# Patient Record
Sex: Male | Born: 1950 | Race: White | Hispanic: No | Marital: Married | Smoking: Former smoker
Health system: Southern US, Community
[De-identification: ages and names within clinical notes are randomized; demographics above are authoritative.]

## PROBLEM LIST (undated history)

## (undated) DIAGNOSIS — E785 Hyperlipidemia, unspecified: Secondary | ICD-10-CM

## (undated) DIAGNOSIS — E039 Hypothyroidism, unspecified: Secondary | ICD-10-CM

## (undated) DIAGNOSIS — Z9289 Personal history of other medical treatment: Secondary | ICD-10-CM

## (undated) DIAGNOSIS — I739 Peripheral vascular disease, unspecified: Secondary | ICD-10-CM

## (undated) DIAGNOSIS — G250 Essential tremor: Secondary | ICD-10-CM

## (undated) DIAGNOSIS — R51 Headache: Secondary | ICD-10-CM

## (undated) DIAGNOSIS — Z87891 Personal history of nicotine dependence: Secondary | ICD-10-CM

## (undated) DIAGNOSIS — Z973 Presence of spectacles and contact lenses: Secondary | ICD-10-CM

## (undated) DIAGNOSIS — R112 Nausea with vomiting, unspecified: Secondary | ICD-10-CM

## (undated) DIAGNOSIS — R519 Headache, unspecified: Secondary | ICD-10-CM

## (undated) DIAGNOSIS — I25810 Atherosclerosis of coronary artery bypass graft(s) without angina pectoris: Secondary | ICD-10-CM

## (undated) DIAGNOSIS — I219 Acute myocardial infarction, unspecified: Secondary | ICD-10-CM

## (undated) DIAGNOSIS — T7840XA Allergy, unspecified, initial encounter: Secondary | ICD-10-CM

## (undated) DIAGNOSIS — K635 Polyp of colon: Secondary | ICD-10-CM

## (undated) DIAGNOSIS — Z9889 Other specified postprocedural states: Secondary | ICD-10-CM

## (undated) DIAGNOSIS — R233 Spontaneous ecchymoses: Secondary | ICD-10-CM

## (undated) DIAGNOSIS — F32A Depression, unspecified: Secondary | ICD-10-CM

## (undated) DIAGNOSIS — K219 Gastro-esophageal reflux disease without esophagitis: Secondary | ICD-10-CM

## (undated) DIAGNOSIS — F419 Anxiety disorder, unspecified: Secondary | ICD-10-CM

## (undated) DIAGNOSIS — J449 Chronic obstructive pulmonary disease, unspecified: Secondary | ICD-10-CM

## (undated) DIAGNOSIS — J439 Emphysema, unspecified: Secondary | ICD-10-CM

## (undated) DIAGNOSIS — M1990 Unspecified osteoarthritis, unspecified site: Secondary | ICD-10-CM

## (undated) DIAGNOSIS — D472 Monoclonal gammopathy: Secondary | ICD-10-CM

## (undated) DIAGNOSIS — N189 Chronic kidney disease, unspecified: Secondary | ICD-10-CM

## (undated) DIAGNOSIS — R238 Other skin changes: Secondary | ICD-10-CM

## (undated) DIAGNOSIS — M519 Unspecified thoracic, thoracolumbar and lumbosacral intervertebral disc disorder: Secondary | ICD-10-CM

## (undated) DIAGNOSIS — N390 Urinary tract infection, site not specified: Secondary | ICD-10-CM

## (undated) DIAGNOSIS — Z8619 Personal history of other infectious and parasitic diseases: Secondary | ICD-10-CM

## (undated) DIAGNOSIS — I10 Essential (primary) hypertension: Secondary | ICD-10-CM

## (undated) HISTORY — DX: Emphysema, unspecified: J43.9

## (undated) HISTORY — DX: Acute myocardial infarction, unspecified: I21.9

## (undated) HISTORY — PX: OTHER SURGICAL HISTORY: SHX169

## (undated) HISTORY — DX: Headache, unspecified: R51.9

## (undated) HISTORY — PX: TONSILLECTOMY: SUR1361

## (undated) HISTORY — DX: Chronic obstructive pulmonary disease, unspecified: J44.9

## (undated) HISTORY — DX: Unspecified thoracic, thoracolumbar and lumbosacral intervertebral disc disorder: M51.9

## (undated) HISTORY — PX: KNEE RECONSTRUCTION: SHX5883

## (undated) HISTORY — DX: Essential (primary) hypertension: I10

## (undated) HISTORY — PX: LUMBAR DISC SURGERY: SHX700

## (undated) HISTORY — PX: ESOPHAGOGASTRODUODENOSCOPY: SHX1529

## (undated) HISTORY — DX: Gastro-esophageal reflux disease without esophagitis: K21.9

## (undated) HISTORY — DX: Allergy, unspecified, initial encounter: T78.40XA

## (undated) HISTORY — PX: PATELLA FRACTURE SURGERY: SHX735

## (undated) HISTORY — DX: Urinary tract infection, site not specified: N39.0

## (undated) HISTORY — DX: Polyp of colon: K63.5

## (undated) HISTORY — DX: Personal history of nicotine dependence: Z87.891

## (undated) HISTORY — DX: Atherosclerosis of coronary artery bypass graft(s) without angina pectoris: I25.810

## (undated) HISTORY — PX: BACK SURGERY: SHX140

## (undated) HISTORY — PX: LAPAROSCOPIC ABLATION RENAL MASS: SUR751

## (undated) HISTORY — DX: Monoclonal gammopathy: D47.2

## (undated) HISTORY — DX: Hyperlipidemia, unspecified: E78.5

## (undated) HISTORY — DX: Essential tremor: G25.0

## (undated) HISTORY — DX: Depression, unspecified: F32.A

## (undated) HISTORY — DX: Headache: R51

---
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
## 1997-08-22 DIAGNOSIS — Z9289 Personal history of other medical treatment: Secondary | ICD-10-CM

## 1997-08-22 DIAGNOSIS — I219 Acute myocardial infarction, unspecified: Secondary | ICD-10-CM

## 1997-08-22 HISTORY — PX: CARDIAC CATHETERIZATION: SHX172

## 1997-08-22 HISTORY — DX: Personal history of other medical treatment: Z92.89

## 1997-08-22 HISTORY — DX: Acute myocardial infarction, unspecified: I21.9

## 1997-09-12 HISTORY — PX: CORONARY ARTERY BYPASS GRAFT: SHX141

## 1999-02-14 ENCOUNTER — Other Ambulatory Visit: Payer: Self-pay

## 2012-11-22 HISTORY — PX: COLONOSCOPY: SHX174

## 2016-06-11 ENCOUNTER — Ambulatory Visit (INDEPENDENT_AMBULATORY_CARE_PROVIDER_SITE_OTHER): Payer: Medicare Other | Admitting: Neurology

## 2016-06-11 ENCOUNTER — Encounter: Payer: Self-pay | Admitting: Neurology

## 2016-06-11 VITALS — BP 120/75 | HR 60 | Ht 69.0 in | Wt 145.0 lb

## 2016-06-11 DIAGNOSIS — G44209 Tension-type headache, unspecified, not intractable: Secondary | ICD-10-CM

## 2016-06-11 DIAGNOSIS — G250 Essential tremor: Secondary | ICD-10-CM

## 2016-06-11 HISTORY — DX: Essential tremor: G25.0

## 2016-06-11 MED ORDER — TOPIRAMATE 50 MG PO TABS: 50.0000 mg | ORAL_TABLET | Freq: Two times a day (BID) | ORAL | Status: DC

## 2016-06-11 NOTE — Progress Notes (Signed)
[facility] [address] [place]. [place] [address] [phone]       OFFICE CONSULT NOTE  Brian. Brian Graham Date of Birth:  [birth year] Medical Record Number:  [id number]   Referring MD:  Hilton Cork Reason for Referral:  Tremor and headaches  HPI: Brian Graham is a 67 year occasion male who is had essential tremor diagnosed 2025 years ago. He describes this as involving mainly his hands but to a lesser degree his head and neck as well. This has been quite mild over the years and hence he was never on specific medications. However he feels the last 2-3 years ago, seemed to have increased. Tremors are intermittent absent at rest. They're more noticeable with activities involving his hands like brushing his teeth, writing or cutting food. These have become severe and now interferes with his routine. He has not noticed any beneficial effect of alcohol on the tremor. Patient has seen a neurologist years ago but not recently. His never been tried on medications like Mysoline, Topamax her Inderal. Last year he has been started on atenolol for cardiac reasons but that has not helped his tremor. He denies any drooling of saliva, bradykinesia, difficulty getting out of the chair. He denies any trouble with balance or walking. Patient also complains of headaches last couple of months. The headaches are mainly towards the back of the head and spread to the side. The dull aching nature intermittent lasting for hours. There is no accompanying nausea, vomiting or light sensitivity. Tylenol does help his headaches but it returns later on. He has been taking 4-6 tablets of Tylenol every day for the last 1 month. He has not had any recent brain imaging study done on scene and neurologist.  ROS:   14 system review of systems is positive for  weight loss, fatigue, cough, easy bruising and bleeding, aching muscles, allergies, headache, dizziness, tremor, decreased energy, change in appetite,  disinterest in activities and all other systems negative PMH:  Past Medical History  Diagnosis Date  . Headache   . Myocardial infarction ([place])   . Lumbar disc disease   . Rhinitis   . COPD (chronic obstructive pulmonary disease) ([place])   . GERD (gastroesophageal reflux disease)   . Abnormal ECG   . Coronary artery disease   . Weight loss   . Colon polyp   . Hyperlipemia     Social History:  Social History   Social History  . Marital Status: Married    Spouse Name: N/A  . Number of Children: N/A  . Years of Education: N/A   Occupational History  . Not on file.   Social History Main Topics  . Smoking status: Current Every Day Smoker -- 0.25 packs/day  . Smokeless tobacco: Not on file  . Alcohol Use: 0.6 oz/week    1 Cans of beer per week     Comment: socially  . Drug Use: Not on file  . Sexual Activity: Not on file   Other Topics Concern  . Not on file   Social History Narrative  . No narrative on file    Medications:   No current outpatient prescriptions on file prior to visit.   No current facility-administered medications on file prior to visit.    Allergies:   Allergies  Allergen Reactions  . Omeprazole Diarrhea    Physical Exam General: well developed, well nourished middle aged caucasian male, seated, in no evident distress Head: head normocephalic and atraumatic.   Neck: supple  with no carotid or supraclavicular bruits Cardiovascular: regular rate and rhythm, no murmurs Musculoskeletal: no deformity Skin:  no rash/petichiae Vascular:  Normal pulses all extremities  Neurologic Exam Mental Status: Awake and fully alert. Oriented to place and time. Recent and remote memory intact. Attention span, concentration and fund of knowledge appropriate. Mood and affect appropriate.  Cranial Nerves: Fundoscopic exam reveals sharp disc margins. Pupils equal, briskly reactive to light. Extraocular movements full without nystagmus. Visual fields full to  confrontation. Hearing intact. Facial sensation intact. Face, tongue, palate moves normally and symmetrically.  Motor: Normal bulk and tone. Normal strength in all tested extremity muscles. Sensory.: intact to touch , pinprick , position and vibratory sensation.  Coordination: Rapid alternating movements normal in all extremities. Finger-to-nose and heel-to-shin performed accurately bilaterally. Gait and Station: Arises from chair without difficulty. Stance is normal. Gait demonstrates normal stride length and balance . Able to heel, toe and tandem walk without difficulty.  Reflexes: 1+ and symmetric. Toes downgoing.       ASSESSMENT: 65 year old Caucasian male with long-standing history of benign essential tremor with recent worsening as well as new onset mild chronic daily headaches likely muscle tension headaches with analgesic rebound    PLAN: I had a long discussion with the patient and his wife regarding his essential head and hand tremor which seems to have worsened recently and is functionally disabling. I recommend trial of Topamax start 50 mg with food at night for 1 week and then increase to twice daily if tolerated. I have discussed possible side effects and asked him to call me. I also encouraged him to discuss with pulmonologist whether he can reduce or stop some of his inhalers which may also be contributing to his tremors. I have advised him to discontinue daily tylenol which he  is taking daily for his tension headaches to diminish analgesic rebound component. Topamax  will hopefully help his headache as well. I encouraged him to do regular neck stretching exercises. He we will check TSH, 123456,: ,metabolic panel labs and check MRI scan of the brain with and without contrast. He was advised to return for follow-up with me in 2 months or call earlier if necessary. Antony Contras, MD  [facility] [address] [place], [place] [address]  Phone  [phone] Fax [phone]  Note: This document was prepared with digital dictation and possible smart phrase technology. Any transcriptional errors that result from this process are unintentional.

## 2016-06-11 NOTE — Patient Instructions (Addendum)
I had a long discussion with the patient and his wife regarding his essential head and hand tremor which seems to have worsened recently and is functionally disabling. I recommend trial of Topamax start 50 mg with food at night for 1 week and then increase to twice daily if tolerated. I have discussed possible side effects and asked him to call me. I also encouraged him to discuss with pulmonologist whether he can reduce or stop some of his inhalers which may also be contributing to his tremors. I have advised him to discontinue daily tylenol which he  is taking daily for his tension headaches to diminish analgesic rebound component. Topamax  will hopefully help his headache as well. I encouraged him to do regular neck stretching exercises. He we will check TSH, 123456,: ,metabolic panel labs and check MRI scan of the brain with and without contrast. He was advised to return for follow-up with me in 2 months or call earlier if necessary.  Essential Tremor A tremor is trembling or shaking that you cannot control. Most tremors affect the hands or arms. Tremors can also affect the head, vocal cords, face, and other parts of the body.  Essential tremor is a tremor without a known cause.  CAUSES Essential tremor has no known cause.  RISK FACTORS You may be at greater risk of essential tremor if:   You have a family member with essential tremor.   You are age 66 or older.   You take certain medicines. SIGNS AND SYMPTOMS The main sign of a tremor is uncontrolled and unintentional rhythmic shaking of a body part.  You may have difficulty eating with a spoon or fork.   You may have difficulty writing.   You may nod your head up and down or side to side.   You may have a quivering voice.  Your tremors:  May get worse over time.   May come and go.   May be more noticeable on one side of your body.   May get worse due to stress, fatigue, caffeine, and extreme heat or cold.   DIAGNOSIS Your health care provider can diagnose essential tremor based on your symptoms, medical history, and a physical examination. There is no single test to diagnose an essential tremor. However, your health care provider may perform a variety of tests to rule out other conditions. Tests may include:   Blood and urine tests.   Imaging studies of your brain, such as:   CT scan.   MRI.   A test that measures involuntary muscle movement (electromyogram). TREATMENT Your tremors may go away without treatment. Mild tremors may not need treatment if they do not affect your day-to-day life. Severe tremors may need to be treated using one or a combination of the following options:   Medicines. This may include medicine that is injected.  Lifestyle changes.   Physical therapy.  HOME CARE INSTRUCTIONS  Take medicines only as directed by your health care provider.   Limit alcohol intake to no more than 1 drink per day for nonpregnant women and 2 drinks per day for men. One drink equals 12 oz of beer, 5 oz of wine, or 1 oz of hard liquor.  Do not use any tobacco products, including cigarettes, chewing tobacco, or electronic cigarettes. If you need help quitting, ask your health care provider.  Take medicines only as directed by your health care provider.   Avoid extreme heat or cold.   Limit the amount of caffeine you  consumeas directed by your health care provider.   Try to get eight hours of sleep each night.  Find ways to manage your stress, such as meditation or yoga.  Keep all follow-up visits as directed by your health care provider. This is important. This includes any physical therapy visits. SEEK MEDICAL CARE IF:  You experience any changes in the location or intensity of your tremors.   You start having a tremor after starting a new medicine.   You have tremor with other symptoms such as:   Numbness.   Tingling.   Pain.   Weakness.    Your tremor gets worse.   Your tremor interferes with your daily life.    This information is not intended to replace advice given to you by your health care provider. Make sure you discuss any questions you have with your health care provider.   Document Released: 11/29/2014 Document Reviewed: 11/29/2014 Elsevier Interactive Patient Education [employer].

## 2016-06-12 LAB — COMPREHENSIVE METABOLIC PANEL
ALBUMIN: 4.4 g/dL (ref 3.6–4.8)
ALT: 18 IU/L (ref 0–44)
AST: 17 IU/L (ref 0–40)
Albumin/Globulin Ratio: 1.8 (ref 1.2–2.2)
Alkaline Phosphatase: 71 IU/L (ref 39–117)
BILIRUBIN TOTAL: 0.9 mg/dL (ref 0.0–1.2)
BUN / CREAT RATIO: 15 (ref 10–24)
BUN: 11 mg/dL (ref 8–27)
CO2: 26 mmol/L (ref 18–29)
CREATININE: 0.73 mg/dL — AB (ref 0.76–1.27)
Calcium: 9.4 mg/dL (ref 8.6–10.2)
Chloride: 97 mmol/L (ref 96–106)
GFR calc Af Amer: 113 mL/min/{1.73_m2} (ref >59)
GFR, EST NON AFRICAN AMERICAN: 97 mL/min/{1.73_m2} (ref >59)
GLUCOSE: 91 mg/dL (ref 65–99)
Globulin, Total: 2.5 g/dL (ref 1.5–4.5)
Potassium: 4.9 mmol/L (ref 3.5–5.2)
SODIUM: 140 mmol/L (ref 134–144)
Total Protein: 6.9 g/dL (ref 6.0–8.5)

## 2016-06-12 LAB — VITAMIN B12: Vitamin B-12: 234 pg/mL (ref 211–946)

## 2016-06-12 LAB — TSH: TSH: 1.71 u[IU]/mL (ref 0.450–4.500)

## 2016-06-17 ENCOUNTER — Telehealth: Payer: Self-pay | Admitting: Neurology

## 2016-06-17 NOTE — Telephone Encounter (Signed)
RN call patient back about the topamax requiring PA. Rn stated usually it does not require a PA because its a first line drug. PT verbalized understanding. Rn stated it takes 3 to 5 business days for a PA to get approve or denied. Pt verbalized understanding.

## 2016-06-17 NOTE — Telephone Encounter (Signed)
PA done on cover my meds for topamax. Key is [id number]. The response time for approval or denial is 3 to 5 business days. PA is pending.

## 2016-06-17 NOTE — Telephone Encounter (Signed)
Patient called, states [facility] has advised Prior Brian Graham is required for TOPAMAX or Dr. Leonie Man will need to prescribe something cheaper that is equivalent. Patient also inquired about open MRI appointment, skyped Danielle who advised she will send this to [facility] for scheduling, advised patient of this and there is a 7-10 day turn around.

## 2016-06-18 NOTE — Telephone Encounter (Signed)
Rn call patient to let him know that the topamax was approve. Pt inquired about open mri. Rn stated a message will be sent to Danielle to get in contact with him.

## 2016-06-18 NOTE — Telephone Encounter (Signed)
I called [facility] PA dept. [phone] spoke to [facility].  PA # [id number] Good from 05-19-16 thru 06-18-2019.

## 2016-06-23 NOTE — Telephone Encounter (Signed)
Patient has been scheduled at GI.

## 2016-06-30 ENCOUNTER — Ambulatory Visit
Admission: RE | Admit: 2016-06-30 | Discharge: 2016-06-30 | Disposition: A | Discharge status: Home / Self Care | Payer: Medicare Other | Source: Ambulatory Visit | Attending: Neurology | Admitting: Neurology

## 2016-06-30 DIAGNOSIS — G250 Essential tremor: Secondary | ICD-10-CM

## 2016-06-30 DIAGNOSIS — G44209 Tension-type headache, unspecified, not intractable: Secondary | ICD-10-CM

## 2016-06-30 MED ORDER — GADOBENATE DIMEGLUMINE 529 MG/ML IV SOLN
13.0000 mL | Freq: Once | INTRAVENOUS | Status: AC | PRN
  Administered 2016-06-30: 13 mL via INTRAVENOUS

## 2016-07-05 ENCOUNTER — Other Ambulatory Visit: Payer: Self-pay | Admitting: Neurology

## 2016-07-05 ENCOUNTER — Telehealth: Payer: Self-pay | Admitting: Neurology

## 2016-07-05 DIAGNOSIS — G250 Essential tremor: Secondary | ICD-10-CM

## 2016-07-05 MED ORDER — PRIMIDONE 250 MG PO TABS: 125.0000 mg | ORAL_TABLET | Freq: Two times a day (BID) | ORAL | 3 refills | Status: DC

## 2016-07-05 NOTE — Telephone Encounter (Signed)
Spoke with Dr.Sethi about pt having side effects of topamax. Pt has been on meds for less than a month. Dr.Sethi reviewed the chart and stated pt needs to stop taking medication for a week and call back in a week. Pt needs to call back in a week to see if the topamax is causing the sympts.This is per Dr.Sethi advice.

## 2016-07-05 NOTE — Telephone Encounter (Signed)
Rn call patient back about the topamax side effects. Pt stated he took for 5 days. While taking the meds he had blurred vision and dizzy spells. Pt is still having the tremors in both his hands. Pt stop taking the medication  3 days ago. Pt stated the blurred vision with the dizzy spells have resolve. Rn stated Dr. Leonie Man will be notified if he wants to prescribed another medication. Pt verbalized understanding.

## 2016-07-05 NOTE — Telephone Encounter (Signed)
Patient called to advise, he's having difficulty with medication topiramate (TOPAMAX) 50 MG tablet, severe dizziness, vision problems. Please call [phone].

## 2016-07-05 NOTE — Telephone Encounter (Signed)
Rn call patient back about Dr.Sethi prescribing Mysoline. Rn stated the medication was sent [facility]. Pt stated it was okay to be [facility]. Rn stated the directions are to take 1/2 for two weeks bid.and after two weeks take 1 tablet twice a day. Pt verbalized understanding.

## 2016-07-05 NOTE — Telephone Encounter (Signed)
Okay to discontinue Topamax since is having side effects. I will prescribe Mysoline instead

## 2016-07-12 ENCOUNTER — Telehealth: Payer: Self-pay

## 2016-07-12 NOTE — Telephone Encounter (Signed)
Garvin Fila, MD  Marval Regal, RN        MRI scan of brain shows only mild nonspecific changes of age related hardening of arteries. No new or worrisome findings

## 2016-07-14 NOTE — Telephone Encounter (Signed)
Rn call patient to let him know that the MRI brain showed mild non specific changes of age related hardening of the arteries. No new or worrisome findings. Pt verbalized understanding.

## 2016-08-05 ENCOUNTER — Encounter (HOSPITAL_COMMUNITY): Payer: Self-pay

## 2016-08-05 ENCOUNTER — Emergency Department (HOSPITAL_COMMUNITY): Payer: Medicare Other

## 2016-08-05 ENCOUNTER — Observation Stay (HOSPITAL_COMMUNITY)
Admission: EM | Admit: 2016-08-05 | Discharge: 2016-08-06 | Disposition: A | Discharge status: Home / Self Care | Payer: Medicare Other | Attending: Student in an Organized Health Care Education/Training Program | Admitting: Student in an Organized Health Care Education/Training Program

## 2016-08-05 DIAGNOSIS — F17200 Nicotine dependence, unspecified, uncomplicated: Secondary | ICD-10-CM | POA: Diagnosis not present

## 2016-08-05 DIAGNOSIS — J441 Chronic obstructive pulmonary disease with (acute) exacerbation: Principal | ICD-10-CM | POA: Diagnosis present

## 2016-08-05 DIAGNOSIS — Z7982 Long term (current) use of aspirin: Secondary | ICD-10-CM | POA: Diagnosis not present

## 2016-08-05 DIAGNOSIS — I10 Essential (primary) hypertension: Secondary | ICD-10-CM

## 2016-08-05 DIAGNOSIS — Z79899 Other long term (current) drug therapy: Secondary | ICD-10-CM | POA: Insufficient documentation

## 2016-08-05 DIAGNOSIS — E039 Hypothyroidism, unspecified: Secondary | ICD-10-CM | POA: Diagnosis not present

## 2016-08-05 DIAGNOSIS — R002 Palpitations: Secondary | ICD-10-CM

## 2016-08-05 DIAGNOSIS — K219 Gastro-esophageal reflux disease without esophagitis: Secondary | ICD-10-CM | POA: Insufficient documentation

## 2016-08-05 DIAGNOSIS — I25810 Atherosclerosis of coronary artery bypass graft(s) without angina pectoris: Secondary | ICD-10-CM | POA: Diagnosis present

## 2016-08-05 DIAGNOSIS — J069 Acute upper respiratory infection, unspecified: Secondary | ICD-10-CM | POA: Diagnosis not present

## 2016-08-05 DIAGNOSIS — R0789 Other chest pain: Secondary | ICD-10-CM | POA: Diagnosis not present

## 2016-08-05 DIAGNOSIS — Z7951 Long term (current) use of inhaled steroids: Secondary | ICD-10-CM | POA: Diagnosis not present

## 2016-08-05 DIAGNOSIS — I252 Old myocardial infarction: Secondary | ICD-10-CM | POA: Diagnosis not present

## 2016-08-05 DIAGNOSIS — I499 Cardiac arrhythmia, unspecified: Secondary | ICD-10-CM | POA: Diagnosis not present

## 2016-08-05 DIAGNOSIS — R000 Tachycardia, unspecified: Secondary | ICD-10-CM

## 2016-08-05 DIAGNOSIS — Z955 Presence of coronary angioplasty implant and graft: Secondary | ICD-10-CM | POA: Diagnosis not present

## 2016-08-05 DIAGNOSIS — Z951 Presence of aortocoronary bypass graft: Secondary | ICD-10-CM

## 2016-08-05 DIAGNOSIS — M1990 Unspecified osteoarthritis, unspecified site: Secondary | ICD-10-CM | POA: Diagnosis not present

## 2016-08-05 DIAGNOSIS — E785 Hyperlipidemia, unspecified: Secondary | ICD-10-CM | POA: Insufficient documentation

## 2016-08-05 DIAGNOSIS — G250 Essential tremor: Secondary | ICD-10-CM | POA: Diagnosis present

## 2016-08-05 HISTORY — DX: Atherosclerosis of coronary artery bypass graft(s) without angina pectoris: I25.810

## 2016-08-05 HISTORY — DX: Essential (primary) hypertension: I10

## 2016-08-05 HISTORY — DX: Unspecified osteoarthritis, unspecified site: M19.90

## 2016-08-05 HISTORY — DX: Personal history of other infectious and parasitic diseases: Z86.19

## 2016-08-05 HISTORY — DX: Personal history of other medical treatment: Z92.89

## 2016-08-05 HISTORY — DX: Hypothyroidism, unspecified: E03.9

## 2016-08-05 LAB — BRAIN NATRIURETIC PEPTIDE: B Natriuretic Peptide: 48.3 pg/mL (ref 0.0–100.0)

## 2016-08-05 LAB — CBC WITH DIFFERENTIAL/PLATELET
Basophils Absolute: 0 10*3/uL (ref 0.0–0.1)
Basophils Relative: 0 %
EOS ABS: 0.1 10*3/uL (ref 0.0–0.7)
Eosinophils Relative: 0 %
HCT: 45.3 % (ref 39.0–52.0)
HEMOGLOBIN: 15 g/dL (ref 13.0–17.0)
LYMPHS ABS: 0.9 10*3/uL (ref 0.7–4.0)
Lymphocytes Relative: 8 %
MCH: 32.1 pg (ref 26.0–34.0)
MCHC: 33.1 g/dL (ref 30.0–36.0)
MCV: 96.8 fL (ref 78.0–100.0)
MONOS PCT: 5 %
Monocytes Absolute: 0.5 10*3/uL (ref 0.1–1.0)
NEUTROS PCT: 87 %
Neutro Abs: 9.6 10*3/uL — ABNORMAL HIGH (ref 1.7–7.7)
Platelets: 232 10*3/uL (ref 150–400)
RBC: 4.68 MIL/uL (ref 4.22–5.81)
RDW: 14.7 % (ref 11.5–15.5)
WBC: 11.1 10*3/uL — ABNORMAL HIGH (ref 4.0–10.5)

## 2016-08-05 LAB — BASIC METABOLIC PANEL
Anion gap: 10 (ref 5–15)
BUN: 9 mg/dL (ref 6–20)
CHLORIDE: 103 mmol/L (ref 101–111)
CO2: 25 mmol/L (ref 22–32)
CREATININE: 0.94 mg/dL (ref 0.61–1.24)
Calcium: 9 mg/dL (ref 8.9–10.3)
GFR calc Af Amer: >60 mL/min (ref >60)
GFR calc non Af Amer: >60 mL/min (ref >60)
GLUCOSE: 130 mg/dL — AB (ref 65–99)
Potassium: 4 mmol/L (ref 3.5–5.1)
SODIUM: 138 mmol/L (ref 135–145)

## 2016-08-05 LAB — GLUCOSE, CAPILLARY: Glucose-Capillary: 240 mg/dL — ABNORMAL HIGH (ref 65–99)

## 2016-08-05 LAB — I-STAT TROPONIN, ED: TROPONIN I, POC: 0 ng/mL (ref 0.00–0.08)

## 2016-08-05 LAB — TROPONIN I: Troponin I: <0.03 ng/mL (ref <0.03)

## 2016-08-05 MED ORDER — BENAZEPRIL HCL 20 MG PO TABS
20.0000 mg | ORAL_TABLET | Freq: Every day | ORAL | Status: DC
  Administered 2016-08-06: 20 mg via ORAL
  Filled 2016-08-05: qty 1

## 2016-08-05 MED ORDER — ALBUTEROL SULFATE (2.5 MG/3ML) 0.083% IN NEBU: 3.0000 mL | INHALATION_SOLUTION | RESPIRATORY_TRACT | Status: DC | PRN

## 2016-08-05 MED ORDER — ASPIRIN EC 81 MG PO TBEC
81.0000 mg | DELAYED_RELEASE_TABLET | Freq: Every day | ORAL | Status: DC
  Administered 2016-08-05 – 2016-08-06 (×2): 81 mg via ORAL
  Filled 2016-08-05 (×2): qty 1

## 2016-08-05 MED ORDER — ENSURE ENLIVE PO LIQD
237.0000 mL | Freq: Two times a day (BID) | ORAL | Status: DC
  Administered 2016-08-06: 237 mL via ORAL

## 2016-08-05 MED ORDER — ATORVASTATIN CALCIUM 80 MG PO TABS
80.0000 mg | ORAL_TABLET | Freq: Every day | ORAL | Status: DC
  Administered 2016-08-05: 80 mg via ORAL
  Filled 2016-08-05: qty 1

## 2016-08-05 MED ORDER — ACETAMINOPHEN 650 MG RE SUPP: 650.0000 mg | Freq: Four times a day (QID) | RECTAL | Status: DC | PRN

## 2016-08-05 MED ORDER — MOMETASONE FURO-FORMOTEROL FUM 200-5 MCG/ACT IN AERO
2.0000 | INHALATION_SPRAY | Freq: Two times a day (BID) | RESPIRATORY_TRACT | Status: DC
  Administered 2016-08-05 – 2016-08-06 (×2): 2 via RESPIRATORY_TRACT
  Filled 2016-08-05 (×2): qty 8.8

## 2016-08-05 MED ORDER — ENOXAPARIN SODIUM 40 MG/0.4ML ~~LOC~~ SOLN: 40.0000 mg | SUBCUTANEOUS | Status: DC

## 2016-08-05 MED ORDER — ALBUTEROL SULFATE (2.5 MG/3ML) 0.083% IN NEBU
5.0000 mg | INHALATION_SOLUTION | Freq: Once | RESPIRATORY_TRACT | Status: AC
  Administered 2016-08-05: 2.5 mg via RESPIRATORY_TRACT
  Filled 2016-08-05: qty 6

## 2016-08-05 MED ORDER — IPRATROPIUM-ALBUTEROL 0.5-2.5 (3) MG/3ML IN SOLN
3.0000 mL | Freq: Three times a day (TID) | RESPIRATORY_TRACT | Status: DC
  Administered 2016-08-06: 3 mL via RESPIRATORY_TRACT
  Filled 2016-08-05: qty 3

## 2016-08-05 MED ORDER — ACETAMINOPHEN 325 MG PO TABS
650.0000 mg | ORAL_TABLET | Freq: Four times a day (QID) | ORAL | Status: DC | PRN
Start: 2016-08-05 — End: 2016-08-06

## 2016-08-05 MED ORDER — DOXYCYCLINE HYCLATE 100 MG PO TABS
100.0000 mg | ORAL_TABLET | Freq: Once | ORAL | Status: AC
  Administered 2016-08-05: 100 mg via ORAL
  Filled 2016-08-05: qty 1

## 2016-08-05 MED ORDER — FLUTICASONE PROPIONATE 50 MCG/ACT NA SUSP
1.0000 | Freq: Every day | NASAL | Status: DC
  Filled 2016-08-05: qty 16

## 2016-08-05 MED ORDER — GUAIFENESIN-DM 100-10 MG/5ML PO SYRP: 5.0000 mL | ORAL_SOLUTION | ORAL | Status: DC | PRN

## 2016-08-05 MED ORDER — IPRATROPIUM-ALBUTEROL 0.5-2.5 (3) MG/3ML IN SOLN
3.0000 mL | Freq: Once | RESPIRATORY_TRACT | Status: DC
  Filled 2016-08-05: qty 3

## 2016-08-05 MED ORDER — PREDNISONE 20 MG PO TABS
60.0000 mg | ORAL_TABLET | Freq: Once | ORAL | Status: AC
  Administered 2016-08-05: 60 mg via ORAL
  Filled 2016-08-05: qty 3

## 2016-08-05 MED ORDER — AZITHROMYCIN 500 MG PO TABS
500.0000 mg | ORAL_TABLET | Freq: Every day | ORAL | Status: AC
  Administered 2016-08-06: 500 mg via ORAL
  Filled 2016-08-05: qty 1

## 2016-08-05 MED ORDER — LEVOTHYROXINE SODIUM 25 MCG PO TABS
125.0000 ug | ORAL_TABLET | Freq: Every day | ORAL | Status: DC
  Administered 2016-08-06: 125 ug via ORAL
  Filled 2016-08-05: qty 1

## 2016-08-05 MED ORDER — PREDNISONE 20 MG PO TABS
40.0000 mg | ORAL_TABLET | Freq: Every day | ORAL | Status: DC
  Administered 2016-08-06: 40 mg via ORAL
  Filled 2016-08-05: qty 2

## 2016-08-05 MED ORDER — AZITHROMYCIN 500 MG PO TABS: 250.0000 mg | ORAL_TABLET | Freq: Every day | ORAL | Status: DC

## 2016-08-05 MED ORDER — IPRATROPIUM-ALBUTEROL 0.5-2.5 (3) MG/3ML IN SOLN
3.0000 mL | Freq: Four times a day (QID) | RESPIRATORY_TRACT | Status: DC
  Administered 2016-08-05: 3 mL via RESPIRATORY_TRACT
  Filled 2016-08-05: qty 3

## 2016-08-05 MED ORDER — SODIUM CHLORIDE 0.9 % IV BOLUS (SEPSIS)
1000.0000 mL | Freq: Once | INTRAVENOUS | Status: AC
  Administered 2016-08-05: 1000 mL via INTRAVENOUS

## 2016-08-05 MED ORDER — AMLODIPINE BESYLATE 5 MG PO TABS
5.0000 mg | ORAL_TABLET | Freq: Every day | ORAL | Status: DC
  Administered 2016-08-05 – 2016-08-06 (×2): 5 mg via ORAL
  Filled 2016-08-05 (×2): qty 1

## 2016-08-05 MED ORDER — ATENOLOL 50 MG PO TABS
50.0000 mg | ORAL_TABLET | Freq: Every day | ORAL | Status: DC
  Administered 2016-08-06: 50 mg via ORAL
  Filled 2016-08-05: qty 1

## 2016-08-05 MED ORDER — TIOTROPIUM BROMIDE MONOHYDRATE 18 MCG IN CAPS: 18.0000 ug | ORAL_CAPSULE | Freq: Every day | RESPIRATORY_TRACT | Status: DC

## 2016-08-05 MED ORDER — HYDROCODONE-ACETAMINOPHEN 5-325 MG PO TABS
1.0000 | ORAL_TABLET | Freq: Once | ORAL | Status: AC
  Administered 2016-08-05: 1 via ORAL
  Filled 2016-08-05: qty 1

## 2016-08-05 MED ORDER — ACETAMINOPHEN 500 MG PO TABS: 1000.0000 mg | ORAL_TABLET | Freq: Four times a day (QID) | ORAL | Status: DC | PRN

## 2016-08-05 MED ORDER — GUAIFENESIN ER 600 MG PO TB12
600.0000 mg | ORAL_TABLET | Freq: Two times a day (BID) | ORAL | Status: DC
  Filled 2016-08-05 (×2): qty 1

## 2016-08-05 NOTE — ED Provider Notes (Signed)
[facility] DEPT Provider Note   CSN: [id number] Arrival date & time: 08/05/16  0950     History   Chief Complaint Chief Complaint  Patient presents with  . Shortness of Breath    HPI Brian Graham is a 65 y.o. male.  The history is provided by the patient.  Shortness of Breath  This is a new problem. The problem occurs continuously.The current episode started 12 to 24 hours ago. Associated symptoms include a fever (subjective) and cough. Pertinent negatives include no chest pain, no vomiting, no abdominal pain and no leg swelling.    65 year old male who presents with cough and shortness of breath. He has history of COPD, hypertension, hyperlipidemia, CAD status post CABG. Gradual onset of cold like symptoms 3 days ago, including stuffy head, dry throat, and congestion. Increased sinus drainage, cough and shortness of breath yesterday. Last night coughing up thick mucous, and with subjective fever, chills and night sweats. Overnight with increased dyspnea, and used albuterol nebulizer, with temporary and transient improvement in shortness of breath. q2h breathing treatments last night with brief improvement. No chest pain, but soreness whenever he cough. No home oxygen.  Past Medical History:  Diagnosis Date  . Abnormal ECG   . Colon polyp   . COPD (chronic obstructive pulmonary disease) ([place])   . Coronary artery disease   . GERD (gastroesophageal reflux disease)   . Headache   . Hyperlipemia   . Lumbar disc disease   . Myocardial infarction ([place])   . Rhinitis   . Weight loss     Patient Active Problem List   Diagnosis Date Noted  . Acute exacerbation of chronic obstructive pulmonary disease (COPD) ([place]) 08/05/2016  . Essential tremor 06/11/2016  . Tension headache 06/11/2016    Past Surgical History:  Procedure Laterality Date  . BACK SURGERY    . heart surgry    . left knee surgrry         Home Medications    Prior to Admission medications     Medication Sig Start Date End Date Taking? Authorizing Provider  acetaminophen (TYLENOL) 500 MG tablet Take 1,000 mg by mouth every 6 (six) hours as needed for mild pain.   Yes Historical Provider, MD  amLODipine (NORVASC) 5 MG tablet  11/20/15  Yes Historical Provider, MD  aspirin 325 MG tablet    Yes Historical Provider, MD  atenolol (TENORMIN) 50 MG tablet  05/10/16  Yes Historical Provider, MD  atorvastatin (LIPITOR) 80 MG tablet  05/10/16  Yes Historical Provider, MD  benazepril (LOTENSIN) 20 MG tablet  05/26/16  Yes Historical Provider, MD  fluticasone Asencion Islam) 50 MCG/ACT nasal spray  03/09/16  Yes Historical Provider, MD  PROAIR HFA [phone] Base) MCG/ACT inhaler  05/28/16  Yes Historical Provider, MD  [facility] 160-4.5 MCG/ACT inhaler  05/19/16  Yes Historical Provider, MD  SYNTHROID 125 MCG tablet  05/10/16  Yes Historical Provider, MD  tiotropium (SPIRIVA HANDIHALER) 18 MCG inhalation capsule  08/06/14  Yes Historical Provider, MD  primidone (MYSOLINE) 250 MG tablet Take 0.5 tablets (125 mg total) by mouth 2 (two) times daily. Patient not taking: Reported on 08/05/2016 07/05/16   Garvin Fila, MD    Family History No family history on file.  Social History Social History  Substance Use Topics  . Smoking status: Current Every Day Smoker    Packs/day: 0.25  . Smokeless tobacco: Never Used  . Alcohol use 0.6 oz/week    1 Cans of beer per week  Comment: socially     Allergies   Omeprazole   Review of Systems Review of Systems  Constitutional: Positive for chills and fever (subjective).  HENT: Positive for congestion.   Respiratory: Positive for cough and shortness of breath.   Cardiovascular: Negative for chest pain and leg swelling.  Gastrointestinal: Negative for abdominal pain, diarrhea, nausea and vomiting.  Genitourinary: Negative for difficulty urinating.  Psychiatric/Behavioral: Negative for confusion.  All other systems reviewed and are negative.    Physical  Exam Updated Vital Signs BP 111/57   Pulse 96   Temp 99.5 F (37.5 C) (Oral)   Resp (!) 27   Ht 5\' 9"  (1.753 m)   Wt 145 lb (65.8 kg)   SpO2 93%   BMI 21.41 kg/m   Physical Exam Physical Exam  Nursing note and vitals reviewed. Constitutional: Well developed, well nourished, non-toxic, and in no acute distress Head: Normocephalic and atraumatic.  Mouth/Throat: Oropharynx is clear and moist.  Neck: Normal range of motion. Neck supple.  Eyes: normal conjunctiva Cardiovascular: Tachycardic rate and regular rhythm.   Pulmonary/Chest: Effort normal and breath sounds with scattered wheezing. no LE edema Abdominal: Soft. There is no tenderness. There is no rebound and no guarding.  Musculoskeletal: No calf tenderness Neurological: Alert, no facial droop, fluent speech, moves all extremities symmetrically Skin: Skin is warm and dry.  Psychiatric: Cooperative   ED Treatments / Results  Labs (all labs ordered are listed, but only abnormal results are displayed) Labs Reviewed  CBC WITH DIFFERENTIAL/PLATELET - Abnormal; Notable for the following:       Result Value   WBC 11.1 (*)    Neutro Abs 9.6 (*)    All other components within normal limits  BASIC METABOLIC PANEL - Abnormal; Notable for the following:    Glucose, Bld 130 (*)    All other components within normal limits  BRAIN NATRIURETIC PEPTIDE  I-STAT TROPOININ, ED    EKG  EKG Interpretation  Date/Time:  Thursday August 05 2016 09:57:39 EDT Ventricular Rate:  117 PR Interval:    QRS Duration: 95 QT Interval:  318 QTC Calculation: 444 R Axis:   77 Text Interpretation:  Sinus tachycardia RAE, consider biatrial enlargement Anteroseptal infarct, age indeterminate No prior EKG for comparison  Confirmed by Bekim Werntz MD, Lorelie Biermann [phone]) on 08/05/2016 10:05:54 AM       Radiology Dg Chest 2 View  Result Date: 08/05/2016 CLINICAL DATA:  Shortness of breath since last night. EXAM: CHEST  2 VIEW COMPARISON:  None. FINDINGS:  Previous median sternotomy. Normal heart size and mediastinal contours. Hyperinflation and emphysematous changes. There is no edema, consolidation, effusion, or pneumothorax. No acute osseous finding. IMPRESSION: 1. No evidence of acute disease. 2. COPD. Electronically Signed   By: Monte Fantasia M.D.   On: 08/05/2016 10:45    Procedures Procedures (including critical care time)  Medications Ordered in ED Medications  albuterol (PROVENTIL) (2.5 MG/3ML) 0.083% nebulizer solution 5 mg (2.5 mg Nebulization Given 08/05/16 1115)  HYDROcodone-acetaminophen (NORCO/VICODIN) 5-325 MG per tablet 1 tablet (1 tablet Oral Given 08/05/16 1103)  predniSONE (DELTASONE) tablet 60 mg (60 mg Oral Given 08/05/16 1104)  sodium chloride 0.9 % bolus 1,000 mL (1,000 mLs Intravenous New Bag/Given 08/05/16 1103)  doxycycline (VIBRA-TABS) tablet 100 mg (100 mg Oral Given 08/05/16 1253)     Initial Impression / Assessment and Plan / ED Course  I have reviewed the triage vital signs and the nursing notes.  Pertinent labs & imaging results that were available during  my care of the patient were reviewed by me and considered in my medical decision making (see chart for details).  Clinical Course   Patient received 2 albuterol treatments prior to my evaluation and he appears comfortable, speaking in full sentences, with scattered wheeze. Received Atrovent, 60 mg prednisone, 100 mg doxycycline. Chest x-ray without infiltrate. Initially tachycardic, but I suspect that this is from every 2 hours albuterol treatments at home, improving with IV fluids. Is afebrile, hemodynamically stable. Does have a pulse ox of 88-90 while at rest occasionally and has a 2 L oxygen requirement even after breathing treatments. EKG is nonischemic and troponin is negative. Does not appear fluid overloaded. Given his oxygen requirement will admit for observation. Admitted to family medicine teaching service.  Final Clinical Impressions(s) / ED Diagnoses    Final diagnoses:  Acute exacerbation of chronic obstructive pulmonary disease (COPD) ([place])    New Prescriptions New Prescriptions   No medications on file     Forde Dandy, MD 08/05/16 1416

## 2016-08-05 NOTE — ED Notes (Signed)
Pt. Given 2L o2 for sats in the low 90's

## 2016-08-05 NOTE — ED Triage Notes (Signed)
Pt. C/O SOB that started last night. Pt. sts that he felt febrile last night with some SOB that has been getting worse since he noticed he had a cold. Tylenol last night for fever, home nebulizer this AM when EMS arrived. 125 solumedrol, and 2 nebs in route. Bilateral rhonchi and ex. Wheezing with a productive cough

## 2016-08-05 NOTE — ED Notes (Signed)
Report attempted to [facility]

## 2016-08-05 NOTE — H&P (Signed)
Date: 08/05/2016               Patient Name:  Brian Graham MRN: [id number]  DOB: [birth year] Age / Sex: 65 y.o., male   PCP: Angelica Chessman, MD         Medical Service: Internal Medicine Teaching Service         Attending Physician: Dr. Axel Filler, MD    First Contact: Cecil Cranker, MS3  Pager: [phone]  Second Contact: Dr. Inda Castle Pager: [phone]  Third Contact  Dr. Quay Burow Pager: [phone]  After Hours (After 5p/  First Contact Pager: [phone]  weekends / holidays): Second Contact Pager: [phone]        Chief Complaint: shortness of breath  History of Present Illness: Brian Graham is a 65 year old man with COPD, CAD (s/p CABG 1998), and ongoing tobacco abuse who presents with acute dyspnea following upper respiratory symptoms.  For the past 5 days, he has had rhinorrhea, nasal and chest congestion, and cough productive of greenish sputum.  He has a baseline non-productive cough.  Last night, he began to feel short of breath, with associated chills and diaphoresis.  He used albuterol nebulizers at home 3 times overnight, which provided temporary relief of his shortness of breath, but his symptoms recurred and worsened.  This morning, his wife said he was struggling to breathe, wheezing loudly, and looking a pale ashen color, prompting her to call EMS.  Since reaching the ED and receiving breathing treatments, he feels much improved.  No chest pain, dyspnea prior to last night, orthopnea, PND, or edema.  Has been using his Symbicort and Spiriva as prescribed.  No history of childhood asthma.  He was hospitalized once with a COPD exacerbation 2 years ago when he was initially diagnosed.  Said he did have outpatient PFTs afterwards.  Meds:  Current Meds  Medication Sig  . acetaminophen (TYLENOL) 500 MG tablet Take 1,000 mg by mouth every 6 (six) hours as needed for mild pain.  Marland Kitchen amLODipine (NORVASC) 5 MG tablet   . aspirin 325 MG tablet   . atenolol (TENORMIN) 50 MG  tablet   . atorvastatin (LIPITOR) 80 MG tablet   . benazepril (LOTENSIN) 20 MG tablet   . fluticasone (FLONASE) 50 MCG/ACT nasal spray   . PROAIR HFA 108 (90 Base) MCG/ACT inhaler   . SYMBICORT 160-4.5 MCG/ACT inhaler   . SYNTHROID 125 MCG tablet   . tiotropium (SPIRIVA HANDIHALER) 18 MCG inhalation capsule      Allergies: Allergies as of 08/05/2016 - Review Complete 08/05/2016  Allergen Reaction Noted  . Omeprazole Diarrhea 06/11/2016   Past Medical History:  Diagnosis Date  . Abnormal ECG   . Arthritis    "left knee" (08/05/2016)  . Benign essential tremor    "all my life" (08/05/2016)  . Colon polyp   . COPD (chronic obstructive pulmonary disease) ([place])   . Coronary artery disease   . GERD (gastroesophageal reflux disease)   . Headache   . History of blood transfusion 08/1997   "when he had his heart surgery"  . History of shingles 1970-2013 X 3  . Hyperlipemia   . Hypothyroidism   . Lumbar disc disease   . Myocardial infarction ([place]) 08/1997  . Rhinitis   . Weight loss     Family History: Mother and Father: Alcoholism  Social History:  Tobacco Use: 1/2 pack per day for 50 years Alcohol Use: 2 alcoholic beverages per night Illicit Drug Use:  Denies  Review of Systems: A complete ROS was negative except as per HPI.  Physical Exam: Blood pressure 121/81, pulse 87, temperature 98.1 F (36.7 C), temperature source Oral, resp. rate 18, height 5\' 9"  (1.753 m), weight 145 lb (65.8 kg), SpO2 97 %.  Physical Exam  Constitutional: He is oriented to person, place, and time. He appears well-developed and well-nourished. No distress.  Eyes: Conjunctivae are normal. No scleral icterus.  Neck: Normal range of motion. Neck supple.  Cardiovascular:  Irregular rhythm, normal S1 and S2  Pulmonary/Chest:  No respiratory distress, tachypnea, or accessory muscle usages Rare wheezes  Abdominal: Soft. He exhibits no distension. There is no tenderness.  Musculoskeletal: He  exhibits no edema or tenderness.  Neurological: He is alert and oriented to person, place, and time.  Skin: Skin is warm and dry.  Psychiatric: He has a normal mood and affect. His behavior is normal.   CBC Latest Ref Rng & Units 08/05/2016  WBC 4.0 - 10.5 K/uL 11.1(H)  Hemoglobin 13.0 - 17.0 g/dL 15.0  Hematocrit 39.0 - 52.0 % 45.3  Platelets 150 - 400 K/uL 232   BMP Latest Ref Rng & Units 08/05/2016 06/11/2016  Glucose 65 - 99 mg/dL 130(H) 91  BUN 6 - 20 mg/dL 9 11  Creatinine 0.61 - 1.24 mg/dL 0.94 0.73(L)  BUN/Creat Ratio 10 - 24 - 15  Sodium 135 - 145 mmol/L 138 140  Potassium 3.5 - 5.1 mmol/L 4.0 4.9  Chloride 101 - 111 mmol/L 103 97  CO2 22 - 32 mmol/L 25 26  Calcium 8.9 - 10.3 mg/dL 9.0 9.4   BNP (last 3 results)  Recent Labs  08/05/16 1024  BNP 48.3   Troponin (Point of Care Test)  Recent Labs  08/05/16 1122  TROPIPOC 0.00    EKG: sinus tach, Q waves in aVL, V1, V2, TWI in aVL, no ST changes.  No priors.  CXR: hyperinflation, s/p median sternotomy,  no acute disease  Assessment & Plan by Problem: Principal Problem:   Acute exacerbation of chronic obstructive pulmonary disease (COPD) (HCC) Active Problems:   Essential tremor   Hx of CABG   CAD (coronary artery disease) of artery bypass graft   HTN (hypertension)  #Dyspnea #COPD Exacerbation His dyspnea, worse cough, and increased sputum production following upper respiratory symptoms likely represent a COPD exacerbation in this current smoker.  No fevers or consolidation to suggest pneumonia.  He does have a history of CAD, increasing concern for cardiac etiologies.  Low concern for PE with gradual onset and URI symptoms.  Received prednisone 60 mg and doxycycline in the ED and has responded well so far. -Trend troponins -Duonebs q6h -Prednisone 40 daily to complete 5 days of steroids -Azithromycin for 5 days -Flonase and guafenisin -Wean O2 -Ambulatory O2 sats tomorrow  #HTN -Continue home  amlodipine, benazepril, and atenolol.  #Irregular Heart Rhythm EKG in ED showed sinus tach.  No history of AF per patient.  Occasionally has palpitations -Telemetry  #Hypothyroidism TSH normal less than 1 month ago. -Continue home synthroid   Dispo: Admit patient to Observation with expected length of stay less than 2 midnights.  Signed: Minus Liberty, MD 08/05/2016, 5:37 PM  Pager: [phone]

## 2016-08-05 NOTE — ED Notes (Signed)
Report called to Jac Canavan RN

## 2016-08-05 NOTE — ED Notes (Signed)
Pt. CO feeling hot, he feels warm, temp is 98.7  We ambulated in the hallway without O2, sats dropped to 91% Pt. sts he feels fine, just a little tired

## 2016-08-05 NOTE — H&P (Signed)
Date: 08/05/2016               Patient Name:  Brian Graham MRN: [id number]  DOB: [birth year] Age / Sex: 65 y.o., male   PCP: Angelica Chessman, MD              Medical Service: Internal Medicine Teaching Service              Attending Physician: Dr. Axel Filler, MD     Chief Complaint: shortness of breath  History of Present Illness:  Mr. Jens Siems, a 65 year old with a history of COPD and CAD, presents with increased shortness of breath. Patient reports that both he and his wife have had a cold-like illness for the past few days, which increased his baseline level of post-nasal drip, along with "itchy throat, itchy ears," and headache. This triggered coughing productive of green mucus. He reports a sensation of chest tightness preceding each cough, but states that this is relieved with mucus expectoration. He denies chest pain. Last night, he felt as though he could not catch his breath, so he tried his home breathing treatments. This provided roughly 3 hours of relief, but the shortness of breath repeated 2 more times during the night. He denies this ever occurring before. When it occurred again in the morning, he decided to come to the hospital.  Patient reports that he is functionally non-limited at baseline, able to climb flights of stairs with no problem and he is not on oxygen at home. He reports a dry cough that occurs infrequently at baseline.  Meds: COPD: Symbicort bid, spiriva daily, Proair as needed CAD: Aspirin, Atenolol, Benazepril, atorvastatin Hypothyroid: Synthroid   Allergies: Omeprazole: diarrhea  Past Medical History: COPD: diagnosed 2 years ago, non-oxygen dependent, no prior exacerbations CAD: triple bypass 18 years ago following MI, no recurrence Hypothyroidism  Past Surgical History: Triple bypass 1999 Kidney surgery for abscess 2015 6 knee surgeries during childhood (sports injuries)  Family History: Father died of cancer (unknown);  father and mother both alcoholics. No hypertension, diabetes, heart problems reported.  Social History: Patient recently moved to [place] from [place] (but originally from [place]) with his wife to be closer to his daughter. He is able to carry out all of his activities of daily living. 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   He reports a long smoking history, 50+ years, during which for many years he smoked 2 packs per day. He now smokes 1/2 pack per day. He drinks infrequently and denies any drug use.   Review of Systems: Positive for dyspnea, chest tightness, productive cough, congestion, sweating Negative for nausea, vomiting, diarrhea, chest pain  Physical Exam: Blood pressure 98/69, pulse 93, temperature 99.5 F (37.5 C), temperature source Oral, resp. rate 26, height 5\' 9"  (1.753 m), weight 145 lb (65.8 kg), SpO2 97 %.  General: pleasant man sitting up in bed, in no acute distress, conversant Psych: alert and oriented to person, place, time; speech fluent, logical, and directed HEENT:  light yellow scrape-able plaques on tongue and palate Neuro: PERRL, EOM grossly intact, 5/5 strength throughout extremities, bilateral coarse hand tremor with activity Neck: supple; no thyromegaly, masses or lymphadenopathy Resp: coarse breath sounds bilaterally, diminished air movement, no wheezing, no crackles CV: tachycardic, no MRG Abdomen: soft, nontender, bowel sounds normoactive Extremities: no edema, good pulses Skin: warm and dry  Lab results: BMP: Na 138, K 4.0, Cl 103, CO2 25, BUN 9, Cr. Marland Kitchen  94, Ca 9.0, Glucose 130, Anion Gap 10 CBC: WBC 11.1, Hgb 15, Hct 45.3, Plts 232 BNP: 48.3 Troponin: 0.00  Imaging results:  Chest Xray (2 views): Evidence of hyperinflation; no focal consolidations or effusions noted; wires visible from sternotomy from triple bypass.  Other results: EKG: sinus tachycardia, Q waves from V4-6  Assessment & Plan by  Problem: Principal Problem:   Acute exacerbation of chronic obstructive pulmonary disease (COPD) ([place])  Mr. Tokarz appears to be having a severe COPD exacerbation given his presentation with increased sputum volume and purulence, and increased dyspnea. His presenting respiratory rate of 26 and tachycardia on EKG of 117bm are further evidence of the severity of his exacerbation; he therefore warrants hospitalization for treatment initiation, inclusive of steroids and antibiotics. His recent upper respiratory infection provides a likely trigger for the current episode.   His mention of chest tightness along with his distant history of MI raises the possibility of an acute coronary syndrome, but this is less likely given his normal troponin levels and EKG finding no acute ischemic changes. We will, however, follow two more troponin levels to ensure that this is an isolated COPD exacerbation.  Treatment: --Prednisone 40mg  --Duonebs q6 hrs --Oxygen at 2L/min to maintain sats >90%; goal to wean off oxygen tomorrow (ambulate and reassess) --Azithromycin 500mg  -Repeat troponin q8hrs twice  This is a Careers information officer Note.  The care of the patient was discussed with Dr. Quay Burow and the assessment and plan was formulated with their assistance.  Please see their note for official documentation of the patient encounter.   Signed: Glendale Chard, Medical Student 08/05/2016, 3:24 PM

## 2016-08-05 NOTE — Discharge Summary (Signed)
Name: Brian Graham MRN: [id number] DOB: [birth year] 65 y.o. PCP: Angelica Chessman, MD  Date of Admission: 08/05/2016  9:50 AM Date of Discharge: 08/06/2016 Attending Physician: Axel Filler, MD  Discharge Diagnosis: 1. COPD Exacerbation  Principal Problem:   Acute exacerbation of chronic obstructive pulmonary disease (COPD) ([place]) Active Problems:   Essential tremor   Hx of CABG   CAD (coronary artery disease) of artery bypass graft   HTN (hypertension)   Discharge Medications:   Medication List    STOP taking these medications   aspirin 325 MG tablet Replaced by:  aspirin 81 MG EC tablet     TAKE these medications   acetaminophen 500 MG tablet Commonly known as:  TYLENOL Take 1,000 mg by mouth every 6 (six) hours as needed for mild pain.   amLODipine 5 MG tablet Commonly known as:  NORVASC   aspirin 81 MG EC tablet Take 1 tablet (81 mg total) by mouth daily. Start taking on:  08/07/2016 Replaces:  aspirin 325 MG tablet   atenolol 50 MG tablet Commonly known as:  TENORMIN   atorvastatin 80 MG tablet Commonly known as:  LIPITOR   azithromycin 250 MG tablet Commonly known as:  ZITHROMAX Take 1 tablet (250 mg total) by mouth daily. Start taking on:  08/07/2016   benazepril 20 MG tablet Commonly known as:  LOTENSIN   fluticasone 50 MCG/ACT nasal spray Commonly known as:  FLONASE   predniSONE 20 MG tablet Commonly known as:  DELTASONE Take 2 tablets (40 mg total) by mouth daily with breakfast. Start taking on:  08/07/2016   primidone 250 MG tablet Commonly known as:  MYSOLINE Take 0.5 tablets (125 mg total) by mouth 2 (two) times daily.   PROAIR HFA 108 (90 Base) MCG/ACT inhaler Generic drug:  albuterol   SPIRIVA HANDIHALER 18 MCG inhalation capsule Generic drug:  tiotropium   SYMBICORT 160-4.5 MCG/ACT inhaler Generic drug:  budesonide-formoterol   SYNTHROID 125 MCG tablet Generic drug:  levothyroxine       Disposition and  follow-up:   Brian Graham was discharged from [facility] in Good condition.  At the hospital follow up visit please address:  1.  COPD.  Breathing returned to baseline?  On appropriate controllers?  O2 sats with ambulation.  2.  Labs / imaging needed at time of follow-up: none  3.  Pending labs/ test needing follow-up: none  Follow-up Appointments: Follow-up Information    Brian Graham. Schedule an appointment as soon as possible for a visit in 1 week(s).   Why:  The clinic will call you to schedule an appointment in 7-10 days. Contact information: [address] [place] [facility] Course by problem list: Principal Problem:   Acute exacerbation of chronic obstructive pulmonary disease (COPD) ([place]) Active Problems:   Essential tremor   Hx of CABG   CAD (coronary artery disease) of artery bypass graft   HTN (hypertension)   1. COPD Exacerbation Brian Graham presented with subacute symptoms of rhinorrhea, productive cough, and chest congestion.  On the day prior to admission, several days after the onset of URI symptoms, he noticed some shortness of breath and chest tightness.  Took his home albuterol with transient relief of symptoms, but by the morning of admission he was struggling to breathe and wheezing loudly.  In the ED, he received prednisone 60 mg, doxycycline, and breathing treatments which improved  his symptoms significantly, but he was admitted because he was requiring supplemental oxygen.  Overnight he was given further breathing treatments with further improvement of his breathing.  He did not desaturate below 88% on room air while walking, and felt that his breathing was back near his baseline.  He was discharged with 40 mg prednisone daily and azithromycin to complete 5 days of treatment with steroid and antibiotics, and continued on his home inhalers.  He will follow up in the [facility] in  1 week.   2. Chest Pain While his chest tightness was likely attributable to his COPD exacerbation, ACS was ruled out with EKG without findings of acute ischemia and undetectable troponins.  He was kept on telemetry overnight for an irregular rhythm, which showed sinus rhythm with numerous PACs.  2. HTN Normotensive, continued on home meds.    Discharge Vitals:   BP 124/69 (BP Location: Right Arm)   Pulse 78   Temp 98.7 F (37.1 C) (Oral)   Resp 18   Ht 5\' 9"  (1.753 m)   Wt 145 lb (65.8 kg)   SpO2 93%   BMI 21.41 kg/m   O2 sats 92% at rest, 88% with ambulation on room air  Pertinent Labs, Studies, and Procedures:   Chest Radiographs (08/05/16): "1. No evidence of acute disease.  2. COPD."  EKG (08/05/2016): Sinus tachycardia, anteroseptal TWI, no ST changes  Telemetry: sinus rhythm with PACs  Discharge Instructions: Discharge Instructions    Diet - low sodium heart healthy    Complete by:  As directed    Increase activity slowly    Complete by:  As directed      You were admitted to the hospital for difficulty breathing from a COPD exacerbation.  We gave you breathing treatments, steroids, and antibiotics and made sure your oxygen levels did not fall too far when walking.  It is important that you quit smoking.  Smoking cigarettes will continue to damage your lungs and make your COPD worse.  The [facility] at [facility] will call you to schedule a follow-up appointment in 1-2 weeks.  If you have difficulty breathing that does not get better with your albuterol inhaler, please come back to the ED.   Signed: Minus Liberty, MD 08/06/2016, 1:15 PM   Pager: [phone]

## 2016-08-06 DIAGNOSIS — F17200 Nicotine dependence, unspecified, uncomplicated: Secondary | ICD-10-CM | POA: Diagnosis not present

## 2016-08-06 DIAGNOSIS — J441 Chronic obstructive pulmonary disease with (acute) exacerbation: Secondary | ICD-10-CM | POA: Diagnosis not present

## 2016-08-06 DIAGNOSIS — J069 Acute upper respiratory infection, unspecified: Secondary | ICD-10-CM | POA: Diagnosis not present

## 2016-08-06 LAB — BASIC METABOLIC PANEL
Anion gap: 10 (ref 5–15)
BUN: 11 mg/dL (ref 6–20)
CHLORIDE: 102 mmol/L (ref 101–111)
CO2: 27 mmol/L (ref 22–32)
CREATININE: 0.72 mg/dL (ref 0.61–1.24)
Calcium: 9.1 mg/dL (ref 8.9–10.3)
GFR calc non Af Amer: >60 mL/min (ref >60)
Glucose, Bld: 132 mg/dL — ABNORMAL HIGH (ref 65–99)
Potassium: 4.1 mmol/L (ref 3.5–5.1)
Sodium: 139 mmol/L (ref 135–145)

## 2016-08-06 LAB — CBC
HEMATOCRIT: 43.9 % (ref 39.0–52.0)
HEMOGLOBIN: 14.2 g/dL (ref 13.0–17.0)
MCH: 31.3 pg (ref 26.0–34.0)
MCHC: 32.3 g/dL (ref 30.0–36.0)
MCV: 96.7 fL (ref 78.0–100.0)
PLATELETS: 251 10*3/uL (ref 150–400)
RBC: 4.54 MIL/uL (ref 4.22–5.81)
RDW: 14.7 % (ref 11.5–15.5)
WBC: 12.8 10*3/uL — ABNORMAL HIGH (ref 4.0–10.5)

## 2016-08-06 MED ORDER — AZITHROMYCIN 250 MG PO TABS: 250.0000 mg | ORAL_TABLET | Freq: Every day | ORAL | 0 refills | Status: DC

## 2016-08-06 MED ORDER — PREDNISONE 20 MG PO TABS: 40.0000 mg | ORAL_TABLET | Freq: Every day | ORAL | 0 refills | Status: AC

## 2016-08-06 MED ORDER — ASPIRIN 81 MG PO TBEC: 81.0000 mg | DELAYED_RELEASE_TABLET | Freq: Every day | ORAL | 3 refills | Status: DC

## 2016-08-06 NOTE — Progress Notes (Signed)
Nsg Discharge Note  Admit Date:  08/05/2016 Discharge date: 08/06/2016   Brian Graham to be D/C'd Home per MD order.  AVS completed.  Copy for chart, and copy for patient signed, and dated. Patient/caregiver able to verbalize understanding.  Discharge Medication:   Medication List    STOP taking these medications   aspirin 325 MG tablet Replaced by:  aspirin 81 MG EC tablet     TAKE these medications   acetaminophen 500 MG tablet Commonly known as:  TYLENOL Take 1,000 mg by mouth every 6 (six) hours as needed for mild pain.   amLODipine 5 MG tablet Commonly known as:  NORVASC   aspirin 81 MG EC tablet Take 1 tablet (81 mg total) by mouth daily. Start taking on:  08/07/2016 Replaces:  aspirin 325 MG tablet   atenolol 50 MG tablet Commonly known as:  TENORMIN   atorvastatin 80 MG tablet Commonly known as:  LIPITOR   azithromycin 250 MG tablet Commonly known as:  ZITHROMAX Take 1 tablet (250 mg total) by mouth daily. Start taking on:  08/07/2016   benazepril 20 MG tablet Commonly known as:  LOTENSIN   fluticasone 50 MCG/ACT nasal spray Commonly known as:  FLONASE   predniSONE 20 MG tablet Commonly known as:  DELTASONE Take 2 tablets (40 mg total) by mouth daily with breakfast. Start taking on:  08/07/2016   primidone 250 MG tablet Commonly known as:  MYSOLINE Take 0.5 tablets (125 mg total) by mouth 2 (two) times daily.   PROAIR HFA 108 (90 Base) MCG/ACT inhaler Generic drug:  albuterol   SPIRIVA HANDIHALER 18 MCG inhalation capsule Generic drug:  tiotropium   SYMBICORT 160-4.5 MCG/ACT inhaler Generic drug:  budesonide-formoterol   SYNTHROID 125 MCG tablet Generic drug:  levothyroxine       Discharge Assessment: Vitals:   08/06/16 0830 08/06/16 0919  BP: 124/69   Pulse: 75 78  Resp: 18   Temp: 98.7 F (37.1 C)    Skin clean, dry and intact without evidence of skin break down, no evidence of skin tears noted. IV catheter discontinued intact.  Site without signs and symptoms of complications - no redness or edema noted at insertion site, patient denies c/o pain - only slight tenderness at site.  Dressing with slight pressure applied.  D/c Instructions-Education: Discharge instructions given to patient/family with verbalized understanding. D/c education completed with patient/family including follow up instructions, medication list, d/c activities limitations if indicated, with other d/c instructions as indicated by MD - patient able to verbalize understanding, all questions fully answered. Patient instructed to return to ED, call 911, or call MD for any changes in condition.  Patient escorted via [place], and D/C home via private auto.  Yuri Flener Margaretha Sheffield, RN 08/06/2016 1:38 PM

## 2016-08-06 NOTE — Progress Notes (Signed)
   Subjective: No acute events overnight.  Breathing much improved overnight, feels almost at baseline.  Objective:  Vital signs in last 24 hours: Vitals:   08/05/16 2222 08/06/16 0606 08/06/16 0830 08/06/16 0919  BP: (!) 144/95 (!) 122/50 124/69   Pulse: 94 74 75 78  Resp: 18 17 18    Temp: 98.6 F (37 C) 98.2 F (36.8 C) 98.7 F (37.1 C)   TempSrc: Oral Oral Oral   SpO2: 97% 98% 98% 93%  Weight:      Height:       Physical Exam  Constitutional: He is oriented to person, place, and time. He appears well-developed and well-nourished.  Cardiovascular: Normal rate and regular rhythm.   Pulmonary/Chest:  No increased work of breathing Moderate diffuse wheezes  Neurological: He is alert and oriented to person, place, and time.  Skin: Skin is warm and dry.  Psychiatric: He has a normal mood and affect. His behavior is normal.   Telemetry: sinus rhythm with pACs  Cardiac Panel (last 3 results)  Recent Labs  08/05/16 1851  TROPONINI <0.03   CBC Latest Ref Rng & Units 08/06/2016 08/05/2016  WBC 4.0 - 10.5 K/uL 12.8(H) 11.1(H)  Hemoglobin 13.0 - 17.0 g/dL 14.2 15.0  Hematocrit 39.0 - 52.0 % 43.9 45.3  Platelets 150 - 400 K/uL 251 232   BMP Latest Ref Rng & Units 08/05/2016 06/11/2016  Glucose 65 - 99 mg/dL 130(H) 91  BUN 6 - 20 mg/dL 9 11  Creatinine 0.61 - 1.24 mg/dL 0.94 0.73(L)  BUN/Creat Ratio 10 - 24 - 15  Sodium 135 - 145 mmol/L 138 140  Potassium 3.5 - 5.1 mmol/L 4.0 4.9  Chloride 101 - 111 mmol/L 103 97  CO2 22 - 32 mmol/L 25 26  Calcium 8.9 - 10.3 mg/dL 9.0 9.4     Assessment/Plan:  Patient Active Problem List   Diagnosis Date Noted  . Acute exacerbation of chronic obstructive pulmonary disease (COPD) ([place]) 08/05/2016  . Hx of CABG 08/05/2016  . CAD (coronary artery disease) of artery bypass graft 08/05/2016  . HTN (hypertension) 08/05/2016  . Essential tremor 06/11/2016  . Tension headache 06/11/2016   #COPD Exacerbation Symptoms improving, but still  wheezing. -Ambulatory O2 sats -Wean o2 -Duonebs q6h -Pred 40 daily for 5 days (last 9/18) -Azithro for 5 days   #Chest Tightness Likely due to his COPD, symptoms now much improved.  Negative troponins, EKG without ischemic changes.  No ACS.  #Irregular Heart Rhythm Telemetry with pACs.  #HTN Normotensive -Continue home meds  Dispo: Anticipated discharge in approximately 0-1 day(s).   Minus Liberty, MD 08/06/2016, 10:11 AM Pager: [phone]

## 2016-08-06 NOTE — Progress Notes (Signed)
Nutrition Brief Note  Patient identified on the Malnutrition Screening Tool (MST) Report  Wt Readings from Last 15 Encounters:  08/05/16 145 lb (65.8 kg)  06/11/16 145 lb (65.8 kg)   [name], a 65 year old with a history of COPD and CAD, presents with increased shortness of breath. Patient reports that both he and his wife have had a cold-like illness for the past few days, which increased his baseline level of post-nasal drip, along with "itchy throat, itchy ears," and headache.  Pt admitted with acute COPD exacerbation.   Pt walking halls with RN at time of visit. Chart reviewed; noted records from Care Everywhere, which indicate pt usually ranges between 145-147# within the past 2 years.   Per MD notes, potential for discharge later today.   Body mass index is 21.41 kg/m. Patient meets criteria for normal weight range based on current BMI.   Current diet order is Heart Healthy, patient is consuming approximately 100% of meals at this time. Labs and medications reviewed.   No nutrition interventions warranted at this time. If nutrition issues arise, please consult RD.   Angelique Chevalier A. Jimmye Norman, RD, LDN, CDE Pager: [phone] After hours Pager: [phone]

## 2016-08-06 NOTE — Progress Notes (Signed)
  Subjective: Doing well overnight without any concerns; feels that his breathing and cough are much resolved and back to their normal level.   Objective: Vital signs in last 24 hours: Vitals:   08/05/16 2222 08/06/16 0606 08/06/16 0830 08/06/16 0919  BP: (!) 144/95 (!) 122/50 124/69   Pulse: 94 74 75 78  Resp: 18 17 18    Temp: 98.6 F (37 C) 98.2 F (36.8 C) 98.7 F (37.1 C)   TempSrc: Oral Oral Oral   SpO2: 97% 98% 98% 93%  Weight:      Height:       Weight change:   Intake/Output Summary (Last 24 hours) at 08/06/16 8338 Last data filed at 08/05/16 1511  Gross per 24 hour  Intake             1000 ml  Output                0 ml  Net             1000 ml   Physical Exam: General: well-appearing, no acute distress, [place] in neck Resp: slightly improved air movement, expiratory wheezing on right CV: RRR Abdomen: soft, nontender  Lab Results: Repeat troponins <.03  Medications: I have reviewed the patient's current medications. Scheduled Meds: . amLODipine  5 mg Oral Daily  . aspirin EC  81 mg Oral Daily  . atenolol  50 mg Oral Daily  . atorvastatin  80 mg Oral q1800  . [START ON 08/07/2016] azithromycin  250 mg Oral Daily  . benazepril  20 mg Oral Daily  . enoxaparin (LOVENOX) injection  40 mg Subcutaneous Q24H  . feeding supplement (ENSURE ENLIVE)  237 mL Oral BID BM  . fluticasone  1 spray Each Nare Daily  . guaiFENesin  600 mg Oral BID  . ipratropium-albuterol  3 mL Nebulization TID  . levothyroxine  125 mcg Oral QAC breakfast  . mometasone-formoterol  2 puff Inhalation BID  . predniSONE  40 mg Oral Q breakfast   Continuous Infusions:  PRN Meds:.acetaminophen **OR** acetaminophen, albuterol, guaiFENesin-dextromethorphan Assessment/Plan: Principal Problem:   Acute exacerbation of chronic obstructive pulmonary disease (COPD) (HCC) Active Problems:   Essential tremor   Hx of CABG   CAD (coronary artery disease) of artery bypass graft   HTN  (hypertension)  COPD Exacerbation: Mr. Curley appears to be improving clinically after the first day of treatment. Without a [place] he maintained saturations in the mid-to-high 90s. He will likely be able to discharge this afternoon. --Ambulate with oxygen saturation check, if >90%, he will be ready for d/c --Continue prednisone 40 (4 more days), duonebs q6 and azithromycin  This is a Careers information officer Note.  The care of the patient was discussed with Dr. Inda Castle and the assessment and plan formulated with their assistance.  Please see their attached note for official documentation of the daily encounter.   LOS: 0 days   Glendale Chard, Medical Student 08/06/2016, 9:26 AM

## 2016-08-06 NOTE — Progress Notes (Signed)
Note says Troponin would be trended. MD on night call was called to verify the information. MD says they would talk with the day team to verify if the troponin is still being trended or stopped since the result on file was normal. Will continue to monitor.

## 2016-08-06 NOTE — Care Management Note (Signed)
Case Management Note  Patient Details  Name: Brian Graham MRN: [id number] Date of Birth: 20-Apr-1951  Subjective/Objective:                 Patient in obs for COPD flare. Verified with Dr Edwena Blow that patient would not require O2 at DC. No other CM needs identified.    Action/Plan:  Independent patient from home with wife, will DC to home today.   Expected Discharge Date:                  Expected Discharge Plan:  Home/Self Care  In-House Referral:  NA  Discharge planning Services  CM Consult  Post Acute Care Choice:  NA Choice offered to:  NA  DME Arranged:  N/A DME Agency:  NA  HH Arranged:  NA HH Agency:  NA  Status of Service:  Completed, signed off  If discussed at [place] of Stay Meetings, dates discussed:    Additional Comments:  Carles Collet, RN 08/06/2016, 12:40 PM

## 2016-08-06 NOTE — Progress Notes (Signed)
Pharmacy student rounding with IMTS/B1/[place] service. Counseled patient on two new medications for COPD exacerbation, prednisone and azithromycin. Discussed indications and side effects, including GI upset for both, and insomnia with prednisone. Counseled on importance of finishing course and taking with food.  Annabelle Harman, Student Pharmacist, [place]

## 2016-08-06 NOTE — Progress Notes (Addendum)
SATURATION QUALIFICATIONS: (This note is used to comply with regulatory documentation for home oxygen)  Patient Saturations on Room Air at Rest = 92%  Patient Saturations on Room Air while Ambulating = 88%  Patient ambulated with 2L O2 ranged from 91-94%  Please briefly explain why patient needs home oxygen:

## 2016-08-06 NOTE — Discharge Instructions (Signed)
You were admitted to the hospital for difficulty breathing from a COPD exacerbation.  We gave you breathing treatments, steroids, and antibiotics and made sure your oxygen levels did not fall too far when walking.  It is important that you quit smoking.  Smoking cigarettes will continue to damage your lungs and make your COPD worse.  The [facility] at [facility] will call you to schedule a follow-up appointment in 1-2 weeks.  If you have difficulty breathing that does not get better with your albuterol inhaler, please come back to the ED.  Chronic Obstructive Pulmonary Disease Exacerbation Chronic obstructive pulmonary disease (COPD) is a common lung condition in which airflow from the lungs is limited. COPD is a general term that can be used to describe many different lung problems that limit airflow, including chronic bronchitis and emphysema. COPD exacerbations are episodes when breathing symptoms become much worse and require extra treatment. Without treatment, COPD exacerbations can be life threatening, and frequent COPD exacerbations can cause further damage to your lungs. CAUSES  Respiratory infections.  Exposure to smoke.  Exposure to air pollution, chemical fumes, or dust. Sometimes there is no apparent cause or trigger. RISK FACTORS  Smoking cigarettes.  Older age.  Frequent prior COPD exacerbations. SIGNS AND SYMPTOMS  Increased coughing.  Increased thick spit (sputum) production.  Increased wheezing.  Increased shortness of breath.  Rapid breathing.  Chest tightness. DIAGNOSIS Your medical history, a physical exam, and tests will help your health care provider make a diagnosis. Tests may include:  A chest X-ray.  Basic lab tests.  Sputum testing.  An arterial blood gas test. TREATMENT Depending on the severity of your COPD exacerbation, you may need to be admitted to a hospital for treatment. Some of the treatments commonly used to treat COPD  exacerbations are:   Antibiotic medicines.  Bronchodilators. These are drugs that expand the air passages. They may be given with an inhaler or nebulizer. Spacer devices may be needed to help improve drug delivery.  Corticosteroid medicines.  Supplemental oxygen therapy.  Airway clearing techniques, such as noninvasive ventilation (NIV) and positive expiratory pressure (PEP). These provide respiratory support through a mask or other noninvasive device. HOME CARE INSTRUCTIONS  Do not smoke. Quitting smoking is very important to prevent COPD from getting worse and exacerbations from happening as often.  Avoid exposure to all substances that irritate the airway, especially to tobacco smoke.  If you were prescribed an antibiotic medicine, finish it all even if you start to feel better.  Take all medicines as directed by your health care provider.It is important to use correct technique with inhaled medicines.  Drink enough fluids to keep your urine clear or pale yellow (unless you have a medical condition that requires fluid restriction).  Use a cool mist vaporizer. This makes it easier to clear your chest when you cough.  If you have a home nebulizer and oxygen, continue to use them as directed.  Maintain all necessary vaccinations to prevent infections.  Exercise regularly.  Eat a healthy diet.  Keep all follow-up appointments as directed by your health care provider. SEEK IMMEDIATE MEDICAL CARE IF:  You have worsening shortness of breath.  You have trouble talking.  You have severe chest pain.  You have blood in your sputum.  You have a fever.  You have weakness, vomit repeatedly, or faint.  You feel confused.  You continue to get worse. MAKE SURE YOU:  Understand these instructions.  Will watch your condition.  Will get help right away if you are not doing well or get worse.   This information is not intended to replace advice given to you by your health  care provider. Make sure you discuss any questions you have with your health care provider.   Document Released: 09/05/2007 Document Revised: 11/29/2014 Document Reviewed: 07/13/2013 Elsevier Interactive Patient Education [employer].

## 2016-08-06 NOTE — Care Management Obs Status (Signed)
[facility] NOTIFICATION   Patient Details  Name: Brian Graham MRN: [id number] Date of Birth: 06-Jan-1951   Medicare Observation Status Notification Given:  Yes    Carles Collet, RN 08/06/2016, 11:45 AM

## 2016-08-20 ENCOUNTER — Telehealth: Payer: Self-pay | Admitting: Internal Medicine

## 2016-08-20 NOTE — Telephone Encounter (Signed)
APT. REMINDER CALL, LMTCB °

## 2016-08-23 ENCOUNTER — Ambulatory Visit (INDEPENDENT_AMBULATORY_CARE_PROVIDER_SITE_OTHER): Payer: Medicare Other | Admitting: Internal Medicine

## 2016-08-23 VITALS — BP 152/70 | HR 65 | Temp 97.9°F | Ht 69.0 in | Wt 145.3 lb

## 2016-08-23 DIAGNOSIS — R300 Dysuria: Secondary | ICD-10-CM | POA: Diagnosis not present

## 2016-08-23 DIAGNOSIS — Z09 Encounter for follow-up examination after completed treatment for conditions other than malignant neoplasm: Secondary | ICD-10-CM

## 2016-08-23 DIAGNOSIS — M25561 Pain in right knee: Secondary | ICD-10-CM

## 2016-08-23 DIAGNOSIS — J449 Chronic obstructive pulmonary disease, unspecified: Secondary | ICD-10-CM

## 2016-08-23 DIAGNOSIS — Z87448 Personal history of other diseases of urinary system: Secondary | ICD-10-CM

## 2016-08-23 DIAGNOSIS — Z87440 Personal history of urinary (tract) infections: Secondary | ICD-10-CM

## 2016-08-23 DIAGNOSIS — G8929 Other chronic pain: Secondary | ICD-10-CM

## 2016-08-23 DIAGNOSIS — F17200 Nicotine dependence, unspecified, uncomplicated: Secondary | ICD-10-CM

## 2016-08-23 DIAGNOSIS — M25562 Pain in left knee: Secondary | ICD-10-CM

## 2016-08-23 DIAGNOSIS — Z7951 Long term (current) use of inhaled steroids: Secondary | ICD-10-CM

## 2016-08-23 HISTORY — DX: Chronic obstructive pulmonary disease, unspecified: J44.9

## 2016-08-23 NOTE — Assessment & Plan Note (Signed)
Patient was admitted from 9/14-9/15 for COPD exacerbation occurring after several days of URI symptoms. He was treated with Prednisone 40 mg daily and Azithromycin to complete a 5 day course. Patient reports his symptoms have essentially resolved, last feeling chills at night 1 week ago. He denies any current dyspnea, productive cough, chest pain, or URI symptoms. He uses Spiriva qam, Symbicort BID, and Albuterol nebulizer nightly. He has an Albuterol inhaler to use as needed, but has not needed to use this recently. He has an appointment with pulmonology next week.  Last PFTs per Care Everywhere from June 2016: FEV1 47% predicted FEV1/FVC 36% predicted Diffusion capacity 59%  Patient's COPD exacerbation has resolved, he feels at baseline now with respect to his breathing. Will continue current management. Patient will follow up with Pulmonology next week, Dr. Lake Bells. -Continue Spiriva, Symbicort, Albuterol -f/u with Pulmonology

## 2016-08-23 NOTE — Assessment & Plan Note (Signed)
Patient reports chronic bilateral knee pain that has flared up the last week. He reports 6 surgical operations on his left knee. Pain occurs in both knees, equal in quality. No swelling, erythema, limitation in mobility. Pain begins several hours after waking. No association with activity. Patient takes 1000 mg Tylenol once a day with relief.  Pain likely related to osteoarthritis, although patient does not report a formal diagnosis. Overall, he seems to have good functionality and control of pain with Tylenol. He will continue to take Tylenol as needed while limiting to less than 4000 mg daily dose. He understands and agrees.

## 2016-08-23 NOTE — Progress Notes (Signed)
CC: COPD  HPI:  [name] is a 65 y.o. male with PMH as listed below who presents for hospital follow up after a COPD exacerbation. Patient's PCP has been in [place] and he is now looking for a new PCP in the [place] area. He is unsure if he would like to establish with our clinic and is actively looking for a primary care physician with an outpatient office.  COPD: Patient was admitted from 9/14-9/15 for COPD exacerbation occurring after several days of URI symptoms. He was treated with Prednisone 40 mg daily and Azithromycin to complete a 5 day course. Patient reports his symptoms have essentially resolved, last feeling chills at night 1 week ago. He denies any current dyspnea, productive cough, chest pain, or URI symptoms. He uses Spiriva qam, Symbicort BID, and Albuterol nebulizer nightly. He has an Albuterol inhaler to use as needed, but has not needed to use this recently. He has an appointment with pulmonology next week.  Dysuria: Patient reports about 2 weeks history of urinary symptoms of occasional burning, increased frequency, and now increased urgency with diminished output. 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 He reports being treated for a UTI 2 years ago.  Bilateral Knee Pain: Patient reports chronic bilateral knee pain that has flared up the last week. He reports 6 surgical operations on his left knee. Pain occurs in both knees, equal in quality. No swelling, erythema, limitation in mobility. Pain begins several hours after waking. No association with activity. Patient takes 1000 mg Tylenol once a day with relief.  Past Medical History:  Diagnosis Date  . Abnormal ECG   . Arthritis    "left knee" (08/05/2016)  . Benign essential tremor    "all my life" (08/05/2016)  . Colon polyp   . COPD (chronic obstructive pulmonary disease) ([place])   . Coronary artery disease   . GERD  (gastroesophageal reflux disease)   . Headache   . History of blood transfusion 08/1997   "when he had his heart surgery"  . History of shingles 1970-2013 X 3  . Hyperlipemia   . Hypothyroidism   . Lumbar disc disease   . Myocardial infarction 08/1997  . Rhinitis   . Weight loss     Review of Systems:   Review of Systems  Constitutional: Negative for chills, diaphoresis and fever.  HENT: Negative for congestion and sore throat.   Respiratory: Negative for cough, hemoptysis, sputum production, shortness of breath and wheezing.   Cardiovascular: Negative for chest pain.  Genitourinary: Positive for dysuria, frequency and urgency. Negative for flank pain.     Physical Exam:  Vitals:   08/23/16 1026  BP: (!) 152/70  Pulse: 65  Temp: 97.9 F (36.6 C)  TempSrc: Oral  SpO2: 96%  Weight: 145 lb 4.8 oz (65.9 kg)  Height: 5\' 9"  (1.753 m)   Physical Exam  Constitutional: He appears well-developed and well-nourished. No distress.  Cardiovascular: Normal rate and regular rhythm.   Pulmonary/Chest: Effort normal. No respiratory distress. He has no wheezes. He has no rales.  Distant breath sounds  Abdominal: Soft. Bowel sounds are normal. He exhibits no distension. There is no tenderness. There is no CVA tenderness.  Musculoskeletal: He exhibits no edema or tenderness.  Post-surgical changes at left knee. No swelling, erythema, effusion of knees. No crepitus. ROM/ strength intact b/l lower extremities.    Assessment & Plan:   See Encounters Tab for problem  based charting.  Patient discussed with Dr. Lynnae January

## 2016-08-23 NOTE — Assessment & Plan Note (Addendum)
Patient reports about 2 weeks history of urinary symptoms of occasional burning, increased frequency, and now increased urgency with diminished output. 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 He reports being treated for a UTI 2 years ago.  Will obtain urine to assess for UTI. Dipstick in clinic shows small leukocytes, negative nitrites. Will f/u on microscopy and consider a course of Bactrim if suspiscious for UTI.  ADDENDUM: Urine negative for nitrites, +2 leukocytes, few bacteria and 11-30 WBCs on microscopy. Given patient's symptoms and history of renal/urinary tract anatomic anomaly, I think a course of antibiotics is reasonable to treat for UTI. I will send a prescription for Bactrim DS to the [facility] on [address] in [place], [place] per request of patient.

## 2016-08-23 NOTE — Patient Instructions (Signed)
It was a pleasure to meet you Brian Graham.  I am glad you are starting to feel better. Please continue your Spiriva, Symbicort, and albuterol.  We will check your urine to see if you have a urinary tract infection. If this is positive, I will call you and send a prescription for antibiotics.  Your knee pain is likely from arthritis. You can continue to take Tylenol as you are, limiting the daily total to under 4000 mg.  Please continue to abstain for smoking. Please call us or 1-800-QUIT-NOW if you feel like you need further assistance with this.

## 2016-08-24 LAB — URINALYSIS, ROUTINE W REFLEX MICROSCOPIC
Bilirubin, UA: NEGATIVE
GLUCOSE, UA: NEGATIVE
Ketones, UA: NEGATIVE
Nitrite, UA: NEGATIVE
PROTEIN UA: NEGATIVE
RBC, UA: NEGATIVE
Specific Gravity, UA: 1.007 (ref 1.005–1.030)
Urobilinogen, Ur: 0.2 mg/dL (ref 0.2–1.0)
pH, UA: 6.5 (ref 5.0–7.5)

## 2016-08-24 LAB — MICROSCOPIC EXAMINATION
Casts: NONE SEEN /lpf
Epithelial Cells (non renal): NONE SEEN /hpf (ref 0–10)

## 2016-08-24 MED ORDER — SULFAMETHOXAZOLE-TRIMETHOPRIM 800-160 MG PO TABS: 1.0000 | ORAL_TABLET | Freq: Two times a day (BID) | ORAL | 0 refills | Status: DC

## 2016-08-24 NOTE — Addendum Note (Signed)
Addended by: Lenore Cordia on: 08/24/2016 05:43 PM   Modules accepted: Orders

## 2016-08-25 NOTE — Progress Notes (Signed)
Internal Medicine Clinic Attending  Case discussed with Dr. Patel,Vishal at the time of the visit.  We reviewed the resident's history and exam and pertinent patient test results.  I agree with the assessment, diagnosis, and plan of care documented in the resident's note.  

## 2016-09-01 ENCOUNTER — Ambulatory Visit: Payer: Medicare Other | Admitting: Neurology

## 2016-09-01 ENCOUNTER — Ambulatory Visit (INDEPENDENT_AMBULATORY_CARE_PROVIDER_SITE_OTHER): Payer: Medicare Other | Admitting: Pulmonary Disease

## 2016-09-01 ENCOUNTER — Encounter: Payer: Self-pay | Admitting: Pulmonary Disease

## 2016-09-01 DIAGNOSIS — J449 Chronic obstructive pulmonary disease, unspecified: Secondary | ICD-10-CM

## 2016-09-01 DIAGNOSIS — Z720 Tobacco use: Secondary | ICD-10-CM | POA: Diagnosis not present

## 2016-09-01 DIAGNOSIS — Z87891 Personal history of nicotine dependence: Secondary | ICD-10-CM

## 2016-09-01 HISTORY — DX: Personal history of nicotine dependence: Z87.891

## 2016-09-01 MED ORDER — PROAIR HFA 108 (90 BASE) MCG/ACT IN AERS: 1.0000 | INHALATION_SPRAY | RESPIRATORY_TRACT | 5 refills | Status: DC | PRN

## 2016-09-01 MED ORDER — UMECLIDINIUM-VILANTEROL 62.5-25 MCG/INH IN AEPB: 1.0000 | INHALATION_SPRAY | Freq: Every day | RESPIRATORY_TRACT | 0 refills | Status: DC

## 2016-09-01 MED ORDER — PREDNISONE 20 MG PO TABS: 20.0000 mg | ORAL_TABLET | Freq: Every day | ORAL | 0 refills | Status: DC

## 2016-09-01 NOTE — Patient Instructions (Signed)
Stop taking your other inhalers (Spiriva and Symbicort) Take Anoro 1 puff daily no matter how you feel Take prednisone 20 g daily 5 days Use albuterol (either Pro Air or your nebulizer) 3-4 times a day as needed for shortness of breath We will see you back in one to 2 weeks and get a spirometry test on that visit In terms of internal medicine doctors I recommend that you contact the [facility] office and ask for Dr. Rolland Bimler or Dr. Nicki Reaper In terms of cardiologists I recommend that you consider seeing Dr. Rockey Situ, Dr. Stanford Breed, or Dr. Debara Pickett We will see you back in 1-2 weeks.

## 2016-09-01 NOTE — Progress Notes (Signed)
   Subjective:    Patient ID: Brian Graham, male    DOB: 1951/03/22, 65 y.o.   MRN: [id number]  HPI    Review of Systems     Objective:   Physical Exam        Assessment & Plan:

## 2016-09-01 NOTE — Assessment & Plan Note (Signed)
He was encouraged to stay off of cigarettes. We will need to talk to him about lung cancer screening on the next visit.

## 2016-09-01 NOTE — Progress Notes (Signed)
Subjective:    Patient ID: Brian Graham, male    DOB: 08-02-1951, 65 y.o.   MRN: [id number]  HPI Chief Complaint  Patient presents with  . Advice Only    Self referral for COPD.  pt dx'ed X3 years ago, recently hospitalized for copd exacerbation.      Brian Graham is here to see me about his COPD.  He was diagnosed 3 years ago after some "kidney surgery" and he had some chest congestion and was diagnosed with COPD.  He previously followed with a pulmonologist in [place].  He had PFT's with him and he believes he had moderate COPD, but he's not sure about the numbers.    In the last three years he has experienced several colds and has had a lot of chest congestion.  He has been using his nebulizer machine quite a bit and has had much more dyspnea with chest tightness.  He had to go by ambulance to [facility] and he was given breathing treatments and discharged with prednisone to take.  He is better but he still has the mucus production and chest congestion.  He notes that when he was switched to the Spiriva Respimat from the Handihaler he started having trouble with his breathing.  He says he is now having trouble urinating (he was recently treated for a UTI.  He notes continued urgency with urination but minimal urine output.  He has been experieincing more GERD (reflux), headache and joint pain.  All these symptoms started after switching medications.  No current fevers or chills.    He is not using the albuterol nebulizer too much, just before bedtime.   He previously smoked > 1ppd for at least 50 years, he quit 3 weeks ago.  He worked for [employer], no clear exposures that he is aware of.    He had CAD and underwent a CABG x3 in 1998 in [place].  No cardiologist here, no recent chest pain.     Past Medical History:  Diagnosis Date  . Abnormal ECG   . Arthritis    "left knee" (08/05/2016)  . Benign essential tremor    "all my life" (08/05/2016)  . Colon polyp   . COPD (chronic  obstructive pulmonary disease) ([place])   . Coronary artery disease   . GERD (gastroesophageal reflux disease)   . Headache   . History of blood transfusion 08/1997   "when he had his heart surgery"  . History of shingles 1970-2013 X 3  . Hyperlipemia   . Hypothyroidism   . Lumbar disc disease   . Myocardial infarction 08/1997  . Rhinitis   . Weight loss      No family history on file.   Social History   Social History  . Marital status: Married    Spouse name: N/A  . Number of children: N/A  . Years of education: N/A   Occupational History  . Not on file.   Social History Main Topics  . Smoking status: Former Smoker    Packs/day: 1.00    Years: 45.00    Types: Cigarettes    Quit date: 08/02/2016  . Smokeless tobacco: Never Used  . Alcohol use 8.4 oz/week    14 Shots of liquor per week     Comment: 08/05/2016 "2 shots of vodka/night"  . Drug use: No  . Sexual activity: Not Currently   Other Topics Concern  . Not on file   Social History Narrative  . No narrative  on file     Allergies  Allergen Reactions  . Omeprazole Diarrhea     Outpatient Medications Prior to Visit  Medication Sig Dispense Refill  . acetaminophen (TYLENOL) 500 MG tablet Take 1,000 mg by mouth every 6 (six) hours as needed for mild pain.    Marland Kitchen amLODipine (NORVASC) 5 MG tablet Take 5 mg by mouth daily.     Marland Kitchen aspirin 81 MG EC tablet Take 1 tablet (81 mg total) by mouth daily. 120 tablet 3  . atenolol (TENORMIN) 50 MG tablet Take 50 mg by mouth daily.     Marland Kitchen atorvastatin (LIPITOR) 80 MG tablet Take 80 mg by mouth daily at 6 PM.     . benazepril (LOTENSIN) 20 MG tablet Take 20 mg by mouth daily.     . primidone (MYSOLINE) 250 MG tablet Take 0.5 tablets (125 mg total) by mouth 2 (two) times daily. 60 tablet 3  . SYMBICORT 160-4.5 MCG/ACT inhaler Inhale 2 puffs into the lungs 2 (two) times daily.     Marland Kitchen SYNTHROID 125 MCG tablet Take 125 mcg by mouth daily before breakfast.     . PROAIR HFA 108 (90  Base) MCG/ACT inhaler Inhale 1-2 puffs into the lungs every 4 (four) hours as needed.     . tiotropium (SPIRIVA HANDIHALER) 18 MCG inhalation capsule     . sulfamethoxazole-trimethoprim (BACTRIM DS,SEPTRA DS) 800-160 MG tablet Take 1 tablet by mouth 2 (two) times daily. (Patient not taking: Reported on 09/01/2016) 14 tablet 0   No facility-administered medications prior to visit.       Review of Systems  Constitutional: Negative for fever and unexpected weight change.  HENT: Positive for congestion. Negative for dental problem, ear pain, nosebleeds, postnasal drip, rhinorrhea, sinus pressure, sneezing, sore throat and trouble swallowing.   Eyes: Negative for redness and itching.  Respiratory: Positive for cough. Negative for chest tightness, shortness of breath and wheezing.   Cardiovascular: Negative for palpitations and leg swelling.  Gastrointestinal: Negative for nausea and vomiting.  Genitourinary: Negative for dysuria.  Musculoskeletal: Negative for joint swelling.  Skin: Negative for rash.  Neurological: Negative for headaches.  Hematological: Does not bruise/bleed easily.  Psychiatric/Behavioral: Negative for dysphoric mood. The patient is not nervous/anxious.        Objective:   Physical Exam Vitals:   09/01/16 1013  BP: 126/66  Pulse: 65  SpO2: 98%  Weight: 142 lb (64.4 kg)  Height: 5\' 9"  (1.753 m)   RA  Gen: well appearing, no acute distress HENT: NCAT, OP clear, neck supple without masses Eyes: PERRL, EOMi Lymph: no cervical lymphadenopathy PULM: wheezing bilaterally with poor air movement, normal effort CV: RRR, no mgr, no JVD GI: BS+, soft, nontender, no hsm Derm: no rash or skin breakdown MSK: normal bulk and tone, left knee grossly abnormal Neuro: A&Ox4, CN II-XII intact, strength 5/5 in all 4 extremities Psyche: normal mood and affect    08/05/2016 chest x-ray images personally reviewed showing evidence of emphysema with flattened diaphragms      Assessment & Plan:  Tobacco abuse He was encouraged to stay off of cigarettes. We will need to talk to him about lung cancer screening on the next visit.  COPD, severe ([place]) He appears to have severe COPD based on his symptoms, recent exacerbation, emphysema on chest x-ray. However, I do not have spirometry values to quantify his airflow obstruction. I don't think it's a good idea to do it today because he still wheezing and is still having  an exacerbation of his COPD.  He's experienced severe side effects from his Spiriva rest for map including urinary retention among other problems.  He's not using albuterol as frequently as he should.  Plan: Obtain records from prior pulmonologist for PFT results and clinic notes Prednisone 20 mg 5 days Change Spriva and Symbicort 2 Anoro, samples provided today Simple spirometry when he follows up in 1-2 weeks Discuss flu shot next visit    Current Outpatient Prescriptions:  .  acetaminophen (TYLENOL) 500 MG tablet, Take 1,000 mg by mouth every 6 (six) hours as needed for mild pain., Disp: , Rfl:  .  amLODipine (NORVASC) 5 MG tablet, Take 5 mg by mouth daily. , Disp: , Rfl:  .  aspirin 81 MG EC tablet, Take 1 tablet (81 mg total) by mouth daily., Disp: 120 tablet, Rfl: 3 .  atenolol (TENORMIN) 50 MG tablet, Take 50 mg by mouth daily. , Disp: , Rfl:  .  atorvastatin (LIPITOR) 80 MG tablet, Take 80 mg by mouth daily at 6 PM. , Disp: , Rfl:  .  benazepril (LOTENSIN) 20 MG tablet, Take 20 mg by mouth daily. , Disp: , Rfl:  .  primidone (MYSOLINE) 250 MG tablet, Take 0.5 tablets (125 mg total) by mouth 2 (two) times daily., Disp: 60 tablet, Rfl: 3 .  PROAIR HFA 108 (90 Base) MCG/ACT inhaler, Inhale 1-2 puffs into the lungs every 4 (four) hours as needed., Disp: 18 g, Rfl: 5 .  SYMBICORT 160-4.5 MCG/ACT inhaler, Inhale 2 puffs into the lungs 2 (two) times daily. , Disp: , Rfl:  .  SYNTHROID 125 MCG tablet, Take 125 mcg by mouth daily before breakfast.  , Disp: , Rfl:  .  Tiotropium Bromide Monohydrate (SPIRIVA RESPIMAT) 2.5 MCG/ACT AERS, Inhale 2 puffs into the lungs daily., Disp: , Rfl:  .  predniSONE (DELTASONE) 20 MG tablet, Take 1 tablet (20 mg total) by mouth daily with breakfast., Disp: 5 tablet, Rfl: 0

## 2016-09-01 NOTE — Assessment & Plan Note (Signed)
He appears to have severe COPD based on his symptoms, recent exacerbation, emphysema on chest x-ray. However, I do not have spirometry values to quantify his airflow obstruction. I don't think it's a good idea to do it today because he still wheezing and is still having an exacerbation of his COPD.  He's experienced severe side effects from his Spiriva rest for map including urinary retention among other problems.  He's not using albuterol as frequently as he should.  Plan: Obtain records from prior pulmonologist for PFT results and clinic notes Prednisone 20 mg 5 days Change Spriva and Symbicort 2 Anoro, samples provided today Simple spirometry when he follows up in 1-2 weeks Discuss flu shot next visit

## 2016-09-15 ENCOUNTER — Ambulatory Visit (INDEPENDENT_AMBULATORY_CARE_PROVIDER_SITE_OTHER): Payer: Medicare Other | Admitting: Acute Care

## 2016-09-15 ENCOUNTER — Encounter: Payer: Self-pay | Admitting: Acute Care

## 2016-09-15 VITALS — BP 130/80 | HR 71 | Ht 69.0 in | Wt 144.0 lb

## 2016-09-15 DIAGNOSIS — Z720 Tobacco use: Secondary | ICD-10-CM | POA: Diagnosis not present

## 2016-09-15 DIAGNOSIS — J449 Chronic obstructive pulmonary disease, unspecified: Secondary | ICD-10-CM | POA: Diagnosis not present

## 2016-09-15 NOTE — Progress Notes (Signed)
History of Present Illness Brian Graham is a 65 y.o. male former smoker ( Quit 5 weeks ago) with COPD followed by Dr. Lake Bells.   He previously smoked > 1ppd for at least 50 years, he quit 3 weeks ago.  He worked for [employer], no clear exposures that he is aware of.    He had CAD and underwent a CABG x3 in 1998 in [place].  No cardiologist here, needs to establish with PCP, no recent chest pain.    09/15/2016 Follow Up Visit: Pt. Presents to the office for follow up after change of Spiriva and Symbicort to Anoro and Pred taper for exacerbation. He states that he does not feel the Anoro is working well for him. He is much more short of breath in the morning, and he feels it is the twice daily dosing that helped him vs. The once daily dosing of the Anoro. He states that he felt great on the prednisone, but understands he cannot take it on a continuous basis . He is not taking his [employer] as often as he needs to. He has several other complaints of fatigue and body aches that I have told him need to be assessed and followed by a primary care physician. He was given names of PCP's by Dr. Lake Bells at his last appointment. I have encouraged him to get established with one of those doctors. His HGB was 14 in Sept. 2017. He states he has not noticed any difference in his urinary retention, and would like to switch back to the Spiriva and Symbicort to see if he can get back to his baseline. We will follow up in 2 weeks to see if he feels this is a better treatment plan for him and to ensure no worsening of urinary retention. We will complete simple spirometry today in the office.He denies any chest pain, fever, orthopnea or hemoptysis. We will discuss lung cancer screening at his next appointment. Consider sleep study to RO OSA as he has complaints of headache. Flu vaccination is up to date.  Tests Simple Spirometry: 09/15/2016 FVC: 4.40/ 55% FEV1 3.27/ 31% F/F 74% VC= 4.40  Very Severe  Obstruction Very Low VC   Past medical hx Past Medical History:  Diagnosis Date  . Abnormal ECG   . Arthritis    "left knee" (08/05/2016)  . Benign essential tremor    "all my life" (08/05/2016)  . Colon polyp   . COPD (chronic obstructive pulmonary disease) ([place])   . Coronary artery disease   . GERD (gastroesophageal reflux disease)   . Headache   . History of blood transfusion 08/1997   "when he had his heart surgery"  . History of shingles 1970-2013 X 3  . Hyperlipemia   . Hypothyroidism   . Lumbar disc disease   . Myocardial infarction 08/1997  . Rhinitis   . Weight loss      Past surgical hx, Family hx, Social hx all reviewed.  Current Outpatient Prescriptions on File Prior to Visit  Medication Sig  . acetaminophen (TYLENOL) 500 MG tablet Take 1,000 mg by mouth every 6 (six) hours as needed for mild pain.  Marland Kitchen amLODipine (NORVASC) 5 MG tablet Take 5 mg by mouth daily.   Marland Kitchen aspirin 81 MG EC tablet Take 1 tablet (81 mg total) by mouth daily.  Marland Kitchen atenolol (TENORMIN) 50 MG tablet Take 50 mg by mouth daily.   Marland Kitchen atorvastatin (LIPITOR) 80 MG tablet Take 80 mg by mouth daily at  6 PM.   . benazepril (LOTENSIN) 20 MG tablet Take 20 mg by mouth daily.   . primidone (MYSOLINE) 250 MG tablet Take 0.5 tablets (125 mg total) by mouth 2 (two) times daily.  Marland Kitchen PROAIR HFA 108 (90 Base) MCG/ACT inhaler Inhale 1-2 puffs into the lungs every 4 (four) hours as needed.  Marland Kitchen SYNTHROID 125 MCG tablet Take 125 mcg by mouth daily before breakfast.   . umeclidinium-vilanterol (ANORO ELLIPTA) 62.5-25 MCG/INH AEPB Inhale 1 puff into the lungs daily.   No current facility-administered medications on file prior to visit.      Allergies  Allergen Reactions  . Omeprazole Diarrhea    Review Of Systems:  Constitutional:   No  weight loss, night sweats,  Fevers, chills, fatigue, or  lassitude.  HEENT:   + headaches,  Difficulty swallowing,  Tooth/dental problems, or  Sore throat,                No  sneezing, itching, ear ache, nasal congestion, post nasal drip,   CV:  No chest pain,  Orthopnea, PND, swelling in lower extremities, anasarca, dizziness, palpitations, syncope.   GI  No heartburn, indigestion, abdominal pain, nausea, vomiting, diarrhea, change in bowel habits, loss of appetite, bloody stools.   Resp: + shortness of breath with exertion less  at rest.  No excess mucus, + productive cough,  No non-productive cough,  No coughing up of blood.  No change in color of mucus.  No wheezing.  No chest wall deformity  Skin: no rash or lesions.  GU: no dysuria, change in color of urine, no urgency or frequency, some decrease in flow  No flank pain, no hematuria   MS:  No joint pain or swelling.  No decreased range of motion.  No back pain.  Psych:  No change in mood or affect. No depression or anxiety.  No memory loss.   Vital Signs BP 130/80 (BP Location: Left Arm, Cuff Size: Normal)   Pulse 71   Ht 5\' 9"  (1.753 m)   Wt 144 lb (65.3 kg)   SpO2 92%   BMI 21.27 kg/m    Physical Exam:  General- No distress,  A&Ox3, pleasant ENT: No sinus tenderness, TM clear, pale nasal mucosa, no oral exudate,no post nasal drip, no LAN Cardiac: S1, S2, regular rate and rhythm, no murmur Chest: No wheeze/ rales/ diminished per bases bilaterally ; no accessory muscle use, no nasal flaring, no sternal retractions Abd.: Soft Non-tender Ext: No clubbing cyanosis, edema Neuro:  normal strength Skin: No rashes, warm and dry Psych: normal mood and behavior   Assessment/Plan  COPD, severe ([facility]) Worsening of am shortness of breath on Anoro No significant difference in urinary retention off Spiriva/ Symbicort Wants to try Spiriva and Symbicort again with short interval f/u appointment Simple Spirometry indicates very severe obstruction Very Low VC Plan: Resume Symbicort and Spiriva as you had previously done . We will see you back in 2 weeks to ensure you are not having any worsening of  urinary retention Continue using your Pro Air as needed for shortness of breath or wheezing. Follow up in 2 weeks to see if you are better on the Symbicort and Spiriva Encouraged to establish with PCP for general care issues Please contact office for sooner follow up if symptoms do not improve or worsen or seek emergency care  Next Appoinrment Will need referral to Lung Cancer Screening at next visit Consider Sleep Study to RO OSA ( Headaches)  Tobacco abuse Remains smoke free Plan: Congratulated him on his success and encouraged him to remain smoke free.    Magdalen Spatz, NP 09/15/2016  4:22 PM

## 2016-09-15 NOTE — Patient Instructions (Addendum)
It is good to meet you today. Resume Symbicort and Spiriva as you had previously done . We will see you back in 2 weeks to ensure you are not having any worsening of urinary retention Continue using your Pro Air as needed for shortness of breath or wheezing. Follow up in 2 weeks to see if you are better on the Symbicort and Spiriva Please contact office for sooner follow up if symptoms do not improve or worsen or seek emergency care

## 2016-09-15 NOTE — Progress Notes (Signed)
Reviewed, agree with this plan of care 

## 2016-09-15 NOTE — Assessment & Plan Note (Signed)
Remains smoke free Plan: Congratulated him on his success and encouraged him to remain smoke free.

## 2016-09-15 NOTE — Assessment & Plan Note (Addendum)
Worsening of am shortness of breath on Anoro No significant difference in urinary retention off Spiriva/ Symbicort Wants to try Spiriva and Symbicort again with short interval f/u appointment Simple Spirometry indicates very severe obstruction Very Low VC Plan: Resume Symbicort and Spiriva as you had previously done . We will see you back in 2 weeks to ensure you are not having any worsening of urinary retention Continue using your Pro Air as needed for shortness of breath or wheezing. Follow up in 2 weeks to see if you are better on the Symbicort and Spiriva Encouraged to establish with PCP for general care issues Please contact office for sooner follow up if symptoms do not improve or worsen or seek emergency care  Next Appoinrment Will need referral to Lung Cancer Screening at next visit Consider Sleep Study to RO OSA ( Headaches)

## 2016-09-20 NOTE — Progress Notes (Signed)
Reviewed, agree with this plan of care 

## 2016-10-04 ENCOUNTER — Ambulatory Visit (INDEPENDENT_AMBULATORY_CARE_PROVIDER_SITE_OTHER): Payer: Medicare Other | Admitting: Pulmonary Disease

## 2016-10-04 ENCOUNTER — Encounter: Payer: Self-pay | Admitting: Pulmonary Disease

## 2016-10-04 ENCOUNTER — Other Ambulatory Visit: Payer: Medicare Other

## 2016-10-04 VITALS — BP 126/68 | HR 61 | Ht 69.0 in | Wt 143.8 lb

## 2016-10-04 DIAGNOSIS — J449 Chronic obstructive pulmonary disease, unspecified: Secondary | ICD-10-CM

## 2016-10-04 DIAGNOSIS — M791 Myalgia, unspecified site: Secondary | ICD-10-CM

## 2016-10-04 NOTE — Progress Notes (Signed)
Subjective:    Patient ID: Brian Graham, male    DOB: [birth year], 65 y.o.   MRN: [id number]  Synopsis: First referred in 2017 for severe COPD after being followed by the pulmonary group in [place] before moving to [place]. Alpha 1 studies normal per prior pulmonary group notes 2014 M-M Smoked 40 years, quit in 2012 June 2016 PFT from prior pulmonary group: "significant obstruction" FEV1 1.58L (47% pred), Residual volume 171% pred, DLCO 59% pred Simple Spirometry>> 09/15/2016 ratio 42% FEV1 1.02 L / 31%   HPI Chief Complaint  Patient presents with  . Follow-up    pt doing better on symbicort and spiriva- pt c/o chronic body aches, sob with exertion, anxiety d/t sob.     Brian Graham says that he is better on Symbicort and Spiriva and says he is doing OK on it.  He didn't like the once a day anoro because it would "wear off" by bed time. He says that his dyspnea is OK and he is getting by.  He says that he is worried about his health in general because he is having diffuse arthralgias and headache which is reminiscent of when he had Lyme's disease years ago.  He said that he has also experienced these symptoms with urinary tract infections. He says that his PCP worked him up for a UTI and it was negative.  He feels fatigue all over, knee pain, headache, elbow pain.  He is lethargic.     Past Medical History:  Diagnosis Date  . Abnormal ECG   . Arthritis    "left knee" (08/05/2016)  . Benign essential tremor    "all my life" (08/05/2016)  . Colon polyp   . COPD (chronic obstructive pulmonary disease) ([place])   . Coronary artery disease   . GERD (gastroesophageal reflux disease)   . Headache   . History of blood transfusion 08/1997   "when he had his heart surgery"  . History of shingles 1970-2013 X 3  . Hyperlipemia   . Hypothyroidism   . Lumbar disc disease   . Myocardial infarction 08/1997  . Rhinitis   . Weight loss       Review of Systems  Constitutional: Positive for  fatigue. Negative for chills and fever.  HENT: Negative for nosebleeds, postnasal drip and rhinorrhea.   Respiratory: Positive for shortness of breath. Negative for cough and wheezing.   Cardiovascular: Negative for chest pain, palpitations and leg swelling.       Objective:   Physical Exam  Vitals:   10/04/16 1132  BP: 126/68  Pulse: 61  SpO2: 96%  Weight: 143 lb 12.8 oz (65.2 kg)  Height: 5\' 9"  (1.753 m)   Gen: chronically ill appearing HENT: OP clear, TM's clear, neck supple PULM: CTA B, normal percussion CV: RRR, no mgr, trace edema GI: BS+, soft, nontender Derm: no cyanosis or rash Psyche: normal mood and affect  CBC    Component Value Date/Time   WBC 12.8 (H) 08/06/2016 0851   RBC 4.54 08/06/2016 0851   HGB 14.2 08/06/2016 0851   HCT 43.9 08/06/2016 0851   PLT 251 08/06/2016 0851   MCV 96.7 08/06/2016 0851   MCH 31.3 08/06/2016 0851   MCHC 32.3 08/06/2016 0851   RDW 14.7 08/06/2016 0851   LYMPHSABS 0.9 08/05/2016 1023   MONOABS 0.5 08/05/2016 1023   EOSABS 0.1 08/05/2016 1023   BASOSABS 0.0 08/05/2016 1023        Assessment & Plan:  COPD, severe ([place]) He  has severe COPD with an FEV1 of 31% predicted but on Spiriva and Symbicort this is been a stable interval for him. His alpha-1 testing in 2014 was normal.  He does continue to have some dyspnea but his symptoms are manageable and unexpected considering his degree of airflow obstruction. Flu shot is up-to-date on his pneumonia vaccines are up-to-date.  Plan: Continue Symbicort and Spiriva Follow-up 4 months  Myalgia He has diffuse myalgias also with headache and arthralgias which she says is consistent with prior infections. I was able to review the records from his primary care physician's visit about 2 weeks ago where he had a normal CBC and urinalysis and chemistry. However, I do worry about the possibility of statin induced myopathy and myopathic pain. Given the nonspecific nature of his systemic  symptoms if the total CK is normal he may want to have a rheumatologist evaluation.  Plan: Total CK    Current Outpatient Prescriptions:  .  acetaminophen (TYLENOL) 500 MG tablet, Take 1,000 mg by mouth every 6 (six) hours as needed for mild pain., Disp: , Rfl:  .  amLODipine (NORVASC) 5 MG tablet, Take 5 mg by mouth daily. , Disp: , Rfl:  .  aspirin 81 MG EC tablet, Take 1 tablet (81 mg total) by mouth daily., Disp: 120 tablet, Rfl: 3 .  atenolol (TENORMIN) 50 MG tablet, Take 50 mg by mouth daily. , Disp: , Rfl:  .  atorvastatin (LIPITOR) 80 MG tablet, Take 80 mg by mouth daily at 6 PM. , Disp: , Rfl:  .  benazepril (LOTENSIN) 20 MG tablet, Take 20 mg by mouth daily. , Disp: , Rfl:  .  budesonide-formoterol (SYMBICORT) 160-4.5 MCG/ACT inhaler, Inhale 2 puffs into the lungs 2 (two) times daily., Disp: , Rfl:  .  primidone (MYSOLINE) 250 MG tablet, Take 0.5 tablets (125 mg total) by mouth 2 (two) times daily., Disp: 60 tablet, Rfl: 3 .  PROAIR HFA 108 (90 Base) MCG/ACT inhaler, Inhale 1-2 puffs into the lungs every 4 (four) hours as needed., Disp: 18 g, Rfl: 5 .  SYNTHROID 125 MCG tablet, Take 125 mcg by mouth daily before breakfast. , Disp: , Rfl:  .  Tiotropium Bromide Monohydrate (SPIRIVA RESPIMAT) 2.5 MCG/ACT AERS, Inhale 2 puffs into the lungs daily., Disp: , Rfl:

## 2016-10-04 NOTE — Assessment & Plan Note (Signed)
He has severe COPD with an FEV1 of 31% predicted but on Spiriva and Symbicort this is been a stable interval for him. His alpha-1 testing in 2014 was normal.  He does continue to have some dyspnea but his symptoms are manageable and unexpected considering his degree of airflow obstruction. Flu shot is up-to-date on his pneumonia vaccines are up-to-date.  Plan: Continue Symbicort and Spiriva Follow-up 4 months

## 2016-10-04 NOTE — Assessment & Plan Note (Signed)
He has diffuse myalgias also with headache and arthralgias which she says is consistent with prior infections. I was able to review the records from his primary care physician's visit about 2 weeks ago where he had a normal CBC and urinalysis and chemistry. However, I do worry about the possibility of statin induced myopathy and myopathic pain. Given the nonspecific nature of his systemic symptoms if the total CK is normal he may want to have a rheumatologist evaluation.  Plan: Total CK

## 2016-10-04 NOTE — Patient Instructions (Signed)
Keep taking your medications as you're doing We will call you with the results of today's blood work If the blood work is normal you may want to see a rheumatologist to discuss this further We will see you back in 4 months or sooner if

## 2016-10-05 LAB — CK TOTAL AND CKMB (NOT AT ARMC)
CK TOTAL: 70 U/L (ref 7–232)
CK, MB: 1.2 ng/mL (ref 0.0–5.0)
Relative Index: 1.7 (ref 0.0–4.0)

## 2016-12-06 ENCOUNTER — Other Ambulatory Visit: Payer: Self-pay | Admitting: Urology

## 2016-12-06 DIAGNOSIS — N1330 Unspecified hydronephrosis: Secondary | ICD-10-CM

## 2016-12-13 ENCOUNTER — Telehealth: Payer: Self-pay | Admitting: Pulmonary Disease

## 2016-12-13 MED ORDER — AZITHROMYCIN 250 MG PO TABS: 250.0000 mg | ORAL_TABLET | ORAL | 0 refills | Status: DC

## 2016-12-13 MED ORDER — PREDNISONE 10 MG PO TABS
ORAL_TABLET | ORAL | 0 refills | Status: DC
Start: 2016-12-13 — End: 2017-04-04

## 2016-12-13 NOTE — Telephone Encounter (Signed)
Pt c/o increased prod cough with green mucus, increased SOB X4-5 days.  Denies fever, chest pain/tightness, sinus congestion, pnd.  Pt has taken maintenance and rescue inhalers to help with symptoms only.  Requesting further recs.   [facility] [place]  Sending to DOD as BQ is unavailable today.  RB please advise.  Thanks!

## 2016-12-13 NOTE — Telephone Encounter (Signed)
Pt aware of rec's per RB Rx's for Pred and Zpak sent to [facility].  Nothing further needed.

## 2016-12-13 NOTE — Telephone Encounter (Signed)
Would treat him for an acute exacerbation. If no better next couple days then he needs to be seen in office  Prednisone: Take 40mg  daily for 3 days, then 30mg  daily for 3 days, then 20mg  daily for 3 days, then 10mg  daily for 3 days, then stop  Azithromycin >> Z pack

## 2016-12-15 ENCOUNTER — Ambulatory Visit (HOSPITAL_COMMUNITY): Payer: Medicare Other

## 2016-12-20 ENCOUNTER — Ambulatory Visit (HOSPITAL_COMMUNITY)
Admission: RE | Admit: 2016-12-20 | Discharge: 2016-12-20 | Disposition: A | Discharge status: Home / Self Care | Payer: Medicare Other | Source: Ambulatory Visit | Attending: Urology | Admitting: Urology

## 2016-12-20 DIAGNOSIS — N1330 Unspecified hydronephrosis: Secondary | ICD-10-CM | POA: Diagnosis present

## 2016-12-20 DIAGNOSIS — N2889 Other specified disorders of kidney and ureter: Secondary | ICD-10-CM | POA: Insufficient documentation

## 2016-12-20 MED ORDER — FUROSEMIDE 10 MG/ML IJ SOLN
INTRAMUSCULAR | Status: AC
  Administered 2016-12-20: 32.75 mg via INTRAVENOUS
  Filled 2016-12-20: qty 4

## 2016-12-20 MED ORDER — FUROSEMIDE 10 MG/ML IJ SOLN
32.7500 mg | Freq: Once | INTRAMUSCULAR | Status: AC
  Administered 2016-12-20: 32.75 mg via INTRAVENOUS

## 2016-12-20 MED ORDER — TECHNETIUM TC 99M MERTIATIDE
15.0000 | Freq: Once | INTRAVENOUS | Status: AC | PRN
  Administered 2016-12-20: 15 via INTRAVENOUS

## 2017-02-02 ENCOUNTER — Encounter: Payer: Self-pay | Admitting: Pulmonary Disease

## 2017-02-02 ENCOUNTER — Ambulatory Visit (INDEPENDENT_AMBULATORY_CARE_PROVIDER_SITE_OTHER): Payer: Medicare Other | Admitting: Pulmonary Disease

## 2017-02-02 VITALS — BP 130/70 | HR 70 | Ht 69.0 in | Wt 140.2 lb

## 2017-02-02 DIAGNOSIS — Z720 Tobacco use: Secondary | ICD-10-CM

## 2017-02-02 DIAGNOSIS — J449 Chronic obstructive pulmonary disease, unspecified: Secondary | ICD-10-CM

## 2017-02-02 DIAGNOSIS — G250 Essential tremor: Secondary | ICD-10-CM | POA: Diagnosis not present

## 2017-02-02 MED ORDER — ALBUTEROL SULFATE (2.5 MG/3ML) 0.083% IN NEBU: 2.5000 mg | INHALATION_SOLUTION | Freq: Four times a day (QID) | RESPIRATORY_TRACT | 11 refills | Status: DC | PRN

## 2017-02-02 NOTE — Patient Instructions (Signed)
For your tremor: I recommend that you consider seeing Dr. Wells Guiles Tat  For your COPD: Keep taking Symbicort and Spiriva as you are doing Stay active, exercises regularly  We will see you back in 6 months or sooner if

## 2017-02-02 NOTE — Assessment & Plan Note (Signed)
Poor control, did not tolerate recently prescribed medicines well. I recommended that he see a movement disorder specialist

## 2017-02-02 NOTE — Assessment & Plan Note (Signed)
This has been a stable interval for him. He had a mild exacerbation in the setting of a respiratory infection but he's been stable since then. He is not exercising, I recommended that he start participating in a regular exercise routine.  Plan: Continue Symbicort and Spiriva Exercise regularly

## 2017-02-02 NOTE — Assessment & Plan Note (Signed)
Currently not smoking.  Continue to stay away from cigarettes.

## 2017-02-02 NOTE — Progress Notes (Signed)
Subjective:    Patient ID: Brian Graham, male    DOB: 08/02/1951, 66 y.o.   MRN: [id number]  Synopsis: First referred in 2017 for severe COPD after being followed by the pulmonary group in [place] before moving to [place]. Alpha 1 studies normal per prior pulmonary group notes 2014 M-M Smoked 40 years, quit in 2012 June 2016 PFT from prior pulmonary group: "significant obstruction" FEV1 1.58L (47% pred), Residual volume 171% pred, DLCO 59% pred Simple Spirometry>> 09/15/2016 ratio 42% FEV1 1.02 L / 31%   HPI Chief Complaint  Patient presents with  . Follow-up    pt reports he is doing well, f/u COPD, doing well managing on Spiriva and Symbicort   [name] stayed flu free this year.  He had a flare up with a cold recently and needed prednisone and azithromycin.  He has been well since the exacerbation. He continues to take Symbicort and Spiriva. These are not giving him any side effects. He rinses his mouth after using them. He has been sedentary. He is not exercising. No chest tightness, wheezing or albuterol use other than nightly use which has been a routine for him for some time.  He still has dyspnea with exertion, this has not changed.    Past Medical History:  Diagnosis Date  . Abnormal ECG   . Arthritis    "left knee" (08/05/2016)  . Benign essential tremor    "all my life" (08/05/2016)  . Colon polyp   . COPD (chronic obstructive pulmonary disease) ([place])   . Coronary artery disease   . GERD (gastroesophageal reflux disease)   . Headache   . History of blood transfusion 08/1997   "when he had his heart surgery"  . History of shingles 1970-2013 X 3  . Hyperlipemia   . Hypothyroidism   . Lumbar disc disease   . Myocardial infarction 08/1997  . Rhinitis   . Weight loss       Review of Systems  Constitutional: Positive for fatigue. Negative for chills and fever.  HENT: Negative for nosebleeds, postnasal drip and rhinorrhea.   Respiratory: Positive for  shortness of breath. Negative for cough and wheezing.   Cardiovascular: Negative for chest pain, palpitations and leg swelling.       Objective:   Physical Exam  Vitals:   02/02/17 1200  BP: 130/70  Pulse: 70  SpO2: 94%  Weight: 140 lb 3.2 oz (63.6 kg)  Height: 5\' 9"  (1.753 m)   Gen: chronically illl appearing HENT: OP clear, TM's clear, neck supple PULM: Poor air movement but no wheezing, normal percussion CV: RRR, no mgr, trace edema GI: BS+, soft, nontender Derm: no cyanosis or rash Psyche: normal mood and affect Neuro: intention tremor hands and head tremor noted   CBC    Component Value Date/Time   WBC 12.8 (H) 08/06/2016 0851   RBC 4.54 08/06/2016 0851   HGB 14.2 08/06/2016 0851   HCT 43.9 08/06/2016 0851   PLT 251 08/06/2016 0851   MCV 96.7 08/06/2016 0851   MCH 31.3 08/06/2016 0851   MCHC 32.3 08/06/2016 0851   RDW 14.7 08/06/2016 0851   LYMPHSABS 0.9 08/05/2016 1023   MONOABS 0.5 08/05/2016 1023   EOSABS 0.1 08/05/2016 1023   BASOSABS 0.0 08/05/2016 1023        Assessment & Plan:  COPD, severe ([place]) This has been a stable interval for him. He had a mild exacerbation in the setting of a respiratory infection but he's been stable since  then. He is not exercising, I recommended that he start participating in a regular exercise routine.  Plan: Continue Symbicort and Spiriva Exercise regularly  Tobacco abuse Currently not smoking.  Continue to stay away from cigarettes.  Essential tremor Poor control, did not tolerate recently prescribed medicines well. I recommended that he see a movement disorder specialist  > 50% of this 27 minute visit spent face to face  Current Outpatient Prescriptions:  .  acetaminophen (TYLENOL) 500 MG tablet, Take 1,000 mg by mouth every 6 (six) hours as needed for mild pain., Disp: , Rfl:  .  amLODipine (NORVASC) 5 MG tablet, Take 5 mg by mouth daily. , Disp: , Rfl:  .  aspirin 81 MG EC tablet, Take 1 tablet (81 mg total)  by mouth daily., Disp: 120 tablet, Rfl: 3 .  atenolol (TENORMIN) 50 MG tablet, Take 50 mg by mouth daily. , Disp: , Rfl:  .  atorvastatin (LIPITOR) 80 MG tablet, Take 80 mg by mouth daily at 6 PM. , Disp: , Rfl:  .  azithromycin (ZITHROMAX) 250 MG tablet, Take 1 tablet (250 mg total) by mouth as directed., Disp: 6 tablet, Rfl: 0 .  benazepril (LOTENSIN) 20 MG tablet, Take 20 mg by mouth daily. , Disp: , Rfl:  .  budesonide-formoterol (SYMBICORT) 160-4.5 MCG/ACT inhaler, Inhale 2 puffs into the lungs 2 (two) times daily., Disp: , Rfl:  .  predniSONE (DELTASONE) 10 MG tablet, Take 4 tabs po x 3 days, then 3 x 3 days, then 2 x 3 days, then 1 x 3 days then stop., Disp: 30 tablet, Rfl: 0 .  PROAIR HFA 108 (90 Base) MCG/ACT inhaler, Inhale 1-2 puffs into the lungs every 4 (four) hours as needed., Disp: 18 g, Rfl: 5 .  SYNTHROID 125 MCG tablet, Take 125 mcg by mouth daily before breakfast. , Disp: , Rfl:  .  tamsulosin (FLOMAX) 0.4 MG CAPS capsule, Take 0.4 mg by mouth., Disp: , Rfl:  .  Tiotropium Bromide Monohydrate (SPIRIVA RESPIMAT) 2.5 MCG/ACT AERS, Inhale 2 puffs into the lungs daily., Disp: , Rfl:  .  albuterol (PROVENTIL) (2.5 MG/3ML) 0.083% nebulizer solution, Take 3 mLs (2.5 mg total) by nebulization every 6 (six) hours as needed for wheezing or shortness of breath., Disp: 360 mL, Rfl: 11

## 2017-03-29 ENCOUNTER — Telehealth: Payer: Self-pay | Admitting: Internal Medicine

## 2017-03-29 NOTE — Telephone Encounter (Signed)
Pt has been scheduled.  °

## 2017-03-29 NOTE — Telephone Encounter (Signed)
Pt was referred by Dr. Lake Bells and would like to see if they he would accept him as a new Medicare pt?

## 2017-03-29 NOTE — Telephone Encounter (Signed)
Yes thanks, will accept since referred by Dr. Lake Bells

## 2017-04-04 ENCOUNTER — Ambulatory Visit (INDEPENDENT_AMBULATORY_CARE_PROVIDER_SITE_OTHER): Payer: Medicare Other | Admitting: Family Medicine

## 2017-04-04 ENCOUNTER — Encounter: Payer: Self-pay | Admitting: Family Medicine

## 2017-04-04 DIAGNOSIS — I10 Essential (primary) hypertension: Secondary | ICD-10-CM | POA: Diagnosis not present

## 2017-04-04 DIAGNOSIS — E7800 Pure hypercholesterolemia, unspecified: Secondary | ICD-10-CM

## 2017-04-04 DIAGNOSIS — E785 Hyperlipidemia, unspecified: Secondary | ICD-10-CM | POA: Insufficient documentation

## 2017-04-04 DIAGNOSIS — R300 Dysuria: Secondary | ICD-10-CM

## 2017-04-04 DIAGNOSIS — I25810 Atherosclerosis of coronary artery bypass graft(s) without angina pectoris: Secondary | ICD-10-CM | POA: Diagnosis not present

## 2017-04-04 DIAGNOSIS — E039 Hypothyroidism, unspecified: Secondary | ICD-10-CM | POA: Insufficient documentation

## 2017-04-04 DIAGNOSIS — G250 Essential tremor: Secondary | ICD-10-CM | POA: Diagnosis not present

## 2017-04-04 DIAGNOSIS — J302 Other seasonal allergic rhinitis: Secondary | ICD-10-CM | POA: Insufficient documentation

## 2017-04-04 DIAGNOSIS — Z87891 Personal history of nicotine dependence: Secondary | ICD-10-CM

## 2017-04-04 NOTE — Progress Notes (Signed)
Phone: [phone]  Subjective:  Patient presents today to establish care.  Prior patient of Dr. Carolin Sicks from [facility]. Chief complaint-noted.   See problem oriented charting  The following were reviewed and entered/updated in epic: Past Medical History:  Diagnosis Date  . Abnormal ECG   . Arthritis    "left knee" (08/05/2016)  . Benign essential tremor    "all my life" (08/05/2016)  . CAD (coronary artery disease) of artery bypass graft 08/05/2016   Around age 56. CABG - 3 vessel. States he only has 3 valves?  . Colon polyp   . COPD, severe ([place]) 08/23/2016   Alpha 1 studies normal per prior pulmonary group notes Smoked 40 years, quit in 2012 June 2016 PFT from prior pulmonary group: "significant obstruction" FEV1 1.58L (47% pred), Residual volume 171% pred, DLCO 59% pred Simple Spirometry>> 09/15/2016 ratio 42% FEV1 1.02 L / 31%   . Coronary artery disease   . Essential tremor 06/11/2016   Plans to see Dr. Carles Collet  . Former smoker 09/01/2016   Quit 2012. 40 pack years at least.   . GERD (gastroesophageal reflux disease)   . Headache   . History of blood transfusion 08/1997   "when he had his heart surgery"  . History of shingles 1970-2013 X 3  . HTN (hypertension) 08/05/2016   Amlodipine 5mg , atenolol 50mg  BID, benazepril 20mg   . Hyperlipemia   . Hyperlipidemia 04/04/2017   Atorvastatin 80mg   . Hypothyroidism   . Lumbar disc disease   . Myocardial infarction ([place]) 08/1997  . Rhinitis    Patient Active Problem List   Diagnosis Date Noted  . Former smoker 09/01/2016    Priority: High  . COPD, severe ([place]) 08/23/2016    Priority: High  . Hx of CABG 08/05/2016    Priority: High  . CAD (coronary artery disease) of artery bypass graft 08/05/2016    Priority: High  . Hyperlipidemia 04/04/2017    Priority: Medium  . Hypothyroidism 04/04/2017    Priority: Medium  . HTN (hypertension) 08/05/2016    Priority: Medium  . Essential tremor 06/11/2016    Priority: Medium  .  Seasonal allergies 04/04/2017    Priority: Low  . Myalgia 10/04/2016    Priority: Low  . Dysuria 08/23/2016    Priority: Low  . Chronic pain of both knees 08/23/2016    Priority: Low  . Tension headache 06/11/2016    Priority: Low   Past Surgical History:  Procedure Laterality Date  . BACK SURGERY    . CARDIAC CATHETERIZATION  08/1997  . CORONARY ARTERY BYPASS GRAFT  09/12/1997   "triple"  . FRACTURE SURGERY    . INGUINAL HERNIA REPAIR Right 1961  . KNEE RECONSTRUCTION Left 1960s - 1974 X 4  . LAPAROSCOPIC ABLATION RENAL MASS  ~ 2014   "large abscess/pus ball"  . [place]   "they were wedged in the bowel"  . PATELLA FRACTURE SURGERY Left 1963  . PATELLA FRACTURE SURGERY Left ~ 1965   "removed knee cap"  . TONSILLECTOMY      Family History  Problem Relation Age of Onset  . Alcohol abuse Mother   . Alcohol abuse Father        not involved in life  . Alcoholism Sister   . Healthy Sister   . Healthy Sister     Medications- reviewed and updated Current Outpatient Prescriptions  Medication Sig Dispense Refill  . amLODipine (NORVASC) 5 MG tablet Take 5 mg by mouth  daily.     . aspirin 81 MG EC tablet Take 1 tablet (81 mg total) by mouth daily. 120 tablet 3  . atenolol (TENORMIN) 50 MG tablet Take 50 mg by mouth daily.     Marland Kitchen atorvastatin (LIPITOR) 80 MG tablet Take 80 mg by mouth daily at 6 PM.     . benazepril (LOTENSIN) 20 MG tablet Take 20 mg by mouth daily.     . budesonide-formoterol (SYMBICORT) 160-4.5 MCG/ACT inhaler Inhale 2 puffs into the lungs 2 (two) times daily.    Marland Kitchen PROAIR HFA 108 (90 Base) MCG/ACT inhaler Inhale 1-2 puffs into the lungs every 4 (four) hours as needed. 18 g 5  . SYNTHROID 125 MCG tablet Take 125 mcg by mouth daily before breakfast.     . Tiotropium Bromide Monohydrate (SPIRIVA RESPIMAT) 2.5 MCG/ACT AERS Inhale 2 puffs into the lungs daily.    Marland Kitchen acetaminophen (TYLENOL) 500 MG tablet Take 1,000 mg by mouth every 6 (six) hours  as needed for mild pain.    Marland Kitchen albuterol (PROVENTIL) (2.5 MG/3ML) 0.083% nebulizer solution Take 3 mLs (2.5 mg total) by nebulization every 6 (six) hours as needed for wheezing or shortness of breath. (Patient not taking: Reported on 04/04/2017) 360 mL 11   No current facility-administered medications for this visit.     Allergies-reviewed and updated Allergies  Allergen Reactions  . Ciprofloxacin Diarrhea  . Omeprazole Diarrhea    Social History   Social History  . Marital status: Married    Spouse name: N/A  . Number of children: N/A  . Years of education: N/A   Social History Main Topics  . Smoking status: Former Smoker    Packs/day: 1.00    Years: 45.00    Types: Cigarettes    Quit date: 08/03/2011  . Smokeless tobacco: Never Used  . Alcohol use 8.4 oz/week    14 Shots of liquor per week     Comment: 08/05/2016 "2 shots of vodka/night"  . Drug use: No  . Sexual activity: Not Currently   Other Topics Concern  . None   Social History Narrative   Lives with wife. 2 adopted daughters from [place].       Retired from [employer]    ROS--Full ROS was completed Review of Systems  Constitutional: Negative for chills, diaphoresis, fever and malaise/fatigue.  HENT: Negative for ear pain and hearing loss.   Eyes: Negative for blurred vision and double vision.  Respiratory: Positive for cough (long term smoker quit 2012). Negative for hemoptysis.   Cardiovascular: Negative for chest pain and palpitations.  Gastrointestinal: Negative for heartburn and nausea. Diarrhea: loose stools.  Genitourinary: Negative for flank pain and hematuria.  Musculoskeletal: Negative for myalgias and neck pain.  Skin: Negative for itching and rash.  Neurological: Negative for dizziness, weakness and headaches.  Endo/Heme/Allergies: Negative for polydipsia. Does not bruise/bleed easily.  Psychiatric/Behavioral: Negative for hallucinations and substance abuse.   Objective: BP 132/70 (BP Location:  Left Arm, Patient Position: Sitting, Cuff Size: Large)   Pulse 73   Temp 98.5 F (36.9 C) (Oral)   Ht 5' 8.25" (1.734 m)   Wt 135 lb 12.8 oz (61.6 kg)   SpO2 94%   BMI 20.50 kg/m  Gen: NAD, resting comfortably, thin HEENT: Mucous membranes are moist. Oropharynx normal. TM normal. Eyes: sclera and lids normal, PERRLA Neck: no thyromegaly, no cervical lymphadenopathy CV: RRR no murmurs rubs or gallops Lungs: CTAB no crackles, wheeze, rhonchi Abdomen: soft/nontender/nondistended/normal bowel sounds. No rebound or  guarding.  Ext: no edema Skin: warm, dry Neuro: 5/5 strength in upper and lower extremities, normal gait,  Tremor in head noted, tremor with reaching out but not at rest  Assessment/Plan:  Former smoker Quit 2012. 40 pack years at least. Ordered AAA screen and lung cancer screening referral  CAD (coronary artery disease) of artery bypass graft S: Around age 62. CABG - 3 vessel. States he only has 3 valves? Compliant with aspirin 81, atorvastatin 80mg , atenolol 50mg  A/P: wants local cardiologist- just moved from [place] 2 years ago but has not been established locally- as has been driving to [place] tremor S: tremor starting around age 11- both hands and head - states "looks like bobblehead". Has not taken medicine for it. Saw Dr. Leonie Man- placed on primidone and topamax- states with either of these could not drive his car, had double vision/blurry vision- resolved when he came off medicine. Tremor better with alcohol.  A/P: he is planning on  Calling Dr. Carles Collet for her opinion- seems very reasonable. Has not tried weighted spoons or forks- has not been able to eat as much as a result. Perhaps a few lbs of weight loss.   Dysuria S: 4 years ago started not eating- every time he started eating got nauseous. Had colonoscopy done and that was normal. ? GERD as burping a lot- endoscopy, bloodwork was normal. CT scan lower abdomen and they found kidney on right side  normal, on left was still located in pelvis. Was told had kidney stone- was told had "mass of pus" in the left pelvic kidney - took long time to recover from this. Started with UTI issues intermittently - got plugged in with Dr. Tresa Moore locally. Cystoscopy done - was told possible kidney stone and was told if issues would have to have this removed.  A/P: we will monitor- he is going to see Dr. Tresa Moore to discuss since he had similar symptoms with prior kidney stone (has had some nausea lately, mild decreased appetite, mild weight loss)  HTN (hypertension) S: controlled on Amlodipine 5mg , atenolol 50mg  BID, benazepril 20mg .  BP Readings from Last 3 Encounters:  04/04/17 132/70  02/02/17 130/70  10/04/16 126/68  A/P:Continue current meds:  Well controlled   Hyperlipidemia S: suspect controlled on atorvastatin 80mg . No myalgias.  A/P: likely update LDL next visit- also get cbc, cmp   advised follow up after lung cancer screening within 1-2 weeks  Orders Placed This Encounter  Procedures  . Ambulatory Referral for Lung Cancer Scre    Referral Priority:   Routine    Referral Type:   Consultation    Referral Reason:   Specialty Services Required    Number of Visits Requested:   1  . Ambulatory referral to Cardiology    Referral Priority:   Routine    Referral Type:   Consultation    Referral Reason:   Specialty Services Required    Requested Specialty:   Cardiology    Number of Visits Requested:   1   The duration of face-to-face time during this visit was greater than >50 minutes (3:31-4:24). Greater than 50% of this time was spent in counseling, explanation of diagnosis, planning of further management, and/or coordination of care including discussion of prior stressors of his health, difficulty in transitioning health to new area and getting reestablished.    Return precautions advised.  Garret Reddish, MD

## 2017-04-04 NOTE — Assessment & Plan Note (Signed)
S: suspect controlled on atorvastatin 80mg . No myalgias.  A/P: likely update LDL next visit- also get cbc, cmp

## 2017-04-04 NOTE — Assessment & Plan Note (Signed)
S: controlled on Amlodipine 5mg , atenolol 50mg  BID, benazepril 20mg .  BP Readings from Last 3 Encounters:  04/04/17 132/70  02/02/17 130/70  10/04/16 126/68  A/P:Continue current meds:  Well controlled

## 2017-04-04 NOTE — Assessment & Plan Note (Signed)
S: 4 years ago started not eating- every time he started eating got nauseous. Had colonoscopy done and that was normal. ? GERD as burping a lot- endoscopy, bloodwork was normal. CT scan lower abdomen and they found kidney on right side normal, on left was still located in pelvis. Was told had kidney stone- was told had "mass of pus" in the left pelvic kidney - took long time to recover from this. Started with UTI issues intermittently - got plugged in with Dr. Tresa Moore locally. Cystoscopy done - was told possible kidney stone and was told if issues would have to have this removed.  A/P: we will monitor- he is going to see Dr. Tresa Moore to discuss since he had similar symptoms with prior kidney stone (has had some nausea lately, mild decreased appetite, mild weight loss)

## 2017-04-04 NOTE — Patient Instructions (Signed)
We will call you within a week or two about your referrals (see below). If you do not hear within 3 weeks, give [place] a call.   Lets check back in 1-2 weeks after lung cancer screening  If in the morning- come fasting and we will update labs. Otherwise will adjust labs

## 2017-04-04 NOTE — Assessment & Plan Note (Signed)
S: Around age 66. CABG - 3 vessel. States he only has 3 valves? Compliant with aspirin 81, atorvastatin 80mg , atenolol 50mg  A/P: wants local cardiologist- just moved from [place] 2 years ago but has not been established locally- as has been driving to [place]

## 2017-04-04 NOTE — Assessment & Plan Note (Signed)
S: tremor starting around age 66- both hands and head - states "looks like bobblehead". Has not taken medicine for it. Saw Dr. Leonie Man- placed on primidone and topamax- states with either of these could not drive his car, had double vision/blurry vision- resolved when he came off medicine. Tremor better with alcohol.  A/P: he is planning on  Calling Dr. Carles Collet for her opinion- seems very reasonable. Has not tried weighted spoons or forks- has not been able to eat as much as a result. Perhaps a few lbs of weight loss.

## 2017-04-04 NOTE — Assessment & Plan Note (Signed)
Quit 2012. 40 pack years at least. Ordered AAA screen and lung cancer screening referral

## 2017-04-05 ENCOUNTER — Other Ambulatory Visit: Payer: Self-pay | Admitting: Acute Care

## 2017-04-05 DIAGNOSIS — Z87891 Personal history of nicotine dependence: Secondary | ICD-10-CM

## 2017-04-08 ENCOUNTER — Ambulatory Visit (INDEPENDENT_AMBULATORY_CARE_PROVIDER_SITE_OTHER): Payer: Medicare Other | Admitting: Acute Care

## 2017-04-08 ENCOUNTER — Encounter: Payer: Self-pay | Admitting: Acute Care

## 2017-04-08 ENCOUNTER — Ambulatory Visit (INDEPENDENT_AMBULATORY_CARE_PROVIDER_SITE_OTHER)
Admission: RE | Admit: 2017-04-08 | Discharge: 2017-04-08 | Disposition: A | Discharge status: Home / Self Care | Payer: Medicare Other | Source: Ambulatory Visit | Attending: Acute Care | Admitting: Acute Care

## 2017-04-08 DIAGNOSIS — F17210 Nicotine dependence, cigarettes, uncomplicated: Secondary | ICD-10-CM | POA: Diagnosis not present

## 2017-04-08 DIAGNOSIS — Z87891 Personal history of nicotine dependence: Secondary | ICD-10-CM | POA: Diagnosis not present

## 2017-04-08 NOTE — Progress Notes (Signed)
Shared Decision Making Visit Lung Cancer Screening Program [phone])   Eligibility:  Age 66 y.o.  Pack Years Smoking History Calculation 64 pack year smoker (# packs/per year x # years smoked)  Recent History of coughing up blood  no  Unexplained weight loss? no ( >Than 15 pounds within the last 6 months )  Prior History Lung / other cancer no (Diagnosis within the last 5 years already requiring surveillance chest CT Scans).  Smoking Status Current Smoker  Former Smokers: Years since quit:  Quit Date: NA  Visit Components:  Discussion included one or more decision making aids. yes  Discussion included risk/benefits of screening. yes  Discussion included potential follow up diagnostic testing for abnormal scans. yes  Discussion included meaning and risk of over diagnosis. yes  Discussion included meaning and risk of False Positives. yes  Discussion included meaning of total radiation exposure. yes  Counseling Included:  Importance of adherence to annual lung cancer LDCT screening. yes  Impact of comorbidities on ability to participate in the program. yes  Ability and willingness to under diagnostic treatment. yes  Smoking Cessation Counseling:  Current Smokers:   Discussed importance of smoking cessation. yes  Information about tobacco cessation classes and interventions provided to patient. yes  Patient provided with "ticket" for LDCT Scan. yes  Symptomatic Patient. no  Counseling  Diagnosis Code: Tobacco Use Z72.0  Asymptomatic Patient yes  Counseling (Intermediate counseling: > three minutes counseling) V4259  Former Smokers:   Discussed the importance of maintaining cigarette abstinence. yes  Diagnosis Code: Personal History of Nicotine Dependence. D63.875  Information about tobacco cessation classes and interventions provided to patient. Yes  Patient provided with "ticket" for LDCT Scan. yes  Written Order for Lung Cancer Screening with LDCT  placed in Epic. Yes (CT Chest Lung Cancer Screening Low Dose W/O CM) [id number] Z12.2-Screening of respiratory organs Z87.891-Personal history of nicotine dependence  I have spent 25 minutes of face to face time with Mr. And Mrs. Vicuna discussing the risks and benefits of lung cancer screening. We viewed a power point together that explained in detail the above noted topics. We paused at intervals to allow for questions to be asked and answered to ensure understanding.We discussed that the single most powerful action that he can take to decrease his risk of developing lung cancer is to quit smoking. We discussed whether or not he  is ready to commit to setting a quit date. He is currently not ready to set a quit date.We discussed options for tools to aid in quitting smoking including nicotine replacement therapy, non-nicotine medications, support groups, Quit Smart classes, and behavior modification. We discussed that often times setting smaller, more achievable goals, such as eliminating 1 cigarette a day for a week and then 2 cigarettes a day for a week can be helpful in slowly decreasing the number of cigarettes smoked. This allows for a sense of accomplishment as well as providing a clinical benefit. I gave him the " Be Stronger Than Your Excuses" card with contact information for community resources, classes, free nicotine replacement therapy, and access to mobile apps, text messaging, and on-line smoking cessation help. I have also given him my card and contact information in the event he needs to contact me. We discussed the time and location of the scan, and that either Doroteo Glassman RN or I will call with the results within 24-48 hours of receiving them. I have offered him a copy of the power point we viewed  as  a resource in the event they need reinforcement of the concepts we discussed today in the office. The patient verbalized understanding of all of  the above and had no further questions upon  leaving the office. They have my contact information in the event they have any further questions.  I spent 4 minutes counseling on smoking cessation and the health risks of continued tobacco abuse.  I explained to the patient that there has been a high incidence of coronary artery disease noted on these exams. I explained that this is a non-gated exam therefore degree or severity cannot be determined. This patient is currently on statin therapy. I have asked the patient to follow-up with their PCP regarding any incidental finding of coronary artery disease and management with diet or medication as their PCP  feels is clinically indicated. The patient verbalized understanding of the above and had no further questions upon completion of the visit.      Magdalen Spatz, NP

## 2017-04-11 ENCOUNTER — Encounter: Payer: Self-pay | Admitting: Cardiology

## 2017-04-11 NOTE — Progress Notes (Deleted)
Cardiology Office Note   Date:  04/11/2017   ID:  Brian, Graham May 13, 1951, MRN [id number]  PCP:  [name], MD  Cardiologist:   Minus Breeding, MD  Referring:  ***  No chief complaint on file.     History of Present Illness: Brian Graham is a 66 y.o. male who is referred by [name], MD for evaluation of CAD.  ***     Past Medical History:  Diagnosis Date  . Abnormal ECG   . Arthritis    "left knee" (08/05/2016)  . Benign essential tremor    "all my life" (08/05/2016)  . CAD (coronary artery disease) of artery bypass graft 08/05/2016   Around age 64. CABG - 3 vessel.   . Colon polyp   . 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 Coronary artery disease   . Essential tremor 06/11/2016   Plans to see Dr. Carles Collet  . Former smoker 09/01/2016   Quit 2012. 40 pack years at least.   . GERD (gastroesophageal reflux disease)   . Headache   . History of blood transfusion 08/1997   "when he had his heart surgery"  . History of shingles 1970-2013 X 3  . HTN (hypertension) 08/05/2016   Amlodipine 5mg , atenolol 50mg  BID, benazepril 20mg   . Hyperlipemia   . Hyperlipidemia 04/04/2017   Atorvastatin 80mg   . Hypothyroidism   . Lumbar disc disease   . Myocardial infarction ([place]) 08/1997  . Rhinitis     Past Surgical History:  Procedure Laterality Date  . BACK SURGERY    . CARDIAC CATHETERIZATION  08/1997  . CORONARY ARTERY BYPASS GRAFT  09/12/1997   "triple"  . FRACTURE SURGERY    . INGUINAL HERNIA REPAIR Right 1961  . KNEE RECONSTRUCTION Left 1960s - 1974 X 4  . LAPAROSCOPIC ABLATION RENAL MASS  ~ 2014   "large abscess/pus ball"  . [place]   "they were wedged  in the bowel"  . PATELLA FRACTURE SURGERY Left 1963  . PATELLA FRACTURE SURGERY Left ~ 1965   "removed knee cap"  . TONSILLECTOMY       Current Outpatient Prescriptions  Medication Sig Dispense Refill  . acetaminophen (TYLENOL) 500 MG tablet Take 1,000 mg by mouth every 6 (six) hours as needed for mild pain.    Marland Kitchen albuterol (PROVENTIL) (2.5 MG/3ML) 0.083% nebulizer solution Take 3 mLs (2.5 mg total) by nebulization every 6 (six) hours as needed for wheezing or shortness of breath. (Patient not taking: Reported on 04/04/2017) 360 mL 11  . amLODipine (NORVASC) 5 MG tablet Take 5 mg by mouth daily.     Marland Kitchen aspirin 81 MG EC tablet Take 1 tablet (81 mg total) by mouth daily. 120 tablet 3  . atenolol (TENORMIN) 50 MG tablet Take 50 mg by mouth daily.     Marland Kitchen atorvastatin (LIPITOR) 80 MG tablet Take 80 mg by mouth daily at 6 PM.     . benazepril (LOTENSIN) 20 MG tablet Take 20 mg by mouth daily.     . budesonide-formoterol (SYMBICORT) 160-4.5 MCG/ACT inhaler Inhale 2 puffs into the lungs 2 (two) times daily.    Marland Kitchen PROAIR HFA 108 (90 Base)  MCG/ACT inhaler Inhale 1-2 puffs into the lungs every 4 (four) hours as needed. 18 g 5  . SYNTHROID 125 MCG tablet Take 125 mcg by mouth daily before breakfast.     . Tiotropium Bromide Monohydrate (SPIRIVA RESPIMAT) 2.5 MCG/ACT AERS Inhale 2 puffs into the lungs daily.     No current facility-administered medications for this visit.     Allergies:   Ciprofloxacin and Omeprazole    Social History:  The patient  reports that he has been smoking Cigarettes.  He has a 45.00 pack-year smoking history. He has never used smokeless tobacco. He reports that he drinks about 8.4 oz of alcohol per week . He reports that he does not use drugs.   Family History:  The patient's ***family history includes Alcohol abuse in his father and mother; Alcoholism in his sister; Healthy in his sister and sister.    ROS:  Please see the history of present illness.   Otherwise, review of  systems are positive for {NONE DEFAULTED:18576::"none"}.   All other systems are reviewed and negative.    PHYSICAL EXAM: VS:  There were no vitals taken for this visit. , BMI There is no height or weight on file to calculate BMI. GENERAL:  Well appearing HEENT:  Pupils equal round and reactive, fundi not visualized, oral mucosa unremarkable NECK:  No jugular venous distention, waveform within normal limits, carotid upstroke brisk and symmetric, no bruits, no thyromegaly LYMPHATICS:  No cervical, inguinal adenopathy LUNGS:  Clear to auscultation bilaterally BACK:  No CVA tenderness CHEST:  Unremarkable HEART:  PMI not displaced or sustained,S1 and S2 within normal limits, no S3, no S4, no clicks, no rubs, *** murmurs ABD:  Flat, positive bowel sounds normal in frequency in pitch, no bruits, no rebound, no guarding, no midline pulsatile mass, no hepatomegaly, no splenomegaly EXT:  2 plus pulses throughout, no edema, no cyanosis no clubbing SKIN:  No rashes no nodules NEURO:  Cranial nerves II through XII grossly intact, motor grossly intact throughout PSYCH:  Cognitively intact, oriented to person place and time    EKG:  EKG {ACTION; IS/IS XBM:84132440} ordered today. The ekg ordered today demonstrates ***   Recent Labs: 06/11/2016: ALT 18; TSH 1.710 08/05/2016: B Natriuretic Peptide 48.3 08/06/2016: BUN 11; Creatinine, Ser 0.72; Hemoglobin 14.2; Platelets 251; Potassium 4.1; Sodium 139    Lipid Panel No results found for: CHOL, TRIG, HDL, CHOLHDL, VLDL, LDLCALC, LDLDIRECT    Wt Readings from Last 3 Encounters:  04/04/17 135 lb 12.8 oz (61.6 kg)  02/02/17 140 lb 3.2 oz (63.6 kg)  10/04/16 143 lb 12.8 oz (65.2 kg)      Other studies Reviewed: Additional studies/ records that were reviewed today include: ***. Review of the above records demonstrates:  Please see elsewhere in the note.  ***   ASSESSMENT AND PLAN:  ***  CAD:  Hisotry of CABG.  ***  HTN:   ***  DYSLIPIDEMIA:  ***   Current medicines are reviewed at length with the patient today.  The patient {ACTIONS; HAS/DOES NOT HAVE:19233} concerns regarding medicines.  The following changes have been made:  {PLAN; NO CHANGE:13088:s}  Labs/ tests ordered today include: *** No orders of the defined types were placed in this encounter.    Disposition:   FU with ***    Signed, Minus Breeding, MD  04/11/2017 9:13 PM    [facility]

## 2017-04-12 ENCOUNTER — Ambulatory Visit: Payer: Medicare Other | Admitting: Cardiology

## 2017-04-13 ENCOUNTER — Other Ambulatory Visit: Payer: Self-pay | Admitting: Acute Care

## 2017-04-13 ENCOUNTER — Encounter: Payer: Self-pay | Admitting: Family Medicine

## 2017-04-13 DIAGNOSIS — F17210 Nicotine dependence, cigarettes, uncomplicated: Secondary | ICD-10-CM

## 2017-04-13 DIAGNOSIS — I700 Atherosclerosis of aorta: Secondary | ICD-10-CM | POA: Insufficient documentation

## 2017-04-15 ENCOUNTER — Emergency Department: Payer: Medicare Other

## 2017-04-15 ENCOUNTER — Emergency Department
Admission: EM | Admit: 2017-04-15 | Discharge: 2017-04-15 | Disposition: A | Discharge status: Home / Self Care | Payer: Medicare Other | Attending: Emergency Medicine | Admitting: Emergency Medicine

## 2017-04-15 DIAGNOSIS — R109 Unspecified abdominal pain: Secondary | ICD-10-CM

## 2017-04-15 DIAGNOSIS — F17210 Nicotine dependence, cigarettes, uncomplicated: Secondary | ICD-10-CM | POA: Insufficient documentation

## 2017-04-15 DIAGNOSIS — N10 Acute tubulo-interstitial nephritis: Secondary | ICD-10-CM | POA: Diagnosis not present

## 2017-04-15 DIAGNOSIS — I2510 Atherosclerotic heart disease of native coronary artery without angina pectoris: Secondary | ICD-10-CM | POA: Insufficient documentation

## 2017-04-15 DIAGNOSIS — Z7982 Long term (current) use of aspirin: Secondary | ICD-10-CM | POA: Insufficient documentation

## 2017-04-15 DIAGNOSIS — M545 Low back pain: Secondary | ICD-10-CM | POA: Diagnosis present

## 2017-04-15 DIAGNOSIS — Z79899 Other long term (current) drug therapy: Secondary | ICD-10-CM | POA: Insufficient documentation

## 2017-04-15 DIAGNOSIS — E0590 Thyrotoxicosis, unspecified without thyrotoxic crisis or storm: Secondary | ICD-10-CM | POA: Diagnosis not present

## 2017-04-15 DIAGNOSIS — I252 Old myocardial infarction: Secondary | ICD-10-CM | POA: Insufficient documentation

## 2017-04-15 DIAGNOSIS — J449 Chronic obstructive pulmonary disease, unspecified: Secondary | ICD-10-CM | POA: Diagnosis not present

## 2017-04-15 DIAGNOSIS — Z951 Presence of aortocoronary bypass graft: Secondary | ICD-10-CM | POA: Diagnosis not present

## 2017-04-15 DIAGNOSIS — I10 Essential (primary) hypertension: Secondary | ICD-10-CM | POA: Insufficient documentation

## 2017-04-15 DIAGNOSIS — R1030 Lower abdominal pain, unspecified: Secondary | ICD-10-CM | POA: Insufficient documentation

## 2017-04-15 DIAGNOSIS — N12 Tubulo-interstitial nephritis, not specified as acute or chronic: Secondary | ICD-10-CM

## 2017-04-15 LAB — URINALYSIS, COMPLETE (UACMP) WITH MICROSCOPIC
BILIRUBIN URINE: NEGATIVE
GLUCOSE, UA: NEGATIVE mg/dL
Hgb urine dipstick: NEGATIVE
KETONES UR: NEGATIVE mg/dL
NITRITE: NEGATIVE
PROTEIN: NEGATIVE mg/dL
Specific Gravity, Urine: 1.01 (ref 1.005–1.030)
Squamous Epithelial / LPF: NONE SEEN
pH: 5 (ref 5.0–8.0)

## 2017-04-15 LAB — COMPREHENSIVE METABOLIC PANEL
ALBUMIN: 4.2 g/dL (ref 3.5–5.0)
ALK PHOS: 70 U/L (ref 38–126)
ALT: 17 U/L (ref 17–63)
AST: 26 U/L (ref 15–41)
Anion gap: 5 (ref 5–15)
BUN: 12 mg/dL (ref 6–20)
CALCIUM: 9.1 mg/dL (ref 8.9–10.3)
CHLORIDE: 104 mmol/L (ref 101–111)
CO2: 31 mmol/L (ref 22–32)
CREATININE: 0.83 mg/dL (ref 0.61–1.24)
GFR calc Af Amer: >60 mL/min (ref >60)
GFR calc non Af Amer: >60 mL/min (ref >60)
GLUCOSE: 94 mg/dL (ref 65–99)
Potassium: 3.6 mmol/L (ref 3.5–5.1)
SODIUM: 140 mmol/L (ref 135–145)
Total Bilirubin: 0.6 mg/dL (ref 0.3–1.2)
Total Protein: 7.5 g/dL (ref 6.5–8.1)

## 2017-04-15 LAB — LIPASE, BLOOD: Lipase: 28 U/L (ref 11–51)

## 2017-04-15 LAB — CBC WITH DIFFERENTIAL/PLATELET
BASOS ABS: 0.1 10*3/uL (ref 0–0.1)
BASOS PCT: 1 %
EOS ABS: 0.2 10*3/uL (ref 0–0.7)
Eosinophils Relative: 2 %
HCT: 44.5 % (ref 40.0–52.0)
HEMOGLOBIN: 14.8 g/dL (ref 13.0–18.0)
Lymphocytes Relative: 11 %
Lymphs Abs: 1.5 10*3/uL (ref 1.0–3.6)
MCH: 31.6 pg (ref 26.0–34.0)
MCHC: 33.2 g/dL (ref 32.0–36.0)
MCV: 95.1 fL (ref 80.0–100.0)
Monocytes Absolute: 1.1 10*3/uL — ABNORMAL HIGH (ref 0.2–1.0)
Monocytes Relative: 8 %
NEUTROS PCT: 78 %
Neutro Abs: 10.5 10*3/uL — ABNORMAL HIGH (ref 1.4–6.5)
Platelets: 292 10*3/uL (ref 150–440)
RBC: 4.67 MIL/uL (ref 4.40–5.90)
RDW: 13.8 % (ref 11.5–14.5)
WBC: 13.4 10*3/uL — AB (ref 3.8–10.6)

## 2017-04-15 MED ORDER — MORPHINE SULFATE (PF) 4 MG/ML IV SOLN
4.0000 mg | Freq: Once | INTRAVENOUS | Status: AC
  Administered 2017-04-15: 4 mg via INTRAVENOUS

## 2017-04-15 MED ORDER — SODIUM CHLORIDE 0.9 % IV BOLUS (SEPSIS)
1000.0000 mL | Freq: Once | INTRAVENOUS | Status: AC
  Administered 2017-04-15: 1000 mL via INTRAVENOUS

## 2017-04-15 MED ORDER — CEPHALEXIN 500 MG PO CAPS: 500.0000 mg | ORAL_CAPSULE | Freq: Three times a day (TID) | ORAL | 0 refills | Status: DC

## 2017-04-15 MED ORDER — ONDANSETRON HCL 4 MG/2ML IJ SOLN
4.0000 mg | Freq: Once | INTRAMUSCULAR | Status: AC
  Administered 2017-04-15: 4 mg via INTRAVENOUS
  Filled 2017-04-15: qty 2

## 2017-04-15 MED ORDER — KETOROLAC TROMETHAMINE 30 MG/ML IJ SOLN
15.0000 mg | Freq: Once | INTRAMUSCULAR | Status: AC
  Administered 2017-04-15: 15 mg via INTRAVENOUS
  Filled 2017-04-15: qty 1

## 2017-04-15 MED ORDER — CEFTRIAXONE SODIUM IN DEXTROSE 20 MG/ML IV SOLN
1.0000 g | Freq: Once | INTRAVENOUS | Status: AC
  Administered 2017-04-15: 1 g via INTRAVENOUS
  Filled 2017-04-15: qty 50

## 2017-04-15 MED ORDER — FENTANYL CITRATE (PF) 100 MCG/2ML IJ SOLN
100.0000 ug | Freq: Once | INTRAMUSCULAR | Status: AC
  Administered 2017-04-15: 100 ug via INTRAVENOUS
  Filled 2017-04-15: qty 2

## 2017-04-15 MED ORDER — OXYCODONE-ACETAMINOPHEN 5-325 MG PO TABS: 1.0000 | ORAL_TABLET | Freq: Four times a day (QID) | ORAL | 0 refills | Status: DC | PRN

## 2017-04-15 MED ORDER — OXYCODONE-ACETAMINOPHEN 5-325 MG PO TABS
2.0000 | ORAL_TABLET | Freq: Once | ORAL | Status: AC
  Administered 2017-04-15: 2 via ORAL
  Filled 2017-04-15: qty 2

## 2017-04-15 MED ORDER — MORPHINE SULFATE (PF) 4 MG/ML IV SOLN
INTRAVENOUS | Status: AC
  Filled 2017-04-15: qty 1

## 2017-04-15 NOTE — ED Provider Notes (Signed)
[facility] Emergency Department Provider Note  Time seen: 1:35 AM  I have reviewed the triage vital signs and the nursing notes.   HISTORY  Chief Complaint Flank Pain    HPI Brian Graham is a 66 y.o. male with a past medical history of COPD, hypertension, hyperlipidemia, kidney stones, MI, presents to the emergency department with left flank pain and lower back pain. According to the patient he was diagnosed with a kidney stone last month, states it never passed but his symptoms went away. He states around 3 PM today he developed sharp pain in his left groin, states this lasted several minutes but then went away. Around 9 PM tonight he developed severe pain in his lower back which she states is left and right-sided. States he is also been having pain when he urinates over the past week or 2. Denies any blood or odor. States nausea currently but denies vomiting or diarrhea. Denies chest pain or trouble breathing. States he feels like the pain radiates into the groin but denies any pain in his genitals at this time.  Past Medical History:  Diagnosis Date  . Abnormal ECG   . Arthritis    "left knee" (08/05/2016)  . Benign essential tremor    "all my life" (08/05/2016)  . CAD (coronary artery disease) of artery bypass graft 08/05/2016   Around age 1. CABG - 3 vessel.   . Colon polyp   . 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 Coronary artery disease   . Essential tremor 06/11/2016   Plans to see Dr. Carles Collet  . Former smoker 09/01/2016   Quit 2012. 40 pack years at least.   . GERD (gastroesophageal reflux disease)   . Headache   . History of blood transfusion 08/1997   "when he had his heart  surgery"  . History of shingles 1970-2013 X 3  . HTN (hypertension) 08/05/2016   Amlodipine 5mg , atenolol 50mg  BID, benazepril 20mg   . Hyperlipemia   . Hyperlipidemia 04/04/2017   Atorvastatin 80mg   . Hypothyroidism   . Lumbar disc disease   . Myocardial infarction ([place]) 08/1997  . Rhinitis     Patient Active Problem List   Diagnosis Date Noted  . Aortic atherosclerosis ([place]) 04/13/2017  . Hyperlipidemia 04/04/2017  . Hypothyroidism 04/04/2017  . Seasonal allergies 04/04/2017  . Myalgia 10/04/2016  . Former smoker 09/01/2016  . 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 Dysuria 08/23/2016  . Chronic pain of both knees 08/23/2016  . Hx of CABG 08/05/2016  . CAD (coronary artery disease) of artery bypass graft 08/05/2016  . HTN (hypertension) 08/05/2016  . Essential tremor 06/11/2016  . Tension headache 06/11/2016    Past Surgical History:  Procedure Laterality Date  . BACK SURGERY    . CARDIAC CATHETERIZATION  08/1997  . CORONARY ARTERY BYPASS GRAFT  09/12/1997   "triple"  . FRACTURE SURGERY    . INGUINAL HERNIA REPAIR Right 1961  . KNEE RECONSTRUCTION Left 1960s - 1974 X 4  . LAPAROSCOPIC ABLATION RENAL MASS  ~ 2014   "large abscess/pus ball"  . [place]   "they were wedged in the bowel"  . PATELLA FRACTURE SURGERY Left 1963  .  PATELLA FRACTURE SURGERY Left ~ 1965   "removed knee cap"  . TONSILLECTOMY      Prior to Admission medications   Medication Sig Start Date End Date Taking? Authorizing Provider  acetaminophen (TYLENOL) 500 MG tablet Take 1,000 mg by mouth every 6 (six) hours as needed for mild pain.    [provider]  albuterol (PROVENTIL) (2.5 MG/3ML) 0.083% nebulizer solution Take 3 mLs (2.5 mg total) by nebulization every 6 (six) hours as needed for wheezing or shortness of breath. Patient not taking: Reported on 04/04/2017 02/02/17   Juanito Doom, MD  amLODipine (NORVASC) 5 MG tablet Take 5 mg by mouth daily.  11/20/15   [provider]  aspirin 81 MG EC tablet Take 1 tablet (81 mg total) by mouth daily. 08/07/16   Minus Liberty, MD  atenolol (TENORMIN) 50 MG tablet Take 50 mg by mouth daily.  05/10/16   [provider]  atorvastatin (LIPITOR) 80 MG tablet Take 80 mg by mouth daily at 6 PM.  05/10/16   [provider]  benazepril (LOTENSIN) 20 MG tablet Take 20 mg by mouth daily.  05/26/16   [provider]  budesonide-formoterol (SYMBICORT) 160-4.5 MCG/ACT inhaler Inhale 2 puffs into the lungs 2 (two) times daily.    [provider]  PROAIR HFA 108 [phone] Base) MCG/ACT inhaler Inhale 1-2 puffs into the lungs every 4 (four) hours as needed. 09/01/16   Juanito Doom, MD  SYNTHROID 125 MCG tablet Take 125 mcg by mouth daily before breakfast.  05/10/16   [provider]  Tiotropium Bromide Monohydrate (SPIRIVA RESPIMAT) 2.5 MCG/ACT AERS Inhale 2 puffs into the lungs daily.    [provider]    Allergies  Allergen Reactions  . Ciprofloxacin Diarrhea  . Omeprazole Diarrhea    Family History  Problem Relation Age of Onset  . Alcohol abuse Mother   . Alcohol abuse Father        not involved in life  . Alcoholism Sister   . Healthy Sister   . Healthy Sister     Social History Social History  Substance Use Topics  . Smoking status: Current Every Day Smoker    Packs/day: 1.00    Years: 45.00    Types: Cigarettes    Last attempt to quit: 08/03/2011  . Smokeless tobacco: Never Used     Comment: Now is smoking 10 cigarettes daily  . Alcohol use 8.4 oz/week    14 Shots of liquor per week     Comment: 08/05/2016 "2 shots of vodka/night"    Review of Systems Constitutional: Negative for fever. Cardiovascular: Negative for chest pain. Respiratory: Negative for shortness of breath. Gastrointestinal: Left lower abdominal pain now resolved, lower back pain. Genitourinary: Positive for dysuria. Negative for hematuria Musculoskeletal: Sharp pain across  his back, left and right-sided. Skin: Negative for rash. Neurological: Negative for headache All other ROS negative  ____________________________________________   PHYSICAL EXAM:  VITAL SIGNS: ED Triage Vitals  Enc Vitals Group     BP 04/15/17 0117 (!) 154/76     Pulse Rate 04/15/17 0117 80     Resp 04/15/17 0117 18     Temp 04/15/17 0117 98 F (36.7 C)     Temp Source 04/15/17 0117 Oral     SpO2 04/15/17 0117 100 %     Weight 04/15/17 0118 135 lb (61.2 kg)     Height 04/15/17 0118 5\' 9"  (1.753 m)     Head Circumference --  Peak Flow --      Pain Score 04/15/17 0117 10     Pain Loc --      Pain Edu? --      Excl. in [place]? --     Constitutional: Alert and oriented. Mild distress due to pain. Eyes: Normal exam ENT   Head: Normocephalic and atraumatic.   Mouth/Throat: Mucous membranes are moist. Cardiovascular: Normal rate, regular rhythm. No murmur Respiratory: Normal respiratory effort without tachypnea nor retractions. Breath sounds are clear  Gastrointestinal: Soft and nontender. No distention. No abdominal tenderness on exam. Musculoskeletal: Nontender with normal range of motion in all extremities. Neurologic:  Normal speech and language. No gross focal neurologic deficits  Skin:  Skin is warm, dry and intact.  Psychiatric: Mood and affect are normal.   ____________________________________________     RADIOLOGY  MPRESSION: 1. Left-sided pelvic kidney, with apparent chronic severe left-sided hydronephrosis, worsened from January. This appears to reflect a stricture or obstruction at the ureteropelvic junction. 2. Soft tissue inflammation about the pelvic kidney and associated vasculature could reflect pyelonephritis. Would correlate with the patient's symptoms. 3. Scattered coronary artery calcifications noted. 4. Diffuse aortic atherosclerosis.  ____________________________________________   INITIAL IMPRESSION / ASSESSMENT AND PLAN / ED  COURSE  Pertinent labs & imaging results that were available during my care of the patient were reviewed by me and considered in my medical decision making (see chart for details).  Patient presents to the emergency department with left groin pain now with back pain, he also states he is having difficulty urinating and he has been experiencing dysuria. Patient has a history of kidney stones, states this feels somewhat similar. We will check labs, CT renal scan to further evaluate. We'll treat pain and nausea while awaiting results.  CT shows chronic hydronephrosis of the left pelvic kidney although somewhat worse from prior. There is also soft tissue inflammation around the left kidney which could possibly represent pyelonephritis. Patient's urinalysis shows white blood cell clumps with 6-30 white blood cells, will cover with IV Rocephin, discharged on antibiotics and send a urine culture. I discussed the worsening hydronephrosis with the patient and the need to follow-up with his urologist. He states he will call today to arrange an appointment this coming week. I discussed return precautions for any worsening pain or fever. Patient agreeable.  ____________________________________________   FINAL CLINICAL IMPRESSION(S) / ED DIAGNOSES  Pyelonephritis    Harvest Dark, MD 04/15/17 [phone]

## 2017-04-15 NOTE — ED Triage Notes (Signed)
Pt in with co low back pain radiating to left lower abd states started 3 hrs ago, dx with kidney stones last month.

## 2017-04-17 LAB — URINE CULTURE

## 2017-04-18 NOTE — Progress Notes (Signed)
ED Culture Results  Urine culture: 50k CFU E. 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 Spoke with MD Cinda Quest and recommended Amoxcillin. MD would like to start Amoxicillin 500 mg PO TID x 14days.   Called patient to inform and get pharmacy information; however no answer. Will need to attempt calling again.  Larene Beach, PharmD

## 2017-04-19 ENCOUNTER — Other Ambulatory Visit: Payer: Self-pay | Admitting: Family Medicine

## 2017-04-19 DIAGNOSIS — Z87891 Personal history of nicotine dependence: Secondary | ICD-10-CM

## 2017-04-19 DIAGNOSIS — Z136 Encounter for screening for cardiovascular disorders: Secondary | ICD-10-CM

## 2017-04-19 NOTE — Progress Notes (Signed)
ED Culture Results  Urine culture: 50k CFU E. 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 Spoke with MD Cinda Quest and recommended Amoxcillin. MD would like to start Amoxicillin 500 mg PO TID x 14days.   Called patient to inform and get pharmacy information; however no answer. Will need to attempt calling again.  Larene Beach, PharmD   04/19/17 Contacted patient and called Rx into [facility] [phone]). Advised patient to stop Keflex and counseled on amoxicillin. Patient was worried about infection as he has recently lost some weight but has also been unable to eat because of back pain. Advised patient to f/u with PCP if abx does not help with his symptoms.  Darrow Bussing, PharmD Pharmacy Resident 04/19/2017 2:16 PM

## 2017-04-22 ENCOUNTER — Ambulatory Visit (INDEPENDENT_AMBULATORY_CARE_PROVIDER_SITE_OTHER): Payer: Medicare Other | Admitting: Family Medicine

## 2017-04-22 ENCOUNTER — Encounter: Payer: Self-pay | Admitting: Family Medicine

## 2017-04-22 VITALS — BP 130/64 | HR 65 | Temp 98.5°F | Ht 69.0 in | Wt 133.6 lb

## 2017-04-22 DIAGNOSIS — I25810 Atherosclerosis of coronary artery bypass graft(s) without angina pectoris: Secondary | ICD-10-CM

## 2017-04-22 DIAGNOSIS — Z86010 Personal history of colonic polyps: Secondary | ICD-10-CM | POA: Diagnosis not present

## 2017-04-22 DIAGNOSIS — D72829 Elevated white blood cell count, unspecified: Secondary | ICD-10-CM

## 2017-04-22 DIAGNOSIS — E034 Atrophy of thyroid (acquired): Secondary | ICD-10-CM

## 2017-04-22 DIAGNOSIS — R739 Hyperglycemia, unspecified: Secondary | ICD-10-CM

## 2017-04-22 DIAGNOSIS — R634 Abnormal weight loss: Secondary | ICD-10-CM

## 2017-04-22 DIAGNOSIS — Z860101 Personal history of adenomatous and serrated colon polyps: Secondary | ICD-10-CM | POA: Insufficient documentation

## 2017-04-22 DIAGNOSIS — F17200 Nicotine dependence, unspecified, uncomplicated: Secondary | ICD-10-CM | POA: Diagnosis not present

## 2017-04-22 DIAGNOSIS — N12 Tubulo-interstitial nephritis, not specified as acute or chronic: Secondary | ICD-10-CM | POA: Diagnosis not present

## 2017-04-22 LAB — CBC WITH DIFFERENTIAL/PLATELET
BASOS ABS: 0.1 10*3/uL (ref 0.0–0.1)
Basophils Relative: 0.6 % (ref 0.0–3.0)
EOS ABS: 0.2 10*3/uL (ref 0.0–0.7)
Eosinophils Relative: 2.5 % (ref 0.0–5.0)
HCT: 42.9 % (ref 39.0–52.0)
Hemoglobin: 14.5 g/dL (ref 13.0–17.0)
LYMPHS ABS: 1.6 10*3/uL (ref 0.7–4.0)
Lymphocytes Relative: 19.2 % (ref 12.0–46.0)
MCHC: 33.7 g/dL (ref 30.0–36.0)
MCV: 94.3 fl (ref 78.0–100.0)
MONO ABS: 0.9 10*3/uL (ref 0.1–1.0)
Monocytes Relative: 10.7 % (ref 3.0–12.0)
NEUTROS PCT: 67 % (ref 43.0–77.0)
Neutro Abs: 5.7 10*3/uL (ref 1.4–7.7)
Platelets: 425 10*3/uL — ABNORMAL HIGH (ref 150.0–400.0)
RBC: 4.55 Mil/uL (ref 4.22–5.81)
RDW: 14.1 % (ref 11.5–15.5)
WBC: 8.6 10*3/uL (ref 4.0–10.5)

## 2017-04-22 LAB — TSH: TSH: 3.42 u[IU]/mL (ref 0.35–4.50)

## 2017-04-22 LAB — HEMOGLOBIN A1C: HEMOGLOBIN A1C: 6.1 % (ref 4.6–6.5)

## 2017-04-22 NOTE — Progress Notes (Signed)
Subjective:  Brian Graham is a 66 y.o. year old very pleasant male patient who presents for/with See problem oriented charting ROS- feels fatigued having weight loss. No recurrent night sweats. No chest pain or shortness of breath   Past Medical History-  Patient Active Problem List   Diagnosis Date Noted  . Former smoker 09/01/2016    Priority: High  . COPD, severe ([place]) 08/23/2016    Priority: High  . Hx of CABG 08/05/2016    Priority: High  . CAD (coronary artery disease) of artery bypass graft 08/05/2016    Priority: High  . History of adenomatous polyp of colon 04/22/2017    Priority: Medium  . Hyperlipidemia 04/04/2017    Priority: Medium  . Hypothyroidism 04/04/2017    Priority: Medium  . HTN (hypertension) 08/05/2016    Priority: Medium  . Essential tremor 06/11/2016    Priority: Medium  . Seasonal allergies 04/04/2017    Priority: Low  . Myalgia 10/04/2016    Priority: Low  . Dysuria 08/23/2016    Priority: Low  . Chronic pain of both knees 08/23/2016    Priority: Low  . Tension headache 06/11/2016    Priority: Low  . Pyelonephritis 04/23/2017  . Weight loss 04/23/2017  . Aortic atherosclerosis ([place]) 04/13/2017    Medications- reviewed and updated Current Outpatient Prescriptions  Medication Sig Dispense Refill  . acetaminophen (TYLENOL) 500 MG tablet Take 1,000 mg by mouth every 6 (six) hours as needed for mild pain.    Marland Kitchen albuterol (PROVENTIL) (2.5 MG/3ML) 0.083% nebulizer solution Take 3 mLs (2.5 mg total) by nebulization every 6 (six) hours as needed for wheezing or shortness of breath. 360 mL 11  . amLODipine (NORVASC) 5 MG tablet Take 5 mg by mouth daily.     Marland Kitchen amoxicillin (AMOXIL) 500 MG capsule Take 500 mg by mouth 3 (three) times daily.  0  . aspirin 81 MG EC tablet Take 1 tablet (81 mg total) by mouth daily. 120 tablet 3  . atenolol (TENORMIN) 50 MG tablet Take 50 mg by mouth daily.     Marland Kitchen atorvastatin (LIPITOR) 80 MG tablet Take 80 mg by  mouth daily at 6 PM.     . benazepril (LOTENSIN) 20 MG tablet Take 20 mg by mouth daily.     . budesonide-formoterol (SYMBICORT) 160-4.5 MCG/ACT inhaler Inhale 2 puffs into the lungs 2 (two) times daily.    Marland Kitchen PROAIR HFA 108 (90 Base) MCG/ACT inhaler Inhale 1-2 puffs into the lungs every 4 (four) hours as needed. 18 g 5  . SYNTHROID 125 MCG tablet Take 125 mcg by mouth daily before breakfast.     . Tiotropium Bromide Monohydrate (SPIRIVA RESPIMAT) 2.5 MCG/ACT AERS Inhale 2 puffs into the lungs daily.     No current facility-administered medications for this visit.     Objective: BP 130/64 (BP Location: Left Arm, Patient Position: Sitting, Cuff Size: Large)   Pulse 65   Temp 98.5 F (36.9 C) (Oral)   Ht 5\' 9"  (1.753 m)   Wt 133 lb 9.6 oz (60.6 kg)   SpO2 95%   BMI 19.73 kg/m  Gen: NAD, resting comfortably CV: RRR no murmurs rubs or gallops Lungs: CTAB no crackles, wheeze, rhonchi Abdomen: soft/some pain with deep palpation LLQ, RLQ, suprapubic/nondistended/normal bowel sounds. No rebound or guarding.  Ext: no edema Skin: warm, dry  Assessment/Plan:  History of adenomatous polyp of colon We discussed would need to get him plugged into GI this year for repeat  colonoscopy given prior tubular adenoma. With decreased appetite/early satiety- would ask for their opinion on EGD as well. Likely place this referral at 3 week follow up.   Pyelonephritis S:Patient was recently seen in ED for Left and right sided back pain 04/15/17 and was found to have UTI vs. Possible pyelonephritis. Given dose of rocephin. Started on keflex but culture grew 50k colonies of enterococcus. He is now on 2 week course of amoxicillin 500mg  TID and Starting to feel better. He was advised to see Dr. Tresa Moore within a week or so due to worsening hydronephrosis and possible stricture or obstruction of uretopelvic junction. Apparently patient needed catheter to be able to urinate while at the emergency room. He reports  Cystoscopy about a month ago though. Tried to get appointment with Dr. Tresa Moore but could not until August. We will see if we can get him in sooner.   This is now his 3rd UTI this year. 03/15/17- UTI treated with cipro and later augmentin [facility] (to be [facility] in)  He continues to have Early satiety/low appetite. We had a long discussion that his current illness could cause this as well as the antibitoics. He is down 2 lbs in 2 weeks. Records show celiac panel negative 11/22/15  A/P: Recent pyelonephritis based off of scan below and improvement on amoxicillin 500mg  TID for enterococcus in urine (see culture). Continue antibiotics- with possible stricture we have reached out to urology to see if they can get him in sooner than august- they are willing to see PA/NP at this point. Recurrent UTIs- ? If he needs to be on prophylaxis.   Decreased appetite thought due to current illness.   "Ct Renal Stone Study Result Date: 04/15/2017 CLINICAL DATA:  Acute onset of lower back pain, radiating to the left lower abdomen. EXAM: CT ABDOMEN AND PELVIS WITHOUT CONTRAST TECHNIQUE: Multidetector CT imaging of the abdomen and pelvis was performed following the standard protocol without IV contrast. COMPARISON:  CT of the abdomen and pelvis from 12/14/2016 FINDINGS: Lower chest: Scattered coronary artery calcifications seen. The visualized lung bases are grossly clear. Hepatobiliary: The liver is unremarkable in appearance. The gallbladder is unremarkable in appearance. The common bile duct remains normal in caliber. Pancreas: The pancreas is within normal limits. A prominent pancreatic tail extends to the splenic hilum. Spleen: The spleen is unremarkable in appearance. Adrenals/Urinary Tract: The adrenal glands are unremarkable in appearance. The right kidney is unremarkable. There is no evidence of hydronephrosis. No renal or ureteral stones are identified. No perinephric stranding is seen. A left-sided  pelvic kidney is noted, with chronic severe left-sided hydronephrosis. This appears to reflect a stricture or obstruction at the ureteropelvic junction. Soft tissue inflammation about the pelvic kidney and associated vasculature could reflect pyelonephritis. Would correlate with the patient's symptoms. Stomach/Bowel: The stomach is unremarkable in appearance. The small bowel is within normal limits. The appendix is normal in caliber, without evidence of appendicitis. The colon is unremarkable in appearance. Vascular/Lymphatic: Diffuse calcification is seen along the abdominal aorta and its branches. The abdominal aorta is otherwise grossly unremarkable. The inferior vena cava is grossly unremarkable. No retroperitoneal lymphadenopathy is seen. No pelvic sidewall lymphadenopathy is identified. Reproductive: The bladder is mildly distended and grossly unremarkable. The prostate remains normal in size, with minimal calcification. Other: No additional soft tissue abnormalities are seen. Musculoskeletal: No acute osseous abnormalities are identified. The visualized musculature is unremarkable in appearance. IMPRESSION: 1. Left-sided pelvic kidney, with apparent chronic severe left-sided hydronephrosis, worsened  from January. This appears to reflect a stricture or obstruction at the ureteropelvic junction. 2. Soft tissue inflammation about the pelvic kidney and associated vasculature could reflect pyelonephritis. Would correlate with the patient's symptoms. 3. Scattered coronary artery calcifications noted. 4. Diffuse aortic atherosclerosis. Electronically Signed   By: Garald Balding M.D.   On: 04/15/2017 02:39   "  Weight loss S: WBC elevation in ED up to 13.4 with left shift. Had prior elevations- will add on path smear review. Consider hematology. WBC runs in 10 or greater Noted at least back to 02/28/13 in [facility] but usually high normal.  Weight was 159 at time of this lab. In 11/2013 he complained of weight  loss when weight was down to 156 from 159. He had some leukocytes in urien at that point. Treated for UTI 04/27/2014 with levaquin. By 12/05/14 weight was down to 149. By 06/10/16 weight was down to 143. BPH was noted on 11/20/15 note.  A/P: Suspect his decreased appetite at present is due to infection/illness. 3 week follow up to make sure not losing more weight. Has already had CT chest (lung cancer screening) and CT abd/pelvis for other reasons and no obvious mass. Likely to update colonoscopy at follow up. Already had EGD and colonoscoy 06/2012- adenoma and mild gastritis only. Prior leukocytosis - ? Hematologic issue- but has resolved on check today. Also getting pathology smear review.   Other info fromrecords: July 05, 2012 colonoscopy and EGD with Dr. Raeford Razor for GI issues.  Did have some mild chronic gastritis.did have 1 tubular adenoma. He was tested for H. PYlori and was negative by EGD. Found to have left kidney stone or cyst- ultimately required surgery and stent placement to help remove purulent material and stone. Consideration of nephrectomy had been noted by urology in [place]. Patients appetite finaly returned noted 03/2013 after stent placement January 2014. He had significant weight loss at that time.   At present- He still complains of burping/belching. He also had this issue before in 2013 and -->Trial of probiotics and levsin 0.125mg  SL. Ordered IBD panel, crp, tsh, cbc, bmp, cta bd/pelvis- was having abdominal pain at that time.   Hypothyroidism S:Noted TSH elevation in 2013 up to nearly 30. Was at goal 06/11/16. Will update today A/P: TSH came back normal. TSH not cause of weight loss Lab Results  Component Value Date   TSH 3.42 04/22/2017      CAD (coronary artery disease) of artery bypass graft From records review had stress echo 01/23/13. "intermediate risk study secondary to poor exercise tolerance with grossly normal stress echo withotu evidence for inducible  ischemia"  3 week follow up  Orders Placed This Encounter  Procedures  . TSH    [place]  . CBC with Differential/Platelet  . Pathologist smear review  . Hemoglobin A1c        Meds ordered this encounter  Medications  . amoxicillin (AMOXIL) 500 MG capsule    Sig: Take 500 mg by mouth 3 (three) times daily.    Refill:  0   The duration of face-to-face time during this visit was greater than 45 minutes. Greater than 50% of this time was spent in counseling, explanation of diagnosis, planning of further management, and/or coordination of care including review of records and details with patient from [place], discussion of his weight loss and concerns there- review of prior imaging and follow up steps, discussing fact his weight loss very well could be due to a number of illnesses/infections  he has had, trying to get him into urology sooner.     Return precautions advised.  Garret Reddish, MD

## 2017-04-22 NOTE — Patient Instructions (Addendum)
I thought you had quit smoking?  Urology was called- they stated they would have Dr. Tresa Moore review Ct and try to get you in sooner. You should hear from them by next Wednesday at the [facility]- call us back if you have not heard.   Lets check back in 3 weeks from now (after antibiotics are out of system) to see how you and appetite are doing and consider follow up labs.   Would consider repeat GI consult locally  Any night sweats? Any fevers? Considering hematology consult depending on how you do  Please stop by lab before you go

## 2017-04-23 DIAGNOSIS — R634 Abnormal weight loss: Secondary | ICD-10-CM | POA: Insufficient documentation

## 2017-04-23 DIAGNOSIS — N12 Tubulo-interstitial nephritis, not specified as acute or chronic: Secondary | ICD-10-CM | POA: Insufficient documentation

## 2017-04-23 NOTE — Assessment & Plan Note (Signed)
S:Noted TSH elevation in 2013 up to nearly 30. Was at goal 06/11/16. Will update today A/P: TSH came back normal. TSH not cause of weight loss Lab Results  Component Value Date   TSH 3.42 04/22/2017

## 2017-04-23 NOTE — Assessment & Plan Note (Signed)
Reviewed risks of smoking. He is losing weight- advised him to stop smoking for overall health including CAD and cancer risk.

## 2017-04-23 NOTE — Assessment & Plan Note (Signed)
From records review had stress echo 01/23/13. "intermediate risk study secondary to poor exercise tolerance with grossly normal stress echo withotu evidence for inducible ischemia"

## 2017-04-23 NOTE — Assessment & Plan Note (Addendum)
S:Brian Graham was recently seen in ED for Left and right sided back pain 04/15/17 and was found to have UTI vs. Possible pyelonephritis. Given dose of rocephin. Started on keflex but culture grew 50k colonies of enterococcus. He is now on 2 week course of amoxicillin 500mg  TID and Starting to feel better. He was advised to see Dr. Tresa Moore within a week or so due to worsening hydronephrosis and possible stricture or obstruction of uretopelvic junction. Apparently Brian Graham needed catheter to be able to urinate while at the emergency room. He reports Cystoscopy about a month ago though. Tried to get appointment with Dr. Tresa Moore but could not until August. We will see if we can get him in sooner.   This is now his 3rd UTI this year. 03/15/17- UTI treated with cipro and later augmentin [facility] (to be [facility] in)  He continues to have Early satiety/low appetite. We had a long discussion that his current illness could cause this as well as the antibitoics. He is down 2 lbs in 2 weeks. Records show celiac panel negative 11/22/15  A/P: Recent pyelonephritis based off of scan below and improvement on amoxicillin 500mg  TID for enterococcus in urine (see culture). Continue antibiotics- with possible stricture we have reached out to urology to see if they can get him in sooner than august- they are willing to see PA/NP at this point. Recurrent UTIs- ? If he needs to be on prophylaxis.   Decreased appetite thought due to current illness.   "Ct Renal Stone Study Result Date: 04/15/2017 CLINICAL DATA:  Acute onset of lower back pain, radiating to the left lower abdomen. EXAM: CT ABDOMEN AND PELVIS WITHOUT CONTRAST TECHNIQUE: Multidetector CT imaging of the abdomen and pelvis was performed following the standard protocol without IV contrast. COMPARISON:  CT of the abdomen and pelvis from 12/14/2016 FINDINGS: Lower chest: Scattered coronary artery calcifications seen. The visualized lung bases are grossly clear.  Hepatobiliary: The liver is unremarkable in appearance. The gallbladder is unremarkable in appearance. The common bile duct remains normal in caliber. Pancreas: The pancreas is within normal limits. A prominent pancreatic tail extends to the splenic hilum. Spleen: The spleen is unremarkable in appearance. Adrenals/Urinary Tract: The adrenal glands are unremarkable in appearance. The right kidney is unremarkable. There is no evidence of hydronephrosis. No renal or ureteral stones are identified. No perinephric stranding is seen. A left-sided pelvic kidney is noted, with chronic severe left-sided hydronephrosis. This appears to reflect a stricture or obstruction at the ureteropelvic junction. Soft tissue inflammation about the pelvic kidney and associated vasculature could reflect pyelonephritis. Would correlate with the Brian Graham's symptoms. Stomach/Bowel: The stomach is unremarkable in appearance. The small bowel is within normal limits. The appendix is normal in caliber, without evidence of appendicitis. The colon is unremarkable in appearance. Vascular/Lymphatic: Diffuse calcification is seen along the abdominal aorta and its branches. The abdominal aorta is otherwise grossly unremarkable. The inferior vena cava is grossly unremarkable. No retroperitoneal lymphadenopathy is seen. No pelvic sidewall lymphadenopathy is identified. Reproductive: The bladder is mildly distended and grossly unremarkable. The prostate remains normal in size, with minimal calcification. Other: No additional soft tissue abnormalities are seen. Musculoskeletal: No acute osseous abnormalities are identified. The visualized musculature is unremarkable in appearance. IMPRESSION: 1. Left-sided pelvic kidney, with apparent chronic severe left-sided hydronephrosis, worsened from January. This appears to reflect a stricture or obstruction at the ureteropelvic junction. 2. Soft tissue inflammation about the pelvic kidney and associated vasculature  could reflect pyelonephritis. Would correlate  with the Brian Graham's symptoms. 3. Scattered coronary artery calcifications noted. 4. Diffuse aortic atherosclerosis. Electronically Signed   By: Garald Balding M.D.   On: 04/15/2017 02:39   "

## 2017-04-23 NOTE — Assessment & Plan Note (Signed)
We discussed would need to get him plugged into GI this year for repeat colonoscopy given prior tubular adenoma. With decreased appetite/early satiety- would ask for their opinion on EGD as well. Likely place this referral at 3 week follow up.

## 2017-04-23 NOTE — Assessment & Plan Note (Addendum)
S: WBC elevation in ED up to 13.4 with left shift. Had prior elevations- will add on path smear review. Consider hematology. WBC runs in 10 or greater Noted at least back to 02/28/13 in [facility] records but usually high normal.  Weight was 159 at time of this lab. In 11/2013 he complained of weight loss when weight was down to 156 from 159. He had some leukocytes in urien at that point. Treated for UTI 04/27/2014 with levaquin. By 12/05/14 weight was down to 149. By 06/10/16 weight was down to 143. BPH was noted on 11/20/15 note.  A/P: Suspect his decreased appetite at present is due to infection/illness. 3 week follow up to make sure not losing more weight. Has already had CT chest (lung cancer screening) and CT abd/pelvis for other reasons and no obvious mass. Likely to update colonoscopy at follow up. Already had EGD and colonoscoy 06/2012- adenoma and mild gastritis only. Prior leukocytosis - ? Hematologic issue- but has resolved on check today. Also getting pathology smear review.   Other info fromrecords: July 05, 2012 colonoscopy and EGD with Dr. Raeford Razor for GI issues.  Did have some mild chronic gastritis.did have 1 tubular adenoma. He was tested for H. PYlori and was negative by EGD. Found to have left kidney stone or cyst- ultimately required surgery and stent placement to help remove purulent material and stone. Consideration of nephrectomy had been noted by urology in [place]. Patients appetite finaly returned noted 03/2013 after stent placement January 2014. He had significant weight loss at that time.   At present- He still complains of burping/belching. He also had this issue before in 2013 and -->Trial of probiotics and levsin 0.125mg  SL. Ordered IBD panel, crp, tsh, cbc, bmp, cta bd/pelvis- was having abdominal pain at that time.

## 2017-04-25 ENCOUNTER — Ambulatory Visit: Payer: Medicare Other | Admitting: Family Medicine

## 2017-04-25 LAB — PATHOLOGIST SMEAR REVIEW

## 2017-04-26 ENCOUNTER — Other Ambulatory Visit: Payer: Self-pay | Admitting: Family Medicine

## 2017-04-26 DIAGNOSIS — Z87891 Personal history of nicotine dependence: Secondary | ICD-10-CM

## 2017-04-26 DIAGNOSIS — Z136 Encounter for screening for cardiovascular disorders: Secondary | ICD-10-CM

## 2017-04-26 DIAGNOSIS — I700 Atherosclerosis of aorta: Secondary | ICD-10-CM

## 2017-04-27 ENCOUNTER — Ambulatory Visit (HOSPITAL_COMMUNITY)
Admission: RE | Admit: 2017-04-27 | Discharge: 2017-04-27 | Disposition: A | Discharge status: Home / Self Care | Payer: Medicare Other | Source: Ambulatory Visit | Attending: Cardiology | Admitting: Cardiology

## 2017-04-27 DIAGNOSIS — I708 Atherosclerosis of other arteries: Secondary | ICD-10-CM | POA: Diagnosis not present

## 2017-04-27 DIAGNOSIS — Z136 Encounter for screening for cardiovascular disorders: Secondary | ICD-10-CM | POA: Diagnosis not present

## 2017-04-27 DIAGNOSIS — I700 Atherosclerosis of aorta: Secondary | ICD-10-CM | POA: Diagnosis not present

## 2017-04-27 DIAGNOSIS — Z87891 Personal history of nicotine dependence: Secondary | ICD-10-CM | POA: Insufficient documentation

## 2017-04-29 ENCOUNTER — Other Ambulatory Visit: Payer: Self-pay

## 2017-04-29 DIAGNOSIS — I739 Peripheral vascular disease, unspecified: Secondary | ICD-10-CM

## 2017-05-05 ENCOUNTER — Encounter: Payer: Self-pay | Admitting: Cardiology

## 2017-05-05 NOTE — Progress Notes (Signed)
Cardiology Office Note   Date:  05/06/2017   ID:  Brian Graham, Brian Graham 1951/02/27, MRN [id number]  PCP:  [name], MD  Cardiologist:   Minus Breeding, MD  Referring:  [name], MD   Chief Complaint  Patient presents with  . Coronary Artery Disease      History of Present Illness: Brian Graham is a 66 y.o. male who is referred by [name], MD for evaluation of CAD.  He has moved to this area from [place] and [place].  He is limited by joint pain and some COPD.  He denies any cardiac symptoms.  The patient denies any new symptoms such as chest discomfort, neck or arm discomfort. There has been no new shortness of breath, PND or orthopnea. There have been no reported palpitations, presyncope or syncope.  He can walk a little to golf.  He reports his last stress test was a dobutamine echo about 4 years ago.       Past Medical History:  Diagnosis Date  . Arthritis    "left knee" (08/05/2016)  . CAD (coronary artery disease) of artery bypass graft 08/05/2016   Around age 31. CABG - 3 vessel.   . Colon polyp   . COPD, severe ([place]) 08/23/2016   Alpha 1 studies normal per prior pulmonary group notes Smoked 40 years, quit in 2012 June 2016 PFT from prior pulmonary group: "significant obstruction" FEV1 1.58L (47% pred), Residual volume 171% pred, DLCO 59% pred Simple Spirometry>> 09/15/2016 ratio 42% FEV1 1.02 L / 31%   . Essential tremor 06/11/2016   Plans to see Dr. Carles Collet  . Former smoker 09/01/2016   Quit 2012. 40 pack years at least.   . GERD (gastroesophageal reflux disease)   . History of blood transfusion 08/1997   "when he had his heart surgery"  . History of shingles 1970-2013 X 3  . HTN (hypertension) 08/05/2016   Amlodipine 5mg , atenolol 50mg  BID, benazepril 20mg   . Hyperlipemia   . Hypothyroidism   . Lumbar disc disease   . Myocardial infarction [facility]) 08/1997    Past Surgical History:  Procedure Laterality Date  . BACK SURGERY    .  CARDIAC CATHETERIZATION  08/1997  . CORONARY ARTERY BYPASS GRAFT  09/12/1997   "triple"  . INGUINAL HERNIA REPAIR Right 1961  . KNEE RECONSTRUCTION Left 1960s - 1974 X 4  . LAPAROSCOPIC ABLATION RENAL MASS  ~ 2014   "large abscess/pus ball"  . [place]   "they were wedged in the bowel"  . PATELLA FRACTURE SURGERY Left 1963  . PATELLA FRACTURE SURGERY Left ~ 1965   "removed knee cap"  . TONSILLECTOMY       Current Outpatient Prescriptions  Medication Sig Dispense Refill  . acetaminophen (TYLENOL) 500 MG tablet Take 1,000 mg by mouth every 6 (six) hours as needed for mild pain.    Marland Kitchen albuterol (PROVENTIL) (2.5 MG/3ML) 0.083% nebulizer solution Take 3 mLs (2.5 mg total) by nebulization every 6 (six) hours as needed for wheezing or shortness of breath. 360 mL 11  . amLODipine (NORVASC) 5 MG tablet Take 5 mg by mouth daily.     Marland Kitchen amoxicillin (AMOXIL) 500 MG capsule Take 500 mg by mouth 3 (three) times daily.  0  . aspirin 81 MG EC tablet Take 1 tablet (81 mg total) by mouth daily. 120 tablet 3  . atenolol (TENORMIN) 50 MG tablet Take 50 mg by mouth daily.     Marland Kitchen  atorvastatin (LIPITOR) 80 MG tablet Take 80 mg by mouth daily at 6 PM.     . benazepril (LOTENSIN) 20 MG tablet Take 20 mg by mouth daily.     . budesonide-formoterol (SYMBICORT) 160-4.5 MCG/ACT inhaler Inhale 2 puffs into the lungs 2 (two) times daily.    Marland Kitchen PROAIR HFA 108 (90 Base) MCG/ACT inhaler Inhale 1-2 puffs into the lungs every 4 (four) hours as needed. 18 g 5  . SYNTHROID 125 MCG tablet Take 125 mcg by mouth daily before breakfast.     . Tiotropium Bromide Monohydrate (SPIRIVA RESPIMAT) 2.5 MCG/ACT AERS Inhale 2 puffs into the lungs daily.     No current facility-administered medications for this visit.     Allergies:   Ciprofloxacin and Omeprazole    Social History:  The patient  reports that he has been smoking Cigarettes.  He has a 45.00 pack-year smoking history. He has never used smokeless  tobacco. He reports that he drinks about 8.4 oz of alcohol per week . He reports that he does not use drugs.   Family History:  The patient's family history includes Alcohol abuse in his father and mother; Alcoholism in his sister; Healthy in his sister and sister.    ROS:  Please see the history of present illness.   Otherwise, review of systems are positive for frequent UTIs, 40 lb weight loss over four years with negative work up..   All other systems are reviewed and negative.    PHYSICAL EXAM: VS:  BP 140/76   Pulse 63   Ht 5\' 9"  (1.753 m)   Wt 133 lb (60.3 kg)   BMI 19.64 kg/m  , BMI Body mass index is 19.64 kg/m. GENERAL:  Well appearing HEENT:  Pupils equal round and reactive, fundi not visualized, oral mucosa unremarkable, edentulous.  NECK:  No jugular venous distention, waveform within normal limits, carotid upstroke brisk and symmetric, no bruits, no thyromegaly LYMPHATICS:  No cervical, inguinal adenopathy LUNGS:  Clear to auscultation bilaterally BACK:  No CVA tenderness CHEST:  Well healed sternotomy scar. HEART:  PMI not displaced or sustained,S1 and S2 within normal limits, no S3, no S4, no clicks, no rubs, no murmurs ABD:  Flat, positive bowel sounds normal in frequency in pitch, no bruits, no rebound, no guarding, no midline pulsatile mass, no hepatomegaly, no splenomegaly EXT:  2 plus pulses throughout, no edema, no cyanosis no clubbing SKIN:  No rashes no nodules NEURO:  Cranial nerves II through XII grossly intact, motor grossly intact throughout PSYCH:  Cognitively intact, oriented to person place and time    EKG:  EKG is ordered today. The ekg ordered today demonstrates sinus rhythm, rate 63, axis within normal limits, intervals within normal limits, no acute ST-T wave changes.   Recent Labs: 08/05/2016: B Natriuretic Peptide 48.3 04/15/2017: ALT 17; BUN 12; Creatinine, Ser 0.83; Potassium 3.6; Sodium 140 04/22/2017: Hemoglobin 14.5; Platelets 425.0; TSH  3.42    Lipid Panel No results found for: CHOL, TRIG, HDL, CHOLHDL, VLDL, LDLCALC, LDLDIRECT     Wt Readings from Last 3 Encounters:  05/06/17 133 lb (60.3 kg)  04/22/17 133 lb 9.6 oz (60.6 kg)  04/15/17 135 lb (61.2 kg)      Other studies Reviewed: Additional studies/ records that were reviewed today include: Office records. Review of the above records demonstrates:  Please see elsewhere in the note.     ASSESSMENT AND PLAN:    CAD:  Hisotry of CABG.  The patient has no  new sypmtoms.  No further cardiovascular testing is indicated.  We will continue with aggressive risk reduction and meds as listed.  HTN:  BP is well controlled.  He will continue meds as listed.   DYSLIPIDEMIA:  He will get a lipid profile today.    PVD:  He was found to have >50% stenosis of the right external iliac and left common iliac arteries.  He has been referred to vascular surgery by [name], MD    TOBACCO ABUSE:  He has quit smoking.   Current medicines are reviewed at length with the patient today.  The patient does not have concerns regarding medicines.  The following changes have been made:  no change  Labs/ tests ordered today include:   Orders Placed This Encounter  Procedures  . Lipid panel  . Hepatic function panel  . EKG 12-Lead     Disposition:   FU with me in one year.     Signed, Minus Breeding, MD  05/06/2017 1:44 PM    [facility]

## 2017-05-06 ENCOUNTER — Ambulatory Visit (INDEPENDENT_AMBULATORY_CARE_PROVIDER_SITE_OTHER): Payer: Medicare Other | Admitting: Cardiology

## 2017-05-06 ENCOUNTER — Encounter: Payer: Self-pay | Admitting: Cardiology

## 2017-05-06 VITALS — BP 140/76 | HR 63 | Ht 69.0 in | Wt 133.0 lb

## 2017-05-06 DIAGNOSIS — I10 Essential (primary) hypertension: Secondary | ICD-10-CM

## 2017-05-06 DIAGNOSIS — I2510 Atherosclerotic heart disease of native coronary artery without angina pectoris: Secondary | ICD-10-CM | POA: Diagnosis not present

## 2017-05-06 DIAGNOSIS — E7800 Pure hypercholesterolemia, unspecified: Secondary | ICD-10-CM

## 2017-05-06 DIAGNOSIS — I739 Peripheral vascular disease, unspecified: Secondary | ICD-10-CM

## 2017-05-06 NOTE — Patient Instructions (Signed)
Medication Instructions:  Continue current medications  Labwork: Fasting Lipids Liver  Testing/Procedures: None Ordered  Follow-Up: Your physician wants you to follow-up in: 1 Year. You will receive a reminder letter in the mail two months in advance. If you don't receive a letter, please call our office to schedule the follow-up appointment.   Any Other Special Instructions Will Be Listed Below (If Applicable).   If you need a refill on your cardiac medications before your next appointment, please call your pharmacy.

## 2017-05-07 LAB — HEPATIC FUNCTION PANEL
ALBUMIN: 4.2 g/dL (ref 3.6–4.8)
ALK PHOS: 75 IU/L (ref 39–117)
ALT: 23 IU/L (ref 0–44)
AST: 23 IU/L (ref 0–40)
BILIRUBIN TOTAL: 0.8 mg/dL (ref 0.0–1.2)
Bilirubin, Direct: 0.21 mg/dL (ref 0.00–0.40)
Total Protein: 6.6 g/dL (ref 6.0–8.5)

## 2017-05-07 LAB — LIPID PANEL
CHOLESTEROL TOTAL: 141 mg/dL (ref 100–199)
Chol/HDL Ratio: 2.6 ratio (ref 0.0–5.0)
HDL: 55 mg/dL (ref >39)
LDL Calculated: 68 mg/dL (ref 0–99)
Triglycerides: 88 mg/dL (ref 0–149)
VLDL CHOLESTEROL CAL: 18 mg/dL (ref 5–40)

## 2017-05-09 ENCOUNTER — Other Ambulatory Visit: Payer: Self-pay

## 2017-05-09 DIAGNOSIS — I739 Peripheral vascular disease, unspecified: Secondary | ICD-10-CM

## 2017-05-13 ENCOUNTER — Encounter: Payer: Self-pay | Admitting: Family Medicine

## 2017-05-13 ENCOUNTER — Ambulatory Visit (INDEPENDENT_AMBULATORY_CARE_PROVIDER_SITE_OTHER): Payer: Medicare Other | Admitting: Family Medicine

## 2017-05-13 VITALS — BP 138/76 | HR 65 | Temp 97.8°F | Ht 69.0 in | Wt 133.8 lb

## 2017-05-13 DIAGNOSIS — F17200 Nicotine dependence, unspecified, uncomplicated: Secondary | ICD-10-CM

## 2017-05-13 DIAGNOSIS — Z86010 Personal history of colonic polyps: Secondary | ICD-10-CM

## 2017-05-13 DIAGNOSIS — E034 Atrophy of thyroid (acquired): Secondary | ICD-10-CM

## 2017-05-13 DIAGNOSIS — R634 Abnormal weight loss: Secondary | ICD-10-CM | POA: Diagnosis not present

## 2017-05-13 NOTE — Patient Instructions (Addendum)
We will call you within a week or two about your referral to GI for repeat colonoscopy. If you do not hear within 3 weeks, give [place] a call.   Weight is thankfully stable for several visits now. Lets continue to monitor. Glad you have urology follow up- it really seems that your infections knock your appetite down so hoping start to gain weight now with being done with antibiotics  As always- quit smoking. Great job cutting down though

## 2017-05-13 NOTE — Assessment & Plan Note (Signed)
S: Lab Results  Component Value Date   TSH 3.42 04/22/2017   On thyroid medication-synthroid 125 mcg A/P: reviewed last tsh and that thyroid not likely cause of weight loss

## 2017-05-13 NOTE — Assessment & Plan Note (Signed)
History adenoma 2013. Had been dealing with decreased appetite/early satiety. Appetite has picked up some after UTI resolved and off antibiotics. Refer GI for colonoscopy- consider EGD if losing weight but appears to be gaining as of today (though only slightly)

## 2017-05-13 NOTE — Assessment & Plan Note (Addendum)
S: See last note but long term issues with weight loss related to recurrent UTIs. Most recently enterococcus UTI- after treatment appetite has returned and has stabilized weight/slightly improved weight. Has follow up with Dr. Tresa Moore in August.   He has felt much better since finishing antibiotics. Eating better. Even went out and played golf.   Urology appointment was moved up from august to July 29th which he is thankful for. Prior leukocytosis resolved on labs and pathologist smear review reassuring- thought had slightly high platelets. Still having his burping/belching. Knows he needs updated colon cancer screening.   Denies urinary symptoms other than Monday could not urinate well and slight pain then completely resolved within minutes.  A/P: continue to monitor weight trend. Still suspect this has been infection related as well as antibiotic related as improves off of antibiotics and after treatment. Wants to follow up in 6 weeks to make sure weight stable. Would have seen urology by then.

## 2017-05-13 NOTE — Progress Notes (Signed)
Subjective:  Brian Graham is a 67 y.o. year old very pleasant male patient who presents for/with See problem oriented charting ROS- no fever, chills, nausea, vomiting. Increased appetite from lats visit   Past Medical History-  Patient Active Problem List   Diagnosis Date Noted  . Weight loss 04/23/2017    Priority: High  . Smoker 09/01/2016    Priority: High  . COPD, severe ([place]) 08/23/2016    Priority: High  . Hx of CABG 08/05/2016    Priority: High  . CAD (coronary artery disease) of artery bypass graft 08/05/2016    Priority: High  . History of adenomatous polyp of colon 04/22/2017    Priority: Medium  . Hyperlipidemia 04/04/2017    Priority: Medium  . Hypothyroidism 04/04/2017    Priority: Medium  . HTN (hypertension) 08/05/2016    Priority: Medium  . Essential tremor 06/11/2016    Priority: Medium  . Seasonal allergies 04/04/2017    Priority: Low  . Myalgia 10/04/2016    Priority: Low  . Dysuria 08/23/2016    Priority: Low  . Chronic pain of both knees 08/23/2016    Priority: Low  . Tension headache 06/11/2016    Priority: Low  . Pyelonephritis 04/23/2017  . Aortic atherosclerosis ([place]) 04/13/2017    Medications- reviewed and updated Current Outpatient Prescriptions  Medication Sig Dispense Refill  . acetaminophen (TYLENOL) 500 MG tablet Take 1,000 mg by mouth every 6 (six) hours as needed for mild pain.    Marland Kitchen albuterol (PROVENTIL) (2.5 MG/3ML) 0.083% nebulizer solution Take 3 mLs (2.5 mg total) by nebulization every 6 (six) hours as needed for wheezing or shortness of breath. 360 mL 11  . amLODipine (NORVASC) 5 MG tablet Take 5 mg by mouth daily.     Marland Kitchen aspirin 81 MG EC tablet Take 1 tablet (81 mg total) by mouth daily. 120 tablet 3  . atenolol (TENORMIN) 50 MG tablet Take 50 mg by mouth daily.     Marland Kitchen atorvastatin (LIPITOR) 80 MG tablet Take 80 mg by mouth daily at 6 PM.     . benazepril (LOTENSIN) 20 MG tablet Take 20 mg by mouth daily.     .  budesonide-formoterol (SYMBICORT) 160-4.5 MCG/ACT inhaler Inhale 2 puffs into the lungs 2 (two) times daily.    Marland Kitchen PROAIR HFA 108 (90 Base) MCG/ACT inhaler Inhale 1-2 puffs into the lungs every 4 (four) hours as needed. 18 g 5  . SYNTHROID 125 MCG tablet Take 125 mcg by mouth daily before breakfast.     . Tiotropium Bromide Monohydrate (SPIRIVA RESPIMAT) 2.5 MCG/ACT AERS Inhale 2 puffs into the lungs daily.     No current facility-administered medications for this visit.     Objective: BP 138/76 (BP Location: Left Arm, Patient Position: Sitting, Cuff Size: Large)   Pulse 65   Temp 97.8 F (36.6 C) (Oral)   Ht 5\' 9"  (1.753 m)   Wt 133 lb 12.8 oz (60.7 kg)   SpO2 93%   BMI 19.76 kg/m  Gen: NAD, resting comfortably CV: RRR no murmurs rubs or gallops Lungs: CTAB no crackles, wheeze, rhonchi Abdomen: soft/nontender/nondistended/normal bowel sounds. No rebound or guarding.  Ext: no edema Skin: warm, dry Neuro: grossly normal, moves all extremities  Assessment/Plan:  Smoker Down to 6 cigarettes a day- advised complete cessation- could help him with weight gain as well as being great for overall health  History of adenomatous polyp of colon History adenoma 2013. Had been dealing with decreased appetite/early satiety.  Appetite has picked up some after UTI resolved and off antibiotics. Refer GI for colonoscopy- consider EGD if losing weight but appears to be gaining as of today (though only slightly)  Hypothyroidism S: Lab Results  Component Value Date   TSH 3.42 04/22/2017   On thyroid medication-synthroid 125 mcg A/P: reviewed last tsh and that thyroid not likely cause of weight loss   Weight loss S: See last note but long term issues with weight loss related to recurrent UTIs. Most recently enterococcus UTI- after treatment appetite has returned and has stabilized weight/slightly improved weight. Has follow up with Dr. Tresa Moore in August.   He has felt much better since finishing  antibiotics. Eating better. Even went out and played golf.   Urology appointment was moved up from august to July 29th which he is thankful for. Prior leukocytosis resolved on labs and pathologist smear review reassuring- thought had slightly high platelets. Still having his burping/belching. Knows he needs updated colon cancer screening.   Denies urinary symptoms other than Monday could not urinate well and slight pain then completely resolved within minutes.  A/P: continue to monitor weight trend. Still suspect this has been infection related as well as antibiotic related as improves off of antibiotics and after treatment. Wants to follow up in 6 weeks to make sure weight stable. Would have seen urology by then.    Return in about 6 weeks (around 06/24/2017).  Orders Placed This Encounter  Procedures  . Ambulatory referral to Gastroenterology    Referral Priority:   Routine    Referral Type:   Consultation    Referral Reason:   Specialty Services Required    Number of Visits Requested:   1   Shingles x3- discussed shingrix at pharmacy option- he agrees to consider  Return precautions advised.  Garret Reddish, MD

## 2017-05-13 NOTE — Assessment & Plan Note (Signed)
Down to 6 cigarettes a day- advised complete cessation- could help him with weight gain as well as being great for overall health

## 2017-05-24 ENCOUNTER — Telehealth: Payer: Self-pay | Admitting: Family Medicine

## 2017-05-24 ENCOUNTER — Other Ambulatory Visit: Payer: Self-pay

## 2017-05-24 MED ORDER — AMLODIPINE BESYLATE 5 MG PO TABS: 5.0000 mg | ORAL_TABLET | Freq: Every day | ORAL | 3 refills | Status: DC

## 2017-05-24 NOTE — Telephone Encounter (Signed)
Pt needs new rx amlodipine 5 mg #90 w/[facility]

## 2017-05-24 NOTE — Telephone Encounter (Signed)
Prescription sent to pharmacy as requested.

## 2017-06-13 ENCOUNTER — Encounter: Payer: Self-pay | Admitting: Family Medicine

## 2017-06-28 ENCOUNTER — Encounter: Payer: Self-pay | Admitting: Vascular Surgery

## 2017-06-29 ENCOUNTER — Telehealth: Payer: Self-pay | Admitting: Pulmonary Disease

## 2017-06-29 MED ORDER — TIOTROPIUM BROMIDE MONOHYDRATE 2.5 MCG/ACT IN AERS: 2.0000 | INHALATION_SPRAY | Freq: Every day | RESPIRATORY_TRACT | 3 refills | Status: DC

## 2017-06-29 MED ORDER — PROAIR HFA 108 (90 BASE) MCG/ACT IN AERS: 1.0000 | INHALATION_SPRAY | RESPIRATORY_TRACT | 1 refills | Status: DC | PRN

## 2017-06-29 NOTE — Telephone Encounter (Signed)
Pt requesting refills on Proair and Sprivia 2.5. Rx has been sent to preferred pharmacy. Pt is aware and voiced his understanding. Nothing further needed.

## 2017-07-01 ENCOUNTER — Ambulatory Visit (INDEPENDENT_AMBULATORY_CARE_PROVIDER_SITE_OTHER): Payer: Medicare Other | Admitting: Family Medicine

## 2017-07-01 ENCOUNTER — Encounter: Payer: Self-pay | Admitting: Family Medicine

## 2017-07-01 VITALS — BP 118/64 | HR 60 | Temp 98.3°F | Ht 69.0 in | Wt 137.4 lb

## 2017-07-01 DIAGNOSIS — J441 Chronic obstructive pulmonary disease with (acute) exacerbation: Secondary | ICD-10-CM

## 2017-07-01 DIAGNOSIS — H66001 Acute suppurative otitis media without spontaneous rupture of ear drum, right ear: Secondary | ICD-10-CM

## 2017-07-01 MED ORDER — AMOXICILLIN-POT CLAVULANATE 875-125 MG PO TABS: 1.0000 | ORAL_TABLET | Freq: Two times a day (BID) | ORAL | 0 refills | Status: AC

## 2017-07-01 MED ORDER — PREDNISONE 20 MG PO TABS: ORAL_TABLET | ORAL | 0 refills | Status: DC

## 2017-07-01 NOTE — Progress Notes (Signed)
Subjective:  Brian Graham is a 66 y.o. year old very pleasant male patient who presents for/with See problem oriented charting ROS- no exertional pain, does have some wheeze and shortness of breath, feels tightness in top of chest and congested. No edema   Past Medical History-  Patient Active Problem List   Diagnosis Date Noted  . Weight loss 04/23/2017    Priority: High  . Smoker 09/01/2016    Priority: High  . COPD, severe ([place]) 08/23/2016    Priority: High  . Hx of CABG 08/05/2016    Priority: High  . CAD (coronary artery disease) of artery bypass graft 08/05/2016    Priority: High  . History of adenomatous polyp of colon 04/22/2017    Priority: Medium  . Hyperlipidemia 04/04/2017    Priority: Medium  . Hypothyroidism 04/04/2017    Priority: Medium  . HTN (hypertension) 08/05/2016    Priority: Medium  . Essential tremor 06/11/2016    Priority: Medium  . Seasonal allergies 04/04/2017    Priority: Low  . Myalgia 10/04/2016    Priority: Low  . Dysuria 08/23/2016    Priority: Low  . Chronic pain of both knees 08/23/2016    Priority: Low  . Tension headache 06/11/2016    Priority: Low  . Pyelonephritis 04/23/2017  . Aortic atherosclerosis ([place]) 04/13/2017    Medications- reviewed and updated Current Outpatient Prescriptions  Medication Sig Dispense Refill  . acetaminophen (TYLENOL) 500 MG tablet Take 1,000 mg by mouth every 6 (six) hours as needed for mild pain.    Marland Kitchen albuterol (PROVENTIL) (2.5 MG/3ML) 0.083% nebulizer solution Take 3 mLs (2.5 mg total) by nebulization every 6 (six) hours as needed for wheezing or shortness of breath. 360 mL 11  . amLODipine (NORVASC) 5 MG tablet Take 1 tablet (5 mg total) by mouth daily. 90 tablet 3  . aspirin 81 MG EC tablet Take 1 tablet (81 mg total) by mouth daily. 120 tablet 3  . atenolol (TENORMIN) 50 MG tablet Take 50 mg by mouth daily.     Marland Kitchen atorvastatin (LIPITOR) 80 MG tablet Take 80 mg by mouth daily at 6 PM.     .  benazepril (LOTENSIN) 20 MG tablet Take 20 mg by mouth daily.     . budesonide-formoterol (SYMBICORT) 160-4.5 MCG/ACT inhaler Inhale 2 puffs into the lungs 2 (two) times daily.    Marland Kitchen PROAIR HFA 108 (90 Base) MCG/ACT inhaler Inhale 1-2 puffs into the lungs every 4 (four) hours as needed. 3 Inhaler 1  . SYNTHROID 125 MCG tablet Take 125 mcg by mouth daily before breakfast.     . Tiotropium Bromide Monohydrate (SPIRIVA RESPIMAT) 2.5 MCG/ACT AERS Inhale 2 puffs into the lungs daily. 3 Inhaler 3   No current facility-administered medications for this visit.     Objective: BP 118/64 (BP Location: Left Arm, Patient Position: Sitting, Cuff Size: Large)   Pulse 60   Temp 98.3 F (36.8 C) (Oral)   Ht 5\' 9"  (1.753 m)   Wt 137 lb 6.4 oz (62.3 kg)   SpO2 95%   BMI 20.29 kg/m  Gen: NAD, appears somewhat fatigued TM on the right is erythematous and appears to have some fluid behind it, left TM normal, right pharynx with some yellow drainage noted CV: RRR no murmurs rubs or gallops Lungs: occasional wheeze, reasonable air flow.  Abdomen: soft/nontender/nondistended/normal bowel sounds. No rebound or guarding.  Ext: no edema Skin: warm, dry  Assessment/Plan:  Acute suppurative otitis media of right  ear without spontaneous rupture of tympanic membrane, recurrence not specified  COPD exacerbation ([place]) S: Woke up this morning with right ear pain at least moderate aching and feels full. Then right throat started to hurt. Finally his upper chest started to feel very congested and somewhat tight at the top. States he has similar before that responded well to prednisone and helped "break things up" in the upper chest. He is wheezing some, only coughing slightly more. His upper chest feeling of congestion/tightness does not worsen with exertion- and he states his issues with his heart have never felt like this. Minimal nasal discharge, some fatigue.  A/P: Right otitis media- given drainage in throat and  tightness in upper chest will also cover for sinusitis/potentially COPD exacerbation. Also treat with prednisone- he was given STRICT return precautions and will seek care immediately for new or worsening symptoms despite treatment.  Patient Instructions  R ear appears to have infection  Some drainage in right side of throat   Will treat with augmentin for ear and throat- may also help the lungs   Sounds like possible flare up of your COPD. Treat with prednisone since this has helped in past. Plain mucinex can help loosen things up as well.   We have Saturday clinic tomorrow or urgent care if not improving but hopeful within 24 hours will feel better- if getting worse seek care   Meds ordered this encounter  Medications  . amoxicillin-clavulanate (AUGMENTIN) 875-125 MG tablet    Sig: Take 1 tablet by mouth 2 (two) times daily.    Dispense:  14 tablet    Refill:  0  . predniSONE (DELTASONE) 20 MG tablet    Sig: Take 2 pills for 5 days    Dispense:  10 tablet    Refill:  0    Return precautions advised- both in office and emergent Garret Reddish, MD

## 2017-07-01 NOTE — Patient Instructions (Signed)
R ear appears to have infection  Some drainage in right side of throat   Will treat with augmentin for ear and throat- may also help the lungs   Sounds like possible flare up of your COPD. Treat with prednisone since this has helped in past. Plain mucinex can help loosen things up as well.   We have Saturday clinic tomorrow or urgent care if not improving but hopeful within 24 hours will feel better- if getting worse seek care

## 2017-07-06 ENCOUNTER — Ambulatory Visit (INDEPENDENT_AMBULATORY_CARE_PROVIDER_SITE_OTHER): Payer: Medicare Other | Admitting: Vascular Surgery

## 2017-07-06 ENCOUNTER — Ambulatory Visit (HOSPITAL_COMMUNITY)
Admission: RE | Admit: 2017-07-06 | Discharge: 2017-07-06 | Disposition: A | Discharge status: Home / Self Care | Payer: Medicare Other | Source: Ambulatory Visit | Attending: Vascular Surgery | Admitting: Vascular Surgery

## 2017-07-06 ENCOUNTER — Telehealth: Payer: Self-pay | Admitting: Pulmonary Disease

## 2017-07-06 ENCOUNTER — Encounter: Payer: Self-pay | Admitting: Vascular Surgery

## 2017-07-06 DIAGNOSIS — I739 Peripheral vascular disease, unspecified: Secondary | ICD-10-CM

## 2017-07-06 DIAGNOSIS — Z720 Tobacco use: Secondary | ICD-10-CM | POA: Insufficient documentation

## 2017-07-06 DIAGNOSIS — R938 Abnormal findings on diagnostic imaging of other specified body structures: Secondary | ICD-10-CM | POA: Insufficient documentation

## 2017-07-06 DIAGNOSIS — I70219 Atherosclerosis of native arteries of extremities with intermittent claudication, unspecified extremity: Secondary | ICD-10-CM | POA: Insufficient documentation

## 2017-07-06 DIAGNOSIS — I70212 Atherosclerosis of native arteries of extremities with intermittent claudication, left leg: Secondary | ICD-10-CM

## 2017-07-06 DIAGNOSIS — E785 Hyperlipidemia, unspecified: Secondary | ICD-10-CM | POA: Insufficient documentation

## 2017-07-06 DIAGNOSIS — R0989 Other specified symptoms and signs involving the circulatory and respiratory systems: Secondary | ICD-10-CM | POA: Diagnosis present

## 2017-07-06 NOTE — Telephone Encounter (Signed)
Spoke with pharmacist. She wanted to verify that the patient was on Spiriva. Nothing else was needed at time of call.

## 2017-07-06 NOTE — Progress Notes (Signed)
Requested by:  [name], [address] [place], [place] [address]  Reason for consultation: possible left iliac occlusion   History of Present Illness   Brian Graham is a 66 y.o. ([birth year]) male who presents with chief complaint: left hip pain and left pelvic kidney infection.  This patient noted onset of left hip pain >4 years ago.  Pain is described as aching, severity 3-6/10, and associated with short distance ambulation.  Patient has attempted to treat this pain with rest.  The patient has no rest pain symptoms also and no leg wounds/ulcers.  Reportedly patient is in the process of evaluation for left nephrectomy.  On his recent welcome to medicare screen for AAA, he was found on that study to have possible occluded left external iliac artery.  The patient denies any back or abdominal pain  Atherosclerotic risk factors include: HLD, HTN, active smoking.  Past Medical History:  Diagnosis Date  . Arthritis    "left knee" (08/05/2016)  . CAD (coronary artery disease) of artery bypass graft 08/05/2016   Around age 60. CABG - 3 vessel.   . Colon polyp   . COPD, severe ([place]) 08/23/2016   Alpha 1 studies normal per prior pulmonary group notes Smoked 40 years, quit in 2012 June 2016 PFT from prior pulmonary group: "significant obstruction" FEV1 1.58L (47% pred), Residual volume 171% pred, DLCO 59% pred Simple Spirometry>> 09/15/2016 ratio 42% FEV1 1.02 L / 31%   . Essential tremor 06/11/2016   Plans to see Dr. Carles Collet  . Former smoker 09/01/2016   Quit 2012. 40 pack years at least.   . GERD (gastroesophageal reflux disease)   . History of blood transfusion 08/1997   "when he had his heart surgery"  . History of shingles 1970-2013 X 3  . HTN (hypertension) 08/05/2016   Amlodipine 5mg , atenolol 50mg  BID, benazepril 20mg   . Hyperlipemia   . Hypothyroidism   . Lumbar disc disease   . Myocardial infarction [facility]) 08/1997    Past Surgical History:  Procedure  Laterality Date  . BACK SURGERY    . CARDIAC CATHETERIZATION  08/1997  . CORONARY ARTERY BYPASS GRAFT  09/12/1997   "triple"  . INGUINAL HERNIA REPAIR Right 1961  . KNEE RECONSTRUCTION Left 1960s - 1974 X 4  . LAPAROSCOPIC ABLATION RENAL MASS  ~ 2014   "large abscess/pus ball"  . [place]   "they were wedged in the bowel"  . PATELLA FRACTURE SURGERY Left 1963  . PATELLA FRACTURE SURGERY Left ~ 1965   "removed knee cap"  . TONSILLECTOMY      Social History   Social History  . Marital status: Married    Spouse name: N/A  . Number of children: N/A  . Years of education: N/A   Occupational History  . Not on file.   Social History Main Topics  . Smoking status: Current Every Day Smoker    Packs/day: 1.00    Years: 45.00    Types: Cigarettes    Last attempt to quit: 08/03/2011  . Smokeless tobacco: Never Used     Comment: Now is smoking 10 cigarettes daily  . Alcohol use 8.4 oz/week    14 Shots of liquor per week     Comment: 08/05/2016 "2 shots of vodka/night"  . Drug use: No  . Sexual activity: Not Currently   Other Topics Concern  . Not on file   Social History Narrative   Lives with wife.  2 adopted daughters from [place].       Retired from [employer] History  Problem Relation Age of Onset  . Alcohol abuse Mother   . Alcohol abuse Father        not involved in life  . Alcoholism Sister   . Healthy Sister   . Healthy Sister     Current Outpatient Prescriptions  Medication Sig Dispense Refill  . acetaminophen (TYLENOL) 500 MG tablet Take 1,000 mg by mouth every 6 (six) hours as needed for mild pain.    Marland Kitchen albuterol (PROVENTIL) (2.5 MG/3ML) 0.083% nebulizer solution Take 3 mLs (2.5 mg total) by nebulization every 6 (six) hours as needed for wheezing or shortness of breath. 360 mL 11  . amLODipine (NORVASC) 5 MG tablet Take 1 tablet (5 mg total) by mouth daily. 90 tablet 3  . amoxicillin-clavulanate (AUGMENTIN) 875-125 MG tablet Take 1  tablet by mouth 2 (two) times daily. 14 tablet 0  . aspirin 81 MG EC tablet Take 1 tablet (81 mg total) by mouth daily. 120 tablet 3  . atenolol (TENORMIN) 50 MG tablet Take 50 mg by mouth daily.     Marland Kitchen atorvastatin (LIPITOR) 80 MG tablet Take 80 mg by mouth daily at 6 PM.     . benazepril (LOTENSIN) 20 MG tablet Take 20 mg by mouth daily.     . budesonide-formoterol (SYMBICORT) 160-4.5 MCG/ACT inhaler Inhale 2 puffs into the lungs 2 (two) times daily.    . predniSONE (DELTASONE) 20 MG tablet Take 2 pills for 5 days 10 tablet 0  . PROAIR HFA 108 (90 Base) MCG/ACT inhaler Inhale 1-2 puffs into the lungs every 4 (four) hours as needed. 3 Inhaler 1  . SYNTHROID 125 MCG tablet Take 125 mcg by mouth daily before breakfast.     . Tiotropium Bromide Monohydrate (SPIRIVA RESPIMAT) 2.5 MCG/ACT AERS Inhale 2 puffs into the lungs daily. 3 Inhaler 3   No current facility-administered medications for this visit.     Allergies  Allergen Reactions  . Ciprofloxacin Diarrhea  . Omeprazole Diarrhea    REVIEW OF SYSTEMS (negative unless checked):   Cardiac:  []  Chest pain or chest pressure? []  Shortness of breath upon activity? []  Shortness of breath when lying flat? []  Irregular heart rhythm?  Vascular:  [x]  Pain in calf, thigh, or hip brought on by walking? [x]  Pain in feet at night that wakes you up from your sleep? []  Blood clot in your veins? []  Leg swelling?  Pulmonary:  []  Oxygen at home? []  Productive cough? []  Wheezing?  Neurologic:  []  Sudden weakness in arms or legs? []  Sudden numbness in arms or legs? []  Sudden onset of difficult speaking or slurred speech? []  Temporary loss of vision in one eye? []  Problems with dizziness?  Gastrointestinal:  []  Blood in stool? []  Vomited blood?  Genitourinary:  [x]  Burning when urinating? []  Blood in urine?  Psychiatric:  []  Major depression  Hematologic:  [x]  Bleeding problems? []  Problems with blood clotting?  Dermatologic:    []  Rashes or ulcers?  Constitutional:  []  Fever or chills?  Ear/Nose/Throat:  []  Change in hearing? []  Nose bleeds? []  Sore throat?  Musculoskeletal:  []  Back pain? []  Joint pain? []  Muscle pain?   For VQI Use Only   PRE-ADM LIVING Home  AMB STATUS Ambulatory  CAD Sx None  PRIOR CHF None  STRESS TEST No   Physical Examination     Vitals:   07/06/17 1512  BP:  122/76  Pulse: 70  Resp: 16  Temp: 97.8 F (36.6 C)  TempSrc: Oral  SpO2: 94%  Weight: 134 lb 3 oz (60.9 kg)  Height: 5' 7.25" (1.708 m)   Body mass index is 20.86 kg/m.  General Alert, O x 3, WD, NAD  Head [place]/AT,    Ear/Nose/ Throat Hearing grossly intact, nares without erythema or drainage, oropharynx without Erythema or Exudate, Mallampati score: 3,   Eyes PERRLA, EOMI,    Neck Supple, mid-line trachea,    Pulmonary Sym exp, good B air movt, CTA B  Cardiac RRR, Nl S1, S2, no Murmurs, No rubs, No S3,S4  Vascular Vessel Right Left  Radial Palpable Palpable  Brachial Palpable Palpable  Carotid Palpable, No Bruit Palpable, No Bruit  Aorta Not palpable N/A  Femoral Palpable Palpable  Popliteal Not palpable Not palpable  PT Palpable Faintly palpable  DP Palpable Not palpable    Gastro- intestinal soft, non-distended, non-tender to palpation, No guarding or rebound, no HSM, no masses, no CVAT B, No palpable prominent aortic pulse, Surgical incisions well healed, palpable diastasis inferior to xiphoid  Musculo- skeletal M/S 5/5 throughout  , Extremities without ischemic changes  , No edema present, No visible varicosities , No Lipodermatosclerosis present  Neurologic Cranial nerves 2-12 intact , Pain and light touch intact in extremities , Motor exam as listed above  Psychiatric Judgement intact, Mood & affect appropriate for pt's clinical situation  Dermatologic See M/S exam for extremity exam, No rashes otherwise noted, small areas of echymosis  Lymphatic  Palpable lymph nodes: None     Non-Invasive Vascular imaging   BLE ABI (07/06/2017)  R:   ABI: 1.01,   PT: tri  DP: tri  TBI:  0.73  L:   ABI: 0.68,   PT: bi  DP: mono  TBI: 0.50   Radiology     CT Abd/pelvis w/o contrast (04/15/17) 1. Left-sided pelvic kidney, with apparent chronic severe left-sided hydronephrosis, worsened from January. This appears to reflect a stricture or obstruction at the ureteropelvic junction. 2. Soft tissue inflammation about the pelvic kidney and associated vasculature could reflect pyelonephritis. Would correlate with the patient's symptoms. 3. Scattered coronary artery calcifications noted. 4. Diffuse aortic atherosclerosis.  I reviewed this patient's CT without contrast and diffuse calcific atherosclerosis in aortoiliac segments is noted.  There is a good chance this patient has L iliac artery high grade stenosis vs occlusion based on the pattern of calcification.   Outside Studies/Documentation   10 pages of outside documents were reviewed including: outpatient nephrology chart.    Medical Decision Making   Brian Graham is a 66 y.o. male who presents with: possible L iliac artery stenosis vs occlusion leading to L  intermittent claudication, recurrent pyelonephritis involving left pelvic kidney   Based on this patient's history and physical exam, I recommend: aortogram, bilateral pelvic angiogram, possible left iliac artery intervention. I discussed with the patient the nature of angiographic procedures, especially the limited patencies of any endovascular intervention.   The patient is aware of that the risks of an angiographic procedure include but are not limited to: bleeding, infection, access site complications, renal failure, embolization, rupture of vessel, dissection, arteriovenous fistula, possible need for emergent surgical intervention, possible need for surgical procedures to treat the patient's pathology, anaphylactic reaction to  contrast, and stroke and death.   The patient is aware of the risks and agrees to proceed.  He is schedule in the [facility] on 23 AUG 18.  Improving the perfusion to the left pelvis should help him heal any nephrectomy.  The issue is the heavy calcification might make recannulation difficult if not impossible.  I discussed with the patient the natural history of intermittent claudication: 75% of patients have stable or improved symptoms in a year an only 2% require amputation. Eventually 20% may require intervention in a year.  I discussed in depth with the patient the nature of atherosclerosis, and emphasized the importance of maximal medical management including strict control of blood pressure, blood glucose, and lipid levels, antiplatelet agent, obtaining regular exercise, and cessation of smoking.    The patient is aware that without maximal medical management the underlying atherosclerotic disease process will progress, limiting the benefit of any interventions.  I discussed in depth with the patient a walking plan and how to execute such. The patient is currently on a statin: Lipitor.  The patient is currently on an anti-platelet: ASA.  Thank you for allowing [place] to participate in this patient's care.   Adele Barthel, MD, FACS [facility] Office: [phone] Pager: [phone]  07/06/2017, 4:14 PM

## 2017-07-10 ENCOUNTER — Inpatient Hospital Stay (HOSPITAL_COMMUNITY)
Admission: EM | Admit: 2017-07-10 | Discharge: 2017-07-13 | DRG: 190 | Disposition: A | Discharge status: Home / Self Care | Payer: Medicare Other | Attending: Internal Medicine | Admitting: Internal Medicine

## 2017-07-10 ENCOUNTER — Emergency Department (HOSPITAL_COMMUNITY): Payer: Medicare Other

## 2017-07-10 ENCOUNTER — Encounter (HOSPITAL_COMMUNITY): Payer: Self-pay | Admitting: Emergency Medicine

## 2017-07-10 DIAGNOSIS — J9602 Acute respiratory failure with hypercapnia: Secondary | ICD-10-CM | POA: Diagnosis present

## 2017-07-10 DIAGNOSIS — E039 Hypothyroidism, unspecified: Secondary | ICD-10-CM | POA: Diagnosis present

## 2017-07-10 DIAGNOSIS — G250 Essential tremor: Secondary | ICD-10-CM | POA: Diagnosis present

## 2017-07-10 DIAGNOSIS — Z7951 Long term (current) use of inhaled steroids: Secondary | ICD-10-CM

## 2017-07-10 DIAGNOSIS — I25810 Atherosclerosis of coronary artery bypass graft(s) without angina pectoris: Secondary | ICD-10-CM | POA: Diagnosis present

## 2017-07-10 DIAGNOSIS — Z881 Allergy status to other antibiotic agents status: Secondary | ICD-10-CM

## 2017-07-10 DIAGNOSIS — Z79899 Other long term (current) drug therapy: Secondary | ICD-10-CM

## 2017-07-10 DIAGNOSIS — Z888 Allergy status to other drugs, medicaments and biological substances status: Secondary | ICD-10-CM

## 2017-07-10 DIAGNOSIS — I2510 Atherosclerotic heart disease of native coronary artery without angina pectoris: Secondary | ICD-10-CM | POA: Diagnosis present

## 2017-07-10 DIAGNOSIS — E872 Acidosis: Secondary | ICD-10-CM | POA: Diagnosis present

## 2017-07-10 DIAGNOSIS — Z951 Presence of aortocoronary bypass graft: Secondary | ICD-10-CM

## 2017-07-10 DIAGNOSIS — J9621 Acute and chronic respiratory failure with hypoxia: Secondary | ICD-10-CM

## 2017-07-10 DIAGNOSIS — I10 Essential (primary) hypertension: Secondary | ICD-10-CM | POA: Diagnosis present

## 2017-07-10 DIAGNOSIS — J9601 Acute respiratory failure with hypoxia: Secondary | ICD-10-CM

## 2017-07-10 DIAGNOSIS — I252 Old myocardial infarction: Secondary | ICD-10-CM

## 2017-07-10 DIAGNOSIS — M1712 Unilateral primary osteoarthritis, left knee: Secondary | ICD-10-CM | POA: Diagnosis present

## 2017-07-10 DIAGNOSIS — J441 Chronic obstructive pulmonary disease with (acute) exacerbation: Principal | ICD-10-CM | POA: Diagnosis present

## 2017-07-10 DIAGNOSIS — Z7982 Long term (current) use of aspirin: Secondary | ICD-10-CM

## 2017-07-10 DIAGNOSIS — J9622 Acute and chronic respiratory failure with hypercapnia: Secondary | ICD-10-CM | POA: Diagnosis present

## 2017-07-10 LAB — I-STAT ARTERIAL BLOOD GAS, ED
Acid-Base Excess: 1 mmol/L (ref 0.0–2.0)
BICARBONATE: 29 mmol/L — AB (ref 20.0–28.0)
O2 Saturation: 100 %
PCO2 ART: 58.4 mmHg — AB (ref 32.0–48.0)
Patient temperature: 98.6
TCO2: 31 mmol/L (ref 0–100)
pH, Arterial: 7.304 — ABNORMAL LOW (ref 7.350–7.450)
pO2, Arterial: 205 mmHg — ABNORMAL HIGH (ref 83.0–108.0)

## 2017-07-10 LAB — I-STAT CG4 LACTIC ACID, ED: Lactic Acid, Venous: 3.52 mmol/L (ref 0.5–1.9)

## 2017-07-10 MED ORDER — MAGNESIUM SULFATE 2 GM/50ML IV SOLN
2.0000 g | Freq: Once | INTRAVENOUS | Status: AC
  Administered 2017-07-10: 2 g via INTRAVENOUS
  Filled 2017-07-10: qty 50

## 2017-07-10 MED ORDER — SODIUM CHLORIDE 0.9 % IV BOLUS (SEPSIS)
500.0000 mL | Freq: Once | INTRAVENOUS | Status: AC
  Administered 2017-07-10: 500 mL via INTRAVENOUS

## 2017-07-10 MED ORDER — IPRATROPIUM BROMIDE 0.02 % IN SOLN
0.5000 mg | Freq: Once | RESPIRATORY_TRACT | Status: AC
  Administered 2017-07-10: 0.5 mg via RESPIRATORY_TRACT
  Filled 2017-07-10: qty 2.5

## 2017-07-10 MED ORDER — ALBUTEROL (5 MG/ML) CONTINUOUS INHALATION SOLN
15.0000 mg/h | INHALATION_SOLUTION | Freq: Once | RESPIRATORY_TRACT | Status: AC
  Administered 2017-07-10: 15 mg/h via RESPIRATORY_TRACT
  Filled 2017-07-10: qty 20

## 2017-07-10 NOTE — ED Triage Notes (Signed)
Per EMS pt from home history of COPD.  Productive cough and dyspnea all day with no relief with home neb tx.  88%  O2 sat on EMS arrival.  RR 40  Diminished throughout.  1 atrovent 10 albuterol, 125 solu medrol, 2 grams of mag given en route.

## 2017-07-10 NOTE — ED Provider Notes (Signed)
[facility] DEPT Provider Note   CSN: [id number] Arrival date & time: 07/10/17  2307     History   Chief Complaint Chief Complaint  Patient presents with  . Respiratory Distress    HPI Brian Graham is a 66 y.o. male.  Level 5 caveat for respiratory distress. Patient presents with respiratory distress that onset this evening of his getting ready for bed. He is tripoding on nebulizer arrival satting 85%. Tachypnea with accessory muscle use. Denies chest pain. EMS gave Atrovent, albuterol, solu- Medrol magnesium. Patient is not on oxygen at home. Placed on BiPAP immediately on arrival. States he's had a cough productive of green sputum over the past several days and been treated for an ear infection. Denies fever. Denies chest pain. Denies Leg pain or swelling. History of CAD status post bypass remotely.   The history is provided by the patient and the EMS personnel. The history is limited by the condition of the patient.    Past Medical History:  Diagnosis Date  . Arthritis    "left knee" (08/05/2016)  . CAD (coronary artery disease) of artery bypass graft 08/05/2016   Around age 18. CABG - 3 vessel.   . Colon polyp   . COPD, severe ([place]) 08/23/2016   Alpha 1 studies normal per prior pulmonary group notes Smoked 40 years, quit in 2012 June 2016 PFT from prior pulmonary group: "significant obstruction" FEV1 1.58L (47% pred), Residual volume 171% pred, DLCO 59% pred Simple Spirometry>> 09/15/2016 ratio 42% FEV1 1.02 L / 31%   . Essential tremor 06/11/2016   Plans to see Dr. Carles Collet  . Former smoker 09/01/2016   Quit 2012. 40 pack years at least.   . GERD (gastroesophageal reflux disease)   . History of blood transfusion 08/1997   "when he had his heart surgery"  . History of shingles 1970-2013 X 3  . HTN (hypertension) 08/05/2016   Amlodipine 5mg , atenolol 50mg  BID, benazepril 20mg   . Hyperlipemia   . Hypothyroidism   . Lumbar disc disease   . Myocardial infarction [facility])  08/1997    Patient Active Problem List   Diagnosis Date Noted  . Atherosclerosis of native arteries of extremity with intermittent claudication ([place]) 07/06/2017  . Pyelonephritis 04/23/2017  . Weight loss 04/23/2017  . History of adenomatous polyp of colon 04/22/2017  . Aortic atherosclerosis ([place]) 04/13/2017  . Hyperlipidemia 04/04/2017  . Hypothyroidism 04/04/2017  . Seasonal allergies 04/04/2017  . Myalgia 10/04/2016  . Smoker 09/01/2016  . COPD, severe ([place]) 08/23/2016  . Dysuria 08/23/2016  . Chronic pain of both knees 08/23/2016  . Hx of CABG 08/05/2016  . CAD (coronary artery disease) of artery bypass graft 08/05/2016  . HTN (hypertension) 08/05/2016  . Essential tremor 06/11/2016  . Tension headache 06/11/2016    Past Surgical History:  Procedure Laterality Date  . BACK SURGERY    . CARDIAC CATHETERIZATION  08/1997  . CORONARY ARTERY BYPASS GRAFT  09/12/1997   "triple"  . INGUINAL HERNIA REPAIR Right 1961  . KNEE RECONSTRUCTION Left 1960s - 1974 X 4  . LAPAROSCOPIC ABLATION RENAL MASS  ~ 2014   "large abscess/pus ball"  . [place]   "they were wedged in the bowel"  . PATELLA FRACTURE SURGERY Left 1963  . PATELLA FRACTURE SURGERY Left ~ 1965   "removed knee cap"  . TONSILLECTOMY         Home Medications    Prior to Admission medications   Medication Sig Start Date  End Date Taking? Authorizing Provider  acetaminophen (TYLENOL) 500 MG tablet Take 1,000 mg by mouth every 6 (six) hours as needed for mild pain.    [provider]  albuterol (PROVENTIL) (2.5 MG/3ML) 0.083% nebulizer solution Take 3 mLs (2.5 mg total) by nebulization every 6 (six) hours as needed for wheezing or shortness of breath. 02/02/17   Juanito Doom, MD  amLODipine (NORVASC) 5 MG tablet Take 1 tablet (5 mg total) by mouth daily. 05/24/17   [name], MD  aspirin 81 MG EC tablet Take 1 tablet (81 mg total) by mouth daily. 08/07/16   Minus Liberty,  MD  atenolol (TENORMIN) 50 MG tablet Take 50 mg by mouth daily.  05/10/16   [provider]  atorvastatin (LIPITOR) 80 MG tablet Take 80 mg by mouth daily at 6 PM.  05/10/16   [provider]  benazepril (LOTENSIN) 20 MG tablet Take 20 mg by mouth daily.  05/26/16   [provider]  budesonide-formoterol (SYMBICORT) 160-4.5 MCG/ACT inhaler Inhale 2 puffs into the lungs 2 (two) times daily.    [provider]  predniSONE (DELTASONE) 20 MG tablet Take 2 pills for 5 days 07/01/17   [name], MD  [facility] HFA 108 [phone] Base) MCG/ACT inhaler Inhale 1-2 puffs into the lungs every 4 (four) hours as needed. 06/29/17   Juanito Doom, MD  SYNTHROID 125 MCG tablet Take 125 mcg by mouth daily before breakfast.  05/10/16   [provider]  Tiotropium Bromide Monohydrate (SPIRIVA RESPIMAT) 2.5 MCG/ACT AERS Inhale 2 puffs into the lungs daily. 06/29/17   Juanito Doom, MD    Family History Family History  Problem Relation Age of Onset  . Alcohol abuse Mother   . Alcohol abuse Father        not involved in life  . Alcoholism Sister   . Healthy Sister   . Healthy Sister     Social History Social History  Substance Use Topics  . Smoking status: Current Every Day Smoker    Packs/day: 1.00    Years: 45.00    Types: Cigarettes    Last attempt to quit: 08/03/2011  . Smokeless tobacco: Never Used     Comment: Now is smoking 10 cigarettes daily  . Alcohol use 8.4 oz/week    14 Shots of liquor per week     Comment: 08/05/2016 "2 shots of vodka/night"     Allergies   Ciprofloxacin and Omeprazole   Review of Systems Review of Systems  Unable to perform ROS: Severe respiratory distress     Physical Exam Updated Vital Signs There were no vitals taken for this visit.  Physical Exam  Constitutional: He appears well-developed and well-nourished. He appears distressed.  HENT:  Head: Normocephalic.  Right Ear: External ear normal.  Left Ear:  External ear normal.  Eyes: Pupils are equal, round, and reactive to light. Conjunctivae and EOM are normal.  Neck: Normal range of motion.  Cardiovascular: Normal rate and normal heart sounds.   Tachycardic 130s  Pulmonary/Chest: He is in respiratory distress.  Tripoding with accessory muscle use, respiratory rate 40 Decreased breath sounds bilaterally  Abdominal: Soft. He exhibits no mass. There is no tenderness. There is no guarding.  Musculoskeletal: Normal range of motion. He exhibits no edema, tenderness or deformity.  No leg swelling  Neurological: He is alert.  Moving all extremities, following commands  Skin: Skin is warm. Capillary refill takes less than 2 seconds.  ED Treatments / Results  Labs (all labs ordered are listed, but only abnormal results are displayed) Labs Reviewed  CBC WITH DIFFERENTIAL/PLATELET - Abnormal; Notable for the following:       Result Value   WBC 26.9 (*)    Neutro Abs 21.0 (*)    Monocytes Absolute 2.4 (*)    All other components within normal limits  BASIC METABOLIC PANEL - Abnormal; Notable for the following:    Chloride 100 (*)    Glucose, Bld 127 (*)    Calcium 8.7 (*)    All other components within normal limits  BRAIN NATRIURETIC PEPTIDE - Abnormal; Notable for the following:    B Natriuretic Peptide 209.6 (*)    All other components within normal limits  I-STAT ARTERIAL BLOOD GAS, ED - Abnormal; Notable for the following:    pH, Arterial 7.304 (*)    pCO2 arterial 58.4 (*)    pO2, Arterial 205.0 (*)    Bicarbonate 29.0 (*)    All other components within normal limits  I-STAT CG4 LACTIC ACID, ED - Abnormal; Notable for the following:    Lactic Acid, Venous 3.52 (*)    All other components within normal limits  CULTURE, BLOOD (ROUTINE X 2)  CULTURE, BLOOD (ROUTINE X 2)  CULTURE, EXPECTORATED SPUTUM-ASSESSMENT  GRAM STAIN  URINE CULTURE  TROPONIN I  PROCALCITONIN  URINALYSIS, ROUTINE W REFLEX MICROSCOPIC  STREP  PNEUMONIAE URINARY ANTIGEN  LACTIC ACID, PLASMA    EKG  EKG Interpretation  Date/Time:  Monday July 11 2017 00:43:14 EDT Ventricular Rate:  113 PR Interval:    QRS Duration: 88 QT Interval:  341 QTC Calculation: 468 R Axis:   23 Text Interpretation:  Sinus tachycardia Abnrm T, consider ischemia, anterolateral lds No significant change was found Confirmed by Ezequiel Essex [phone]) on 07/11/2017 12:51:19 AM       Radiology Dg Chest Portable 1 View  Result Date: 07/10/2017 CLINICAL DATA:  Shortness of breath.  On BiPAP. EXAM: PORTABLE CHEST 1 VIEW COMPARISON:  Chest CT Apr 08, 2017 FINDINGS: Cardiomediastinal silhouette is normal, status post median sternotomy for CABG fractured second most cephalad wire. Increased lung volumes with mild chronic interstitial changes. No pleural effusion or focal consolidation. No pneumothorax. Soft tissue planes and included osseous structures are nonsuspicious. IMPRESSION: COPD. Electronically Signed   By: Elon Alas M.D.   On: 07/10/2017 23:42    Procedures Procedures (including critical care time)  Medications Ordered in ED Medications  albuterol (PROVENTIL,VENTOLIN) solution continuous neb (not administered)  ipratropium (ATROVENT) nebulizer solution 0.5 mg (not administered)  magnesium sulfate IVPB 2 g 50 mL (not administered)     Initial Impression / Assessment and Plan / ED Course  I have reviewed the triage vital signs and the nursing notes.  Pertinent labs & imaging results that were available during my care of the patient were reviewed by me and considered in my medical decision making (see chart for details).    Respiratory distress, tripoding on arrival.  Diminished breath sounds.  No chest pain.  Patient given bronchodilators, steroids and magnesium. Placed on BiPAP on arrival. Chest x-ray shows no infiltrate or pneumothorax.  ABG shows mild CO2 retention. Patient improving on BiPAP. Given continuous nebulizer,  magnesium and antibiotics.  Patient with hypoxic respiratory failure secondary to COPD exacerbation. No evidence of heart failure at this time. No chest pain.  Remains on BiPAP but is more comfortable. Admission discussed with Dr. Loleta Books.  CRITICAL CARE Performed by: Ezequiel Essex Total  critical care time: 45 minutes Critical care time was exclusive of separately billable procedures and treating other patients. Critical care was necessary to treat or prevent imminent or life-threatening deterioration. Critical care was time spent personally by me on the following activities: development of treatment plan with patient and/or surrogate as well as nursing, discussions with consultants, evaluation of patient's response to treatment, examination of patient, obtaining history from patient or surrogate, ordering and performing treatments and interventions, ordering and review of laboratory studies, ordering and review of radiographic studies, pulse oximetry and re-evaluation of patient's condition.   Final Clinical Impressions(s) / ED Diagnoses   Final diagnoses:  Acute respiratory failure with hypoxia (HCC)  COPD exacerbation ([place])    New Prescriptions New Prescriptions   No medications on file     Ezequiel Essex, MD 07/11/17 [phone]

## 2017-07-11 ENCOUNTER — Other Ambulatory Visit: Payer: Self-pay

## 2017-07-11 ENCOUNTER — Encounter (HOSPITAL_COMMUNITY): Payer: Self-pay | Admitting: Family Medicine

## 2017-07-11 DIAGNOSIS — Z881 Allergy status to other antibiotic agents status: Secondary | ICD-10-CM | POA: Diagnosis not present

## 2017-07-11 DIAGNOSIS — E039 Hypothyroidism, unspecified: Secondary | ICD-10-CM | POA: Diagnosis present

## 2017-07-11 DIAGNOSIS — Z79899 Other long term (current) drug therapy: Secondary | ICD-10-CM | POA: Diagnosis not present

## 2017-07-11 DIAGNOSIS — E872 Acidosis: Secondary | ICD-10-CM | POA: Diagnosis present

## 2017-07-11 DIAGNOSIS — Z7982 Long term (current) use of aspirin: Secondary | ICD-10-CM | POA: Diagnosis not present

## 2017-07-11 DIAGNOSIS — I25810 Atherosclerosis of coronary artery bypass graft(s) without angina pectoris: Secondary | ICD-10-CM | POA: Diagnosis present

## 2017-07-11 DIAGNOSIS — J9622 Acute and chronic respiratory failure with hypercapnia: Secondary | ICD-10-CM | POA: Diagnosis present

## 2017-07-11 DIAGNOSIS — I10 Essential (primary) hypertension: Secondary | ICD-10-CM | POA: Diagnosis present

## 2017-07-11 DIAGNOSIS — J9602 Acute respiratory failure with hypercapnia: Secondary | ICD-10-CM | POA: Diagnosis present

## 2017-07-11 DIAGNOSIS — J9601 Acute respiratory failure with hypoxia: Secondary | ICD-10-CM

## 2017-07-11 DIAGNOSIS — Z7951 Long term (current) use of inhaled steroids: Secondary | ICD-10-CM | POA: Diagnosis not present

## 2017-07-11 DIAGNOSIS — G250 Essential tremor: Secondary | ICD-10-CM | POA: Diagnosis present

## 2017-07-11 DIAGNOSIS — M1712 Unilateral primary osteoarthritis, left knee: Secondary | ICD-10-CM | POA: Diagnosis present

## 2017-07-11 DIAGNOSIS — J441 Chronic obstructive pulmonary disease with (acute) exacerbation: Secondary | ICD-10-CM | POA: Diagnosis present

## 2017-07-11 DIAGNOSIS — J9621 Acute and chronic respiratory failure with hypoxia: Secondary | ICD-10-CM | POA: Diagnosis present

## 2017-07-11 DIAGNOSIS — I252 Old myocardial infarction: Secondary | ICD-10-CM | POA: Diagnosis not present

## 2017-07-11 DIAGNOSIS — I2510 Atherosclerotic heart disease of native coronary artery without angina pectoris: Secondary | ICD-10-CM | POA: Diagnosis present

## 2017-07-11 DIAGNOSIS — Z888 Allergy status to other drugs, medicaments and biological substances status: Secondary | ICD-10-CM | POA: Diagnosis not present

## 2017-07-11 DIAGNOSIS — Z951 Presence of aortocoronary bypass graft: Secondary | ICD-10-CM | POA: Diagnosis not present

## 2017-07-11 LAB — CBC WITH DIFFERENTIAL/PLATELET
BASOS ABS: 0 10*3/uL (ref 0.0–0.1)
Basophils Relative: 0 %
Eosinophils Absolute: 0 10*3/uL (ref 0.0–0.7)
Eosinophils Relative: 0 %
HEMATOCRIT: 44.1 % (ref 39.0–52.0)
HEMOGLOBIN: 15 g/dL (ref 13.0–17.0)
LYMPHS PCT: 13 %
Lymphs Abs: 3.5 10*3/uL (ref 0.7–4.0)
MCH: 31.8 pg (ref 26.0–34.0)
MCHC: 34 g/dL (ref 30.0–36.0)
MCV: 93.6 fL (ref 78.0–100.0)
MONOS PCT: 9 %
Monocytes Absolute: 2.4 10*3/uL — ABNORMAL HIGH (ref 0.1–1.0)
NEUTROS PCT: 78 %
Neutro Abs: 21 10*3/uL — ABNORMAL HIGH (ref 1.7–7.7)
Platelets: 325 10*3/uL (ref 150–400)
RBC: 4.71 MIL/uL (ref 4.22–5.81)
RDW: 15 % (ref 11.5–15.5)
WBC: 26.9 10*3/uL — AB (ref 4.0–10.5)

## 2017-07-11 LAB — URINALYSIS, ROUTINE W REFLEX MICROSCOPIC
Bilirubin Urine: NEGATIVE
Glucose, UA: 50 mg/dL — AB
HGB URINE DIPSTICK: NEGATIVE
KETONES UR: NEGATIVE mg/dL
Leukocytes, UA: NEGATIVE
Nitrite: NEGATIVE
PROTEIN: NEGATIVE mg/dL
Specific Gravity, Urine: 1.009 (ref 1.005–1.030)
pH: 5 (ref 5.0–8.0)

## 2017-07-11 LAB — TROPONIN I

## 2017-07-11 LAB — LACTIC ACID, PLASMA: Lactic Acid, Venous: 2.3 mmol/L (ref 0.5–1.9)

## 2017-07-11 LAB — MRSA PCR SCREENING: MRSA by PCR: NEGATIVE

## 2017-07-11 LAB — BASIC METABOLIC PANEL
ANION GAP: 13 (ref 5–15)
BUN: 9 mg/dL (ref 6–20)
CALCIUM: 8.7 mg/dL — AB (ref 8.9–10.3)
CHLORIDE: 100 mmol/L — AB (ref 101–111)
CO2: 25 mmol/L (ref 22–32)
Creatinine, Ser: 0.73 mg/dL (ref 0.61–1.24)
GFR calc non Af Amer: >60 mL/min (ref >60)
Glucose, Bld: 127 mg/dL — ABNORMAL HIGH (ref 65–99)
Potassium: 3.7 mmol/L (ref 3.5–5.1)
SODIUM: 138 mmol/L (ref 135–145)

## 2017-07-11 LAB — STREP PNEUMONIAE URINARY ANTIGEN: Strep Pneumo Urinary Antigen: NEGATIVE

## 2017-07-11 LAB — BRAIN NATRIURETIC PEPTIDE: B NATRIURETIC PEPTIDE 5: 209.6 pg/mL — AB (ref 0.0–100.0)

## 2017-07-11 LAB — PROCALCITONIN

## 2017-07-11 MED ORDER — DEXTROSE 5 % IV SOLN
1.0000 g | Freq: Every day | INTRAVENOUS | Status: DC
  Administered 2017-07-11: 1 g via INTRAVENOUS
  Filled 2017-07-11 (×2): qty 10

## 2017-07-11 MED ORDER — ORAL CARE MOUTH RINSE
15.0000 mL | Freq: Two times a day (BID) | OROMUCOSAL | Status: DC
  Administered 2017-07-11 – 2017-07-13 (×3): 15 mL via OROMUCOSAL

## 2017-07-11 MED ORDER — METHYLPREDNISOLONE SODIUM SUCC 40 MG IJ SOLR
40.0000 mg | Freq: Two times a day (BID) | INTRAMUSCULAR | Status: DC
  Administered 2017-07-11 – 2017-07-13 (×6): 40 mg via INTRAVENOUS
  Filled 2017-07-11 (×6): qty 1

## 2017-07-11 MED ORDER — NICOTINE 14 MG/24HR TD PT24
14.0000 mg | MEDICATED_PATCH | Freq: Every day | TRANSDERMAL | Status: DC
  Filled 2017-07-11: qty 1

## 2017-07-11 MED ORDER — ONDANSETRON HCL 4 MG PO TABS: 4.0000 mg | ORAL_TABLET | Freq: Four times a day (QID) | ORAL | Status: DC | PRN

## 2017-07-11 MED ORDER — TIOTROPIUM BROMIDE MONOHYDRATE 18 MCG IN CAPS
18.0000 ug | ORAL_CAPSULE | Freq: Every day | RESPIRATORY_TRACT | Status: DC
  Administered 2017-07-11 – 2017-07-13 (×3): 18 ug via RESPIRATORY_TRACT
  Filled 2017-07-11: qty 5

## 2017-07-11 MED ORDER — ALBUTEROL SULFATE (2.5 MG/3ML) 0.083% IN NEBU
2.5000 mg | INHALATION_SOLUTION | Freq: Four times a day (QID) | RESPIRATORY_TRACT | Status: DC
  Administered 2017-07-11 – 2017-07-12 (×7): 2.5 mg via RESPIRATORY_TRACT
  Filled 2017-07-11 (×6): qty 3

## 2017-07-11 MED ORDER — LEVOTHYROXINE SODIUM 25 MCG PO TABS
125.0000 ug | ORAL_TABLET | Freq: Every day | ORAL | Status: DC
  Administered 2017-07-12 – 2017-07-13 (×2): 125 ug via ORAL
  Filled 2017-07-11 (×3): qty 1

## 2017-07-11 MED ORDER — DOXYCYCLINE HYCLATE 100 MG IV SOLR
100.0000 mg | Freq: Once | INTRAVENOUS | Status: AC
  Administered 2017-07-11: 100 mg via INTRAVENOUS
  Filled 2017-07-11: qty 100

## 2017-07-11 MED ORDER — AZITHROMYCIN 500 MG PO TABS
500.0000 mg | ORAL_TABLET | Freq: Every day | ORAL | Status: DC
  Administered 2017-07-11 – 2017-07-13 (×3): 500 mg via ORAL
  Filled 2017-07-11 (×3): qty 1

## 2017-07-11 MED ORDER — ASPIRIN EC 81 MG PO TBEC
81.0000 mg | DELAYED_RELEASE_TABLET | Freq: Every day | ORAL | Status: DC
  Administered 2017-07-11 – 2017-07-13 (×3): 81 mg via ORAL
  Filled 2017-07-11 (×3): qty 1

## 2017-07-11 MED ORDER — ZOLPIDEM TARTRATE 5 MG PO TABS
5.0000 mg | ORAL_TABLET | Freq: Every evening | ORAL | Status: DC | PRN
  Administered 2017-07-11 – 2017-07-12 (×2): 5 mg via ORAL
  Filled 2017-07-11 (×2): qty 1

## 2017-07-11 MED ORDER — ALBUTEROL SULFATE (2.5 MG/3ML) 0.083% IN NEBU: 2.5000 mg | INHALATION_SOLUTION | RESPIRATORY_TRACT | Status: DC | PRN

## 2017-07-11 MED ORDER — ONDANSETRON HCL 4 MG/2ML IJ SOLN: 4.0000 mg | Freq: Four times a day (QID) | INTRAMUSCULAR | Status: DC | PRN

## 2017-07-11 MED ORDER — MOMETASONE FURO-FORMOTEROL FUM 200-5 MCG/ACT IN AERO
2.0000 | INHALATION_SPRAY | Freq: Two times a day (BID) | RESPIRATORY_TRACT | Status: DC
  Administered 2017-07-11 – 2017-07-13 (×5): 2 via RESPIRATORY_TRACT
  Filled 2017-07-11: qty 8.8

## 2017-07-11 MED ORDER — SODIUM CHLORIDE 0.9 % IV SOLN
INTRAVENOUS | Status: AC
  Administered 2017-07-11: 04:00:00 via INTRAVENOUS

## 2017-07-11 MED ORDER — ENOXAPARIN SODIUM 40 MG/0.4ML ~~LOC~~ SOLN
40.0000 mg | Freq: Every day | SUBCUTANEOUS | Status: DC
  Administered 2017-07-11 – 2017-07-13 (×3): 40 mg via SUBCUTANEOUS
  Filled 2017-07-11 (×4): qty 0.4

## 2017-07-11 MED ORDER — ACETAMINOPHEN 500 MG PO TABS
1000.0000 mg | ORAL_TABLET | Freq: Four times a day (QID) | ORAL | Status: DC | PRN
  Administered 2017-07-11 (×2): 1000 mg via ORAL
  Filled 2017-07-11 (×2): qty 2

## 2017-07-11 MED ORDER — DOXYCYCLINE HYCLATE 100 MG PO TABS
100.0000 mg | ORAL_TABLET | Freq: Two times a day (BID) | ORAL | Status: DC
  Administered 2017-07-11: 100 mg via ORAL
  Filled 2017-07-11: qty 1

## 2017-07-11 MED ORDER — SODIUM CHLORIDE 0.9 % IV BOLUS (SEPSIS)
500.0000 mL | Freq: Once | INTRAVENOUS | Status: AC
  Administered 2017-07-11: 500 mL via INTRAVENOUS

## 2017-07-11 MED ORDER — ATORVASTATIN CALCIUM 80 MG PO TABS
80.0000 mg | ORAL_TABLET | Freq: Every day | ORAL | Status: DC
  Administered 2017-07-11 – 2017-07-12 (×2): 80 mg via ORAL
  Filled 2017-07-11 (×3): qty 1

## 2017-07-11 MED ORDER — LORAZEPAM 0.5 MG PO TABS
0.5000 mg | ORAL_TABLET | Freq: Four times a day (QID) | ORAL | Status: DC | PRN
  Administered 2017-07-11 – 2017-07-12 (×2): 0.5 mg via ORAL
  Filled 2017-07-11 (×2): qty 1

## 2017-07-11 MED ORDER — ATENOLOL 50 MG PO TABS
50.0000 mg | ORAL_TABLET | Freq: Every day | ORAL | Status: DC
  Administered 2017-07-11 – 2017-07-13 (×3): 50 mg via ORAL
  Filled 2017-07-11 (×3): qty 1

## 2017-07-11 NOTE — ED Notes (Signed)
Attempted report 

## 2017-07-11 NOTE — Progress Notes (Signed)
Patient stated he was having a hard time taking a deep breath. Placed patient back on bipap.

## 2017-07-11 NOTE — H&P (Signed)
History and Physical  Patient Name: Brian Graham     CWC:[id number]    DOB: 01/06/1951    DOA: 07/10/2017 PCP: [name], MD  Patient coming from: Home  Chief Complaint: Dyspnea, sputum production      HPI: Brian Graham is a 66 y.o. male with a past medical history significant for COPD still smoking, CAD s/p CABG in 2004, HTN and hypothyroidism who presents with acute dyspnea.  The patient was in his usual state of health until last week when he woke up with some sore throat and earache, was seen by his PCP, prescribed prednisone 40 for 5 days and Augmentin for 7 days, and also recommended to take Mucinex.  His symptoms were persistent, and yesterday he chest congestion and cough again and burning in his chest (he thought this was from taking Mucinex that "dried me out").  Then this morning, his cough persisted, and he was SOB.  His nebs helped initially, but by this evening, they weren't helping at all anymore, he was feeling SOB with any exertion, dizzy, and working to breathe, so his wife called EMS. They found him with SpO2 88% and tripodding, so placed CPAP, gave solu-medrol 125 and bronchodilators.    ED course: -Afebrile, heart rate 133, respirations 33, pulse ox 100% on BiPAP, BP 123/94 -Na 138, K 3.7, Cr 0.73, WBC 26.9K and left shift, Hgb 15 -BNP 209 -Lactic acid 3.52 -Troponin normal -CXR showed emphysema, no focal opacity -ABG showed pH 7.3, pCO2 58, bicarb 25 -ECG showed sinus tachycardia -He got gentle fluids, mag, doxycycline -Was able to come off BiPAP but appeared uncomfortable and TRH were asked to evaluate for COPD flare     ROS: Review of Systems  Constitutional: Negative for chills, fever and malaise/fatigue.  Respiratory: Positive for cough, sputum production, shortness of breath and wheezing.   Cardiovascular: Positive for chest pain. Negative for palpitations, orthopnea, leg swelling and PND.  All other systems reviewed and are  negative.         Past Medical History:  Diagnosis Date  . Arthritis    "left knee" (08/05/2016)  . CAD (coronary artery disease) of artery bypass graft 08/05/2016   Around age 30. CABG - 3 vessel.   . Colon polyp   . COPD, severe ([place]) 08/23/2016   Alpha 1 studies normal per prior pulmonary group notes Smoked 40 years, quit in 2012 June 2016 PFT from prior pulmonary group: "significant obstruction" FEV1 1.58L (47% pred), Residual volume 171% pred, DLCO 59% pred Simple Spirometry>> 09/15/2016 ratio 42% FEV1 1.02 L / 31%   . Essential tremor 06/11/2016   Plans to see Dr. Carles Collet  . Former smoker 09/01/2016   Quit 2012. 40 pack years at least.   . GERD (gastroesophageal reflux disease)   . History of blood transfusion 08/1997   "when he had his heart surgery"  . History of shingles 1970-2013 X 3  . HTN (hypertension) 08/05/2016   Amlodipine 5mg , atenolol 50mg  BID, benazepril 20mg   . Hyperlipemia   . Hypothyroidism   . Lumbar disc disease   . Myocardial infarction [facility]) 08/1997    Past Surgical History:  Procedure Laterality Date  . BACK SURGERY    . CARDIAC CATHETERIZATION  08/1997  . CORONARY ARTERY BYPASS GRAFT  09/12/1997   "triple"  . INGUINAL HERNIA REPAIR Right 1961  . KNEE RECONSTRUCTION Left 1960s - 1974 X 4  . LAPAROSCOPIC ABLATION RENAL MASS  ~ 2014   "large abscess/pus ball"  .  [place]   "they were wedged in the bowel"  . PATELLA FRACTURE SURGERY Left 1963  . PATELLA FRACTURE SURGERY Left ~ 1965   "removed knee cap"  . TONSILLECTOMY      Social History: Patient lives with his wife .  The patient walks unassisted.  Smoker.  From [place] originally, lived [place] most of his life, now retired Programmer, applications. Worked for [employer].   Allergies  Allergen Reactions  . Ciprofloxacin Diarrhea  . Omeprazole Diarrhea    Family history: family history includes Alcohol abuse in his father and mother; Alcoholism in his sister; Healthy in his  sister and sister.  Prior to Admission medications   Medication Sig Start Date End Date Taking? Authorizing Provider  acetaminophen (TYLENOL) 500 MG tablet Take 1,000 mg by mouth every 6 (six) hours as needed for mild pain.   Yes [provider]  albuterol (PROVENTIL) (2.5 MG/3ML) 0.083% nebulizer solution Take 3 mLs (2.5 mg total) by nebulization every 6 (six) hours as needed for wheezing or shortness of breath. 02/02/17  Yes Juanito Doom, MD  amLODipine (NORVASC) 5 MG tablet Take 1 tablet (5 mg total) by mouth daily. 05/24/17  Yes [name], MD  aspirin 81 MG EC tablet Take 1 tablet (81 mg total) by mouth daily. 08/07/16  Yes Minus Liberty, MD  atenolol (TENORMIN) 50 MG tablet Take 50 mg by mouth daily.  05/10/16  Yes [provider]  atorvastatin (LIPITOR) 80 MG tablet Take 80 mg by mouth daily at 6 PM.  05/10/16  Yes [provider]  benazepril (LOTENSIN) 20 MG tablet Take 20 mg by mouth daily.  05/26/16  Yes [provider]  budesonide-formoterol (SYMBICORT) 160-4.5 MCG/ACT inhaler Inhale 2 puffs into the lungs 2 (two) times daily.   Yes [provider]  PROAIR HFA 108 (90 Base) MCG/ACT inhaler Inhale 1-2 puffs into the lungs every 4 (four) hours as needed. Patient taking differently: Inhale 1-2 puffs into the lungs every 4 (four) hours as needed for wheezing or shortness of breath.  06/29/17  Yes Juanito Doom, MD  SYNTHROID 125 MCG tablet Take 125 mcg by mouth daily before breakfast.  05/10/16  Yes [provider]  Tiotropium Bromide Monohydrate (SPIRIVA RESPIMAT) 2.5 MCG/ACT AERS Inhale 2 puffs into the lungs daily. 06/29/17  Yes Juanito Doom, MD       Physical Exam: BP (!) 138/55   Pulse (!) 113   Temp 98.3 F (36.8 C) (Axillary)   Resp (!) 23   SpO2 96%  General appearance: Thin elderly adult male, alert and in moderate distress from dyspnea.   Eyes: Anicteric, conjunctiva pink, lids and lashes normal.  PERRL.    ENT: No nasal deformity, discharge, epistaxis.  Hearing normal. OP dry without lesions.  Mustache tarnished from smoking. Neck: No neck masses.  Trachea midline.  Retractions noted. No thyromegaly/tenderness. Lymph: No cervical or supraclavicular lymphadenopathy. Skin: Warm and dry.  No jaundice.  No suspicious rashes or lesions. Cardiac: Tachycardic, regular, nl S1-S2, no murmurs appreciated.  Capillary refill is brisk.  JVP normal.  No LE edema.  Radial and DP pulses 2+ and symmetric. Respiratory: Tachypneic, in moderate respiratory distress, speaks in short phrases, lungs tight, wheezes are few. Abdomen: Abdomen soft.  No TTP. No ascites, distension, hepatosplenomegaly.   MSK: No deformities or effusions.  No cyanosis or clubbing. Neuro: Cranial nerves normal.  Sensation intact to light touch. Speech is fluent.  Muscle  strength normal.    Psych: Sensorium intact and responding to questions, attention normal.  Behavior appropriate.  Affect normal.  Judgment and insight appear normal.     Labs on Admission:  I have personally reviewed following labs and imaging studies: CBC:  Recent Labs Lab 07/10/17 2310  WBC 26.9*  NEUTROABS 21.0*  HGB 15.0  HCT 44.1  MCV 93.6  PLT 272   Basic Metabolic Panel:  Recent Labs Lab 07/10/17 2310  NA 138  K 3.7  CL 100*  CO2 25  GLUCOSE 127*  BUN 9  CREATININE 0.73  CALCIUM 8.7*   GFR: Estimated Creatinine Clearance: 77.2 mL/min (by C-G formula based on SCr of 0.73 mg/dL).  Liver Function Tests: No results for input(s): AST, ALT, ALKPHOS, BILITOT, PROT, ALBUMIN in the last 168 hours. No results for input(s): LIPASE, AMYLASE in the last 168 hours. No results for input(s): AMMONIA in the last 168 hours. Coagulation Profile: No results for input(s): INR, PROTIME in the last 168 hours. Cardiac Enzymes:  Recent Labs Lab 07/10/17 2310  TROPONINI <0.03   BNP (last 3 results) No results for input(s): PROBNP in the last 8760  hours. HbA1C: No results for input(s): HGBA1C in the last 72 hours. CBG: No results for input(s): GLUCAP in the last 168 hours. Lipid Profile: No results for input(s): CHOL, HDL, LDLCALC, TRIG, CHOLHDL, LDLDIRECT in the last 72 hours. Thyroid Function Tests: No results for input(s): TSH, T4TOTAL, FREET4, T3FREE, THYROIDAB in the last 72 hours. Anemia Panel: No results for input(s): VITAMINB12, FOLATE, FERRITIN, TIBC, IRON, RETICCTPCT in the last 72 hours. Sepsis Labs: Lactic acid 3.5 Invalid input(s): PROCALCITONIN, LACTICIDVEN No results found for this or any previous visit (from the past 240 hour(s)).       Radiological Exams on Admission: Personally reviewed CXR shows no focal opacity, appears expanded from emphysema: Dg Chest Portable 1 View  Result Date: 07/10/2017 CLINICAL DATA:  Shortness of breath.  On BiPAP. EXAM: PORTABLE CHEST 1 VIEW COMPARISON:  Chest CT Apr 08, 2017 FINDINGS: Cardiomediastinal silhouette is normal, status post median sternotomy for CABG fractured second most cephalad wire. Increased lung volumes with mild chronic interstitial changes. No pleural effusion or focal consolidation. No pneumothorax. Soft tissue planes and included osseous structures are nonsuspicious. IMPRESSION: COPD. Electronically Signed   By: Elon Alas M.D.   On: 07/10/2017 23:42    EKG: Independently reviewed. Rate 113, QTc 468, no ST changes.      Assessment/Plan  1. Acute hypoxic and hypercarbic respiratory failure from COPD flare:  Do not suspect a component of CHF at time of presentation.  Lungs are tight, nebs helped earlier today.  No active chest pain.   -Solumedrol 40 BID -Nebs scheduled and PRN -CTX and doxycycline (narrow to doxy if procal low) -BiPAP as needed tonight -Continue Advair and Spiriva -Smoking cessation strongly encouraged, modalities discussed, nursing teaching ordered   2. Possible sepsis:  Unclear.  CXR negative, but high risk, leukocytosis  with left shift and elevated lactic acid. -Obtain cultures -Check procalcitonin and de-escalate antibiotics as able  3. Hypertension and CV disease secondary prevention:  -Continue aspriin, statin -Continue BB -Hold ACEi and amlodipine until hemodynamics clearer  4. Hypothyroidism:  -Continue levothyroxine      DVT prophylaxis: Lovenox  Code Status: FULl  Family Communication: Wife at bedside  Disposition Plan: Anticipate IV steroids, nebs and antibiotics.  Narrow antibiotics if able.   Consults called: None Admission status: INPATIENT    Medical decision making: Patient  seen at 1:20 AM on 07/11/2017.  The patient was discussed with Dr. Wyvonnia Dusky.  What exists of the patient's chart was reviewed in depth and summarized above.  Clinical condition: improving on Bipap, now oxygenating well of BiPAP but high risk to need again, will watch in stepdown overnight and use BiPAP as necesary.        Edwin Dada [facility] Pager [phone]     At the time of admission, it appears that the appropriate admission status for this patient is INPATIENT. This is judged to be reasonable and necessary in order to provide the required intensity of service to ensure the patient's safety given the presenting symptoms, physical exam findings, and initial radiographic and laboratory data in the context of their chronic comorbidities.  Together, these circumstances are felt to place him at high risk for further clinical deterioration threatening life, limb, or organ.   Patient requires inpatient status due to high intensity of service, high risk for further deterioration and high frequency of surveillance required because of this severe exacerbation of their chronic organ failure  Factors that support inpatient status include presentation in respiratory distres with SpO2 in 80s, hypercarbia and poor air movement in setting of chronic COPD, and coronary disease with history of CABG.  I  certify that at the point of admission it is my clinical judgment that the patient will require inpatient hospital care spanning beyond 2 midnights from the point of admission and that early discharge would result in unnecessary risk of decompensation and readmission or threat to life, limb or bodily function.

## 2017-07-11 NOTE — ED Notes (Signed)
Admitting MD at bedside.

## 2017-07-11 NOTE — Progress Notes (Addendum)
Patient was seen and examined in ER. Patient was admitted earlier today by Dr. Loleta Books. Please see his assessment and plan for detail.  66 year old male with history of COPD not on home oxygen presented with shortness of breath. Patient with acute hypoxic and hypercapnic respiratory failure from COPD exacerbation. Currently feels better on 2 L of oxygen. Patient continue to have wheezing and SOB. Continue bronchodilators, Solu-Medrol. Pro-calcitonin level low therefore discontinue doxycycline and ceftriaxone and changed to azithromycin because of history of COPD. Patient is active smoker, education provided to the patient to quit smoking. Follow up culture results and monitor labs.  DVT prophylaxis with Lovenox subcutaneous.

## 2017-07-11 NOTE — Progress Notes (Signed)
BiPAP on standby in the room

## 2017-07-11 NOTE — Progress Notes (Signed)
Removed patient from bipap and placed patient on 2lpm nasal cannula. Patient stated he was breathing a lot better and showed no signs of respiratory distress. Instructed patient and wife to notify nurse if patient became short of breath. Nurse and doctor are aware of the change.

## 2017-07-12 DIAGNOSIS — J9601 Acute respiratory failure with hypoxia: Secondary | ICD-10-CM

## 2017-07-12 LAB — CBC
HEMATOCRIT: 38.3 % — AB (ref 39.0–52.0)
Hemoglobin: 12.3 g/dL — ABNORMAL LOW (ref 13.0–17.0)
MCH: 30.5 pg (ref 26.0–34.0)
MCHC: 32.1 g/dL (ref 30.0–36.0)
MCV: 95 fL (ref 78.0–100.0)
PLATELETS: 274 10*3/uL (ref 150–400)
RBC: 4.03 MIL/uL — ABNORMAL LOW (ref 4.22–5.81)
RDW: 15.7 % — AB (ref 11.5–15.5)
WBC: 21.8 10*3/uL — AB (ref 4.0–10.5)

## 2017-07-12 MED ORDER — DM-GUAIFENESIN ER 30-600 MG PO TB12
1.0000 | ORAL_TABLET | Freq: Two times a day (BID) | ORAL | Status: DC
  Filled 2017-07-12: qty 1

## 2017-07-12 NOTE — Progress Notes (Signed)
PROGRESS NOTE    Brian Graham  PXT:[id number] DOB: October 09, 1951 DOA: 07/10/2017 PCP: [name], MD   Brief Narrative: 66 year old male with history of COPD not on home oxygen presented with shortness of breath. Patient with acute hypoxic and hypercapnic respiratory failure from COPD exacerbation. Requiring BiPAP on admission.  Assessment & Plan:  # Acute COPD exacerbation: Shortness of breath has improved. Patient has minimal wheezing. He still desaturates to 80s on room air. Continue Solu-Medrol, bronchodilators today. Pro-calcitonin level low therefore discontinue ceftriaxone and doxycycline, continue azithromycin oral the cause of history of COPD. -Monitor leukocytosis. Urine culture positive but patient denied any urinary symptoms.  #Acute respiratory failure with hypoxia and hypercapnia: Off BiPAP. Continue 2 L of oxygen. Try to wean down ox and gradually. Patient desaturated significantly on room air and with ambulation. He does not use oxygen at home.  #Hypertension: Continue atenolol. Monitor blood pressure  #Hypothyroidism: Continue Synthroid  #Tobacco dependence: Continue no code in the past. Patient was counseled.  PT OT evaluation. Patient is off BiPAP. I'll transfer patient to MedSurg floor.  DVT prophylaxis: Lovenox subcutaneous Code Status: Full code Family Communication: No family at bedside Disposition Plan: Transfer to [place] floor. Likely discharge home tomorrow if patient oxygenation improved.    Consultants:   None  Procedures: BiPAP Antimicrobials: Azithromycin  Subjective: Seen and examined at bedside. Has minimal wheeze but shortness of breath improved. Has mild tremor. Denied headache, dizziness, chest pain. No nausea or vomiting.  Objective: Vitals:   07/12/17 0300 07/12/17 0740 07/12/17 0800 07/12/17 0935  BP: 132/79   129/63  Pulse: 68     Resp: (!) 22     Temp:   98.4 F (36.9 C)   TempSrc:   Oral   SpO2: 97% 98%      Weight:      Height:        Intake/Output Summary (Last 24 hours) at 07/12/17 0954 Last data filed at 07/12/17 0900  Gross per 24 hour  Intake              390 ml  Output             1050 ml  Net             -660 ml   Filed Weights   07/11/17 1434  Weight: 62.8 kg (138 lb 7.2 oz)    Examination:  General exam: Appears calm and comfortable  Respiratory system: Minimal expiratory wheeze, respiratory effort normal Cardiovascular system: S1 & S2 heard, RRR.  No pedal edema. Gastrointestinal system: Abdomen is nondistended, soft and nontender. Normal bowel sounds heard. Central nervous system: Alert and oriented. No focal neurological deficits. Extremities: Symmetric 5 x 5 power. Skin: No rashes, lesions or ulcers Psychiatry: Judgement and insight appear normal. Mood & affect appropriate.     Data Reviewed: I have personally reviewed following labs and imaging studies  CBC:  Recent Labs Lab 07/10/17 2310 07/12/17 0528  WBC 26.9* 21.8*  NEUTROABS 21.0*  --   HGB 15.0 12.3*  HCT 44.1 38.3*  MCV 93.6 95.0  PLT 325 627   Basic Metabolic Panel:  Recent Labs Lab 07/10/17 2310  NA 138  K 3.7  CL 100*  CO2 25  GLUCOSE 127*  BUN 9  CREATININE 0.73  CALCIUM 8.7*   GFR: Estimated Creatinine Clearance: 79.6 mL/min (by C-G formula based on SCr of 0.73 mg/dL). Liver Function Tests: No results for input(s): AST, ALT, ALKPHOS, BILITOT, PROT, ALBUMIN in  the last 168 hours. No results for input(s): LIPASE, AMYLASE in the last 168 hours. No results for input(s): AMMONIA in the last 168 hours. Coagulation Profile: No results for input(s): INR, PROTIME in the last 168 hours. Cardiac Enzymes:  Recent Labs Lab 07/10/17 2310  TROPONINI <0.03   BNP (last 3 results) No results for input(s): PROBNP in the last 8760 hours. HbA1C: No results for input(s): HGBA1C in the last 72 hours. CBG: No results for input(s): GLUCAP in the last 168 hours. Lipid Profile: No results  for input(s): CHOL, HDL, LDLCALC, TRIG, CHOLHDL, LDLDIRECT in the last 72 hours. Thyroid Function Tests: No results for input(s): TSH, T4TOTAL, FREET4, T3FREE, THYROIDAB in the last 72 hours. Anemia Panel: No results for input(s): VITAMINB12, FOLATE, FERRITIN, TIBC, IRON, RETICCTPCT in the last 72 hours. Sepsis Labs:  Recent Labs Lab 07/10/17 2310 07/10/17 2323 07/11/17 0356  PROCALCITON <0.10  --   --   LATICACIDVEN  --  3.52* 2.3*    Recent Results (from the past 240 hour(s))  Culture, blood (routine x 2) Call MD if unable to obtain prior to antibiotics being given     Status: None (Preliminary result)   Collection Time: 07/11/17  3:56 AM  Result Value Ref Range Status   Specimen Description BLOOD RIGHT FOREARM  Final   Special Requests   Final    BOTTLES DRAWN AEROBIC AND ANAEROBIC Blood Culture adequate volume   Culture PENDING  Incomplete   Report Status PENDING  Incomplete  Culture, Urine     Status: Abnormal (Preliminary result)   Collection Time: 07/11/17  4:42 AM  Result Value Ref Range Status   Specimen Description URINE, CLEAN CATCH  Final   Special Requests NONE  Final   Culture >=100,000 COLONIES/mL UNIDENTIFIED ORGANISM (A)  Final   Report Status PENDING  Incomplete  MRSA PCR Screening     Status: None   Collection Time: 07/11/17  2:30 PM  Result Value Ref Range Status   MRSA by PCR NEGATIVE NEGATIVE Final    Comment:        The GeneXpert MRSA Assay (FDA approved for NASAL specimens only), is one component of a comprehensive MRSA colonization surveillance program. It is not intended to diagnose MRSA infection nor to guide or monitor treatment for MRSA infections.          Radiology Studies: Dg Chest Portable 1 View  Result Date: 07/10/2017 CLINICAL DATA:  Shortness of breath.  On BiPAP. EXAM: PORTABLE CHEST 1 VIEW COMPARISON:  Chest CT Apr 08, 2017 FINDINGS: Cardiomediastinal silhouette is normal, status post median sternotomy for CABG fractured  second most cephalad wire. Increased lung volumes with mild chronic interstitial changes. No pleural effusion or focal consolidation. No pneumothorax. Soft tissue planes and included osseous structures are nonsuspicious. IMPRESSION: COPD. Electronically Signed   By: Elon Alas M.D.   On: 07/10/2017 23:42        Scheduled Meds: . albuterol  2.5 mg Nebulization Q6H  . aspirin EC  81 mg Oral Daily  . atenolol  50 mg Oral Daily  . atorvastatin  80 mg Oral q1800  . azithromycin  500 mg Oral Daily  . enoxaparin (LOVENOX) injection  40 mg Subcutaneous Daily  . levothyroxine  125 mcg Oral QAC breakfast  . mouth rinse  15 mL Mouth Rinse BID  . methylPREDNISolone (SOLU-MEDROL) injection  40 mg Intravenous BID  . mometasone-formoterol  2 puff Inhalation BID  . nicotine  14 mg Transdermal Daily  .  tiotropium  18 mcg Inhalation Daily   Continuous Infusions:   LOS: 1 day    Weaver Tweed Tanna Furry, MD [facility] Pager [phone]  If 7PM-7AM, please contact night-coverage [email or web address] Password [facility] 07/12/2017, 9:54 AM

## 2017-07-12 NOTE — Progress Notes (Signed)
BiPAP is in room on standby. Will continue to monitor PT throughout the night.

## 2017-07-12 NOTE — Progress Notes (Signed)
Desats to 86% on bedrest and on RA, placed back on O2 2l [place] sats- 94%

## 2017-07-13 ENCOUNTER — Telehealth: Payer: Self-pay | Admitting: Pulmonary Disease

## 2017-07-13 ENCOUNTER — Other Ambulatory Visit: Payer: Self-pay | Admitting: *Deleted

## 2017-07-13 ENCOUNTER — Encounter (HOSPITAL_COMMUNITY): Payer: Self-pay | Admitting: Vascular Surgery

## 2017-07-13 DIAGNOSIS — J441 Chronic obstructive pulmonary disease with (acute) exacerbation: Principal | ICD-10-CM

## 2017-07-13 LAB — CBC
HCT: 39.6 % (ref 39.0–52.0)
Hemoglobin: 12.6 g/dL — ABNORMAL LOW (ref 13.0–17.0)
MCH: 30.9 pg (ref 26.0–34.0)
MCHC: 31.8 g/dL (ref 30.0–36.0)
MCV: 97.1 fL (ref 78.0–100.0)
PLATELETS: 312 10*3/uL (ref 150–400)
RBC: 4.08 MIL/uL — AB (ref 4.22–5.81)
RDW: 15.7 % — ABNORMAL HIGH (ref 11.5–15.5)
WBC: 15.9 10*3/uL — ABNORMAL HIGH (ref 4.0–10.5)

## 2017-07-13 LAB — PROCALCITONIN: Procalcitonin: 1.05 ng/mL

## 2017-07-13 LAB — URINE CULTURE

## 2017-07-13 MED ORDER — ALBUTEROL SULFATE (2.5 MG/3ML) 0.083% IN NEBU
2.5000 mg | INHALATION_SOLUTION | Freq: Three times a day (TID) | RESPIRATORY_TRACT | Status: DC
  Administered 2017-07-13: 2.5 mg via RESPIRATORY_TRACT
  Filled 2017-07-13: qty 3

## 2017-07-13 MED ORDER — AMOXICILLIN-POT CLAVULANATE 875-125 MG PO TABS: 1.0000 | ORAL_TABLET | Freq: Two times a day (BID) | ORAL | 0 refills | Status: AC

## 2017-07-13 MED ORDER — ALBUTEROL SULFATE (2.5 MG/3ML) 0.083% IN NEBU: 2.5000 mg | INHALATION_SOLUTION | Freq: Two times a day (BID) | RESPIRATORY_TRACT | Status: DC

## 2017-07-13 MED ORDER — PREDNISONE 10 MG PO TABS: ORAL_TABLET | ORAL | 0 refills | Status: DC

## 2017-07-13 NOTE — Discharge Summary (Signed)
Discharge Summary  Brian Graham DDU:[id number] DOB: 1951/01/11  PCP: [name], MD  Admit date: 07/10/2017 Discharge date: 07/13/2017  Time spent: >69mins, more than 50% time spent on coordination of care, case discussed with vascular surgery Dr Bridgett Larsson over the phone, home o2 arranged  Recommendations for Outpatient Follow-up:  1. F/u with PMD within a week  for hospital discharge follow up, repeat cbc/bmp at follow up 2. F/u with vascular surgery Dr Bridgett Larsson 3. Follow up with pulmonology for copd management and perioperative pulmonary eval if needed 4. F/u with urology Dr Tresa Moore for recurrent uti. 5. Home o2 arranged  Discharge Diagnoses:  Active Hospital Problems   Diagnosis Date Noted  . COPD exacerbation ([place]) 07/11/2017  . Acute respiratory failure with hypoxia ([place])   . Acute respiratory acidosis 07/11/2017  . Hypothyroidism (acquired) 04/04/2017  . CAD (coronary artery disease) of artery bypass graft 08/05/2016  . Essential hypertension 08/05/2016  . Essential tremor 06/11/2016    Resolved Hospital Problems   Diagnosis Date Noted Date Resolved  No resolved problems to display.    Discharge Condition: stable  Diet recommendation: heart healthy  Filed Weights   07/11/17 1434  Weight: 62.8 kg (138 lb 7.2 oz)    History of present illness:  Per admitting MD Dr Loleta Books PCP: [name], MD  Patient coming from: Home  Chief Complaint: Dyspnea, sputum production   HPI: Brian Graham is a 66 y.o. male with a past medical history significant for COPD still smoking, CAD s/p CABG in 2004, HTN and hypothyroidism who presents with acute dyspnea.  The patient was in his usual state of health until last week when he woke up with some sore throat and earache, was seen by his PCP, prescribed prednisone 40 for 5 days and Augmentin for 7 days, and also recommended to take Mucinex.  His symptoms were persistent, and yesterday he chest congestion and  cough again and burning in his chest (he thought this was from taking Mucinex that "dried me out").  Then this morning, his cough persisted, and he was SOB.  His nebs helped initially, but by this evening, they weren't helping at all anymore, he was feeling SOB with any exertion, dizzy, and working to breathe, so his wife called EMS. They found him with SpO2 88% and tripodding, so placed CPAP, gave solu-medrol 125 and bronchodilators.    ED course: -Afebrile, heart rate 133, respirations 33, pulse ox 100% on BiPAP, BP 123/94 -Na 138, K 3.7, Cr 0.73, WBC 26.9K and left shift, Hgb 15 -BNP 209 -Lactic acid 3.52 -Troponin normal -CXR showed emphysema, no focal opacity -ABG showed pH 7.3, pCO2 58, bicarb 25 -ECG showed sinus tachycardia -He got gentle fluids, mag, doxycycline -Was able to come off BiPAP but appeared uncomfortable and TRH were asked to evaluate for COPD flare    Hospital Course:  Principal Problem:   COPD exacerbation ([place]) Active Problems:   Essential tremor   CAD (coronary artery disease) of artery bypass graft   Essential hypertension   Hypothyroidism (acquired)   Acute respiratory acidosis   Acute respiratory failure with hypoxia (HCC)    Acute COPD exacerbation/ Acute on chronic respiratory failure with hypoxia and hypercapnia: he initially required BiPAP.  -He is treated with iv Solu-Medrol, bronchodilators, he was on ceftriaxone, doxycycline,  Azithromycin .Pro-calcitonin level low - blood culture no growth, mrsa screening negative, urine strep pneumo antigen negative, sputum was not collected -He has been off bipap, however, 02sats at rest  87-90% , he desats to the 80's (83-86%) when ambulating, he reports did not feel sob when he desats, suspect patient has chronic respiratory failure at baseline, he is discharged on home 02 with 2liters and outpatient follow up with pulmonology.  -he is discharged on augmentin, steroids taper  H/o recurrent uti,  followed by urology Dr Tresa Moore, Urine culture positive for enterococcus but patient denied any urinary symptoms. He is discharged on augmentin, and outpatient urology follow up.  #Hypertension: Continue atenolol. Monitor blood pressure  #Hypothyroidism: Continue Synthroid  H/o pvd, he is scheduled to have angiogram with possible intervention on 8/23, I have discussed with vascular Surgery Dr Bridgett Larsson, patient is to see Dr Bridgett Larsson tomorrow  To decide whether to proceed with procedure or not.  H/o cad s/p CABG, no chest pain, he is to continue follow with cardiology Dr Percival Spanish  #Tobacco dependence:   He reports was able to quit for a while in the past, but has started back .  Patient is encouraged to quit smoking, he agreed to try nicotine patch.   DVT prophylaxis while in the hospital: Lovenox subcutaneous Code Status: Full code Family Communication: No family at bedside Disposition Plan:   discharge home  With home o2    Consultants:   Phone conversation with vascular surgery Dr Bridgett Larsson  Procedures: BiPAP Antimicrobials: Azithromycin   Discharge Exam: BP 136/72   Pulse 68   Temp 98.4 F (36.9 C) (Oral)   Resp (!) 21   Ht 5\' 9"  (1.753 m)   Wt 62.8 kg (138 lb 7.2 oz)   SpO2 92%   BMI 20.45 kg/m   General: NAD Cardiovascular: RRR Respiratory: over all diminished, mild end expiratory wheezing Extremity: no edema  Discharge Instructions You were cared for by a hospitalist during your hospital stay. If you have any questions about your discharge medications or the care you received while you were in the hospital after you are discharged, you can call the unit and asked to speak with the hospitalist on call if the hospitalist that took care of you is not available. Once you are discharged, your primary care physician will handle any further medical issues. Please note that NO REFILLS for any discharge medications will be authorized once you are discharged, as it is imperative  that you return to your primary care physician (or establish a relationship with a primary care physician if you do not have one) for your aftercare needs so that they can reassess your need for medications and monitor your lab values.  Discharge Instructions    Diet - low sodium heart healthy    Complete by:  As directed    For home use only DME oxygen    Complete by:  As directed    With activity   Mode or (Route):  Nasal cannula   Liters per Minute:  2   Frequency:  Continuous (stationary and portable oxygen unit needed)   Oxygen conserving device:  Yes   Oxygen delivery system:  Gas   Increase activity slowly    Complete by:  As directed      Allergies as of 07/13/2017      Reactions   Ciprofloxacin Diarrhea   Omeprazole Diarrhea      Medication List    TAKE these medications   acetaminophen 500 MG tablet Commonly known as:  TYLENOL Take 1,000 mg by mouth every 6 (six) hours as needed for mild pain.   albuterol (2.5 MG/3ML) 0.083% nebulizer solution Commonly known  as:  PROVENTIL Take 3 mLs (2.5 mg total) by nebulization every 6 (six) hours as needed for wheezing or shortness of breath. What changed:  Another medication with the same name was changed. Make sure you understand how and when to take each.   PROAIR HFA 108 (90 Base) MCG/ACT inhaler Generic drug:  albuterol Inhale 1-2 puffs into the lungs every 4 (four) hours as needed. What changed:  reasons to take this   amLODipine 5 MG tablet Commonly known as:  NORVASC Take 1 tablet (5 mg total) by mouth daily.   amoxicillin-clavulanate 875-125 MG tablet Commonly known as:  AUGMENTIN Take 1 tablet by mouth 2 (two) times daily.   aspirin 81 MG EC tablet Take 1 tablet (81 mg total) by mouth daily.   atenolol 50 MG tablet Commonly known as:  TENORMIN Take 50 mg by mouth daily.   atorvastatin 80 MG tablet Commonly known as:  LIPITOR Take 80 mg by mouth daily at 6 PM.   benazepril 20 MG tablet Commonly known  as:  LOTENSIN Take 20 mg by mouth daily.   budesonide-formoterol 160-4.5 MCG/ACT inhaler Commonly known as:  SYMBICORT Inhale 2 puffs into the lungs 2 (two) times daily.   predniSONE 10 MG tablet Commonly known as:  DELTASONE Label  & dispense according to the schedule below. 6 Pills PO on day one then, 5 Pills PO on day two, 4 Pills PO on day three, 3Pills PO on day four, 2 Pills PO on day five, 1 Pills PO on day six,  then STOP.  Total of 21 tabs   SYNTHROID 125 MCG tablet Generic drug:  levothyroxine Take 125 mcg by mouth daily before breakfast.   Tiotropium Bromide Monohydrate 2.5 MCG/ACT Aers Commonly known as:  SPIRIVA RESPIMAT Inhale 2 puffs into the lungs daily.            Durable Medical Equipment        Start     Ordered   07/13/17 0000  For home use only DME oxygen    Comments:  With activity  Question Answer Comment  Mode or (Route) Nasal cannula   Liters per Minute 2   Frequency Continuous (stationary and portable oxygen unit needed)   Oxygen conserving device Yes   Oxygen delivery system Gas      07/13/17 1244       Discharge Care Instructions        Start     Ordered   07/13/17 0000  Increase activity slowly     07/13/17 1029   07/13/17 0000  Diet - low sodium heart healthy     07/13/17 1029   07/13/17 0000  predniSONE (DELTASONE) 10 MG tablet     07/13/17 1032   07/13/17 0000  amoxicillin-clavulanate (AUGMENTIN) 875-125 MG tablet  2 times daily     07/13/17 1032   07/13/17 0000  For home use only DME oxygen    Comments:  With activity  Question Answer Comment  Mode or (Route) Nasal cannula   Liters per Minute 2   Frequency Continuous (stationary and portable oxygen unit needed)   Oxygen conserving device Yes   Oxygen delivery system Gas      07/13/17 1244     Allergies  Allergen Reactions  . Ciprofloxacin Diarrhea  . Omeprazole Diarrhea   Follow-up Information    [name], MD Follow up in 1 week(s).   Specialty:   Family Medicine Why:  for hospital discharge follow up, please  check your blood pressure at home, bring in blood pressure record for your doctor to reveiw to continue adjust blood pressure meds. please continue to work with your docotor to quit smoking. Contact information: [place] [address] [phone]        Alexis Frock, MD Follow up.   Specialty:  Urology Why:  For 07/18/2017   @9 :45 AM Contact information: [place] [address] [phone]        Conrad Cross, MD Follow up.   Specialties:  Vascular Surgery, Cardiology Contact information: [address] [place] [address] [phone]        Juanito Doom, MD Follow up in 2 week(s).   Specialty:  Pulmonary Disease Why:  for copd management/chronic hypoxic respiratory failure, for perioperative lung evaluation Contact information: [place] [address] [phone]        [facility] Follow up.   Contact information: [address] [place] [address] [facility] Follow up.   Why:  home oxygen Contact information: [address] [place] [address] [phone]            The results of significant diagnostics from this hospitalization (including imaging, microbiology, ancillary and laboratory) are listed below for reference.    Significant Diagnostic Studies: Dg Chest Portable 1 View  Result Date: 07/10/2017 CLINICAL DATA:  Shortness of breath.  On BiPAP. EXAM: PORTABLE CHEST 1 VIEW COMPARISON:  Chest CT Apr 08, 2017 FINDINGS: Cardiomediastinal silhouette is normal, status post median sternotomy for CABG fractured second most cephalad wire. Increased lung volumes with mild chronic interstitial changes. No pleural effusion or focal consolidation. No pneumothorax. Soft tissue planes and included osseous structures are nonsuspicious. IMPRESSION: COPD. Electronically  Signed   By: Elon Alas M.D.   On: 07/10/2017 23:42    Microbiology: Recent Results (from the past 240 hour(s))  Culture, blood (routine x 2) Call MD if unable to obtain prior to antibiotics being given     Status: None (Preliminary result)   Collection Time: 07/11/17  3:56 AM  Result Value Ref Range Status   Specimen Description BLOOD RIGHT FOREARM  Final   Special Requests   Final    BOTTLES DRAWN AEROBIC AND ANAEROBIC Blood Culture adequate volume   Culture NO GROWTH 2 DAYS  Final   Report Status PENDING  Incomplete  Culture, Urine     Status: Abnormal   Collection Time: 07/11/17  4:42 AM  Result Value Ref Range Status   Specimen Description URINE, CLEAN CATCH  Final   Special Requests NONE  Final   Culture >=100,000 COLONIES/mL ENTEROCOCCUS FAECALIS (A)  Final   Report Status 07/13/2017 FINAL  Final   Organism ID, Bacteria ENTEROCOCCUS FAECALIS (A)  Final      Susceptibility   Enterococcus faecalis - MIC*    AMPICILLIN <=2 SENSITIVE Sensitive     LEVOFLOXACIN >=8 RESISTANT Resistant     NITROFURANTOIN <=16 SENSITIVE Sensitive     VANCOMYCIN 1 SENSITIVE Sensitive     * >=100,000 COLONIES/mL ENTEROCOCCUS FAECALIS  MRSA PCR Screening     Status: None   Collection Time: 07/11/17  2:30 PM  Result Value Ref Range Status   MRSA by PCR NEGATIVE NEGATIVE Final    Comment:        The GeneXpert MRSA Assay (FDA approved for NASAL specimens only), is one component of a  comprehensive MRSA colonization surveillance program. It is not intended to diagnose MRSA infection nor to guide or monitor treatment for MRSA infections.   Culture, blood (Routine X 2) w Reflex to ID Panel     Status: None (Preliminary result)   Collection Time: 07/11/17  2:44 PM  Result Value Ref Range Status   Specimen Description BLOOD RIGHT ARM  Final   Special Requests   Final    BOTTLES DRAWN AEROBIC AND ANAEROBIC Blood Culture adequate volume   Culture NO GROWTH 2 DAYS  Final   Report Status  PENDING  Incomplete     Labs: Basic Metabolic Panel:  Recent Labs Lab 07/10/17 2310  NA 138  K 3.7  CL 100*  CO2 25  GLUCOSE 127*  BUN 9  CREATININE 0.73  CALCIUM 8.7*   Liver Function Tests: No results for input(s): AST, ALT, ALKPHOS, BILITOT, PROT, ALBUMIN in the last 168 hours. No results for input(s): LIPASE, AMYLASE in the last 168 hours. No results for input(s): AMMONIA in the last 168 hours. CBC:  Recent Labs Lab 07/10/17 2310 07/12/17 0528 07/13/17 0527  WBC 26.9* 21.8* 15.9*  NEUTROABS 21.0*  --   --   HGB 15.0 12.3* 12.6*  HCT 44.1 38.3* 39.6  MCV 93.6 95.0 97.1  PLT 325 274 312   Cardiac Enzymes:  Recent Labs Lab 07/10/17 2310  TROPONINI <0.03   BNP: BNP (last 3 results)  Recent Labs  08/05/16 1024 07/10/17 2310  BNP 48.3 209.6*    ProBNP (last 3 results) No results for input(s): PROBNP in the last 8760 hours.  CBG: No results for input(s): GLUCAP in the last 168 hours.     SignedFlorencia Reasons MD, PhD  [facility] 07/13/2017, 11:01 PM

## 2017-07-13 NOTE — Progress Notes (Signed)
Anesthesia Chart Review: SAME DAY WORK-UP.  Patient is a aortogram with bilateral pelvic angiogram; possible left internal iliac intervention on 07/14/17 by Dr. Adele Barthel. Case is scheduled in the OR because reportedly patient would not agree to the procedure unless done under general anesthesia. Unfortunately, he was admitted to [facility] 07/10/17-07/13/17 with COPD exacerbation.  ABG on 07/10/17 showed pH 7.304, pO2 205 (100% O2), pCO2 58.4, HCO3 29.0. Started on BiPAP in ED. Given Solu-Medrol, bronchodilators. Pro-calcitonin level low therefore ceftriaxone and doxycycline discontinued. Urine culture showed >=100,000 colonies/ml Enterococcus Faecalis. He was discharged with recommendation for home oxygen.   History includes MI '98, CAD s/p CABG (around age 32), severe COPD, essential tremor, former smoker (quit '12), GERD, HTN, HLD, s/p robotic assisted laparoscopic left pyeloplasty 02/04/13, tonsillectomy, back surgery.   - PCP is Dr. Garret Reddish. - Cardiologist is Dr.James Hochrein, last visit 05-17-2017 to get established. No further CV testing felt indicated at that time.  - Pulmonologist is Dr. Simonne Maffucci. - Urologist is Dr. Alexis Frock.  Meds include albuterol, amlodipine, Augmentin (thru 07/18/17), ASA 81 mg, atenolol, Lipitor, Lotensin, Symbicort, prednisone (started 07/13/17), ProAir, Synthroid, Spiriva Respimat.  EKG 07/11/17: ST at 113, abnormal T, consider ischemia, anterolateral leads. Interpreting physician did not think it was significantly changed from previous tracing in [place] (08/05/16). HR is faster and T wave abnormality is more pronounced when compared to May 17, 2017 tracing from [facility].  Exercise stress echo 01/23/13 ([facility]): Intermediate risk study secondary to poor exercise tolerance with grossly normal stress echo without evidence for inducible ischemia.  Chest CT 04/08/17: IMPRESSION: 1. Lung-RADS 2, benign appearance or behavior. Continue annual screening with low-dose  chest CT without contrast in 12 months. 2. Diffuse bronchial wall thickening with moderate centrilobular and paraseptal emphysema; imaging findings suggestive of underlying COPD. 3. Aortic atherosclerosis, in addition to left main and 3 vessel coronary artery disease. Status post median sternotomy for CABG including LIMA to the LAD.   1V CXR 07/10/17: FINDINGS: Cardiomediastinal silhouette is normal, status post median sternotomy for CABG fractured second most cephalad wire. Increased lung volumes with mild chronic interstitial changes. No pleural effusion or focal consolidation. No pneumothorax. Soft tissue planes and included osseous structures are nonsuspicious. IMPRESSION: COPD.  Spirometry 09/15/16: FVC 2.41 (55%), FEV1 1.02 (31%), FEV1/FVC 42% (57%), FEF25-75% 0.41 (16%). Interpretation: Very severe obstruction, with low vital capacity.  Labs from 07/10/17-07/13/17 reviewed. Last WBC 15.9, H/H 12.6/39.6, PLT 312. Lactic acid 2.3 (done from 3.52). Procalcitonin 1.05. Cr 0.73. Glucose 127. K 3.7. Na 138.  Reviewed recent admission with anesthesiologist Dr. Tobias Alexander. Recommend procedure be postponed at least a few weeks while patient recovers from COPD exacerbation. Later reviewed cardiac history with anesthesiologist Dr. Lissa Hoard. Patient with recent cardiology follow-up without new testing ordered. Last EKG did show tachycardiac but in the setting of acute COPD exacerbation. If no acute CV issues, tachycardia improved would not anticipate need for re-evaluation by cardiology prior to procedure. [facility] RN Zigmund Daniel notified of anesthesiologist recommendations to postpone procedure until recovered from COPD exacerbation.  George Hugh [facility] Short Stay Center/Anesthesiology Phone [phone] 07/13/2017 4:43 PM

## 2017-07-13 NOTE — Telephone Encounter (Signed)
Spoke with pt's wife, states that pt is in ICU at [facility], would like pulmonary to round on him.  I advised pt's wife to make charge nurse aware and she can help to coordinate this.  Pt's wife expressed understanding.  Nothing further needed.

## 2017-07-13 NOTE — Care Management Note (Signed)
Case Management Note  Patient Details  Name: Brian Graham MRN: [id number] Date of Birth: [birth year]  Subjective/Objective:    Pt admitted with COPD exacerbation                Action/Plan:   PTA independent from home with wife.  Pt will need home oxygen - choice given and pt chose [facility] - agency contacted and referral accepted - CM informed that pt will discharge home today.  Pt has PCP and denied barriers to obtaining/paying for prescribed medications.  NO other CM needs identified prior to discharge   Expected Discharge Date:  07/13/17               Expected Discharge Plan:  Home/Self Care  In-House Referral:     Discharge planning Services  CM Consult  Post Acute Care Choice:    Choice offered to:  Patient  DME Arranged:  Oxygen DME Agency:  [place]:    [facility] Agency:     Status of Service:  Completed, signed off  If discussed at [place] of Stay Meetings, dates discussed:    Additional Comments:  Brian Labrador, RN 07/13/2017, 11:24 AM

## 2017-07-13 NOTE — Progress Notes (Signed)
SATURATION QUALIFICATIONS: (This note is used to comply with regulatory documentation for home oxygen)  Patient Saturations on Room Air at Rest = 90% Patient Saturations on Room Air while Ambulation = 83% Patient Saturations on  3  Liters of oxygen while Ambulating = 90%  Please briefly explain why patient needs home oxygen:alternate methods tried and failed, pt requires oxygen.

## 2017-07-13 NOTE — Progress Notes (Signed)
Occupational Therapy Evaluation Patient Details Name: Brian Graham MRN: [id number] DOB: 1950-12-25 Today's Date: 07/13/2017    History of Present Illness 66 year old male with history of COPD not on home oxygen presented with shortness of breath. Patient with acute hypoxic and hypercapnic respiratory failure from COPD exacerbation. Requiring BiPAP on admission.   Clinical Impression   Pt is functioning at a modified independent to independent level in ADL and mobility. Pt's 02 sats noted to drop to 83% with donning socks on RA, requiring 2-3L 02 to maintains saturations in low to mid 90s. Pt educated in pursed lip breathing and energy conservation strategies and reinforced with handouts. No further OT needs.    Follow Up Recommendations  No OT follow up    Equipment Recommendations  None recommended by OT    Recommendations for Other Services       Precautions / Restrictions Precautions Precautions: None Precaution Comments: watch sats Restrictions Weight Bearing Restrictions: No      Mobility Bed Mobility Overal bed mobility: Independent                Transfers Overall transfer level: Independent Equipment used: None                  Balance Overall balance assessment: Needs assistance Sitting-balance support: No upper extremity supported;Feet supported Sitting balance-Leahy Scale: Good     Standing balance support: No upper extremity supported;During functional activity Standing balance-Leahy Scale: Fair Standing balance comment: Pt was able to stand statically wihtout support.                  Standardized Balance Assessment Standardized Balance Assessment : Dynamic Gait Index   Dynamic Gait Index Level Surface: Normal Change in Gait Speed: Normal Gait with Horizontal Head Turns: Normal Gait with Vertical Head Turns: Normal Gait and Pivot Turn: Normal Step Over Obstacle: Normal Step Around Obstacles: Normal Steps: Mild  Impairment Total Score: 23     ADL either performed or assessed with clinical judgement   ADL Overall ADL's : Modified independent                                       General ADL Comments: educated in energy conservations and compensatory strategies and devices for tremor     Vision Baseline Vision/History: Wears glasses Wears Glasses: At all times Patient Visual Report: No change from baseline       Perception     Praxis      Pertinent Vitals/Pain Pain Assessment: No/denies pain     Hand Dominance Right   Extremity/Trunk Assessment Upper Extremity Assessment Upper Extremity Assessment: RUE deficits/detail;LUE deficits/detail RUE Deficits / Details: baseline tremor RUE Coordination: decreased fine motor LUE Deficits / Details: baseline tremor LUE Coordination: decreased fine motor   Lower Extremity Assessment Lower Extremity Assessment: Defer to PT evaluation   Cervical / Trunk Assessment Cervical / Trunk Assessment: Normal   Communication Communication Communication: No difficulties   Cognition Arousal/Alertness: Awake/alert Behavior During Therapy: WFL for tasks assessed/performed Overall Cognitive Status: Within Functional Limits for tasks assessed                                     General Comments      Exercises     Shoulder Instructions      Home Living Family/patient  expects to be discharged to:: Private residence Living Arrangements: Spouse/significant other Available Help at Discharge: Family;Available 24 hours/day Type of Home: House Home Access: Level entry     Home Layout: Two level;Able to live on main level with bedroom/bathroom Alternate Level Stairs-Number of Steps: flight Alternate Level Stairs-Rails: Right Bathroom Shower/Tub: Occupational psychologist: Standard     Home Equipment: None          Prior Functioning/Environment Level of Independence: Independent         Comments: drives and does yardwork        OT Problem List: Cardiopulmonary status limiting activity      OT Treatment/Interventions:      OT Goals(Current goals can be found in the care plan section) Acute Rehab OT Goals Patient Stated Goal: go home  OT Frequency:     Barriers to D/C:            Co-evaluation              AM-PAC PT "6 Clicks" Daily Activity     Outcome Measure Help from another person eating meals?: None Help from another person taking care of personal grooming?: None Help from another person toileting, which includes using toliet, bedpan, or urinal?: None Help from another person bathing (including washing, rinsing, drying)?: None Help from another person to put on and taking off regular upper body clothing?: None Help from another person to put on and taking off regular lower body clothing?: None 6 Click Score: 24   End of Session Equipment Utilized During Treatment: Gait belt;Oxygen Nurse Communication: Other (comment) (02 sats with mobility)  Activity Tolerance: Patient tolerated treatment well Patient left: in chair;with call bell/phone within reach (on 2L o2)  OT Visit Diagnosis: Other (comment) (decreased activity tolerance)                Time: 1008-1020 OT Time Calculation (min): 12 min Charges:  OT General Charges $OT Visit: 1 Procedure OT Evaluation $OT Eval Low Complexity: 1 Procedure G-Codes:     July 31, 2017 Nestor Lewandowsky, OTR/L Pager: [phone]  Werner Lean, Haze Boyden 31-Jul-2017, 10:46 AM

## 2017-07-13 NOTE — Progress Notes (Signed)
Discharged home by wheelchair accompanied by wife, o2 [facility], discharge instructions, prescription given to pt. Belongings taken home

## 2017-07-13 NOTE — Progress Notes (Signed)
SATURATION QUALIFICATIONS: (This note is used to comply with regulatory documentation for home oxygen)  Patient Saturations on Room Air at Rest = 87-90%  Patient Saturations on Room Air while Ambulating = 83-86%  Patient Saturations on 3 Liters of oxygen while Ambulating = >90%  Please briefly explain why patient needs home oxygen: Pt was in need of O2 at rest to keep sats >88%.  Needed 3L with activity to keep sats >90%.  Thanks.  [place] [phone] (pager)

## 2017-07-13 NOTE — Evaluation (Signed)
Physical Therapy Evaluation Patient Details Name: Brian Graham MRN: [id number] DOB: 1951-11-02 Today's Date: 07/13/2017   History of Present Illness  66 year old male with history of COPD not on home oxygen presented with shortness of breath. Patient with acute hypoxic and hypercapnic respiratory failure from COPD exacerbation. Requiring BiPAP on admission.  Past Medical History:  Diagnosis Date  . Arthritis    "left knee" (08/05/2016)  . CAD (coronary artery disease) of artery bypass graft 08/05/2016   Around age 73. CABG - 3 vessel.   . Colon polyp   . COPD, severe ([place]) 08/23/2016   Alpha 1 studies normal per prior pulmonary group notes Smoked 40 years, quit in 2012 June 2016 PFT from prior pulmonary group: "significant obstruction" FEV1 1.58L (47% pred), Residual volume 171% pred, DLCO 59% pred Simple Spirometry>> 09/15/2016 ratio 42% FEV1 1.02 L / 31%   . Essential tremor 06/11/2016   Plans to see Dr. Carles Collet  . Former smoker 09/01/2016   Quit 2012. 40 pack years at least.   . GERD (gastroesophageal reflux disease)   . History of blood transfusion 08/1997   "when he had his heart surgery"  . History of shingles 1970-2013 X 3  . HTN (hypertension) 08/05/2016   Amlodipine 5mg , atenolol 50mg  BID, benazepril 20mg   . Hyperlipemia   . Hypothyroidism   . Lumbar disc disease   . Myocardial infarction [facility]) 08/1997    Clinical Impression  Pt admitted with above diagnosis. Pt currently with functional limitations due to the deficits listed below (see PT Problem List). Pt with good balance. Did need O2 and instruction with pursed lip breathing.  Will follow up if stays in hospital to reinforce breathing techniques and energy conservation.   Pt will benefit from skilled PT to increase their independence and safety with mobility to allow discharge to the venue listed below.    SATURATION QUALIFICATIONS: (This note is used to comply with regulatory documentation for home oxygen)  Patient  Saturations on Room Air at Rest = 87-90%  Patient Saturations on Room Air while Ambulating = 83-86%  Patient Saturations on 3 Liters of oxygen while Ambulating = >90%  Please briefly explain why patient needs home oxygen: Pt was in need of O2 at rest to keep sats >88%.  Needed 3L with activity to keep sats >90%.  Follow Up Recommendations No PT follow up;Supervision - Intermittent    Equipment Recommendations  Other (comment) (home O2)    Recommendations for Other Services       Precautions / Restrictions Precautions Precautions: Fall Restrictions Weight Bearing Restrictions: No      Mobility  Bed Mobility Overal bed mobility: Independent                Transfers Overall transfer level: Independent                  Ambulation/Gait Ambulation/Gait assistance: Supervision Ambulation Distance (Feet): 350 Feet Assistive device: None Gait Pattern/deviations: Step-through pattern;Decreased stride length   Gait velocity interpretation: <1.8 ft/sec, indicative of risk for recurrent falls General Gait Details: Pt was able to ambulate well without LOB but did need O2 to keep sats >90%.  Needed cues to stop and purse lip breathe several times.  Gave handout to pt.   Stairs Stairs: Yes Stairs assistance: Supervision Stair Management: One rail Right;Alternating pattern;Forwards Number of Stairs: 10 General stair comments: No difficulties on steps but did need to practice pursed lip breathing with incr exertion.   Wheelchair Mobility  Modified Rankin (Stroke Patients Only)       Balance Overall balance assessment: Needs assistance Sitting-balance support: No upper extremity supported;Feet supported Sitting balance-Leahy Scale: Good     Standing balance support: No upper extremity supported;During functional activity Standing balance-Leahy Scale: Fair Standing balance comment: Pt was able to stand statically wihtout support.                   Standardized Balance Assessment Standardized Balance Assessment : Dynamic Gait Index   Dynamic Gait Index Level Surface: Normal Change in Gait Speed: Normal Gait with Horizontal Head Turns: Normal Gait with Vertical Head Turns: Normal Gait and Pivot Turn: Normal Step Over Obstacle: Normal Step Around Obstacles: Normal Steps: Mild Impairment Total Score: 23       Pertinent Vitals/Pain Pain Assessment: No/denies pain    Home Living Family/patient expects to be discharged to:: Private residence Living Arrangements: Spouse/significant other Available Help at Discharge: Family;Available 24 hours/day Type of Home: House Home Access: Level entry     Home Layout: Two level;Able to live on main level with bedroom/bathroom;Other (Comment) (man cave on 2nd level) Home Equipment: None      Prior Function Level of Independence: Independent         Comments: drives and does yardwork     Hand Dominance   Dominant Hand: Right    Extremity/Trunk Assessment   Upper Extremity Assessment Upper Extremity Assessment: Defer to OT evaluation    Lower Extremity Assessment Lower Extremity Assessment: Generalized weakness    Cervical / Trunk Assessment Cervical / Trunk Assessment: Normal  Communication      Cognition Arousal/Alertness: Awake/alert Behavior During Therapy: WFL for tasks assessed/performed Overall Cognitive Status: Within Functional Limits for tasks assessed                                        General Comments General comments (skin integrity, edema, etc.): Scored 23/24 on DGI suggesting at low risk of falls.      Exercises     Assessment/Plan    PT Assessment Patient needs continued PT services  PT Problem List Decreased strength;Decreased activity tolerance;Decreased balance;Decreased mobility;Decreased knowledge of use of DME;Decreased safety awareness;Decreased knowledge of precautions;Cardiopulmonary status limiting activity        PT Treatment Interventions DME instruction;Gait training;Stair training;Functional mobility training;Therapeutic activities;Therapeutic exercise;Balance training;Patient/family education    PT Goals (Current goals can be found in the Care Plan section)  Acute Rehab PT Goals Patient Stated Goal: go home PT Goal Formulation: With patient Time For Goal Achievement: 07/20/17 Potential to Achieve Goals: Good    Frequency Min 3X/week   Barriers to discharge        Co-evaluation               AM-PAC PT "6 Clicks" Daily Activity  Outcome Measure Difficulty turning over in bed (including adjusting bedclothes, sheets and blankets)?: None Difficulty moving from lying on back to sitting on the side of the bed? : None Difficulty sitting down on and standing up from a chair with arms (e.g., wheelchair, bedside commode, etc,.)?: None Help needed moving to and from a bed to chair (including a wheelchair)?: None Help needed walking in hospital room?: None Help needed climbing 3-5 steps with a railing? : A Little 6 Click Score: 23    End of Session Equipment Utilized During Treatment: Gait belt;Oxygen Activity Tolerance: Patient limited by fatigue  Patient left: in chair;with call bell/phone within reach Nurse Communication: Mobility status PT Visit Diagnosis: Unsteadiness on feet (R26.81);Muscle weakness (generalized) (M62.81)    Time: 2595-6387 PT Time Calculation (min) (ACUTE ONLY): 14 min   Charges:   PT Evaluation $PT Eval Low Complexity: 1 Low     PT G Codes:        [place] Macaulay Reicher,PT Acute Rehabilitation [phone] (pager)   Denice Paradise 07/13/2017, 10:35 AM

## 2017-07-14 ENCOUNTER — Telehealth: Payer: Self-pay

## 2017-07-14 ENCOUNTER — Ambulatory Visit (HOSPITAL_COMMUNITY): Admission: RE | Admit: 2017-07-14 | Payer: Medicare Other | Source: Ambulatory Visit | Admitting: Vascular Surgery

## 2017-07-14 ENCOUNTER — Encounter (HOSPITAL_COMMUNITY): Admission: RE | Payer: Self-pay | Source: Ambulatory Visit

## 2017-07-14 SURGERY — AORTOGRAM
Anesthesia: Choice

## 2017-07-14 NOTE — Telephone Encounter (Signed)
LMTCB

## 2017-07-15 NOTE — Telephone Encounter (Signed)
Unable to reach patient at time of TCM Call. Left message for patient to return call when available.  

## 2017-07-16 LAB — CULTURE, BLOOD (ROUTINE X 2)
Culture: NO GROWTH
Culture: NO GROWTH
SPECIAL REQUESTS: ADEQUATE
SPECIAL REQUESTS: ADEQUATE

## 2017-07-18 NOTE — Telephone Encounter (Signed)
noted thanks  

## 2017-07-18 NOTE — Telephone Encounter (Signed)
Transition Care Management Follow-up Telephone Call  Per Discharge Summary: Admit date: 07/10/2017 Discharge date: 07/13/2017  Time spent: >108mins, more than 50% time spent on coordination of care, case discussed with vascular surgery Dr Bridgett Larsson over the phone, home o2 arranged  Recommendations for Outpatient Follow-up:  1. F/u with PMD within a week  for hospital discharge follow up, repeat cbc/bmp at follow up 2. F/u with vascular surgery Dr Bridgett Larsson 3. Follow up with pulmonology for copd management and perioperative pulmonary eval if needed 4. F/u with urology Dr Tresa Moore for recurrent uti. 5. Home o2 arranged  --   How have you been since you were released from the hospital? "Better." Patient reports his ear is still bothering him some, but his breathing is much improved.   Do you understand why you were in the hospital? yes   Do you understand the discharge instructions? yes   Where were you discharged to? Home   Items Reviewed:  Medications reviewed: yes  Allergies reviewed: yes  Dietary changes reviewed: no, none made per pt  Referrals reviewed: no, none made   Functional Questionnaire:   Activities of Daily Living (ADLs):   He states they are independent in the following: ambulation, bathing and hygiene, feeding, continence, grooming, toileting and dressing States they require assistance with the following: none   Any transportation issues/concerns?: no   Any patient concerns? no   Confirmed importance and date/time of follow-up visits scheduled yes  Provider Appointment booked with Dr. [name] 07/19/2017 @ 11:30am  Confirmed with patient if condition begins to worsen call PCP or go to the ER.  Patient was given the office number and encouraged to call back with question or concerns.  : yes

## 2017-07-19 ENCOUNTER — Ambulatory Visit (INDEPENDENT_AMBULATORY_CARE_PROVIDER_SITE_OTHER): Payer: Medicare Other | Admitting: Family Medicine

## 2017-07-19 ENCOUNTER — Encounter: Payer: Self-pay | Admitting: Family Medicine

## 2017-07-19 VITALS — BP 138/88 | HR 62 | Temp 98.1°F | Wt 137.0 lb

## 2017-07-19 DIAGNOSIS — J441 Chronic obstructive pulmonary disease with (acute) exacerbation: Secondary | ICD-10-CM

## 2017-07-19 DIAGNOSIS — F419 Anxiety disorder, unspecified: Secondary | ICD-10-CM | POA: Diagnosis not present

## 2017-07-19 DIAGNOSIS — I10 Essential (primary) hypertension: Secondary | ICD-10-CM | POA: Diagnosis not present

## 2017-07-19 DIAGNOSIS — J449 Chronic obstructive pulmonary disease, unspecified: Secondary | ICD-10-CM

## 2017-07-19 DIAGNOSIS — E039 Hypothyroidism, unspecified: Secondary | ICD-10-CM | POA: Diagnosis not present

## 2017-07-19 DIAGNOSIS — E7800 Pure hypercholesterolemia, unspecified: Secondary | ICD-10-CM | POA: Diagnosis not present

## 2017-07-19 DIAGNOSIS — J9601 Acute respiratory failure with hypoxia: Secondary | ICD-10-CM | POA: Diagnosis not present

## 2017-07-19 DIAGNOSIS — I70212 Atherosclerosis of native arteries of extremities with intermittent claudication, left leg: Secondary | ICD-10-CM

## 2017-07-19 MED ORDER — SERTRALINE HCL 25 MG PO TABS: 25.0000 mg | ORAL_TABLET | Freq: Every day | ORAL | 5 refills | Status: DC

## 2017-07-19 NOTE — Patient Instructions (Signed)
Trial low dose anxiety medicine- sertraline 25mg   Glad the breathing is better  You declined labs today- if fatigue or shortness of breath were to worsen please see [place] back   Taking the medicine as directed and not missing any doses is one of the best things you can do to treat your anxiety.  Here are some things to keep in mind:  1) Side effects (stomach upset, some increased anxiety) may happen before you notice a benefit.  These side effects typically go away over time. 2) Changes to your dose of medicine or a change in medication all together is sometimes necessary 3) Most people need to be on medication at least 6 months 4) Many people will notice an improvement within two weeks but the full effect of the medication can take up to 4-6 weeks 5) Stopping the medication when you start feeling better often results in a return of symptoms 6) If you start having thoughts of hurting yourself or others after starting this medicine, call our office immediately at [phone] or seek care through 911.

## 2017-07-19 NOTE — Progress Notes (Signed)
Subjective:  Brian Graham is a 66 y.o. year old very pleasant male patient who presents for transitional care management and hospital follow up for acute respiratory failure due to pneumonia. Patient was hospitalized from 07/10/2017 to 07/13/2017. A TCM phone call was completed on 07/15/17 but unable to contact- follow up call on 07/18/17 completed. Medical complexity moderate .   We had treated patient ion 07/01/17 with augmentin x 7 dyas and prendisone x5 days for early copd exacerbation and also ear infection. His symptoms worsened and he developed chest congestion, worsening cough, burning in chest and he became SOb on morning of 07/10/17- developed DOE, dizziness and EMS called- oxygen was to 88% and placed on cpap, given solumedrol and brochodilators  Initial lactic acid was elevated, respiratory acidosis noted on abg, x-ray withemphysema but no focal opacity. Gentle fluids, magnesium and doxycycline given. On bipap in ER but transioned off. procalcitonin level was low. Chronic respiratory failure at baseline and discharged on 2 L oxygen until he sees pulmonology. Discharged on steroid taper and augmentin- he has finished these. Compliant with spiriva and symbicort still.   Recurrent UTI- to follow up with Dr. Tresa Moore- sent home on augmentin for enterococcus noted in urine. 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 Needs angiogram with Dr. Bridgett Larsson before this surgery for correctoin of 32% blockage (per patient) of left femoral artery.   HTN- controlled on home atenolol and amlodipine and benazepril in hospital and again today. Lipids managed on atorvastatin 80mg .   Hypothyroidism- controlled on home synthroid in hospital and again today  Smoking - no cigarette since he came home 8 days ago!!!  High agitation,anxiety due to breathing issues- trial zoloft was discussed. Also discussed using fan in face. For most part though breathing is  far better- does not feel SOB at baseline and if he does able to use albuterol- he is not using oxygen at present and states has not needed to over last few days-  off for 2-3 days from the oxygen.   See problem oriented charting as well ROS- has returned to baseline shortness of breath, complains of fatigue, no chest pain, no edema   Past Medical History-  Patient Active Problem List   Diagnosis Date Noted  . Atherosclerosis of native arteries of extremity with intermittent claudication ([place]) 07/06/2017    Priority: High  . Weight loss 04/23/2017    Priority: High  . Smoker 09/01/2016    Priority: High  . COPD, severe ([place]) 08/23/2016    Priority: High  . Hx of CABG 08/05/2016    Priority: High  . CAD (coronary artery disease) of artery bypass graft 08/05/2016    Priority: High  . History of adenomatous polyp of colon 04/22/2017    Priority: Medium  . Hyperlipidemia 04/04/2017    Priority: Medium  . Hypothyroidism (acquired) 04/04/2017    Priority: Medium  . Essential hypertension 08/05/2016    Priority: Medium  . Essential tremor 06/11/2016    Priority: Medium  . Aortic atherosclerosis ([place]) 04/13/2017    Priority: Low  . Seasonal allergies 04/04/2017    Priority: Low  . Myalgia 10/04/2016    Priority: Low  . Dysuria 08/23/2016    Priority: Low  . Chronic pain of both knees 08/23/2016    Priority: Low  . Tension headache 06/11/2016    Priority: Low  . Pyelonephritis 04/23/2017    Medications- reviewed and updated  A medical  reconciliation was performed comparing current medicines to hospital discharge medications. Current Outpatient Prescriptions  Medication Sig Dispense Refill  . acetaminophen (TYLENOL) 500 MG tablet Take 1,000 mg by mouth every 6 (six) hours as needed for mild pain.    Marland Kitchen albuterol (PROVENTIL) (2.5 MG/3ML) 0.083% nebulizer solution Take 3 mLs (2.5 mg total) by nebulization every 6 (six) hours as needed for wheezing or shortness of breath. 360 mL  11  . amLODipine (NORVASC) 5 MG tablet Take 1 tablet (5 mg total) by mouth daily. 90 tablet 3  . aspirin 81 MG EC tablet Take 1 tablet (81 mg total) by mouth daily. 120 tablet 3  . atenolol (TENORMIN) 50 MG tablet Take 50 mg by mouth daily.     Marland Kitchen atorvastatin (LIPITOR) 80 MG tablet Take 80 mg by mouth daily at 6 PM.     . benazepril (LOTENSIN) 20 MG tablet Take 20 mg by mouth daily.     . budesonide-formoterol (SYMBICORT) 160-4.5 MCG/ACT inhaler Inhale 2 puffs into the lungs 2 (two) times daily.    Marland Kitchen PROAIR HFA 108 (90 Base) MCG/ACT inhaler Inhale 1-2 puffs into the lungs every 4 (four) hours as needed. (Patient taking differently: Inhale 1-2 puffs into the lungs every 4 (four) hours as needed for wheezing or shortness of breath. ) 3 Inhaler 1  . SYNTHROID 125 MCG tablet Take 125 mcg by mouth daily before breakfast.     . Tiotropium Bromide Monohydrate (SPIRIVA RESPIMAT) 2.5 MCG/ACT AERS Inhale 2 puffs into the lungs daily. 3 Inhaler 3   No current facility-administered medications for this visit.     Objective: BP 138/88 (BP Location: Left Arm, Patient Position: Sitting, Cuff Size: Normal)   Pulse 62   Temp 98.1 F (36.7 C) (Oral)   Wt 137 lb (62.1 kg)   SpO2 95%   BMI 20.23 kg/m  Gen: NAD, resting comfortably CV: RRR no murmurs rubs or gallops Lungs: CTAB no crackles, wheeze, rhonchi Abdomen: soft/nontender/nondistended/thin Ext: no edema Skin: warm, dry Neuro: grossly normal, moves all extremities   Assessment/Plan:  COPD, severe (HCC) Severe COPD with exacerbation leading to hospitalization and acute respiratory failure- patient much improved after supportive care, antibiotics, and steroids. He was oxygen dependent on discharge but has been able to come off without issues. Stress from dealing with breathing issues- we discussed measures such as using fan and using home inhalers- can also use his oxygen- did agree to trial zoloft 25mg  as well for anxiety and may increase at  follow up.   Quit smoking for 8 days- hope he keeps this up!   Atherosclerosis of native arteries of extremity with intermittent claudication (HCC) Upcoming procedure with Dr. Bridgett Larsson- should do this before urological surgery  Acute respiratory failure with hypoxia ([place]) Oxygen requiring previously- now off- will remove from problem list.   Anxiety zoloft trial- went over SSRI risks. Hopefully can avoid with low dose and titration up. In particular discussed SI risks.   HTN- continue atenolol, amlodipine, benazepril. High normal today systolic and diastolic HLD- continue atorvastatin 80mg  Hypothyroidism- controlled on syntrhoid 125 mcg- continue  Hospitalist suggested repeating labs today- patient declines- he feels he has been stuck a great deal and prefers to avoid today. Only had mild anemia in hospital and I agreed we could check this at future date- would also get BMP  Future Appointments Date Time Provider [place]  08/12/2017 2:00 PM Juanito Doom, MD [facility] None   2 month follow up reasonable to recheck  anxiety- with upcoming procedures he wants to hold off on scheduling for now.   Meds ordered this encounter  Medications  . sertraline (ZOLOFT) 25 MG tablet    Sig: Take 1 tablet (25 mg total) by mouth daily.    Dispense:  30 tablet    Refill:  5    Return precautions advised.  Garret Reddish, MD

## 2017-07-24 DIAGNOSIS — F419 Anxiety disorder, unspecified: Secondary | ICD-10-CM | POA: Insufficient documentation

## 2017-07-24 NOTE — Assessment & Plan Note (Signed)
Upcoming procedure with Dr. Bridgett Larsson- should do this before urological surgery

## 2017-07-24 NOTE — Assessment & Plan Note (Signed)
Oxygen requiring previously- now off- will remove from problem list.

## 2017-07-24 NOTE — Assessment & Plan Note (Signed)
zoloft trial- went over SSRI risks. Hopefully can avoid with low dose and titration up. In particular discussed SI risks.

## 2017-07-24 NOTE — Assessment & Plan Note (Addendum)
Severe COPD with exacerbation leading to hospitalization and acute respiratory failure- patient much improved after supportive care, antibiotics, and steroids. He was oxygen dependent on discharge but has been able to come off without issues. Stress from dealing with breathing issues- we discussed measures such as using fan and using home inhalers- can also use his oxygen- did agree to trial zoloft 25mg  as well for anxiety and may increase at follow up.   Quit smoking for 8 days- hope he keeps this up!

## 2017-08-03 ENCOUNTER — Encounter: Payer: Self-pay | Admitting: Family Medicine

## 2017-08-03 ENCOUNTER — Ambulatory Visit (INDEPENDENT_AMBULATORY_CARE_PROVIDER_SITE_OTHER): Payer: Medicare Other | Admitting: Family Medicine

## 2017-08-03 VITALS — BP 152/78 | HR 62 | Temp 98.5°F | Ht 69.0 in | Wt 139.0 lb

## 2017-08-03 DIAGNOSIS — H9201 Otalgia, right ear: Secondary | ICD-10-CM

## 2017-08-03 NOTE — Patient Instructions (Addendum)
flonase OTC x 2-4 week trial- if no improvement, can refer you to ENT  Wrote letter for [employer] - hopefully this works

## 2017-08-03 NOTE — Progress Notes (Signed)
Subjective:  Brian Graham is a 66 y.o. year old very pleasant male patient who presents for/with See problem oriented charting ROS- no difficulty hearing or tinnitus. No jaw pain. No chest pain. Some congestion.    Past Medical History-  Patient Active Problem List   Diagnosis Date Noted  . Atherosclerosis of native arteries of extremity with intermittent claudication ([place]) 07/06/2017    Priority: High  . Weight loss 04/23/2017    Priority: High  . Smoker 09/01/2016    Priority: High  . COPD, severe ([place]) 08/23/2016    Priority: High  . Hx of CABG 08/05/2016    Priority: High  . CAD (coronary artery disease) of artery bypass graft 08/05/2016    Priority: High  . History of adenomatous polyp of colon 04/22/2017    Priority: Medium  . Hyperlipidemia 04/04/2017    Priority: Medium  . Hypothyroidism (acquired) 04/04/2017    Priority: Medium  . Essential hypertension 08/05/2016    Priority: Medium  . Essential tremor 06/11/2016    Priority: Medium  . Aortic atherosclerosis ([place]) 04/13/2017    Priority: Low  . Seasonal allergies 04/04/2017    Priority: Low  . Myalgia 10/04/2016    Priority: Low  . Dysuria 08/23/2016    Priority: Low  . Chronic pain of both knees 08/23/2016    Priority: Low  . Tension headache 06/11/2016    Priority: Low  . Anxiety 07/24/2017  . Pyelonephritis 04/23/2017    Medications- reviewed and updated Current Outpatient Prescriptions  Medication Sig Dispense Refill  . acetaminophen (TYLENOL) 500 MG tablet Take 1,000 mg by mouth every 6 (six) hours as needed for mild pain.    Marland Kitchen albuterol (PROVENTIL) (2.5 MG/3ML) 0.083% nebulizer solution Take 3 mLs (2.5 mg total) by nebulization every 6 (six) hours as needed for wheezing or shortness of breath. 360 mL 11  . amLODipine (NORVASC) 5 MG tablet Take 1 tablet (5 mg total) by mouth daily. 90 tablet 3  . aspirin 81 MG EC tablet Take 1 tablet (81 mg total) by mouth daily. 120 tablet 3  . atenolol  (TENORMIN) 50 MG tablet Take 50 mg by mouth daily.     Marland Kitchen atorvastatin (LIPITOR) 80 MG tablet Take 80 mg by mouth daily at 6 PM.     . benazepril (LOTENSIN) 20 MG tablet Take 20 mg by mouth daily.     . budesonide-formoterol (SYMBICORT) 160-4.5 MCG/ACT inhaler Inhale 2 puffs into the lungs 2 (two) times daily.    Marland Kitchen PROAIR HFA 108 (90 Base) MCG/ACT inhaler Inhale 1-2 puffs into the lungs every 4 (four) hours as needed. (Patient taking differently: Inhale 1-2 puffs into the lungs every 4 (four) hours as needed for wheezing or shortness of breath. ) 3 Inhaler 1  . sertraline (ZOLOFT) 25 MG tablet Take 1 tablet (25 mg total) by mouth daily. 30 tablet 5  . SYNTHROID 125 MCG tablet Take 125 mcg by mouth daily before breakfast.     . Tiotropium Bromide Monohydrate (SPIRIVA RESPIMAT) 2.5 MCG/ACT AERS Inhale 2 puffs into the lungs daily. 3 Inhaler 3   No current facility-administered medications for this visit.     Objective: BP (!) 152/78 (BP Location: Left Arm, Patient Position: Sitting, Cuff Size: Normal)   Pulse 62   Temp 98.5 F (36.9 C) (Oral)   Ht 5\' 9"  (1.753 m)   Wt 139 lb (63 kg)   SpO2 95%   BMI 20.53 kg/m  Gen: NAD, resting comfortably Minimal nare  discharge. TM normal on left. On right very mild erythema of TM but improved from prior exam- no obvious air/fluid levels.  CV: RRR no murmurs rubs or gallops Lungs: CTAB no crackles, wheeze, rhonchi Ext: no edema Skin: warm, dry  Assessment/Plan:  Right ear pain S: 2-3/10 lingering pain after treatment for R ear infection over a month ago. No discharge from the ear other than piece of waxwith q tip and noted some blood. No decreased hearing. Feels sticky when pulls ear down and gets clicking sound. Mild congestion in throat/clearing of throat but does not feel ill- no shortness of breath.  A/P: No clear cause on exam- very mild erythema but does not appear to be otitis media. Possible effusion that is difficult to fully visualize.  Discussed ENT referral but with upcoming kidney removal he denies -flonase OTC x 2-4 week trial- if no improvement, can refer you to ENT Patient Instructions  flonase OTC x 2-4 week trial- if no improvement, can refer you to ENT  Wrote letter for [place] - hopefully this works  Future Appointments Date Time Provider [place]  08/12/2017 2:00 PM Juanito Doom, MD [facility] None   The duration of face-to-face time during this visit was greater than 15 minutes. Greater than 50% of this time was spent in counseling, explanation of diagnosis, planning of further management, and/or coordination of care including discussing stress of potentially having jury duty around time of his surgery- which is very important to him.   Return precautions advised.  Garret Reddish, MD

## 2017-08-04 ENCOUNTER — Encounter: Payer: Self-pay | Admitting: Vascular Surgery

## 2017-08-08 ENCOUNTER — Encounter (HOSPITAL_COMMUNITY): Payer: Self-pay | Admitting: Vascular Surgery

## 2017-08-08 NOTE — Progress Notes (Addendum)
Anesthesia follow-up: See my note from 07/13/17. Patient was scheduled for aortogram with bilateral pelvic angiogram, possible left internal iliac intervention by Dr. Adele Barthel on 07/14/17. However, procedure was delayed due to recent admission for COPD exacerbation. Procedure has now been rescheduled for 08/15/17.  Since 07/13/17, patient has been re-evaluated by his PCP Dr. Garret Reddish on 08/04/17.  He has medically cleared as long as pulmonology and cardiology have cleared as well. Pulmonologist Dr. Lake Bells did sign a note of pulmonary clearance (see Media tab). [facility] also reached out to Dr. Percival Spanish who wrote on 08/12/17, "I see no contraindication to angiogram.  He would likely need further evaluation if he is to have open vascular surgery."  He also saw Dr. Lake Bells this afternoon for routine pulmonary follow-up. He wrote, Muaad had an exacerbation of his COPD requiring a hospitalization earlier this year. Since then he's done better. He remains compliant with Symbicort and Spiriva. He was discharged on oxygen. I don't think he needs this right now but given his upcoming surgeries I'm going to have him keep it at home for now. It would not be unexpected for an individual with COPD of his severity to need oxygen transiently after some surgeries.Marland KitchenMarland KitchenYou can proceed with surgery, use Spiriva and Symbicort on the day of surgery, get out of bed as soon as possible."  Labs from 08/11/17 noted.   EKG 08/11/17: SB at 55 bpm.   If no acute changes or new cardiopulmonary issues then I would anticipate that patient can proceed with percutaneous vascular procedure. Anesthesiologist to evaluate on the day of surgery.   George Hugh [facility] Stay [facility]/Anesthesiology Phone [phone] 08/12/2017 2:46 PM

## 2017-08-10 ENCOUNTER — Other Ambulatory Visit: Payer: Self-pay | Admitting: *Deleted

## 2017-08-10 NOTE — Pre-Procedure Instructions (Signed)
[facility]  08/10/2017      [facility] - [name], [place] [address] Phone: [phone] Fax: [phone]  [id number] [address], [place] [address] Phone: [phone] Fax: [phone]    Your procedure is scheduled on   Monday  08/15/17  Report to [facility] at 530 A.M.  Call this number if you have problems the morning of surgery:  [phone]   Remember:  Do not eat food or drink liquids after midnight.   Take these medicines the morning of surgery with A SIP OF WATER   TYLENOL IF NEEDED, ALBUTEROL, AMLODIPINE (NORVASC), ATENOLOL (TENORMIN), SYMBICORT INHALER, PROAIR INHALER IF NEEDED, SERTRALINE (ZOLOFT) ,SYNTHROID ,TAMSULOSIN IF NEEDED, SPIRIVA   Do not wear jewelry, make-up or nail polish.  Do not wear lotions, powders, or perfumes, or deoderant.  Do not shave 48 hours prior to surgery.  Men may shave face and neck.  Do not bring valuables to the hospital.  [facility] is not responsible for any belongings or valuables.  Contacts, dentures or bridgework may not be worn into surgery.  Leave your suitcase in the car.  After surgery it may be brought to your room.  For patients admitted to the hospital, discharge time will be determined by your treatment team.  Patients discharged the day of surgery will not be allowed to drive home.   Name and phone number of your driver:    Special instructions:  [place] - Preparing for Surgery  Before surgery, you can play an important role.  Because skin is not sterile, your skin needs to be as free of germs as possible.  You can reduce the number of germs on you skin by washing with CHG (chlorahexidine gluconate) soap before surgery.  CHG is an antiseptic cleaner which kills germs and bonds with the skin to continue killing germs even after washing.  Please DO NOT use  if you have an allergy to CHG or antibacterial soaps.  If your skin becomes reddened/irritated stop using the CHG and inform your nurse when you arrive at Short Stay.  Do not shave (including legs and underarms) for at least 48 hours prior to the first CHG shower.  You may shave your face.  Please follow these instructions carefully:   1.  Shower with CHG Soap the night before surgery and the                                morning of Surgery.  2.  If you choose to wash your hair, wash your hair first as usual with your       normal shampoo.  3.  After you shampoo, rinse your hair and body thoroughly to remove the                      Shampoo.  4.  Use CHG as you would any other liquid soap.  You can apply chg directly       to the skin and wash gently with scrungie or a clean washcloth.  5.  Apply the CHG Soap to your body ONLY FROM THE NECK DOWN.        Do not use on open wounds or open sores.  Avoid contact with your eyes,  ears, mouth and genitals (private parts).  Wash genitals (private parts)       with your normal soap.  6.  Wash thoroughly, paying special attention to the area where your surgery        will be performed.  7.  Thoroughly rinse your body with warm water from the neck down.  8.  DO NOT shower/wash with your normal soap after using and rinsing off       the CHG Soap.  9.  Pat yourself dry with a clean towel.            10.  Wear clean pajamas.            11.  Place clean sheets on your bed the night of your first shower and do not        sleep with pets.  Day of Surgery  Do not apply any lotions/deoderants the morning of surgery.  Please wear clean clothes to the hospital/surgery center.    Please read over the following fact sheets that you were given. Pain Booklet, Coughing and Deep Breathing, MRSA Information and Surgical Site Infection Prevention

## 2017-08-11 ENCOUNTER — Encounter (HOSPITAL_COMMUNITY)
Admission: RE | Admit: 2017-08-11 | Discharge: 2017-08-11 | Disposition: A | Discharge status: Home / Self Care | Payer: Medicare Other | Source: Ambulatory Visit | Attending: Vascular Surgery | Admitting: Vascular Surgery

## 2017-08-11 ENCOUNTER — Encounter (HOSPITAL_COMMUNITY): Payer: Self-pay

## 2017-08-11 DIAGNOSIS — Z01810 Encounter for preprocedural cardiovascular examination: Secondary | ICD-10-CM | POA: Diagnosis present

## 2017-08-11 DIAGNOSIS — Z01812 Encounter for preprocedural laboratory examination: Secondary | ICD-10-CM | POA: Diagnosis not present

## 2017-08-11 HISTORY — DX: Other skin changes: R23.8

## 2017-08-11 HISTORY — DX: Anxiety disorder, unspecified: F41.9

## 2017-08-11 HISTORY — DX: Nausea with vomiting, unspecified: R11.2

## 2017-08-11 HISTORY — DX: Other specified postprocedural states: Z98.890

## 2017-08-11 HISTORY — DX: Peripheral vascular disease, unspecified: I73.9

## 2017-08-11 HISTORY — DX: Spontaneous ecchymoses: R23.3

## 2017-08-11 HISTORY — DX: Chronic kidney disease, unspecified: N18.9

## 2017-08-11 HISTORY — DX: Presence of spectacles and contact lenses: Z97.3

## 2017-08-11 LAB — CBC
HCT: 43.3 % (ref 39.0–52.0)
HEMOGLOBIN: 14.4 g/dL (ref 13.0–17.0)
MCH: 31.1 pg (ref 26.0–34.0)
MCHC: 33.3 g/dL (ref 30.0–36.0)
MCV: 93.5 fL (ref 78.0–100.0)
PLATELETS: 246 10*3/uL (ref 150–400)
RBC: 4.63 MIL/uL (ref 4.22–5.81)
RDW: 14.2 % (ref 11.5–15.5)
WBC: 6.3 10*3/uL (ref 4.0–10.5)

## 2017-08-11 LAB — COMPREHENSIVE METABOLIC PANEL
ALK PHOS: 64 U/L (ref 38–126)
ALT: 20 U/L (ref 17–63)
ANION GAP: 9 (ref 5–15)
AST: 22 U/L (ref 15–41)
Albumin: 3.5 g/dL (ref 3.5–5.0)
BILIRUBIN TOTAL: 0.7 mg/dL (ref 0.3–1.2)
BUN: 11 mg/dL (ref 6–20)
CALCIUM: 8.8 mg/dL — AB (ref 8.9–10.3)
CO2: 27 mmol/L (ref 22–32)
CREATININE: 0.76 mg/dL (ref 0.61–1.24)
Chloride: 103 mmol/L (ref 101–111)
GFR calc non Af Amer: >60 mL/min (ref >60)
Glucose, Bld: 85 mg/dL (ref 65–99)
Potassium: 4.1 mmol/L (ref 3.5–5.1)
Sodium: 139 mmol/L (ref 135–145)
TOTAL PROTEIN: 6.5 g/dL (ref 6.5–8.1)

## 2017-08-11 LAB — SURGICAL PCR SCREEN
MRSA, PCR: NEGATIVE
Staphylococcus aureus: NEGATIVE

## 2017-08-11 NOTE — Progress Notes (Signed)
Pt denies any acute cardiopulmonary issues. Pt under the care of Dr. Percival Spanish, Cardiology. Ebony Hail, PA, Anesthesia advised that EKG be repeated. Pt chart forwarded to anesthesia to review pending cardiac clearance note Zigmund Daniel at [facility] to fax).

## 2017-08-11 NOTE — Pre-Procedure Instructions (Signed)
Brian Graham  08/11/2017      [facility] - [place], [place] [address] Phone: [phone] Fax: [phone]  [id number] [place], [place] [address] Phone: [phone] Fax: [phone]    Your procedure is scheduled on Monday, August 15, 2017  Report to [facility] at 5:30 A.M.  Call this number if you have problems the morning of surgery:  [phone]   Remember:  Do not eat food or drink liquids after midnight Sunday, August 14, 2017  Take these medicines the morning of surgery with A SIP OF WATER : aspirin,  amLODipine (NORVASC),  atenolol (TENORMIN),  sertraline (ZOLOFT)), SYNTHROID,  Tiotropium Bromide Monohydrate (SPIRIVA RESPIMAT),  budesonide-formoterol (SYMBICORT)  Inhaler, if needed: acetaminophen (TYLENOL) for pain,  tamsulosin (FLOMAX) for urinary issues, albuterol (PROVENTIL)  nebulizer solution for wheezing or shortness of breath,  PROAIR HFA  Inhaler for wheezing or shortness of breath Stop taking vitamins, fish oil and herbal medications. Do not take any NSAIDs ie: Ibuprofen, Advil, Naproxen (Aleve), Motrin, BC and Goody Powder; stop now.   Do not wear jewelry, make-up or nail polish.  Do not wear lotions, powders, or perfumes, or deoderant.  Do not shave 48 hours prior to surgery.  Men may shave face and neck.  Do not bring valuables to the hospital.  [facility] is not responsible for any belongings or valuables.  Contacts, dentures or bridgework may not be worn into surgery.  Leave your suitcase in the car.  After surgery it may be brought to your room. For patients admitted to the hospital, discharge time will be determined by your treatment team. Patients discharged the day of surgery will not be allowed to drive home.  Special instructions:   - Preparing for Surgery  Before  surgery, you can play an important role.  Because skin is not sterile, your skin needs to be as free of germs as possible.  You can reduce the number of germs on you skin by washing with CHG (chlorahexidine gluconate) soap before surgery.  CHG is an antiseptic cleaner which kills germs and bonds with the skin to continue killing germs even after washing.  Please DO NOT use if you have an allergy to CHG or antibacterial soaps.  If your skin becomes reddened/irritated stop using the CHG and inform your nurse when you arrive at Short Stay.  Do not shave (including legs and underarms) for at least 48 hours prior to the first CHG shower.  You may shave your face.  Please follow these instructions carefully:   1.  Shower with CHG Soap the night before surgery and the morning of Surgery.  2.  If you choose to wash your hair, wash your hair first as usual with your normal shampoo.  3.  After you shampoo, rinse your hair and body thoroughly to remove the Shampoo.  4.  Use CHG as you would any other liquid soap.  You can apply chg directly  to the skin and wash gently with scrungie or a clean washcloth.  5.  Apply the CHG Soap to your body ONLY FROM THE NECK DOWN.  Do not use on open wounds or open sores.  Avoid contact with your eyes, ears, mouth and genitals (private parts).  Wash genitals (private parts) with your normal soap.  6.  Wash thoroughly, paying special attention  to the area where your surgery will be performed.  7.  Thoroughly rinse your body with warm water from the neck down.  8.  DO NOT shower/wash with your normal soap after using and rinsing off the CHG Soap.  9.  Pat yourself dry with a clean towel.            10.  Wear clean pajamas.            11.  Place clean sheets on your bed the night of your first shower and do not sleep with pets.  Day of Surgery  Do not apply any lotions/deoderants the morning of surgery.  Please wear clean clothes to the hospital/surgery [facility].  Please  read over the following fact sheets that you were given. Pain Booklet, Coughing and Deep Breathing, MRSA Information and Surgical Site Infection Prevention

## 2017-08-12 ENCOUNTER — Telehealth: Payer: Self-pay | Admitting: Cardiology

## 2017-08-12 ENCOUNTER — Ambulatory Visit (INDEPENDENT_AMBULATORY_CARE_PROVIDER_SITE_OTHER): Payer: Medicare Other | Admitting: Pulmonary Disease

## 2017-08-12 ENCOUNTER — Encounter: Payer: Self-pay | Admitting: Pulmonary Disease

## 2017-08-12 VITALS — BP 132/74 | HR 68 | Ht 69.0 in | Wt 139.4 lb

## 2017-08-12 DIAGNOSIS — J449 Chronic obstructive pulmonary disease, unspecified: Secondary | ICD-10-CM | POA: Diagnosis not present

## 2017-08-12 DIAGNOSIS — F17210 Nicotine dependence, cigarettes, uncomplicated: Secondary | ICD-10-CM | POA: Diagnosis not present

## 2017-08-12 DIAGNOSIS — J301 Allergic rhinitis due to pollen: Secondary | ICD-10-CM | POA: Diagnosis not present

## 2017-08-12 DIAGNOSIS — J9611 Chronic respiratory failure with hypoxia: Secondary | ICD-10-CM

## 2017-08-12 NOTE — Progress Notes (Signed)
Subjective:    Patient ID: Brian [facility], male    DOB: [birth year], 66 y.o.   MRN: [id number]  Synopsis: First referred in 2017 for severe COPD after being followed by the pulmonary group in [place] before moving to [place]. Smoked 40 years, quit in 2012   HPI Chief Complaint  Patient presents with  . Follow-up    pt doing well, no complaints at this time.    Brian Graham had a severe ear infection a few months back and he needed some antibiotics.  However he panicked and developed some severe breathing difficulty.  He was admitted to [facility] for a COPD exacerbation, d/c on O2.  He hasn't needed the oxygen. He feels really good now.  Rare mucus in his throat related to sinus drip.  He has been doing yard work.     Past Medical History:  Diagnosis Date  . Anxiety   . Arthritis    "left knee" (08/05/2016)  . Bruises easily   . CAD (coronary artery disease) of artery bypass graft 08/05/2016   Around age 89. CABG - 3 vessel.   . Chronic kidney disease   . Colon polyp   . COPD, severe ([place]) 08/23/2016   Alpha 1 studies normal per prior pulmonary group notes Smoked 40 years, quit in 2012 June 2016 PFT from prior pulmonary group: "significant obstruction" FEV1 1.58L (47% pred), Residual volume 171% pred, DLCO 59% pred Simple Spirometry>> 09/15/2016 ratio 42% FEV1 1.02 L / 31%   . Essential tremor 06/11/2016   Plans to see Dr. Carles Collet  . Former smoker 09/01/2016   Quit 2012. 40 pack years at least.   . GERD (gastroesophageal reflux disease)   . Headache   . History of blood transfusion 08/1997   "when he had his heart surgery"  . History of shingles 1970-2013 X 3  . HTN (hypertension) 08/05/2016   Amlodipine 5mg , atenolol 50mg  BID, benazepril 20mg   . Hyperlipemia   . Hypothyroidism   . Lumbar disc disease   . Myocardial infarction ([place]) 08/1997  . Peripheral vascular disease ([place])   . PONV (postoperative nausea and vomiting)   . Wears glasses       Review of Systems    Constitutional: Positive for fatigue. Negative for chills and fever.  HENT: Negative for nosebleeds, postnasal drip and rhinorrhea.   Respiratory: Positive for shortness of breath. Negative for cough and wheezing.   Cardiovascular: Negative for chest pain, palpitations and leg swelling.       Objective:   Physical Exam  Vitals:   08/12/17 1358  BP: 132/74  Pulse: 68  SpO2: 95%  Weight: 139 lb 6.4 oz (63.2 kg)  Height: 5\' 9"  (1.753 m)    Gen: well appearing HENT: OP clear, TM's clear, neck supple PULM: CTA B, normal percussion CV: RRR, no mgr, trace edema GI: BS+, soft, nontender Derm: no cyanosis or rash Psyche: normal mood and affect   Hospital records from 06/2017 reviewed: COPD exacerbation, treated with steroids, antibiotics; followed up with Dr. [name] afterwards  CBC    Component Value Date/Time   WBC 6.3 08/11/2017 1044   RBC 4.63 08/11/2017 1044   HGB 14.4 08/11/2017 1044   HCT 43.3 08/11/2017 1044   PLT 246 08/11/2017 1044   MCV 93.5 08/11/2017 1044   MCH 31.1 08/11/2017 1044   MCHC 33.3 08/11/2017 1044   RDW 14.2 08/11/2017 1044   LYMPHSABS 3.5 07/10/2017 2310   MONOABS 2.4 (H) 07/10/2017 2310   EOSABS  0.0 07/10/2017 2310   BASOSABS 0.0 07/10/2017 2310   Labss: Alpha 1 studies normal per prior pulmonary group notes 2014 M-M  Chest imaging: August 2018 chest x-ray images independently reviewed showing emphysema no infiltrate  PFT: June 2016 PFT from prior pulmonary group: "significant obstruction" FEV1 1.58L (47% pred), Residual volume 171% pred, DLCO 59% pred Simple Spirometry>> 09/15/2016 ratio 42% FEV1 1.02 L / 31%     Assessment & Plan:   Cigarette smoker  COPD, severe (HCC)  Chronic respiratory failure with hypoxia (HCC)  Seasonal allergic rhinitis due to pollen  Discussion: [name] had an exacerbation of his COPD requiring a hospitalization earlier this year. Since then he's done better. He remains compliant with Symbicort and  Spiriva. He was discharged on oxygen. I don't think he needs this right now but given his upcoming surgeries I'm going to have him keep it at home for now. It would not be unexpected for an individual with COPD of his severity to need oxygen transiently after some surgeries.  He will get a flu shot after surgery.  Plan: Tobacco use: Stay away from cigarettes  COPD: Get a flu shot after surgery next week Continue Spiriva Continue Symbicort You can proceed with surgery, use Spiriva and Symbicort on the day of surgery, get out of bed as soon as possible.  For allergic rhinitis: Use Flonase over-the-counter, continue Zyrtec  For chronic respiratory failure with hypoxemia: Continue O2 with exertion for now, we will revisit this later this year.  Follow up in 3-4 months or sooner if needed    Current Outpatient Prescriptions:  .  acetaminophen (TYLENOL) 500 MG tablet, Take 1,000 mg by mouth every 6 (six) hours as needed for mild pain., Disp: , Rfl:  .  albuterol (PROVENTIL) (2.5 MG/3ML) 0.083% nebulizer solution, Take 3 mLs (2.5 mg total) by nebulization every 6 (six) hours as needed for wheezing or shortness of breath., Disp: 360 mL, Rfl: 11 .  amLODipine (NORVASC) 5 MG tablet, Take 1 tablet (5 mg total) by mouth daily., Disp: 90 tablet, Rfl: 3 .  aspirin 81 MG EC tablet, Take 1 tablet (81 mg total) by mouth daily., Disp: 120 tablet, Rfl: 3 .  atenolol (TENORMIN) 50 MG tablet, Take 50 mg by mouth daily. , Disp: , Rfl:  .  atorvastatin (LIPITOR) 80 MG tablet, Take 80 mg by mouth every evening. , Disp: , Rfl:  .  benazepril (LOTENSIN) 20 MG tablet, Take 20 mg by mouth daily. , Disp: , Rfl:  .  budesonide-formoterol (SYMBICORT) 160-4.5 MCG/ACT inhaler, Inhale 2 puffs into the lungs 2 (two) times daily., Disp: , Rfl:  .  nicotine (NICODERM CQ - DOSED IN MG/24 HOURS) 14 mg/24hr patch, Place 14 mg onto the skin daily., Disp: , Rfl:  .  PROAIR HFA 108 (90 Base) MCG/ACT inhaler, Inhale 1-2 puffs  into the lungs every 4 (four) hours as needed. (Patient taking differently: Inhale 1-2 puffs into the lungs every 4 (four) hours as needed for wheezing or shortness of breath. ), Disp: 3 Inhaler, Rfl: 1 .  sertraline (ZOLOFT) 25 MG tablet, Take 1 tablet (25 mg total) by mouth daily., Disp: 30 tablet, Rfl: 5 .  SYNTHROID 125 MCG tablet, Take 125 mcg by mouth daily before breakfast. , Disp: , Rfl:  .  tamsulosin (FLOMAX) 0.4 MG CAPS capsule, Take 0.4 mg by mouth daily as needed (urinary issues)., Disp: , Rfl:  .  Tiotropium Bromide Monohydrate (SPIRIVA RESPIMAT) 2.5 MCG/ACT AERS, Inhale 2 puffs into  the lungs daily., Disp: 3 Inhaler, Rfl: 3

## 2017-08-12 NOTE — Telephone Encounter (Signed)
I see no contraindication to angiogram.  He would likely need further evaluation if he is to have open vascular surgery.

## 2017-08-12 NOTE — Telephone Encounter (Signed)
New message    Becky from [facility] is calling asking about clearance that was faxed over on the 19th.      [facility] Pre-operative Risk Assessment    Request for surgical clearance:  1. What type of surgery is being performed? Bilateral pelvic angiogram   2. When is this surgery scheduled? Monday 08/15/17  3. Are there any medications that need to be held prior to surgery and how long? She isn't sure, they just need clearance for him to have surgery.   4. Name of physician performing surgery? Dr. Murray Hodgkins   5. What is your office phone and fax number? [place] 08/12/2017, 10:20 AM  _________________________________________________________________   (provider comments below)

## 2017-08-12 NOTE — Patient Instructions (Signed)
Tobacco use: Stay away from cigarettes  COPD: Get a flu shot after surgery next week Continue Spiriva Continue Symbicort You can proceed with surgery, use Spiriva and Symbicort on the day of surgery, get out of bed as soon as possible.  For allergic rhinitis: Use Flonase over-the-counter, continue Zyrtec  For chronic respiratory failure with hypoxemia: Continue O2 with exertion for now, we will revisit this later this year.  Follow up in 3-4 months or sooner if needed

## 2017-08-12 NOTE — Telephone Encounter (Signed)
Becky from [facility] needs the clearance today for surgery on Monday. Thank you.

## 2017-08-12 NOTE — Telephone Encounter (Signed)
Clearance faxed via epic

## 2017-08-13 ENCOUNTER — Emergency Department: Payer: Medicare Other

## 2017-08-13 ENCOUNTER — Encounter: Payer: Self-pay | Admitting: Emergency Medicine

## 2017-08-13 ENCOUNTER — Emergency Department
Admission: EM | Admit: 2017-08-13 | Discharge: 2017-08-13 | Disposition: A | Discharge status: Home / Self Care | Payer: Medicare Other | Attending: Emergency Medicine | Admitting: Emergency Medicine

## 2017-08-13 DIAGNOSIS — J449 Chronic obstructive pulmonary disease, unspecified: Secondary | ICD-10-CM | POA: Diagnosis not present

## 2017-08-13 DIAGNOSIS — I129 Hypertensive chronic kidney disease with stage 1 through stage 4 chronic kidney disease, or unspecified chronic kidney disease: Secondary | ICD-10-CM | POA: Diagnosis not present

## 2017-08-13 DIAGNOSIS — Z79899 Other long term (current) drug therapy: Secondary | ICD-10-CM | POA: Insufficient documentation

## 2017-08-13 DIAGNOSIS — N390 Urinary tract infection, site not specified: Secondary | ICD-10-CM | POA: Insufficient documentation

## 2017-08-13 DIAGNOSIS — I2510 Atherosclerotic heart disease of native coronary artery without angina pectoris: Secondary | ICD-10-CM | POA: Insufficient documentation

## 2017-08-13 DIAGNOSIS — N1330 Unspecified hydronephrosis: Secondary | ICD-10-CM | POA: Insufficient documentation

## 2017-08-13 DIAGNOSIS — E039 Hypothyroidism, unspecified: Secondary | ICD-10-CM | POA: Insufficient documentation

## 2017-08-13 DIAGNOSIS — R109 Unspecified abdominal pain: Secondary | ICD-10-CM | POA: Diagnosis present

## 2017-08-13 DIAGNOSIS — Z951 Presence of aortocoronary bypass graft: Secondary | ICD-10-CM | POA: Diagnosis not present

## 2017-08-13 DIAGNOSIS — Z87891 Personal history of nicotine dependence: Secondary | ICD-10-CM | POA: Insufficient documentation

## 2017-08-13 DIAGNOSIS — Z7982 Long term (current) use of aspirin: Secondary | ICD-10-CM | POA: Insufficient documentation

## 2017-08-13 DIAGNOSIS — N189 Chronic kidney disease, unspecified: Secondary | ICD-10-CM | POA: Diagnosis not present

## 2017-08-13 LAB — URINALYSIS, ROUTINE W REFLEX MICROSCOPIC
Bilirubin Urine: NEGATIVE
GLUCOSE, UA: NEGATIVE mg/dL
HGB URINE DIPSTICK: NEGATIVE
Ketones, ur: NEGATIVE mg/dL
NITRITE: NEGATIVE
PH: 5 (ref 5.0–8.0)
PROTEIN: NEGATIVE mg/dL
Specific Gravity, Urine: 1.013 (ref 1.005–1.030)
Squamous Epithelial / LPF: NONE SEEN

## 2017-08-13 LAB — COMPREHENSIVE METABOLIC PANEL
ALT: 20 U/L (ref 17–63)
ANION GAP: 9 (ref 5–15)
AST: 24 U/L (ref 15–41)
Albumin: 4 g/dL (ref 3.5–5.0)
Alkaline Phosphatase: 75 U/L (ref 38–126)
BUN: 19 mg/dL (ref 6–20)
CALCIUM: 8.9 mg/dL (ref 8.9–10.3)
CO2: 27 mmol/L (ref 22–32)
Chloride: 102 mmol/L (ref 101–111)
Creatinine, Ser: 0.8 mg/dL (ref 0.61–1.24)
Glucose, Bld: 106 mg/dL — ABNORMAL HIGH (ref 65–99)
Potassium: 3.4 mmol/L — ABNORMAL LOW (ref 3.5–5.1)
Sodium: 138 mmol/L (ref 135–145)
TOTAL PROTEIN: 7.2 g/dL (ref 6.5–8.1)
Total Bilirubin: 0.8 mg/dL (ref 0.3–1.2)

## 2017-08-13 LAB — CBC WITH DIFFERENTIAL/PLATELET
Basophils Absolute: 0.1 10*3/uL (ref 0–0.1)
Basophils Relative: 1 %
EOS PCT: 1 %
Eosinophils Absolute: 0.2 10*3/uL (ref 0–0.7)
HEMATOCRIT: 43 % (ref 40.0–52.0)
Hemoglobin: 14.6 g/dL (ref 13.0–18.0)
LYMPHS ABS: 2.2 10*3/uL (ref 1.0–3.6)
LYMPHS PCT: 21 %
MCH: 31.8 pg (ref 26.0–34.0)
MCHC: 33.9 g/dL (ref 32.0–36.0)
MCV: 93.8 fL (ref 80.0–100.0)
MONO ABS: 1.3 10*3/uL — AB (ref 0.2–1.0)
MONOS PCT: 13 %
NEUTROS ABS: 6.7 10*3/uL — AB (ref 1.4–6.5)
Neutrophils Relative %: 64 %
PLATELETS: 255 10*3/uL (ref 150–440)
RBC: 4.59 MIL/uL (ref 4.40–5.90)
RDW: 14.1 % (ref 11.5–14.5)
WBC: 10.5 10*3/uL (ref 3.8–10.6)

## 2017-08-13 LAB — LACTIC ACID, PLASMA: LACTIC ACID, VENOUS: 1.9 mmol/L (ref 0.5–1.9)

## 2017-08-13 LAB — LIPASE, BLOOD: LIPASE: 28 U/L (ref 11–51)

## 2017-08-13 MED ORDER — HYDROMORPHONE HCL 1 MG/ML IJ SOLN
1.0000 mg | INTRAMUSCULAR | Status: AC
  Administered 2017-08-13: 1 mg via INTRAVENOUS
  Filled 2017-08-13: qty 1

## 2017-08-13 MED ORDER — DOCUSATE SODIUM 100 MG PO CAPS: ORAL_CAPSULE | ORAL | 0 refills | Status: DC

## 2017-08-13 MED ORDER — CEPHALEXIN 500 MG PO CAPS: 500.0000 mg | ORAL_CAPSULE | Freq: Three times a day (TID) | ORAL | 0 refills | Status: DC

## 2017-08-13 MED ORDER — SODIUM CHLORIDE 0.9 % IV BOLUS (SEPSIS)
1000.0000 mL | INTRAVENOUS | Status: AC
  Administered 2017-08-13: 1000 mL via INTRAVENOUS

## 2017-08-13 MED ORDER — ONDANSETRON 4 MG PO TBDP: ORAL_TABLET | ORAL | 0 refills | Status: DC

## 2017-08-13 MED ORDER — CEFTRIAXONE SODIUM IN DEXTROSE 20 MG/ML IV SOLN
1.0000 g | Freq: Once | INTRAVENOUS | Status: AC
  Administered 2017-08-13: 1 g via INTRAVENOUS
  Filled 2017-08-13: qty 50

## 2017-08-13 MED ORDER — MORPHINE SULFATE (PF) 4 MG/ML IV SOLN
4.0000 mg | Freq: Once | INTRAVENOUS | Status: AC
  Administered 2017-08-13: 4 mg via INTRAVENOUS
  Filled 2017-08-13: qty 1

## 2017-08-13 MED ORDER — OXYCODONE-ACETAMINOPHEN 5-325 MG PO TABS: 1.0000 | ORAL_TABLET | Freq: Four times a day (QID) | ORAL | 0 refills | Status: DC | PRN

## 2017-08-13 MED ORDER — ONDANSETRON HCL 4 MG/2ML IJ SOLN
4.0000 mg | INTRAMUSCULAR | Status: AC
  Administered 2017-08-13: 4 mg via INTRAVENOUS
  Filled 2017-08-13: qty 2

## 2017-08-13 NOTE — ED Notes (Signed)
Bladder scan 150 ml

## 2017-08-13 NOTE — Discharge Instructions (Signed)
It once again appears that you have an infection of the kidney on your left side.  Fortunately your labs were all normal today (except for the urinalysis) and you have a urine culture pending.  We treated you with ceftriaxone 1 g IV and encourage you to take the full course of Keflex as an outpatient.  Take Percocet as prescribed for severe pain. Do not drink alcohol, drive or participate in any other potentially dangerous activities while taking this medication as it may make you sleepy. Do not take this medication with any other sedating medications, either prescription or over-the-counter. If you were prescribed Percocet or Vicodin, do not take these with acetaminophen (Tylenol) as it is already contained within these medications.   This medication is an opiate (or narcotic) pain medication and can be habit forming.  Use it as little as possible to achieve adequate pain control.  Do not use or use it with extreme caution if you have a history of opiate abuse or dependence.  If you are on a pain contract with your primary care doctor or a pain specialist, be sure to let them know you were prescribed this medication today from the [facility] Emergency Department.  This medication is intended for your use only - do not give any to anyone else and keep it in a secure place where nobody else, especially children, have access to it.  It will also cause or worsen constipation, so you may want to consider taking an over-the-counter stool softener while you are taking this medication.    Return to the emergency department if you develop new or worsening symptoms that concern you.

## 2017-08-13 NOTE — ED Triage Notes (Signed)
Pt to triage via wheelchair. Pt reports pain to his left back and flank region that started 2 to 3 hours ago. Pt has hx of same and has been having frequent uti's. Pt is scheduled to have his left kidney removed and has preop scheduled for this in October. Pt 's left kidney is located in his groin region.

## 2017-08-13 NOTE — ED Notes (Signed)
Pt states he has left sided flank pain. He thinks he has a kidney infection. He is due to have kidney removed in October. Has had kidney infections in past. Wife at bedside.

## 2017-08-13 NOTE — ED Notes (Addendum)
Introduced self to patient and family. Informed need to urinate, patient given option for leg bag. Patient denies at this time

## 2017-08-13 NOTE — ED Provider Notes (Signed)
Was able to urinate on his own.  Was discharged per Dr. Kerry Fort plan and discharge instructions.   Lisa Roca, MD 08/13/17 [phone]

## 2017-08-13 NOTE — ED Provider Notes (Addendum)
[facility] Emergency Department Provider Note  ____________________________________________   First MD Initiated Contact with Patient 08/13/17 [phone]     (approximate)  I have reviewed the triage vital signs and the nursing notes.   HISTORY  Chief Complaint Flank Pain    HPI Brian Graham is a 66 y.o. 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  All of his doctors are in [place] but the [facility] regional emergency Department is closer for them.  He presents by private vehicle for acute onset severe left-sided flank and back pain that started 2-3 hours prior to arrival.  This feels exactly like when he has had prior infections.  He also felt like he could not urinate tonight which is a new symptom for him.  The onset was acute, and the symptoms are severe.  Nothing in particular makes the patient's symptoms better nor worse.    He denies fever/chills, chest pain, shortness of breath, nausea, vomiting, but does endorse some burning dysuria similar to prior.  Of note he is scheduled for vascular surgery and just over 24 hours in [facility] for the left iliac artery occlusion.   Past Medical History:  Diagnosis Date  . Anxiety   . Arthritis    "left knee" (08/05/2016)  . Bruises easily   . CAD (coronary artery disease) of artery bypass graft 08/05/2016   Around age 85. CABG - 3 vessel.   . Chronic kidney disease   . Colon polyp   . 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 Essential tremor  06/11/2016   Plans to see Dr. Carles Collet  . Former smoker 09/01/2016   Quit 2012. 40 pack years at least.   . GERD (gastroesophageal reflux disease)   . Headache   . History of blood transfusion 08/1997   "when he had his heart surgery"  . History of shingles 1970-2013 X 3  . HTN (hypertension) 08/05/2016   Amlodipine 5mg , atenolol 50mg  BID, benazepril 20mg   . Hyperlipemia   . Hypothyroidism   . Lumbar disc disease   . Myocardial infarction ([place]) 08/1997  . Peripheral vascular disease ([place])   . PONV (postoperative nausea and vomiting)   . Wears glasses     Patient Active Problem List   Diagnosis Date Noted  . Anxiety 07/24/2017  . Atherosclerosis of native arteries of extremity with intermittent claudication ([place]) 07/06/2017  . Pyelonephritis 04/23/2017  . Weight loss 04/23/2017  . History of adenomatous polyp of colon 04/22/2017  . Aortic atherosclerosis ([place]) 04/13/2017  . Hyperlipidemia 04/04/2017  . Hypothyroidism (acquired) 04/04/2017  . Seasonal allergies 04/04/2017  . Myalgia 10/04/2016  . Smoker 09/01/2016  . 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 Dysuria 08/23/2016  . Chronic pain of both knees 08/23/2016  . Hx of CABG 08/05/2016  . CAD (coronary artery disease) of artery bypass graft 08/05/2016  . Essential hypertension 08/05/2016  . Essential tremor 06/11/2016  . Tension headache 06/11/2016    Past Surgical History:  Procedure Laterality Date  . BACK SURGERY    .  CARDIAC CATHETERIZATION  08/1997  . CORONARY ARTERY BYPASS GRAFT  09/12/1997   "triple"  . INGUINAL HERNIA REPAIR Right 1961  . KNEE RECONSTRUCTION Left 1960s - 1974 X 4  . LAPAROSCOPIC ABLATION RENAL MASS  ~ 2014   "large abscess/pus ball"  . [place]   "they were wedged in the bowel"  . PATELLA FRACTURE SURGERY Left 1963  . PATELLA FRACTURE SURGERY Left ~ 1965   "removed knee cap"  . TONSILLECTOMY      Prior to Admission medications   Medication Sig Start Date End Date Taking?  Authorizing Provider  acetaminophen (TYLENOL) 500 MG tablet Take 1,000 mg by mouth every 6 (six) hours as needed for mild pain.    [provider]  albuterol (PROVENTIL) (2.5 MG/3ML) 0.083% nebulizer solution Take 3 mLs (2.5 mg total) by nebulization every 6 (six) hours as needed for wheezing or shortness of breath. 02/02/17   Juanito Doom, MD  amLODipine (NORVASC) 5 MG tablet Take 1 tablet (5 mg total) by mouth daily. 05/24/17   [name], MD  aspirin 81 MG EC tablet Take 1 tablet (81 mg total) by mouth daily. 08/07/16   Minus Liberty, MD  atenolol (TENORMIN) 50 MG tablet Take 50 mg by mouth daily.  05/10/16   [provider]  atorvastatin (LIPITOR) 80 MG tablet Take 80 mg by mouth every evening.  05/10/16   [provider]  benazepril (LOTENSIN) 20 MG tablet Take 20 mg by mouth daily.  05/26/16   [provider]  budesonide-formoterol (SYMBICORT) 160-4.5 MCG/ACT inhaler Inhale 2 puffs into the lungs 2 (two) times daily.    [provider]  cephALEXin (KEFLEX) 500 MG capsule Take 1 capsule (500 mg total) by mouth 3 (three) times daily. 08/13/17   Hinda Kehr, MD  docusate sodium (COLACE) 100 MG capsule Take 1 tablet once or twice daily as needed for constipation while taking narcotic pain medicine 08/13/17   Hinda Kehr, MD  nicotine (NICODERM CQ - DOSED IN MG/24 HOURS) 14 mg/24hr patch Place 14 mg onto the skin daily.    [provider]  ondansetron (ZOFRAN ODT) 4 MG disintegrating tablet Allow 1-2 tablets to dissolve in your mouth every 8 hours as needed for nausea/vomiting 08/13/17   Hinda Kehr, MD  oxyCODONE-acetaminophen (ROXICET) 5-325 MG tablet Take 1-2 tablets by mouth every 6 (six) hours as needed for severe pain. 08/13/17   Hinda Kehr, MD  PROAIR HFA 108 [phone] Base) MCG/ACT inhaler Inhale 1-2 puffs into the lungs every 4 (four) hours as needed. Patient taking differently: Inhale 1-2 puffs into the lungs every 4 (four)  hours as needed for wheezing or shortness of breath.  06/29/17   Juanito Doom, MD  sertraline (ZOLOFT) 25 MG tablet Take 1 tablet (25 mg total) by mouth daily. 07/19/17   [name], MD  SYNTHROID 125 MCG tablet Take 125 mcg by mouth daily before breakfast.  05/10/16   [provider]  tamsulosin (FLOMAX) 0.4 MG CAPS capsule Take 0.4 mg by mouth daily as needed (urinary issues).    [provider]  Tiotropium Bromide Monohydrate (SPIRIVA RESPIMAT) 2.5 MCG/ACT AERS Inhale 2 puffs into the lungs daily. 06/29/17   Juanito Doom, MD    Allergies Ciprofloxacin and Omeprazole  Family History  Problem Relation Age of Onset  . Alcohol abuse Mother   . Alcohol abuse Father        not involved in life  .  Alcoholism Sister   . Healthy Sister   . Healthy Sister     Social History Social History  Substance Use Topics  . Smoking status: Former Smoker    Packs/day: 1.00    Years: 45.00    Types: Cigarettes    Quit date: 07/10/2017  . Smokeless tobacco: Never Used  . Alcohol use 8.4 oz/week    14 Shots of liquor per week     Comment: daily 2 shots of vodka    Review of Systems Constitutional: No fever/chills Eyes: No visual changes. ENT: No sore throat. Cardiovascular: Denies chest pain. Respiratory: Denies shortness of breath. Gastrointestinal: acute onset severe left flank and left lower quadrant abdominal pain.  No nausea, no vomiting.  No diarrhea.  No constipation. Genitourinary: dysuria and feels obstructed Musculoskeletal: Negative for neck pain.  Negative for back pain. Integumentary: Negative for rash. Neurological: Negative for headaches, focal weakness or numbness.   ____________________________________________   PHYSICAL EXAM:  VITAL SIGNS: ED Triage Vitals  Enc Vitals Group     BP 08/13/17 0058 (!) 145/78     Pulse Rate 08/13/17 0058 77     Resp 08/13/17 0058 (!) 24     Temp 08/13/17 0058 97.6 F (36.4 C)     Temp Source 08/13/17  0058 Oral     SpO2 08/13/17 0058 99 %     Weight 08/13/17 0058 63.5 kg (140 lb)     Height 08/13/17 0058 1.753 m (5\' 9" )     Head Circumference --      Peak Flow --      Pain Score 08/13/17 0104 10     Pain Loc --      Pain Edu? --      Excl. in [place]? --     Constitutional: Alert and oriented. generally well-appearing but does appear uncomfortable after one round of morphine and one round of Dilaudid Eyes: Conjunctivae are normal.  Head: Atraumatic. Nose: No congestion/rhinnorhea. Mouth/Throat: Mucous membranes are moist. Neck: No stridor.  No meningeal signs.   Cardiovascular: Normal rate, regular rhythm. Good peripheral circulation. Grossly normal heart sounds. Respiratory: Normal respiratory effort.  No retractions. Lungs CTAB. Gastrointestinal: Soft with diffuse tenderness of the lower abdomen and left CVA tenderness.  No rebound or guarding Musculoskeletal: No lower extremity tenderness nor edema. No gross deformities of extremities. Neurologic:  Normal speech and language. No gross focal neurologic deficits are appreciated.  Skin:  Skin is warm, dry and intact. No rash noted. Psychiatric: Mood and affect are normal. Speech and behavior are normal.  ____________________________________________   LABS (all labs ordered are listed, but only abnormal results are displayed)  Labs Reviewed  URINALYSIS, ROUTINE W REFLEX MICROSCOPIC - Abnormal; Notable for the following:       Result Value   Color, Urine YELLOW (*)    APPearance HAZY (*)    Leukocytes, UA LARGE (*)    Bacteria, UA RARE (*)    All other components within normal limits  CBC WITH DIFFERENTIAL/PLATELET - Abnormal; Notable for the following:    Neutro Abs 6.7 (*)    Monocytes Absolute 1.3 (*)    All other components within normal limits  COMPREHENSIVE METABOLIC PANEL - Abnormal; Notable for the following:    Potassium 3.4 (*)    Glucose, Bld 106 (*)    All other components within normal limits  URINE CULTURE    LIPASE, BLOOD  LACTIC ACID, PLASMA  LACTIC ACID, PLASMA   ____________________________________________  EKG  None -  EKG not ordered by ED physician ____________________________________________  RADIOLOGY   Ct Renal Stone Study  Result Date: 08/13/2017 CLINICAL DATA:  Left-sided flank pain. Hydronephrosis. Previous history of kidney infections. History of congenital left pelvic kidney. EXAM: CT ABDOMEN AND PELVIS WITHOUT CONTRAST TECHNIQUE: Multidetector CT imaging of the abdomen and pelvis was performed following the standard protocol without IV contrast. COMPARISON:  04/15/2017 FINDINGS: Lower chest: The lung bases are clear. Coronary artery calcifications. Hepatobiliary: No focal liver abnormality is seen. No gallstones, gallbladder wall thickening, or biliary dilatation. Pancreas: Unremarkable. No pancreatic ductal dilatation or surrounding inflammatory changes. Spleen: Normal in size without focal abnormality. Adrenals/Urinary Tract: No adrenal gland nodules. Right kidney appears normal. No evidence of hydronephrosis or hydroureter. Left kidney is located in the pelvis. There is prominent hydronephrosis of the left kidney. 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 Bladder wall is mildly thickened, likely due to decompression. Can't exclude cystitis. No intraluminal filling defects or gas demonstrated. Stomach/Bowel: Stomach, small bowel, and colon are not abnormally distended. Scattered stool throughout the colon. No inflammatory changes. Appendix is normal. Vascular/Lymphatic: Aortic atherosclerosis. No enlarged abdominal or pelvic lymph nodes. Reproductive: Prostate is unremarkable. Other: No free air or free fluid in the abdomen. Abdominal wall musculature appears intact. Musculoskeletal: Degenerative changes in the spine. No destructive bone lesions. Surgical clips in the  right groin region. IMPRESSION: 1. Left pelvic kidney with severe hydronephrosis similar to previous study. 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 Right kidney is normal. 2. Bladder wall thickening is likely due to under distention but cystitis not excluded. Correlate with urinalysis. 3. Aortic atherosclerosis. Electronically Signed   By: Lucienne Capers M.D.   On: 08/13/2017 03:18    ____________________________________________   PROCEDURES  Critical Care performed: No   Procedure(s) performed:   Procedures   ____________________________________________   INITIAL IMPRESSION / ASSESSMENT AND PLAN / ED COURSE  Pertinent labs & imaging results that were available during my care of the patient were reviewed by me and considered in my medical decision making (see chart for details).   the patient's pain is starting to be under control with some Dilaudid.  Urinalysis is pending.  Lab results are unremarkable with no leukocytosis and normal kidney function he saw his BUN and creatinine.  I will await the results of the urinalysis, I requested a bladder scan from the nursing staff, and we will obtain a CT renal study protocol to compare against his last CT from 4 months ago to see if there is any significant progression of the hydronephrosis or any indication of  obstruction. Patient agrees with plan. Clinical Course as of Aug 14 803  Sat Aug 13, 2017  0259 urinalysis does indicate acute infection.  I reviewed prior urine culture results and his medical record as well as prior hospitalization notes and clinic notes.  Prior urinary tract infection was generally susceptible E faecalis. I will treat with Rocephin 1 g IV and I am awaiting the rest of the workup as discussed above  [CF]  0431 No acute changes on CT.  Patient received another dose of Dilaudid for pain, and then fell asleep and destaurated to the mid-70s.  Now on O2 by [place].  Will reassess shortly and determine  disposition plan.  Received antibiotics.   CT Renal Laren Everts [CF]  7829 patient is now feeling much better.  He is awake and alert and no longer in acute pain.  We will make sure that he is able to urinate on his own but he wants to go home so that he can follow up tomorrow with his vascular surgery procedure.  I think that is appropriate.I reviewed the patient's prescription history over the last 12 months in the multi-state controlled substances database(s) that includes [place], [place], [place], [place], [place], [place], [place], [place], [place], [place], [place], [place], [place], and [place].  The patient has filled no controlled substances during that time. I will give him prescription for some Percocet as well as Keflex and Zofran.  I gave my usual and customary return precautions.     [CF]  0803 It has been more than 3 hours since my last note and the patient has not yet been able to urinate "more than 4 drops".  I already explained to him that he may need to go home with a Foley catheter, but he wants to keep trying on his own.  I signed out care to Dr. Reita Cliche; if the patient is unable to urinate, he can have a Foley placed for urinary retention and go home with a leg bag.  He should only require admission if he decompensates clinically from his infection or his intractable pain returns, but that seem unlikely at this point.  [CF]    Clinical Course User Index [CF] Hinda Kehr, MD    ____________________________________________  FINAL CLINICAL IMPRESSION(S) / ED DIAGNOSES  Final diagnoses:  Left flank pain  Urinary tract infection without hematuria, site unspecified  Hydronephrosis, unspecified hydronephrosis type     MEDICATIONS GIVEN DURING THIS VISIT:  Medications  morphine 4 MG/ML injection 4 mg (4 mg Intravenous Given 08/13/17 0133)  ondansetron (ZOFRAN) injection 4 mg (4 mg Intravenous Given 08/13/17 0133)  sodium chloride 0.9 % bolus  1,000 mL (0 mLs Intravenous Stopped 08/13/17 0348)  HYDROmorphone (DILAUDID) injection 1 mg (1 mg Intravenous Given 08/13/17 0231)  cefTRIAXone (ROCEPHIN) 1 g in dextrose 5 % 50 mL IVPB - Premix (0 g Intravenous Stopped 08/13/17 0348)  HYDROmorphone (DILAUDID) injection 1 mg (1 mg Intravenous Given 08/13/17 0400)     NEW OUTPATIENT MEDICATIONS STARTED DURING THIS VISIT:  New Prescriptions   CEPHALEXIN (KEFLEX) 500 MG CAPSULE    Take 1 capsule (500 mg total) by mouth 3 (three) times daily.   DOCUSATE SODIUM (COLACE) 100 MG CAPSULE    Take 1 tablet once or twice daily as needed for constipation while taking narcotic pain medicine   ONDANSETRON (ZOFRAN ODT) 4 MG DISINTEGRATING TABLET    Allow 1-2 tablets to dissolve in your mouth every 8 hours as needed for nausea/vomiting   OXYCODONE-ACETAMINOPHEN (ROXICET) 5-325 MG TABLET    Take 1-2 tablets by mouth every 6 (six) hours as needed for severe pain.    Modified Medications   No medications on file    Discontinued Medications   No medications on file     Note:  This document was prepared using Dragon voice recognition software and may include unintentional dictation errors.    Hinda Kehr, MD 08/13/17 2774    Hinda Kehr, MD 08/13/17 [id number]

## 2017-08-13 NOTE — ED Notes (Signed)
In and out cath completed by this RN with assistance from [facility] for urine specimen.

## 2017-08-14 LAB — URINE CULTURE
CULTURE: NO GROWTH
Special Requests: NORMAL

## 2017-08-15 ENCOUNTER — Ambulatory Visit (HOSPITAL_COMMUNITY): Admission: RE | Admit: 2017-08-15 | Payer: Medicare Other | Source: Ambulatory Visit | Admitting: Vascular Surgery

## 2017-08-15 ENCOUNTER — Encounter (HOSPITAL_COMMUNITY): Admission: RE | Payer: Self-pay | Source: Ambulatory Visit

## 2017-08-15 SURGERY — AORTOGRAM
Anesthesia: General

## 2017-08-17 ENCOUNTER — Ambulatory Visit: Payer: Medicare Other | Admitting: Pulmonary Disease

## 2017-08-18 ENCOUNTER — Other Ambulatory Visit: Payer: Self-pay

## 2017-08-18 ENCOUNTER — Telehealth: Payer: Self-pay | Admitting: Family Medicine

## 2017-08-18 NOTE — Telephone Encounter (Signed)
Removed from med list and added to allergies

## 2017-08-18 NOTE — Telephone Encounter (Signed)
Spoke with patient who verbalized understanding. We did discuss him trying some yogurt or a probiotic to help with his GI tract. He states the antibiotic gave him GI upset and insomnia

## 2017-08-18 NOTE — Telephone Encounter (Signed)
Final urine culture shows no growth. He may stop the keflex completely- no replacement needed.   What is his reaction to keflex so it can be listed under intolerances.

## 2017-08-18 NOTE — Telephone Encounter (Signed)
Pt state that when he was d/c from the hospital they put him on keflex for the kidney disease and he is not able to tolerate the medication and would like to have something different called in.  Pharm:  [facility] [place] on [address]

## 2017-08-23 ENCOUNTER — Other Ambulatory Visit: Payer: Self-pay | Admitting: *Deleted

## 2017-08-26 ENCOUNTER — Telehealth: Payer: Self-pay | Admitting: Family Medicine

## 2017-08-26 ENCOUNTER — Other Ambulatory Visit: Payer: Self-pay

## 2017-08-26 MED ORDER — ATENOLOL 50 MG PO TABS: 50.0000 mg | ORAL_TABLET | Freq: Every day | ORAL | 3 refills | Status: DC

## 2017-08-26 NOTE — Telephone Encounter (Signed)
Prescription sent to pharmacy as requested.

## 2017-08-26 NOTE — Telephone Encounter (Signed)
MEDICATION: atenolol (TENORMIN) 50 MG tablet  PHARMACY:   [facility] - [name], [place] [phone] (Phone) [phone] (Fax)    IS THIS A 90 DAY SUPPLY : yes  IS PATIENT OUT OF MEDICATION: yes  IF NOT; HOW MUCH IS LEFT:   LAST APPOINTMENT DATE: @9 /12/18  NEXT APPOINTMENT DATE:@Visit  date not found  OTHER COMMENTS:    **Let patient know to contact pharmacy at the end of the day to make sure medication is ready. **  ** Please notify patient to allow 48-72 hours to process**  **Encourage patient to contact the pharmacy for refills or they can request refills through [facility]**

## 2017-08-29 ENCOUNTER — Encounter (HOSPITAL_COMMUNITY): Payer: Self-pay | Admitting: *Deleted

## 2017-08-29 NOTE — Progress Notes (Signed)
Pt denies any acute cardiopulmonary issues. Pt under the care of DR. Hochrein, Cardiology. Pt stated that there are no changes since seen in PAT on 08/11/17. Pt stated that he still has all of his pre-op instructions from PAT visit. Medications to take DOS reviewed. Pt stopped taking vitamins and herbal medications. Does not take any NSAIDs ie: Ibuprofen, Advil, Naproxen (aleve), Motrin, BC and Goody Powder. Pt verbalized understanding of all pre-op instructions.

## 2017-08-31 ENCOUNTER — Ambulatory Visit (HOSPITAL_COMMUNITY): Payer: Medicare Other | Admitting: Anesthesiology

## 2017-08-31 ENCOUNTER — Ambulatory Visit (HOSPITAL_COMMUNITY)
Admission: RE | Admit: 2017-08-31 | Discharge: 2017-08-31 | Disposition: A | Discharge status: Home / Self Care | Payer: Medicare Other | Source: Ambulatory Visit | Attending: Vascular Surgery | Admitting: Vascular Surgery

## 2017-08-31 ENCOUNTER — Encounter (HOSPITAL_COMMUNITY): Payer: Self-pay | Admitting: Anesthesiology

## 2017-08-31 ENCOUNTER — Encounter (HOSPITAL_COMMUNITY)
Admission: RE | Disposition: A | Discharge status: Home / Self Care | Payer: Self-pay | Source: Ambulatory Visit | Attending: Vascular Surgery

## 2017-08-31 DIAGNOSIS — I2510 Atherosclerotic heart disease of native coronary artery without angina pectoris: Secondary | ICD-10-CM | POA: Diagnosis not present

## 2017-08-31 DIAGNOSIS — Z87891 Personal history of nicotine dependence: Secondary | ICD-10-CM | POA: Diagnosis not present

## 2017-08-31 DIAGNOSIS — F419 Anxiety disorder, unspecified: Secondary | ICD-10-CM | POA: Diagnosis not present

## 2017-08-31 DIAGNOSIS — J449 Chronic obstructive pulmonary disease, unspecified: Secondary | ICD-10-CM | POA: Insufficient documentation

## 2017-08-31 DIAGNOSIS — N189 Chronic kidney disease, unspecified: Secondary | ICD-10-CM | POA: Diagnosis not present

## 2017-08-31 DIAGNOSIS — E039 Hypothyroidism, unspecified: Secondary | ICD-10-CM | POA: Insufficient documentation

## 2017-08-31 DIAGNOSIS — I739 Peripheral vascular disease, unspecified: Secondary | ICD-10-CM | POA: Insufficient documentation

## 2017-08-31 DIAGNOSIS — Z888 Allergy status to other drugs, medicaments and biological substances status: Secondary | ICD-10-CM | POA: Diagnosis not present

## 2017-08-31 DIAGNOSIS — I252 Old myocardial infarction: Secondary | ICD-10-CM | POA: Diagnosis not present

## 2017-08-31 DIAGNOSIS — I771 Stricture of artery: Secondary | ICD-10-CM | POA: Insufficient documentation

## 2017-08-31 DIAGNOSIS — E785 Hyperlipidemia, unspecified: Secondary | ICD-10-CM | POA: Insufficient documentation

## 2017-08-31 DIAGNOSIS — Z951 Presence of aortocoronary bypass graft: Secondary | ICD-10-CM | POA: Diagnosis not present

## 2017-08-31 DIAGNOSIS — K219 Gastro-esophageal reflux disease without esophagitis: Secondary | ICD-10-CM | POA: Diagnosis not present

## 2017-08-31 DIAGNOSIS — I70202 Unspecified atherosclerosis of native arteries of extremities, left leg: Secondary | ICD-10-CM | POA: Diagnosis not present

## 2017-08-31 DIAGNOSIS — I129 Hypertensive chronic kidney disease with stage 1 through stage 4 chronic kidney disease, or unspecified chronic kidney disease: Secondary | ICD-10-CM | POA: Diagnosis not present

## 2017-08-31 HISTORY — PX: AORTOGRAM: SHX6300

## 2017-08-31 LAB — POCT I-STAT, CHEM 8
BUN: 11 mg/dL (ref 6–20)
CALCIUM ION: 1.13 mmol/L — AB (ref 1.15–1.40)
CREATININE: 1.1 mg/dL (ref 0.61–1.24)
Chloride: 99 mmol/L — ABNORMAL LOW (ref 101–111)
GLUCOSE: 104 mg/dL — AB (ref 65–99)
HCT: 40 % (ref 39.0–52.0)
HEMOGLOBIN: 13.6 g/dL (ref 13.0–17.0)
POTASSIUM: 4.4 mmol/L (ref 3.5–5.1)
Sodium: 138 mmol/L (ref 135–145)
TCO2: 30 mmol/L (ref 22–32)

## 2017-08-31 SURGERY — AORTOGRAM
Anesthesia: General | Site: Groin | Laterality: Right

## 2017-08-31 MED ORDER — LACTATED RINGERS IV SOLN
INTRAVENOUS | Status: DC | PRN
  Administered 2017-08-31 (×2): via INTRAVENOUS

## 2017-08-31 MED ORDER — FENTANYL CITRATE (PF) 100 MCG/2ML IJ SOLN
INTRAMUSCULAR | Status: DC | PRN
Start: 2017-08-31 — End: 2017-08-31
  Administered 2017-08-31: 50 ug via INTRAVENOUS
  Administered 2017-08-31: 100 ug via INTRAVENOUS
  Administered 2017-08-31 (×2): 50 ug via INTRAVENOUS

## 2017-08-31 MED ORDER — HYDROMORPHONE HCL 1 MG/ML IJ SOLN
0.2500 mg | INTRAMUSCULAR | Status: DC | PRN
  Administered 2017-08-31: 0.5 mg via INTRAVENOUS

## 2017-08-31 MED ORDER — SODIUM CHLORIDE 0.9 % IV SOLN
INTRAVENOUS | Status: DC | PRN
  Administered 2017-08-31: 500 mL

## 2017-08-31 MED ORDER — FENTANYL CITRATE (PF) 100 MCG/2ML IJ SOLN
INTRAMUSCULAR | Status: AC
  Filled 2017-08-31: qty 2

## 2017-08-31 MED ORDER — SODIUM CHLORIDE 0.9 % WEIGHT BASED INFUSION: 1.0000 mL/kg/h | INTRAVENOUS | Status: DC

## 2017-08-31 MED ORDER — LABETALOL HCL 5 MG/ML IV SOLN: 10.0000 mg | INTRAVENOUS | Status: DC | PRN

## 2017-08-31 MED ORDER — PROPOFOL 10 MG/ML IV BOLUS
INTRAVENOUS | Status: AC
  Filled 2017-08-31: qty 20

## 2017-08-31 MED ORDER — PROPOFOL 10 MG/ML IV BOLUS
INTRAVENOUS | Status: DC | PRN
  Administered 2017-08-31: 150 mg via INTRAVENOUS

## 2017-08-31 MED ORDER — DEXAMETHASONE SODIUM PHOSPHATE 10 MG/ML IJ SOLN
INTRAMUSCULAR | Status: DC | PRN
  Administered 2017-08-31: 10 mg via INTRAVENOUS

## 2017-08-31 MED ORDER — FENTANYL CITRATE (PF) 250 MCG/5ML IJ SOLN
INTRAMUSCULAR | Status: AC
Start: 2017-08-31 — End: 2017-08-31
  Filled 2017-08-31: qty 5

## 2017-08-31 MED ORDER — MIDAZOLAM HCL 2 MG/2ML IJ SOLN
INTRAMUSCULAR | Status: AC
  Filled 2017-08-31: qty 2

## 2017-08-31 MED ORDER — CLOPIDOGREL BISULFATE 75 MG PO TABS: 75.0000 mg | ORAL_TABLET | Freq: Every day | ORAL | 11 refills | Status: DC

## 2017-08-31 MED ORDER — SODIUM CHLORIDE 0.9% FLUSH: 3.0000 mL | Freq: Two times a day (BID) | INTRAVENOUS | Status: DC

## 2017-08-31 MED ORDER — SODIUM CHLORIDE 0.9 % IV SOLN: INTRAVENOUS | Status: DC

## 2017-08-31 MED ORDER — SUGAMMADEX SODIUM 200 MG/2ML IV SOLN
INTRAVENOUS | Status: DC | PRN
  Administered 2017-08-31: 200 mg via INTRAVENOUS

## 2017-08-31 MED ORDER — HYDROMORPHONE HCL 1 MG/ML IJ SOLN
INTRAMUSCULAR | Status: AC
  Filled 2017-08-31: qty 1

## 2017-08-31 MED ORDER — CLOPIDOGREL BISULFATE 300 MG PO TABS
ORAL_TABLET | ORAL | Status: AC
  Administered 2017-08-31: 300 mg via ORAL
  Filled 2017-08-31: qty 1

## 2017-08-31 MED ORDER — PHENYLEPHRINE HCL 10 MG/ML IJ SOLN
INTRAMUSCULAR | Status: DC | PRN
  Administered 2017-08-31: 80 ug via INTRAVENOUS

## 2017-08-31 MED ORDER — HEPARIN SODIUM (PORCINE) 1000 UNIT/ML IJ SOLN
INTRAMUSCULAR | Status: DC | PRN
  Administered 2017-08-31: 6500 [IU] via INTRAVENOUS

## 2017-08-31 MED ORDER — LIDOCAINE HCL (CARDIAC) 20 MG/ML IV SOLN
INTRAVENOUS | Status: DC | PRN
  Administered 2017-08-31: 100 mg via INTRAVENOUS

## 2017-08-31 MED ORDER — HYDROMORPHONE HCL 1 MG/ML IJ SOLN: 0.2500 mg | INTRAMUSCULAR | Status: DC | PRN

## 2017-08-31 MED ORDER — SODIUM CHLORIDE 0.9 % IV SOLN: 250.0000 mL | INTRAVENOUS | Status: DC | PRN

## 2017-08-31 MED ORDER — PROTAMINE SULFATE 10 MG/ML IV SOLN
INTRAVENOUS | Status: DC | PRN
  Administered 2017-08-31: 10 mg via INTRAVENOUS
  Administered 2017-08-31: 20 mg via INTRAVENOUS
  Administered 2017-08-31: 10 mg via INTRAVENOUS

## 2017-08-31 MED ORDER — CLOPIDOGREL BISULFATE 300 MG PO TABS
300.0000 mg | ORAL_TABLET | Freq: Once | ORAL | Status: AC
  Administered 2017-08-31: 300 mg via ORAL

## 2017-08-31 MED ORDER — HYDRALAZINE HCL 20 MG/ML IJ SOLN: 5.0000 mg | INTRAMUSCULAR | Status: DC | PRN

## 2017-08-31 MED ORDER — VANCOMYCIN HCL IN DEXTROSE 1-5 GM/200ML-% IV SOLN
1000.0000 mg | INTRAVENOUS | Status: AC
  Administered 2017-08-31: 1000 mg via INTRAVENOUS
  Filled 2017-08-31: qty 200

## 2017-08-31 MED ORDER — IODIXANOL 320 MG/ML IV SOLN
INTRAVENOUS | Status: DC | PRN
  Administered 2017-08-31: 55.1 mL via INTRAVENOUS

## 2017-08-31 MED ORDER — PHENYLEPHRINE HCL 10 MG/ML IJ SOLN
INTRAVENOUS | Status: DC | PRN
  Administered 2017-08-31: 50 ug/min via INTRAVENOUS

## 2017-08-31 MED ORDER — ONDANSETRON HCL 4 MG/2ML IJ SOLN
INTRAMUSCULAR | Status: DC | PRN
  Administered 2017-08-31: 4 mg via INTRAVENOUS

## 2017-08-31 MED ORDER — 0.9 % SODIUM CHLORIDE (POUR BTL) OPTIME
TOPICAL | Status: DC | PRN
  Administered 2017-08-31: 1000 mL

## 2017-08-31 MED ORDER — SODIUM CHLORIDE 0.9% FLUSH: 3.0000 mL | INTRAVENOUS | Status: DC | PRN

## 2017-08-31 SURGICAL SUPPLY — 49 items
BAG BANDED W/RUBBER/TAPE 36X54 (MISCELLANEOUS) ×2 IMPLANT
BAG SNAP BAND KOVER 36X36 (MISCELLANEOUS) ×2 IMPLANT
BLADE SURG 11 STRL SS (BLADE) ×2 IMPLANT
BLADE SURG 15 STRL LF DISP TIS (BLADE) IMPLANT
BLADE SURG 15 STRL SS (BLADE)
CANISTER SUCT 3000ML PPV (MISCELLANEOUS) ×2 IMPLANT
CATH ANGIO 5F BER 65CM (CATHETERS) IMPLANT
CATH OMNI FLUSH .035X70CM (CATHETERS) ×2 IMPLANT
CATH STRAIGHT 5FR 65CM (CATHETERS) ×2 IMPLANT
COVER BACK TABLE 80X110 HD (DRAPES) IMPLANT
COVER DOME SNAP 22 D (MISCELLANEOUS) ×2 IMPLANT
COVER MAYO STAND STRL (DRAPES) ×2 IMPLANT
COVER PROBE W GEL 5X96 (DRAPES) ×2 IMPLANT
COVER SURGICAL LIGHT HANDLE (MISCELLANEOUS) ×2 IMPLANT
DEVICE TORQUE KENDALL .025-038 (MISCELLANEOUS) IMPLANT
DRAPE FEMORAL ANGIO 80X135IN (DRAPES) ×2 IMPLANT
DRAPE HALF SHEET 40X57 (DRAPES) ×2 IMPLANT
DRSG TEGADERM 2-3/8X2-3/4 SM (GAUZE/BANDAGES/DRESSINGS) ×2 IMPLANT
ELECT REM PT RETURN 9FT ADLT (ELECTROSURGICAL)
ELECTRODE REM PT RTRN 9FT ADLT (ELECTROSURGICAL) IMPLANT
GAUZE SPONGE 2X2 8PLY STRL LF (GAUZE/BANDAGES/DRESSINGS) ×1 IMPLANT
GAUZE SPONGE 4X4 16PLY XRAY LF (GAUZE/BANDAGES/DRESSINGS) ×2 IMPLANT
GLOVE BIO SURGEON STRL SZ7 (GLOVE) ×2 IMPLANT
GUIDEWIRE ANGLED .035X150CM (WIRE) IMPLANT
KIT BASIN OR (CUSTOM PROCEDURE TRAY) ×2 IMPLANT
KIT ENCORE 26 ADVANTAGE (KITS) ×2 IMPLANT
KIT ROOM TURNOVER OR (KITS) ×2 IMPLANT
NEEDLE PERC 18GX7CM (NEEDLE) ×2 IMPLANT
NS IRRIG 1000ML POUR BTL (IV SOLUTION) ×2 IMPLANT
PACK PERIPHERAL VASCULAR (CUSTOM PROCEDURE TRAY) IMPLANT
PAD ARMBOARD 7.5X6 YLW CONV (MISCELLANEOUS) ×4 IMPLANT
PROTECTION STATION PRESSURIZED (MISCELLANEOUS) ×2
SET MICROPUNCTURE 5F STIFF (MISCELLANEOUS) ×2 IMPLANT
SHEATH AVANTI 11CM 5FR (MISCELLANEOUS) ×2 IMPLANT
SHEATH BRITE TIP 8FR 35CM (SHEATH) ×2 IMPLANT
SPONGE GAUZE 2X2 STER 10/PKG (GAUZE/BANDAGES/DRESSINGS) ×1
STATION PROTECTION PRESSURIZED (MISCELLANEOUS) ×1 IMPLANT
STENT ICAST 9X38X120 (Permanent Stent) ×2 IMPLANT
STOPCOCK MORSE 400PSI 3WAY (MISCELLANEOUS) ×2 IMPLANT
SYR 10ML LL (SYRINGE) ×6 IMPLANT
SYR 30ML LL (SYRINGE) ×2 IMPLANT
SYR MEDRAD MARK V 150ML (SYRINGE) IMPLANT
SYRINGE 20CC LL (MISCELLANEOUS) ×2 IMPLANT
TOWEL GREEN STERILE (TOWEL DISPOSABLE) ×2 IMPLANT
TUBING HIGH PRESSURE 120CM (CONNECTOR) IMPLANT
WATER STERILE IRR 1000ML POUR (IV SOLUTION) IMPLANT
WIRE BENTSON .035X145CM (WIRE) ×2 IMPLANT
WIRE LUNDERQUIST .035X180CM (WIRE) ×2 IMPLANT
WIRE ROSEN-J .035X260CM (WIRE) ×2 IMPLANT

## 2017-08-31 NOTE — Op Note (Signed)
OPERATIVE NOTE   PROCEDURE: 1.  Right common femoral artery cannulation under ultrasound guidance 2.  Placement of catheter in aorta 3.  Aortogram 4.  Bilateral pelvic angiogram (Limited bilateral leg runoff) 5.  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   POST-OPERATIVE DIAGNOSIS: same as above   SURGEON: Adele Barthel, MD  ANESTHESIA: GETA  ESTIMATED BLOOD LOSS: 50 cc  CONTRAST: 55 cc  FINDING(S):  Aorta: patent  Large lumbar arteries evident in distal aorta: left larger with more extensive collaterals that normally expected, possibly the left pelvic kidney's arterial inflow   Right Left  RA Patent Abherent artery originates where left renal orifce should be located but the perfusion appears to be oriented toward the left colon, no nephrogram evident  CIA Patent, somewhat calcified Patent, somewhat calcific, short occlusion in the proximal segment: resolved with angioplasty and stenting  EIA Patent Patent, 50% stenosis in mid-segment  IIA Patent Patent  CFA Patent Patent  SFA Patent proximally Patent proximally,   PFA Patent proximally Patent proximally   SPECIMEN(S):  none  INDICATIONS:   Brian Graham is a 66 y.o. 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  On pelvic CT, concerns for L iliac arterial stenosis vs occlusion were raised.  I recommended: aortogram, bilateral pelvic angiogram, and possible left iliac intervention.  Due the patient's underlying co-morbidites, he had to be canceled multiple times.  The patient presents today for the recommended procedures.  I discussed with the patient the nature of angiographic procedures, especially the limited patencies of any endovascular intervention.  The patient is aware of that the risks of an angiographic procedure include but are not limited to:  bleeding, infection, access site complications, renal failure, embolization, rupture of vessel, dissection, possible need for emergent surgical intervention, possible need for surgical procedures to treat the patient's pathology, and stroke and death.  The patient is aware of the risks and agrees to proceed.  DESCRIPTION: After full informed consent was obtained from the patient, the patient was brought back to the hybrid OR.  The patient was placed supine upon the hybrid OR table.  Once adequate anesthesia was obtained, the patient was prepped and drape in the standard fashion for an angiographic procedure.  At this point, attention was turned to the right groin.  Under ultrasound guidance, the subcutaneous tissue surrounding the right common femoral artery was anesthesized with 1% lidocaine with epinephrine.  The artery was then cannulated with a micropuncture needle.  The microwire was advanced into the iliac arterial system.  The needle was exchanged for a microsheath, which was loaded into the common femoral artery over the wire.  The microwire was exchanged for a Bentson wire which was advanced into the aorta.  The microsheath was then exchanged for a 5-Fr sheath which was loaded into the common femoral artery.  The Omniflush catheter was then loaded over the wire up to the level of L1.  The catheter was connected to the power injector circuit.  After de-airring and de-clotting the circuit, a power injector aortogram was completed.    Using a Bentson wire and Omniflush catheter, the left common iliac artery was selected.  The catheter and wire were advanced into the common iliac artery.  Surprisingly, I was able to advance the Bentson wire past the occlusion but the wire stuck in  what I thought was the external iliac artery.  I exchanged the catheter for a end-hole catheter.  Using this combination, I was able to advanced the wire into the left common femoral artery.  I did a hand injection to verify  I was in the left common femoral artery.  Based on measurement, I felt an attempt at antegrade stenting of the left common iliac artery was indicated.  The wire was exchanged for a Rosen wire.  The patient's right femoral sheath was exchanged for a 8-Fr Destination sheath, which was lodged in the right common iliac artery as the sheath would not cross the aortic bifurcation.  The dilator was removed.  The patient was given 6500 units of Heparin intravenously, which was a therapeutic bolus.  I replaced the end-hole catheter down into the left common femoral artery and then exchanged the wire for a Lundequist wire.  The catheter was removed and the dilator reloaded into the right sheath.  Using this combination, I was able to pass the sheath into the left common iliac artery, past the occlusion.  I removed the dilator and then did a hand injection to verify this.  Based on the measurements, I selected a 9 mm x 38 mm iCAST stent as the occlusion appeared to be extremely calcified.  I had concerns opening this occlusion would rupture the common iliac artery.    I placed the stent through the sheath and then pulled the sheath back to the aortic bifurcation.  I did a magnified injection to verify the position of the aortic bifurcation and the occlusion.  I pulled the stent back so it deployed at the orifice of the left common iliac artery.  This stent was deployed at 10 atm for 1 minute.  I deflated the balloon and then exchanged the wire for the Rosen wire, as the Lundequist appeared to be distorting the anatomy.  I did a completion injection which demonstrated resolution of the occlusion without rupture of the artery.  I did an additional injection at the level of the left femoral bifurcation to verify no emobolization to the femoral bifurcation.  I recaptured the [facility] wire tip with the end-hole catheter.  I pulled the wire and catheter out.  The sheath was aspirated.  No clots were present and the sheath was  reloaded with heparinized saline.  I gave the patient 40 mg of Protamine to reverse anticoagulation.  The right femoral sheath was pulled and pressure held for 20 minutes.  No active bleeding was present.  A bandage was applied to the right femoral puncture site.   COMPLICATIONS: none  CONDITION: stable   Adele Barthel, MD, [facility] [place] Office: [phone] Pager: [phone]  08/31/2017, 10:05 AM

## 2017-08-31 NOTE — Anesthesia Procedure Notes (Signed)
Arterial Line Insertion Start/End10/08/2017 8:00 AM, 08/31/2017 8:20 AM Performed by: Clearnce Sorrel, CRNA  Patient location: Pre-op. Lidocaine 1% used for infiltration Right, radial was placed Catheter size: 20 G Hand hygiene performed  and maximum sterile barriers used   Attempts: 4 Procedure performed without using ultrasound guided technique. Following insertion, dressing applied. Post procedure assessment: normal and unchanged  Patient tolerated the procedure well with no immediate complications.

## 2017-08-31 NOTE — Anesthesia Preprocedure Evaluation (Addendum)
Anesthesia Evaluation  Patient identified by MRN, date of birth, ID band Patient awake    History of Anesthesia Complications (+) PONV and history of anesthetic complications  Airway Mallampati: I  TM Distance: >3 FB Neck ROM: full    Dental  (+) Edentulous Upper, Edentulous Lower, Dental Advidsory Given   Pulmonary COPD, former smoker,    breath sounds clear to auscultation       Cardiovascular hypertension, + CAD, + Past MI and + Peripheral Vascular Disease   Rhythm:regular Rate:Normal     Neuro/Psych  Headaches,    GI/Hepatic Neg liver ROS, GERD  Medicated and Controlled,  Endo/Other  Hypothyroidism   Renal/GU Renal disease     Musculoskeletal  (+) Arthritis ,   Abdominal   Peds  Hematology   Anesthesia Other Findings Former smoker 50 years  Reproductive/Obstetrics                            Anesthesia Physical Anesthesia Plan  ASA: III  Anesthesia Plan: General   Post-op Pain Management:    Induction: Intravenous  PONV Risk Score and Plan: 3 and Ondansetron, Dexamethasone, Midazolam, Propofol infusion and Treatment may vary due to age or medical condition  Airway Management Planned: Oral ETT  Additional Equipment: Arterial line  Intra-op Plan:   Post-operative Plan: Possible Post-op intubation/ventilation  Informed Consent: I have reviewed the patients History and Physical, chart, labs and discussed the procedure including the risks, benefits and alternatives for the proposed anesthesia with the patient or authorized representative who has indicated his/her understanding and acceptance.   Dental Advisory Given and Dental advisory given  Plan Discussed with: Anesthesiologist and CRNA  Anesthesia Plan Comments:        Anesthesia Quick Evaluation

## 2017-08-31 NOTE — Discharge Instructions (Signed)

## 2017-08-31 NOTE — H&P (Signed)
Brief History and Physical  History of Present Illness   Brian Graham is a 66 y.o. male who presents with chief complaint: known L iliac artery occlusion.  The patient presents today for Aortogram, pelvic angiogram, possible L iliac intervention.    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  He has had multiple delays to the procedure due to respiratory decompensation with baseline severe COPD and repeat pelvic kidney infection.   Past Medical History:  Diagnosis Date  . Anxiety   . Arthritis    "left knee" (08/05/2016)  . Bruises easily   . CAD (coronary artery disease) of artery bypass graft 08/05/2016   Around age 46. CABG - 3 vessel.   . Chronic kidney disease   . Colon polyp   . COPD, severe ([place]) 08/23/2016   Alpha 1 studies normal per prior pulmonary group notes Smoked 40 years, quit in 2012 June 2016 PFT from prior pulmonary group: "significant obstruction" FEV1 1.58L (47% pred), Residual volume 171% pred, DLCO 59% pred Simple Spirometry>> 09/15/2016 ratio 42% FEV1 1.02 L / 31%   . Essential tremor 06/11/2016   Plans to see Dr. Carles Collet  . Former smoker 09/01/2016   Quit 2012. 40 pack years at least.   . GERD (gastroesophageal reflux disease)   . Headache   . History of blood transfusion 08/1997   "when he had his heart surgery"  . History of shingles 1970-2013 X 3  . HTN (hypertension) 08/05/2016   Amlodipine 5mg , atenolol 50mg  BID, benazepril 20mg   . Hyperlipemia   . Hypothyroidism   . Lumbar disc disease   . Myocardial infarction ([place]) 08/1997  . Peripheral vascular disease ([place])   . PONV (postoperative nausea and vomiting)   . Wears glasses     Past Surgical History:  Procedure Laterality Date  . BACK SURGERY    . CARDIAC CATHETERIZATION  08/1997  . CORONARY ARTERY BYPASS GRAFT  09/12/1997   "triple"  . INGUINAL HERNIA REPAIR Right 1961  . KNEE RECONSTRUCTION Left 1960s - 1974 X 4  . LAPAROSCOPIC ABLATION RENAL MASS   ~ 2014   "large abscess/pus ball"  . [place]   "they were wedged in the bowel"  . PATELLA FRACTURE SURGERY Left 1963  . PATELLA FRACTURE SURGERY Left ~ 1965   "removed knee cap"  . TONSILLECTOMY      Social History   Social History  . Marital status: Married    Spouse name: N/A  . Number of children: N/A  . Years of education: N/A   Occupational History  . Not on file.   Social History Main Topics  . Smoking status: Former Smoker    Packs/day: 1.00    Years: 45.00    Types: Cigarettes    Quit date: 07/10/2017  . Smokeless tobacco: Never Used  . Alcohol use 8.4 oz/week    14 Shots of liquor per week     Comment: daily 2 shots of vodka  . Drug use: No  . Sexual activity: Not Currently   Other Topics Concern  . Not on file   Social History Narrative   Lives with wife. 2 adopted daughters from [place].       Retired from [employer] History  Problem Relation Age of Onset  . Alcohol abuse Mother   . Alcohol abuse Father        not involved in life  . Alcoholism Sister   .  Healthy Sister   . Healthy Sister     Current Facility-Administered Medications  Medication Dose Route Frequency Provider Last Rate Last Dose  . 0.9 %  sodium chloride infusion   Intravenous Continuous Conrad Strum, MD      . vancomycin (VANCOCIN) IVPB 1000 mg/200 mL premix  1,000 mg Intravenous 60 min Pre-Op Conrad [place], MD        Allergies  Allergen Reactions  . Keflex [Cephalexin] Diarrhea, Nausea Only and Other (See Comments)    Insomnia  . Ciprofloxacin Diarrhea  . Levaquin [Levofloxacin] Other (See Comments)    insomnia  . Omeprazole Diarrhea    Review of Systems: As listed above, otherwise negative.   Physical Examination   Vitals:   08/31/17 0657 08/31/17 0700  BP:  (!) 155/78  Pulse:  76  Resp:  16  Temp:  98.2 F (36.8 C)  SpO2:  95%  Weight: 140 lb (63.5 kg)   Height: 5\' 9"  (1.753 m)    Body mass index is 20.67 kg/m.  General  alert, O x 3, WD, NAD  Pulmonary Sym exp, good B air movt, CTA B  Cardiac RRR, Nl S1, S2, no Murmurs, No rubs, No S3,S4  Musculo- skeletal M/S 5/5, no limb ischemia evident  Neurologic Pain and light touch intact in extremities ,    Laboratory   CBC CBC Latest Ref Rng & Units 08/31/2017 08/13/2017 08/11/2017  WBC 3.8 - 10.6 K/uL - 10.5 6.3  Hemoglobin 13.0 - 17.0 g/dL 13.6 14.6 14.4  Hematocrit 39.0 - 52.0 % 40.0 43.0 43.3  Platelets 150 - 440 K/uL - 255 246    BMP BMP Latest Ref Rng & Units 08/31/2017 08/13/2017 08/11/2017  Glucose 65 - 99 mg/dL 104(H) 106(H) 85  BUN 6 - 20 mg/dL 11 19 11   Creatinine 0.61 - 1.24 mg/dL 1.10 0.80 0.76  BUN/Creat Ratio 10 - 24 - - -  Sodium 135 - 145 mmol/L 138 138 139  Potassium 3.5 - 5.1 mmol/L 4.4 3.4(L) 4.1  Chloride 101 - 111 mmol/L 99(L) 102 103  CO2 22 - 32 mmol/L - 27 27  Calcium 8.9 - 10.3 mg/dL - 8.9 8.8(L)    Coagulation No results found for: INR, PROTIME No results found for: PTT  Lipids    Component Value Date/Time   CHOL 141 05/06/2017 0923   TRIG 88 05/06/2017 0923   HDL 55 05/06/2017 0923   CHOLHDL 2.6 05/06/2017 0923   [place] 68 05/06/2017 7425    Medical Decision Making   Brian Graham is a 66 y.o. 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   The patient is scheduled for: Aortogram, pelvic angiogram, possible L pelvic intervention I discussed with the patient the nature of angiographic procedures, especially the limited patencies of any endovascular intervention.  The patient is aware of that the risks of an angiographic procedure include but are not limited to: bleeding, infection, access site complications, renal failure, embolization, rupture of vessel, dissection, possible need for emergent surgical intervention, possible need for surgical procedures to treat the patient's pathology, and stroke and death.   I reiterated that the severe calcification in the left iliac system might  limit re-entry leading to possible technical failure.  The patient is aware of the risks and agrees to proceed.   Adele Barthel, MD, FACS [facility] [place] Office: [phone] Pager: [phone]  08/31/2017, 8:09 AM

## 2017-08-31 NOTE — Anesthesia Postprocedure Evaluation (Signed)
Anesthesia Post Note  Patient: Brian Graham  Procedure(s) Performed: AORTOGRAM BILATERAL PELVIC ANGIOGRAM WITH LEFT ILIAC ARTERY STENT (Right Groin)     Patient location during evaluation: PACU Anesthesia Type: General Level of consciousness: awake Pain management: pain level controlled Vital Signs Assessment: post-procedure vital signs reviewed and stable Respiratory status: spontaneous breathing Cardiovascular status: stable Anesthetic complications: no    Last Vitals:  Vitals:   08/31/17 1114 08/31/17 1115  BP: 130/66   Pulse: 66 64  Resp: 16 15  Temp:    SpO2: 90% 90%    Last Pain:  Vitals:   08/31/17 1117  PainSc: 3                  Junior Huezo

## 2017-08-31 NOTE — Progress Notes (Signed)
Report given to angelo rn as caregiver

## 2017-08-31 NOTE — Transfer of Care (Signed)
Immediate Anesthesia Transfer of Care Note  Patient: Brian Graham  Procedure(s) Performed: AORTOGRAM BILATERAL PELVIC ANGIOGRAM WITH LEFT ILIAC ARTERY STENT (Right Groin)  Patient Location: PACU  Anesthesia Type:General  Level of Consciousness: awake, alert  and oriented  Airway & Oxygen Therapy: Patient Spontanous Breathing and Patient connected to nasal cannula oxygen  Post-op Assessment: Report given to RN and Post -op Vital signs reviewed and stable  Post vital signs: Reviewed and stable  Last Vitals:  Vitals:   08/31/17 0700  BP: (!) 155/78  Pulse: 76  Resp: 16  Temp: 36.8 C  SpO2: 95%    Last Pain:  Vitals:   08/31/17 0655  PainSc: 2       Patients Stated Pain Goal: 3 (47/65/46 5035)  Complications: No apparent anesthesia complications

## 2017-08-31 NOTE — Anesthesia Procedure Notes (Signed)
Procedure Name: Intubation Date/Time: 08/31/2017 8:36 AM Performed by: Neldon Newport Pre-anesthesia Checklist: Timeout performed, Patient being monitored, Suction available, Emergency Drugs available and Patient identified Patient Re-evaluated:Patient Re-evaluated prior to induction Oxygen Delivery Method: Circle system utilized Preoxygenation: Pre-oxygenation with 100% oxygen Induction Type: IV induction Ventilation: Oral airway inserted - appropriate to patient size and Mask ventilation without difficulty Laryngoscope Size: Mac and 3 Grade View: Grade I Tube type: Oral Tube size: 7.5 mm Number of attempts: 1 Placement Confirmation: breath sounds checked- equal and bilateral,  positive ETCO2 and ETT inserted through vocal cords under direct vision Secured at: 23 cm Tube secured with: Tape Dental Injury: Teeth and Oropharynx as per pre-operative assessment

## 2017-09-01 ENCOUNTER — Encounter (HOSPITAL_COMMUNITY): Payer: Self-pay | Admitting: Vascular Surgery

## 2017-09-02 ENCOUNTER — Telehealth: Payer: Self-pay | Admitting: Vascular Surgery

## 2017-09-02 NOTE — Telephone Encounter (Signed)
-----   Message from Mena Goes, RN sent at 08/31/2017 12:10 PM EDT ----- Regarding: 4 weeks w/ ABIs    ----- Message ----- From: Conrad Mayes, MD Sent: 08/31/2017  10:22 AM To: [facility] [address]  Brian Graham [id number] 1951/09/25  PROCEDURE: 1.  Right common femoral artery cannulation under ultrasound guidance 2.  Placement of catheter in aorta 3.  Aortogram 4.  Bilateral pelvic angiogram (Limited bilateral leg runoff) 5.  Angioplasty and stenting Left common iliac artery (iCAST 9 mm x 38 mm)  Follow-up: 4 weeks  Orders(s) for follow-up: BLE ABI

## 2017-09-02 NOTE — Telephone Encounter (Signed)
Sched appt 09/26/17 at 3:00 and MD 09/30/17 at 3:45. Hm# not going through, lm on cell#.

## 2017-09-03 ENCOUNTER — Ambulatory Visit (INDEPENDENT_AMBULATORY_CARE_PROVIDER_SITE_OTHER): Payer: Medicare Other | Admitting: Family Medicine

## 2017-09-03 ENCOUNTER — Encounter: Payer: Self-pay | Admitting: Family Medicine

## 2017-09-03 VITALS — BP 110/60 | HR 80 | Temp 98.3°F | Resp 14 | Ht 69.0 in | Wt 139.0 lb

## 2017-09-03 DIAGNOSIS — N390 Urinary tract infection, site not specified: Secondary | ICD-10-CM | POA: Diagnosis not present

## 2017-09-03 DIAGNOSIS — K5900 Constipation, unspecified: Secondary | ICD-10-CM | POA: Diagnosis not present

## 2017-09-03 DIAGNOSIS — I10 Essential (primary) hypertension: Secondary | ICD-10-CM | POA: Diagnosis not present

## 2017-09-03 DIAGNOSIS — R319 Hematuria, unspecified: Secondary | ICD-10-CM | POA: Diagnosis not present

## 2017-09-03 DIAGNOSIS — I700 Atherosclerosis of aorta: Secondary | ICD-10-CM

## 2017-09-03 HISTORY — DX: Urinary tract infection, site not specified: N39.0

## 2017-09-03 LAB — POCT URINALYSIS DIPSTICK
Bilirubin, UA: NEGATIVE
Glucose, UA: NEGATIVE
KETONES UA: NEGATIVE
Nitrite, UA: NEGATIVE
PH UA: 6 (ref 5.0–8.0)
SPEC GRAV UA: 1.015 (ref 1.010–1.025)
Urobilinogen, UA: 0.2 E.U./dL

## 2017-09-03 MED ORDER — SULFAMETHOXAZOLE-TRIMETHOPRIM 800-160 MG PO TABS: 1.0000 | ORAL_TABLET | Freq: Two times a day (BID) | ORAL | 0 refills | Status: DC

## 2017-09-03 NOTE — Assessment & Plan Note (Signed)
Underwent 1.  Right common femoral artery cannulation under ultrasound guidance 2.  Placement of catheter in aorta 3.  Aortogram 4.  Bilateral pelvic angiogram (Limited bilateral leg runoff) 5.  Angioplasty and stenting Left common iliac artery (iCAST 9 mm x 38 mm) A couple of days ago with Dr Bridgett Larsson, so if his symptoms worsen or new symptoms develop they are encouraged to proceed to ER

## 2017-09-03 NOTE — Progress Notes (Signed)
Patient ID: Brian Graham, male   DOB: 1951-07-20, 66 y.o.   MRN: [id number]   Subjective:    Patient ID: Brian Graham, male    DOB: February 13, 1951, 66 y.o.   MRN: [id number]  Chief Complaint  Patient presents with  . Urinary Tract Infection    HPI Patient is in today for evaluation of recurrent urinary tract symptoms. He has a long history of urinary tract infections and is following closely with Dr Tresa Moore at [facility]. He has been treated intermittently by his PMD with Augmentin to hold him over while he develops a plan with urology. The augmentin has not been holding off his UTIs as well as it did in the past and over past 2 days has had chills with urinary frequency, urgency and discomfort. Of note he also under went a vascular procedure with his vascular1.  Right common femoral artery cannulation under ultrasound guidance 2.  Placement of catheter in aorta 3.  Aortogram 4.  Bilateral pelvic angiogram (Limited bilateral leg runoff) 5.  Angioplasty and stenting Left common iliac artery (iCAST 9 mm x 38 mm). He denies any abdominal pain but does note he has not moved his bowels in roughly 4 days since priro to the procedure which is unusual for him. Denies CP/palp/SOB/HA/congestion/fevers. Taking meds as prescribed  Past Medical History:  Diagnosis Date  . Anxiety   . Arthritis    "left knee" (08/05/2016)  . Bruises easily   . CAD (coronary artery disease) of artery bypass graft 08/05/2016   Around age 1. CABG - 3 vessel.   . Chronic kidney disease   . Colon polyp   . COPD, severe ([place]) 08/23/2016   Alpha 1 studies normal per prior pulmonary group notes Smoked 40 years, quit in 2012 June 2016 PFT from prior pulmonary group: "significant obstruction" FEV1 1.58L (47% pred), Residual volume 171% pred, DLCO 59% pred Simple Spirometry>> 09/15/2016 ratio 42% FEV1 1.02 L / 31%   . Essential tremor 06/11/2016   Plans to see Dr. Carles Collet  . Former smoker 09/01/2016   Quit 2012. 40 pack  years at least.   . GERD (gastroesophageal reflux disease)   . Headache   . History of blood transfusion 08/1997   "when he had his heart surgery"  . History of shingles 1970-2013 X 3  . HTN (hypertension) 08/05/2016   Amlodipine 5mg , atenolol 50mg  BID, benazepril 20mg   . Hyperlipemia   . Hypothyroidism   . Lumbar disc disease   . Myocardial infarction ([place]) 08/1997  . Peripheral vascular disease ([place])   . PONV (postoperative nausea and vomiting)   . Urinary tract infection 09/03/2017  . Wears glasses     Past Surgical History:  Procedure Laterality Date  . AORTOGRAM Right 08/31/2017   Procedure: AORTOGRAM BILATERAL PELVIC ANGIOGRAM WITH LEFT ILIAC ARTERY STENT;  Surgeon: Conrad [place] Payne, MD;  Location: [place];  Service: Vascular;  Laterality: Right;  . BACK SURGERY    . CARDIAC CATHETERIZATION  08/1997  . CORONARY ARTERY BYPASS GRAFT  09/12/1997   "triple"  . INGUINAL HERNIA REPAIR Right 1961  . KNEE RECONSTRUCTION Left 1960s - 1974 X 4  . LAPAROSCOPIC ABLATION RENAL MASS  ~ 2014   "large abscess/pus ball"  . [place]   "they were wedged in the bowel"  . PATELLA FRACTURE SURGERY Left 1963  . PATELLA FRACTURE SURGERY Left ~ 1965   "removed knee cap"  . TONSILLECTOMY      Family  History  Problem Relation Age of Onset  . Alcohol abuse Mother   . Alcohol abuse Father        not involved in life  . Alcoholism Sister   . Healthy Sister   . Healthy Sister     Social History   Social History  . Marital status: Married    Spouse name: N/A  . Number of children: N/A  . Years of education: N/A   Occupational History  . Not on file.   Social History Main Topics  . Smoking status: Former Smoker    Packs/day: 1.00    Years: 45.00    Types: Cigarettes    Quit date: 07/10/2017  . Smokeless tobacco: Never Used  . Alcohol use 8.4 oz/week    14 Shots of liquor per week     Comment: daily 2 shots of vodka  . Drug use: No  . Sexual activity: Not  Currently   Other Topics Concern  . Not on file   Social History Narrative   Lives with wife. 2 adopted daughters from [place].       Retired from [employer] Medications Prior to Visit  Medication Sig Dispense Refill  . acetaminophen (TYLENOL) 500 MG tablet Take 1,000 mg by mouth every 6 (six) hours as needed for mild pain.    Marland Kitchen albuterol (PROVENTIL HFA;VENTOLIN HFA) 108 (90 Base) MCG/ACT inhaler Inhale 2 puffs into the lungs every 6 (six) hours as needed for wheezing or shortness of breath.    Marland Kitchen albuterol (PROVENTIL) (2.5 MG/3ML) 0.083% nebulizer solution Take 3 mLs (2.5 mg total) by nebulization every 6 (six) hours as needed for wheezing or shortness of breath. 360 mL 11  . amLODipine (NORVASC) 5 MG tablet Take 1 tablet (5 mg total) by mouth daily. 90 tablet 3  . aspirin 81 MG EC tablet Take 1 tablet (81 mg total) by mouth daily. 120 tablet 3  . atenolol (TENORMIN) 50 MG tablet Take 1 tablet (50 mg total) by mouth daily. 90 tablet 3  . atorvastatin (LIPITOR) 80 MG tablet Take 80 mg by mouth every evening.     . benazepril (LOTENSIN) 20 MG tablet Take 20 mg by mouth daily.     . budesonide-formoterol (SYMBICORT) 160-4.5 MCG/ACT inhaler Inhale 2 puffs into the lungs 2 (two) times daily.    . clopidogrel (PLAVIX) 75 MG tablet Take 1 tablet (75 mg total) by mouth daily. 30 tablet 11  . nicotine (NICODERM CQ - DOSED IN MG/24 HOURS) 14 mg/24hr patch Place 14 mg onto the skin daily.    . ondansetron (ZOFRAN ODT) 4 MG disintegrating tablet Allow 1-2 tablets to dissolve in your mouth every 8 hours as needed for nausea/vomiting (Patient taking differently: Take 4 mg by mouth every 8 (eight) hours as needed for nausea or vomiting. ) 30 tablet 0  . PROAIR HFA 108 (90 Base) MCG/ACT inhaler Inhale 1-2 puffs into the lungs every 4 (four) hours as needed. (Patient taking differently: Inhale 2 puffs into the lungs every 4 (four) hours as needed for wheezing or shortness of breath. ) 3 Inhaler 1    . sertraline (ZOLOFT) 25 MG tablet Take 1 tablet (25 mg total) by mouth daily. 30 tablet 5  . SYNTHROID 125 MCG tablet Take 125 mcg by mouth daily before breakfast.     . tamsulosin (FLOMAX) 0.4 MG CAPS capsule Take 0.4 mg by mouth daily as needed (urinary issues).    . Tiotropium Bromide Monohydrate (SPIRIVA  RESPIMAT) 2.5 MCG/ACT AERS Inhale 2 puffs into the lungs daily. 3 Inhaler 3   No facility-administered medications prior to visit.     Allergies  Allergen Reactions  . Keflex [Cephalexin] Diarrhea, Nausea Only and Other (See Comments)    Insomnia  . Ciprofloxacin Diarrhea  . Levaquin [Levofloxacin] Other (See Comments)    insomnia  . Omeprazole Diarrhea    Review of Systems  Constitutional: Positive for chills and malaise/fatigue. Negative for fever.  HENT: Negative for congestion.   Eyes: Negative for blurred vision.  Respiratory: Negative for shortness of breath.   Cardiovascular: Negative for chest pain, palpitations and leg swelling.  Gastrointestinal: Positive for constipation. Negative for abdominal pain, blood in stool and nausea.  Genitourinary: Positive for dysuria, frequency and urgency. Negative for hematuria.  Musculoskeletal: Negative for falls.  Skin: Negative for rash.  Neurological: Negative for dizziness, loss of consciousness and headaches.  Endo/Heme/Allergies: Negative for environmental allergies.  Psychiatric/Behavioral: Negative for depression. The patient is not nervous/anxious.        Objective:    Physical Exam  Constitutional: He is oriented to person, place, and time. He appears well-developed and well-nourished. No distress.  HENT:  Head: Normocephalic and atraumatic.  Nose: Nose normal.  Eyes: Right eye exhibits no discharge. Left eye exhibits no discharge.  Neck: Normal range of motion. Neck supple.  Cardiovascular: Normal rate and regular rhythm.   No murmur heard. Pulmonary/Chest: Effort normal and breath sounds normal.  Abdominal:  Soft. Bowel sounds are normal. There is no tenderness.  Musculoskeletal: He exhibits no edema.  Neurological: He is alert and oriented to person, place, and time.  Skin: Skin is warm and dry.  Psychiatric: He has a normal mood and affect.  Nursing note and vitals reviewed.   BP 110/60   Pulse 80   Temp 98.3 F (36.8 C) (Oral)   Resp 14   Ht 5\' 9"  (1.753 m)   Wt 139 lb (63 kg)   SpO2 96%   BMI 20.53 kg/m  Wt Readings from Last 3 Encounters:  09/03/17 139 lb (63 kg)  08/31/17 140 lb (63.5 kg)  08/13/17 140 lb (63.5 kg)     Lab Results  Component Value Date   WBC 10.5 08/13/2017   HGB 13.6 08/31/2017   HCT 40.0 08/31/2017   PLT 255 08/13/2017   GLUCOSE 104 (H) 08/31/2017   CHOL 141 05/06/2017   TRIG 88 05/06/2017   HDL 55 05/06/2017   LDLCALC 68 05/06/2017   ALT 20 08/13/2017   AST 24 08/13/2017   NA 138 08/31/2017   K 4.4 08/31/2017   CL 99 (L) 08/31/2017   CREATININE 1.10 08/31/2017   BUN 11 08/31/2017   CO2 27 08/13/2017   TSH 3.42 04/22/2017   HGBA1C 6.1 04/22/2017    Lab Results  Component Value Date   TSH 3.42 04/22/2017   Lab Results  Component Value Date   WBC 10.5 08/13/2017   HGB 13.6 08/31/2017   HCT 40.0 08/31/2017   MCV 93.8 08/13/2017   PLT 255 08/13/2017   Lab Results  Component Value Date   NA 138 08/31/2017   K 4.4 08/31/2017   CO2 27 08/13/2017   GLUCOSE 104 (H) 08/31/2017   BUN 11 08/31/2017   CREATININE 1.10 08/31/2017   BILITOT 0.8 08/13/2017   ALKPHOS 75 08/13/2017   AST 24 08/13/2017   ALT 20 08/13/2017   PROT 7.2 08/13/2017   ALBUMIN 4.0 08/13/2017   CALCIUM 8.9 08/13/2017  ANIONGAP 9 08/13/2017   Lab Results  Component Value Date   CHOL 141 05/06/2017   Lab Results  Component Value Date   HDL 55 05/06/2017   Lab Results  Component Value Date   LDLCALC 68 05/06/2017   Lab Results  Component Value Date   TRIG 88 05/06/2017   Lab Results  Component Value Date   CHOLHDL 2.6 05/06/2017   Lab Results    Component Value Date   HGBA1C 6.1 04/22/2017       Assessment & Plan:   Problem List Items Addressed This Visit    Essential hypertension    Well controlled, no changes to meds. Encouraged heart healthy diet such as the DASH diet and exercise as tolerated.       Aortic atherosclerosis (HCC)    Underwent 1.  Right common femoral artery cannulation under ultrasound guidance 2.  Placement of catheter in aorta 3.  Aortogram 4.  Bilateral pelvic angiogram (Limited bilateral leg runoff) 5.  Angioplasty and stenting Left common iliac artery (iCAST 9 mm x 38 mm) A couple of days ago with Dr Bridgett Larsson, so if his symptoms worsen or new symptoms develop they are encouraged to proceed to ER      Urinary tract infection    Symptoms worsening over past 48 hours and patient with recurrent UTI. Follows closely Dr Tresa Moore at [facility]rology and they are considering surgical removal of one of his kidneys. Has not been getting ongoing relief with Augmentin and suffers from allergies to other antibiotics. Will give a course of Bactrim and he sees his urologist later this month. Reminded to hydrate well and seek care if worsens.      Relevant Medications   sulfamethoxazole-trimethoprim (BACTRIM DS,SEPTRA DS) 800-160 MG tablet   Other Relevant Orders   Urine Culture   Urine Culture   Constipation    Has not moved his bowels in roughly 4 days since before his vascular procedure  Encouraged increased hydration and fiber in diet. Daily probiotics. If bowels not moving can use MOM 2 tbls po in 4 oz of warm prune juice by mouth every 2-3 days. If no results then repeat in 4 hours with  Dulcolax suppository pr, may repeat again in 4 more hours as needed. Seek care if symptoms worsen. Consider daily Miralax and/or Dulcolax if symptoms persist.        Other Visit Diagnoses    Hematuria, unspecified type    -  Primary   Relevant Orders   POCT Urinalysis Dipstick (Completed)      I am having Mr. Markuson start  on sulfamethoxazole-trimethoprim. I am also having him maintain his atorvastatin, benazepril, SYNTHROID, acetaminophen, aspirin, budesonide-formoterol, albuterol, amLODipine, PROAIR HFA, Tiotropium Bromide Monohydrate, sertraline, tamsulosin, nicotine, ondansetron, albuterol, atenolol, and clopidogrel.  Meds ordered this encounter  Medications  . sulfamethoxazole-trimethoprim (BACTRIM DS,SEPTRA DS) 800-160 MG tablet    Sig: Take 1 tablet by mouth 2 (two) times daily.    Dispense:  20 tablet    Refill:  0     Penni Homans, MD

## 2017-09-03 NOTE — Assessment & Plan Note (Signed)
Well controlled, no changes to meds. Encouraged heart healthy diet such as the DASH diet and exercise as tolerated.  °

## 2017-09-03 NOTE — Assessment & Plan Note (Signed)
Symptoms worsening over past 48 hours and patient with recurrent UTI. Follows closely Dr Tresa Moore at [facility] urology and they are considering surgical removal of one of his kidneys. Has not been getting ongoing relief with Augmentin and suffers from allergies to other antibiotics. Will give a course of Bactrim and he sees his urologist later this month. Reminded to hydrate well and seek care if worsens.

## 2017-09-03 NOTE — Assessment & Plan Note (Signed)
Has not moved his bowels in roughly 4 days since before his vascular procedure  Encouraged increased hydration and fiber in diet. Daily probiotics. If bowels not moving can use MOM 2 tbls po in 4 oz of warm prune juice by mouth every 2-3 days. If no results then repeat in 4 hours with  Dulcolax suppository pr, may repeat again in 4 more hours as needed. Seek care if symptoms worsen. Consider daily Miralax and/or Dulcolax if symptoms persist.

## 2017-09-03 NOTE — Patient Instructions (Addendum)
Encouraged increased hydration and fiber in diet. Daily probiotics. If bowels not moving can use MOM 2 tbls po in 4 oz of warm prune juice by mouth every 2-3 days. If no results then repeat in 4 hours with  Dulcolax suppository pr, may repeat again in 4 more hours as needed. Seek care if symptoms worsen. Consider daily Miralax and/or Dulcolax if symptoms persist.  Magnesium Citrate is the next step    Urinary Tract Infection, Adult A urinary tract infection (UTI) is an infection of any part of the urinary tract, which includes the kidneys, ureters, bladder, and urethra. These organs make, store, and get rid of urine in the body. UTI can be a bladder infection (cystitis) or kidney infection (pyelonephritis). What are the causes? This infection may be caused by fungi, viruses, or bacteria. Bacteria are the most common cause of UTIs. This condition can also be caused by repeated incomplete emptying of the bladder during urination. What increases the risk? This condition is more likely to develop if:  You ignore your need to urinate or hold urine for long periods of time.  You do not empty your bladder completely during urination.  You wipe back to front after urinating or having a bowel movement, if you are male.  You are uncircumcised, if you are male.  You are constipated.  You have a urinary catheter that stays in place (indwelling).  You have a weak defense (immune) system.  You have a medical condition that affects your bowels, kidneys, or bladder.  You have diabetes.  You take antibiotic medicines frequently or for long periods of time, and the antibiotics no longer work well against certain types of infections (antibiotic resistance).  You take medicines that irritate your urinary tract.  You are exposed to chemicals that irritate your urinary tract.  You are male.  What are the signs or symptoms? Symptoms of this condition include:  Fever.  Frequent urination or  passing small amounts of urine frequently.  Needing to urinate urgently.  Pain or burning with urination.  Urine that smells bad or unusual.  Cloudy urine.  Pain in the lower abdomen or back.  Trouble urinating.  Blood in the urine.  Vomiting or being less hungry than normal.  Diarrhea or abdominal pain.  Vaginal discharge, if you are male.  How is this diagnosed? This condition is diagnosed with a medical history and physical exam. You will also need to provide a urine sample to test your urine. Other tests may be done, including:  Blood tests.  Sexually transmitted disease (STD) testing.  If you have had more than one UTI, a cystoscopy or imaging studies may be done to determine the cause of the infections. How is this treated? Treatment for this condition often includes a combination of two or more of the following:  Antibiotic medicine.  Other medicines to treat less common causes of UTI.  Over-the-counter medicines to treat pain.  Drinking enough water to stay hydrated.  Follow these instructions at home:  Take over-the-counter and prescription medicines only as told by your health care provider.  If you were prescribed an antibiotic, take it as told by your health care provider. Do not stop taking the antibiotic even if you start to feel better.  Avoid alcohol, caffeine, tea, and carbonated beverages. They can irritate your bladder.  Drink enough fluid to keep your urine clear or pale yellow.  Keep all follow-up visits as told by your health care provider. This is important.  Make sure to: ? Empty your bladder often and completely. Do not hold urine for long periods of time. ? Empty your bladder before and after sex. ? Wipe from front to back after a bowel movement if you are male. Use each tissue one time when you wipe. Contact a health care provider if:  You have back pain.  You have a fever.  You feel nauseous or vomit.  Your symptoms do  not get better after 3 days.  Your symptoms go away and then return. Get help right away if:  You have severe back pain or lower abdominal pain.  You are vomiting and cannot keep down any medicines or water. This information is not intended to replace advice given to you by your health care provider. Make sure you discuss any questions you have with your health care provider. Document Released: 08/18/2005 Document Revised: 04/21/2016 Document Reviewed: 09/29/2015 Elsevier Interactive Patient Education  2017 [employer].

## 2017-09-03 NOTE — Progress Notes (Signed)
Pre visit review using our clinic review tool, if applicable. No additional management support is needed unless otherwise documented below in the visit note. 

## 2017-09-04 LAB — URINE CULTURE: CULTURE: NO GROWTH

## 2017-09-05 ENCOUNTER — Telehealth: Payer: Self-pay | Admitting: *Deleted

## 2017-09-05 NOTE — Telephone Encounter (Signed)
PLEASE NOTE: All timestamps contained within this report are represented as [place] Standard Time. CONFIDENTIALTY NOTICE: This fax transmission is intended only for the addressee. It contains information that is legally privileged, confidential or otherwise protected from use or disclosure. If you are not the intended recipient, you are strictly prohibited from reviewing, disclosing, copying using or disseminating any of this information or taking any action in reliance on or regarding this information. If you have received this fax in error, please notify us immediately by telephone so that we can arrange for its return to [place]. Phone: [phone], Toll-Free: [phone], Fax: [phone] Page: 1 of 2 Call Id: [id number] [place] [facility] [place] Patient Name: Brian Graham Gender: Male DOB: 06-Apr-1951 Age: 66 Y 2 M 8 D Return Phone Number: [phone] (Primary) Address[place] [address] [place] [facility] [place] Client Site [place] [facility] [place] Contact Type Call Who Is Calling Patient / Member / Family / Caregiver Call Type Triage / Clinical Relationship To Patient Self Return Phone Number [phone] (Primary) Chief Complaint Urine, Blood In Reason for Call Symptomatic / Request for [place] thinks he has a UTI has blood in urine and its cloudy wants to if he can get something for it Dr.Hunter Translation No Nurse Assessment Nurse: Mancel Bale, RN, Arita Miss Date/Time (Eastern Time): 09/03/2017 10:49:37 AM Confirm and document reason for call. If symptomatic, describe symptoms. ---Caller thinks he has a UTI; he has blood in urine and its cloudy Does the patient have any new or worsening symptoms? ---Yes Will a triage be completed? ---Yes Related visit to physician within the last 2 weeks?  ---N/A Does the PT have any chronic conditions? (i.e. diabetes, asthma, etc.) ---Yes List chronic conditions. ---due to have kidney removed soon; angiogram on Wednesday; pt is on Palvix for 30 days Is this a behavioral health or substance abuse call? ---No Guidelines Guideline Title Affirmed Question Affirmed Notes Nurse Date/Time Eilene Ghazi Time) Urine - Blood In Taking Coumadin (warfarin) or other strong blood thinner, or known bleeding disorder (e.g., thrombocytopenia) Mancel Bale, RN, [facility] 09/03/2017 10:51:45 AM Disp. Time Eilene Ghazi Time) Disposition Final User 09/03/2017 10:56:17 AM See Physician within 4 Hours (or PCP triage) Yes Mancel Bale, RN, Arita Miss PLEASE NOTE: All timestamps contained within this report are represented as [place] Standard Time. CONFIDENTIALTY NOTICE: This fax transmission is intended only for the addressee. It contains information that is legally privileged, confidential or otherwise protected from use or disclosure. If you are not the intended recipient, you are strictly prohibited from reviewing, disclosing, copying using or disseminating any of this information or taking any action in reliance on or regarding this information. If you have received this fax in error, please notify us immediately by telephone so that we can arrange for its return to [place]. Phone: [phone], Toll-Free: [phone], Fax: [phone] Page: 2 of 2 Call Id: [id number] [place] Disagree/Comply Comply Caller Understands Yes PreDisposition InappropriateToAsk Care Advice Given Per Guideline SEE PHYSICIAN WITHIN 4 HOURS (or PCP triage): * IF OFFICE WILL BE CLOSED AND NO PCP TRIAGE: You need to be seen within the next 3 or 4 hours. A nearby Urgent Care Center is often a good source of care. Another choice is to go to the ER. Go sooner if you become worse. BRING MEDICINES: * Please bring a list of your current medicines when you go to see the doctor. * It is  also a good idea to bring the  pill bottles too. This will help the doctor to make certain you are taking the right medicines and the right dose. CALL BACK IF: * Fever occurs * You become worse. CARE ADVICE given per Urine, Blood In (Adult) guideline. Referrals [facility]

## 2017-09-06 ENCOUNTER — Other Ambulatory Visit: Payer: Self-pay

## 2017-09-06 DIAGNOSIS — Z48812 Encounter for surgical aftercare following surgery on the circulatory system: Secondary | ICD-10-CM

## 2017-09-09 ENCOUNTER — Telehealth: Payer: Self-pay | Admitting: Family Medicine

## 2017-09-09 ENCOUNTER — Other Ambulatory Visit: Payer: Self-pay

## 2017-09-09 MED ORDER — ONDANSETRON 4 MG PO TBDP: ORAL_TABLET | ORAL | 0 refills | Status: DC

## 2017-09-09 NOTE — Telephone Encounter (Signed)
MEDICATION:  ondansetron (ZOFRAN ODT) 4 MG disintegrating tablet PHARMACY:   [id number] [place], [place] [phone] (Phone) [phone] (Fax)    IS THIS A 90 DAY SUPPLY : N  IS PATIENT OUT OF MEDICATION: N  IF NOT; HOW MUCH IS LEFT: 2 pills   LAST APPOINTMENT DATE: @10 /15/2018  NEXT APPOINTMENT DATE:@Visit  date not found  OTHER COMMENTS: Patient called in reference to needing ondansetron (ZOFRAN ODT) 4 MG disintegrating tablet refilled. Patient stated this was originally filled by hospital. Please call patient and advise if unable to refill.     **Let patient know to contact pharmacy at the end of the day to make sure medication is ready. **  ** Please notify patient to allow 48-72 hours to process**  **Encourage patient to contact the pharmacy for refills or they can request refills through [facility]**

## 2017-09-09 NOTE — Telephone Encounter (Signed)
Prescription sent to pharmacy as requested.

## 2017-09-20 ENCOUNTER — Other Ambulatory Visit: Payer: Self-pay | Admitting: Urology

## 2017-09-21 ENCOUNTER — Telehealth: Payer: Self-pay | Admitting: Family Medicine

## 2017-09-21 ENCOUNTER — Ambulatory Visit (INDEPENDENT_AMBULATORY_CARE_PROVIDER_SITE_OTHER): Payer: Medicare Other | Admitting: Family Medicine

## 2017-09-21 ENCOUNTER — Encounter: Payer: Self-pay | Admitting: Family Medicine

## 2017-09-21 VITALS — BP 140/80 | HR 76 | Ht 69.0 in | Wt 141.6 lb

## 2017-09-21 DIAGNOSIS — J441 Chronic obstructive pulmonary disease with (acute) exacerbation: Secondary | ICD-10-CM

## 2017-09-21 MED ORDER — PREDNISONE 50 MG PO TABS: ORAL_TABLET | ORAL | 0 refills | Status: DC

## 2017-09-21 MED ORDER — BENZONATATE 200 MG PO CAPS: 200.0000 mg | ORAL_CAPSULE | Freq: Two times a day (BID) | ORAL | 0 refills | Status: DC | PRN

## 2017-09-21 MED ORDER — AZITHROMYCIN 250 MG PO TABS: ORAL_TABLET | ORAL | 0 refills | Status: DC

## 2017-09-21 MED ORDER — PULSE OXIMETER MISC: 1.0000 "application " | 0 refills | Status: DC | PRN

## 2017-09-21 NOTE — Progress Notes (Signed)
  Cough Subjective:  Brian Graham is a 66 y.o. male who presents today with a chief complaint of cough.   HPI:  Productive cough, acute issue Symptoms started about a day and a half ago.  Has worsened over that time.  Associated symptoms include some wheezing and shortness of breath.  Cough is productive of thick green mucus.  He has been compliant with his COPD inhalers.  He tried albuterol this morning which helped a little bit.  No other treatments tried.  Shortness of breath and cough is worse with activity.  Has not noticed anything else that makes symptoms better.  He does have oxygen at home, however he is not on this routinely.  No fevers or chills.  No chest pain.  No sick contacts.  ROS: Per HPI  PMH: Smoking history reviewed.  Former smoker.  Objective:  Physical Exam: BP 140/80   Pulse 76   Ht 5\' 9"  (1.753 m)   Wt 141 lb 9.6 oz (64.2 kg)   SpO2 97% Comment: with oxygen  BMI 20.91 kg/m   Gen: NAD, resting comfortably CV: RRR with no murmurs appreciated Pulm: Normal work of breathing.  Good airflow.  Slight end expiratory wheeze noted at bases.  No crackles or rhonchi. MSK: No cyanosis or edema  Assessment/Plan:  COPD exacerbation Start Z-Pak and prednisone.  Also advised patient to use albuterol inhaler every 4-6 hours over the next couple of days.  Advised patient to start his supplemental home oxygen to keep his O2 sats above 90%.  Continue his current dose of other inhalers.  His respiratory exam today is relatively benign - do not think he needs intensive monitoring or treatment at this point.  Discussed strict return precautions including worsening shortness of breath, worsening O2 sats, chest pain, fever,  etc.   [name], MD 09/21/2017 11:24 AM

## 2017-09-21 NOTE — Telephone Encounter (Signed)
Patient is seeing Dr. Jerline Pain today at 10:40am for chest cold and coughing up phelm.

## 2017-09-21 NOTE — Patient Instructions (Signed)
Start the Z-Pak and prednisone.  Use Tessalon as needed for cough.  Usual albuterol inhaler every 4-6 hours over the next couple of days.  Your oxygen level should be 90% or greater.  Please let us know if you are having to use more than 2 L of oxygen at the time.  If your symptoms are worsening or not improving in the next couple of days please come back to see me or Dr. [name].  Take care, Dr Jerline Pain

## 2017-09-21 NOTE — Telephone Encounter (Signed)
FYI, pt coming in today for [employer].

## 2017-09-21 NOTE — Telephone Encounter (Signed)
Noted  

## 2017-09-26 ENCOUNTER — Ambulatory Visit (HOSPITAL_COMMUNITY)
Admission: RE | Admit: 2017-09-26 | Discharge: 2017-09-26 | Disposition: A | Discharge status: Home / Self Care | Payer: Medicare Other | Source: Ambulatory Visit | Attending: Surgery | Admitting: Surgery

## 2017-09-26 DIAGNOSIS — Z48812 Encounter for surgical aftercare following surgery on the circulatory system: Secondary | ICD-10-CM | POA: Diagnosis not present

## 2017-09-27 ENCOUNTER — Telehealth: Payer: Self-pay | Admitting: Pulmonary Disease

## 2017-09-27 MED ORDER — BUDESONIDE-FORMOTEROL FUMARATE 160-4.5 MCG/ACT IN AERO: 2.0000 | INHALATION_SPRAY | Freq: Two times a day (BID) | RESPIRATORY_TRACT | 1 refills | Status: DC

## 2017-09-27 NOTE — Telephone Encounter (Signed)
Spoke with pt. He is needing a refill on Symbicort. This has been sent to his preferred pharmacy. Nothing further was needed.

## 2017-09-28 NOTE — Progress Notes (Signed)
Postoperative Visit ([place])   History of Present Illness   Brian Graham is a 66 y.o. male who presents cc:  Resolution of L hip pain.  Prior procedure include: 1.  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  The patient is able to complete their activities of daily living.  The patient's current symptoms are: none.  This patient is scheduled for L pelvic nephrectomy this coming Friday.  Past Medical History, Past Surgical History, Social History, Family History, Medications, Allergies, and Review of Systems are unchanged from previous evaluation on 08/31/17.  Current Outpatient Medications  Medication Sig Dispense Refill  . acetaminophen (TYLENOL) 500 MG tablet Take 1,000 mg by mouth every 6 (six) hours as needed for mild pain.    Marland Kitchen albuterol (PROVENTIL HFA;VENTOLIN HFA) 108 (90 Base) MCG/ACT inhaler Inhale 2 puffs into the lungs every 6 (six) hours as needed for wheezing or shortness of breath.    Marland Kitchen albuterol (PROVENTIL) (2.5 MG/3ML) 0.083% nebulizer solution Take 3 mLs (2.5 mg total) by nebulization every 6 (six) hours as needed for wheezing or shortness of breath. 360 mL 11  . amLODipine (NORVASC) 5 MG tablet Take 1 tablet (5 mg total) by mouth daily. 90 tablet 3  . aspirin 81 MG EC tablet Take 1 tablet (81 mg total) by mouth daily. 120 tablet 3  . atenolol (TENORMIN) 50 MG tablet Take 1 tablet (50 mg total) by mouth daily. 90 tablet 3  . atorvastatin (LIPITOR) 80 MG tablet Take 80 mg by mouth every evening.     . benazepril (LOTENSIN) 20 MG tablet Take 20 mg by mouth daily.     . benzonatate (TESSALON) 200 MG capsule Take 1 capsule (200 mg total) by mouth 2 (two) times daily as needed for cough. (Patient not taking: Reported on 09/26/2017) 20 capsule 0  . budesonide-formoterol (SYMBICORT) 160-4.5 MCG/ACT inhaler Inhale 2 puffs 2 (two) times daily into the lungs. 3 Inhaler 1  . clopidogrel (PLAVIX) 75 MG  tablet Take 1 tablet (75 mg total) by mouth daily. (Patient not taking: Reported on 09/26/2017) 30 tablet 11  . Misc. Devices (PULSE OXIMETER) MISC 1 application by Does not apply route as needed. (Patient not taking: Reported on 09/26/2017) 1 each 0  . nicotine (NICODERM CQ - DOSED IN MG/24 HOURS) 14 mg/24hr patch Place 14 mg onto the skin daily.    . ondansetron (ZOFRAN ODT) 4 MG disintegrating tablet Allow 1-2 tablets to dissolve in your mouth every 8 hours as needed for nausea/vomiting 30 tablet 0  . sertraline (ZOLOFT) 25 MG tablet Take 1 tablet (25 mg total) by mouth daily. (Patient not taking: Reported on 09/26/2017) 30 tablet 5  . SYNTHROID 125 MCG tablet Take 125 mcg by mouth daily before breakfast.     . tamsulosin (FLOMAX) 0.4 MG CAPS capsule Take 0.4 mg by mouth daily as needed (urinary issues).    . Tiotropium Bromide Monohydrate (SPIRIVA RESPIMAT) 2.5 MCG/ACT AERS Inhale 2 puffs into the lungs daily. 3 Inhaler 3   No current facility-administered medications for this visit.     ROS: no rest pain, no intermittent claudication    For VQI Use Only   PRE-ADM LIVING: Home  AMB STATUS: Ambulatory   Physical Examination   Vitals:   09/30/17 1614  BP: 125/76  Pulse: 66  Resp: 18  Temp: 98.5 F (36.9 C)  TempSrc: Oral  SpO2: 95%  Weight: 141 lb (  64 kg)  Height: 5\' 9"  (1.753 m)   Body mass index is 20.82 kg/m.  General Alert, O x 3, WD, NAD  Pulmonary Sym exp, good B air movt, CTA B  Cardiac RRR, Nl S1, S2, no Murmurs, No rubs, No S3,S4  Vascular Vessel Right Left  Radial Palpable Palpable  Brachial Palpable Palpable  Carotid Palpable, No Bruit Palpable, No Bruit  Aorta Not palpable N/A  Femoral Palpable Palpable  Popliteal Not palpable Not palpable  PT Palpable Palpable  DP Palpable Palpable    Gastrointestinal soft, non-distended, non-tender to palpation, No guarding or rebound, no HSM, no masses, no CVAT B, No palpable prominent aortic pulse,      Musculoskeletal M/S 5/5 throughout  , Extremities without ischemic changes  , No edema present,  Neurologic  Pain and light touch intact in extremities , Motor exam as listed above     Non-invasive Vascular Imaging   ABI (09/26/17)  R:   ABI: 1.11 (1.01),   PT: tri  DP: tri  TBI:  0.72  L:   ABI: 1.07 (0.68),   PT: tri  DP: tri  TBI: 0.68   Medical Decision Making   Brian Graham is a 66 y.o. male who presents s/p L CIA PTA+S for L CIA occlusion.  Based on his angiographic findings, this patient needs: q3 month aortoiliac duplex and BLE ABI. I discussed in depth with the patient the nature of atherosclerosis, and emphasized the importance of maximal medical management including strict control of blood pressure, blood glucose, and lipid levels, obtaining regular exercise, and cessation of smoking.  The patient is aware that without maximal medical management the underlying atherosclerotic disease process will progress, limiting the benefit of any interventions. The patient is currently on a statin: Lipitor.  The patient is currently on an anti-platelet: ASA.  Thank you for allowing [place] to participate in this patient's care.   Adele Barthel, MD, FACS [facility] Office: [phone] Pager: [phone]

## 2017-09-30 ENCOUNTER — Ambulatory Visit: Payer: Medicare Other | Admitting: Vascular Surgery

## 2017-09-30 ENCOUNTER — Encounter: Payer: Self-pay | Admitting: Vascular Surgery

## 2017-09-30 VITALS — BP 125/76 | HR 66 | Temp 98.5°F | Resp 18 | Ht 69.0 in | Wt 141.0 lb

## 2017-09-30 DIAGNOSIS — I745 Embolism and thrombosis of iliac artery: Secondary | ICD-10-CM | POA: Diagnosis not present

## 2017-09-30 NOTE — Progress Notes (Signed)
LVM with Selita at [facility] regarding any clearances for surgery and to please fax them to [phone].

## 2017-09-30 NOTE — Patient Instructions (Addendum)
[facility]  09/30/2017   Your procedure is scheduled on: 10-07-17  Report to [facility] Main  Entrance  Take [place]  elevators to 3rd floor to  [place] at   0900 AM.    Call this number if you have problems the morning of surgery [phone]    Remember: ONLY 1 PERSON MAY GO WITH YOU TO SHORT STAY TO GET  READY MORNING OF YOUR SURGERY               FOLLOW A CLEAR LIQUID DIET THE DAY BEFORE SURGERY AND DRINK ONE BOTTLE OF MAGNESIUM CIT[place] BY NOON THE [place] not eat food or drink liquids :After Midnight.     Take these medicines the morning of surgery with A SIP OF WATER: inhalers and bring, synthroid, atenelol, amlodipine                                You may not have any metal on your body including hair pins and              piercings  Do not wear jewelry,  lotions, powders or perfumes, deodorant                       Men may shave face and neck.   Do not bring valuables to the hospital. [place].  Contacts, dentures or bridgework may not be worn into surgery.  Leave suitcase in the car. After surgery it may be brought to your room.                Please read over the following fact sheets you were given: _____________________________________________________________________          [facility] - Preparing for Surgery Before surgery, you can play an important role.  Because skin is not sterile, your skin needs to be as free of germs as possible.  You can reduce the number of germs on your skin by washing with CHG (chlorahexidine gluconate) soap before surgery.  CHG is an antiseptic cleaner which kills germs and bonds with the skin to continue killing germs even after washing. Please DO NOT use if you have an allergy to CHG or antibacterial soaps.  If your skin becomes reddened/irritated stop using the CHG and inform your nurse when you arrive at  Short Stay. Do not shave (including legs and underarms) for at least 48 hours prior to the first CHG shower.  You may shave your face/neck. Please follow these instructions carefully:  1.  Shower with CHG Soap the night before surgery and the  morning of Surgery.  2.  If you choose to wash your hair, wash your hair first as usual with your  normal  shampoo.  3.  After you shampoo, rinse your hair and body thoroughly to remove the  shampoo.                           4.  Use CHG as you would any other liquid soap.  You  can apply chg directly  to the skin and wash                       Gently with a scrungie or clean washcloth.  5.  Apply the CHG Soap to your body ONLY FROM THE NECK DOWN.   Do not use on face/ open                           Wound or open sores. Avoid contact with eyes, ears mouth and genitals (private parts).                       Wash face,  Genitals (private parts) with your normal soap.             6.  Wash thoroughly, paying special attention to the area where your surgery  will be performed.  7.  Thoroughly rinse your body with warm water from the neck down.  8.  DO NOT shower/wash with your normal soap after using and rinsing off  the CHG Soap.                9.  Pat yourself dry with a clean towel.            10.  Wear clean pajamas.            11.  Place clean sheets on your bed the night of your first shower and do not  sleep with pets. Day of Surgery : Do not apply any lotions/deodorants the morning of surgery.  Please wear clean clothes to the hospital/surgery center.  FAILURE TO FOLLOW THESE INSTRUCTIONS MAY RESULT IN THE CANCELLATION OF YOUR SURGERY PATIENT SIGNATURE_________________________________  NURSE SIGNATURE__________________________________  ________________________________________________________________________    CLEAR LIQUID DIET  THE DAY BEFORE SURGERY   Foods Allowed                                                                     Foods  Excluded  Coffee and tea, regular and decaf                             liquids that you cannot  Plain Jell-O in any flavor                                             see through such as: Fruit ices (not with fruit pulp)                                     milk, soups, orange juice  Iced Popsicles                                    All solid food Carbonated beverages, regular and diet  Cranberry, grape and apple juices Sports drinks like Gatorade Lightly seasoned clear broth or consume(fat free) Sugar, honey syrup  Sample Menu Breakfast                                Lunch                                     Supper Cranberry juice                    Beef broth                            Chicken broth Jell-O                                     Grape juice                           Apple juice Coffee or tea                        Jell-O                                      Popsicle                                                Coffee or tea                        Coffee or tea  _____________________________________________________________________   WHAT IS A BLOOD TRANSFUSION? Blood Transfusion Information  A transfusion is the replacement of blood or some of its parts. Blood is made up of multiple cells which provide different functions.  Red blood cells carry oxygen and are used for blood loss replacement.  White blood cells fight against infection.  Platelets control bleeding.  Plasma helps clot blood.  Other blood products are available for specialized needs, such as hemophilia or other clotting disorders. BEFORE THE TRANSFUSION  Who gives blood for transfusions?   Healthy volunteers who are fully evaluated to make sure their blood is safe. This is blood bank blood. Transfusion therapy is the safest it has ever been in the practice of medicine. Before blood is taken from a donor, a complete history is taken to make sure that person has no  history of diseases nor engages in risky social behavior (examples are intravenous drug use or sexual activity with multiple partners). The donor's travel history is screened to minimize risk of transmitting infections, such as malaria. The donated blood is tested for signs of infectious diseases, such as HIV and hepatitis. The blood is then tested to be sure it is compatible with you in order to minimize the chance of a transfusion reaction. If you or a relative donates blood, this is often done in anticipation of surgery and is not appropriate for emergency situations. It takes many days to process the donated blood.  RISKS AND COMPLICATIONS Although transfusion therapy is very safe and saves many lives, the main dangers of transfusion include:   Getting an infectious disease.  Developing a transfusion reaction. This is an allergic reaction to something in the blood you were given. Every precaution is taken to prevent this. The decision to have a blood transfusion has been considered carefully by your caregiver before blood is given. Blood is not given unless the benefits outweigh the risks. AFTER THE TRANSFUSION  Right after receiving a blood transfusion, you will usually feel much better and more energetic. This is especially true if your red blood cells have gotten low (anemic). The transfusion raises the level of the red blood cells which carry oxygen, and this usually causes an energy increase.  The nurse administering the transfusion will monitor you carefully for complications. HOME CARE INSTRUCTIONS  No special instructions are needed after a transfusion. You may find your energy is better. Speak with your caregiver about any limitations on activity for underlying diseases you may have. SEEK MEDICAL CARE IF:   Your condition is not improving after your transfusion.  You develop redness or irritation at the intravenous (IV) site. SEEK IMMEDIATE MEDICAL CARE IF:  Any of the following  symptoms occur over the next 12 hours:  Shaking chills.  You have a temperature by mouth above 102 F (38.9 C), not controlled by medicine.  Chest, back, or muscle pain.  People around you feel you are not acting correctly or are confused.  Shortness of breath or difficulty breathing.  Dizziness and fainting.  You get a rash or develop hives.  You have a decrease in urine output.  Your urine turns a dark color or changes to pink, red, or brown. Any of the following symptoms occur over the next 10 days:  You have a temperature by mouth above 102 F (38.9 C), not controlled by medicine.  Shortness of breath.  Weakness after normal activity.  The white part of the eye turns yellow (jaundice).  You have a decrease in the amount of urine or are urinating less often.  Your urine turns a dark color or changes to pink, red, or brown. Document Released: 11/05/2000 Document Revised: 01/31/2012 Document Reviewed: 06/24/2008 [facility] Patient Information 2014 [place], [place].  _______________________________________________________________________

## 2017-10-04 ENCOUNTER — Encounter (HOSPITAL_COMMUNITY)
Admission: RE | Admit: 2017-10-04 | Discharge: 2017-10-04 | Disposition: A | Discharge status: Home / Self Care | Payer: Medicare Other | Source: Ambulatory Visit | Attending: Urology | Admitting: Urology

## 2017-10-04 NOTE — Progress Notes (Addendum)
Spoke with Dr. Smith Robert (Anesthesia) regarding pt. History and upcoming surgery 10/06/17 . [place] cardiology on chart per Dr. Smith Robert request.Per Dr. Smith Robert at pt. appt  assessment of Symptoms of SOB, wheezing, any cardiac symptoms( palpitation, chest pain or arm discomfort etc.. bring to anesthesia may need clearance. As of now pt. Ok to proceed unless showing any  Symptoms.

## 2017-10-04 NOTE — Progress Notes (Signed)
EKG 08/11/17 epic lov note Dr. Jenkins [place] chart Echo 2014  Epic under media from [place] 1 view 07/10/17 epic

## 2017-10-05 ENCOUNTER — Other Ambulatory Visit: Payer: Self-pay

## 2017-10-05 ENCOUNTER — Encounter (HOSPITAL_COMMUNITY): Payer: Self-pay

## 2017-10-05 ENCOUNTER — Encounter (HOSPITAL_COMMUNITY)
Admission: RE | Admit: 2017-10-05 | Discharge: 2017-10-05 | Disposition: A | Discharge status: Home / Self Care | Payer: Medicare Other | Source: Ambulatory Visit | Attending: Urology | Admitting: Urology

## 2017-10-05 LAB — BASIC METABOLIC PANEL
Anion gap: 8 (ref 5–15)
BUN: 15 mg/dL (ref 6–20)
CO2: 30 mmol/L (ref 22–32)
Calcium: 9 mg/dL (ref 8.9–10.3)
Chloride: 101 mmol/L (ref 101–111)
Creatinine, Ser: 1.13 mg/dL (ref 0.61–1.24)
GFR calc Af Amer: >60 mL/min (ref >60)
Glucose, Bld: 90 mg/dL (ref 65–99)
POTASSIUM: 4.5 mmol/L (ref 3.5–5.1)
SODIUM: 139 mmol/L (ref 135–145)

## 2017-10-05 LAB — CBC
HEMATOCRIT: 38.4 % — AB (ref 39.0–52.0)
Hemoglobin: 12.4 g/dL — ABNORMAL LOW (ref 13.0–17.0)
MCH: 30.3 pg (ref 26.0–34.0)
MCHC: 32.3 g/dL (ref 30.0–36.0)
MCV: 93.9 fL (ref 78.0–100.0)
PLATELETS: 431 10*3/uL — AB (ref 150–400)
RBC: 4.09 MIL/uL — ABNORMAL LOW (ref 4.22–5.81)
RDW: 14.8 % (ref 11.5–15.5)
WBC: 12 10*3/uL — AB (ref 4.0–10.5)

## 2017-10-05 LAB — ABO/RH: ABO/RH(D): A POS

## 2017-10-05 NOTE — Patient Instructions (Addendum)
Brian Graham [facility]  10/05/2017   Your procedure is scheduled on: 10-07-17  Report to [facility] Main  Entrance Take [place]  elevators to 3rd floor to  [place] at [facility].   Call this number if you have problems the morning of surgery [phone]    Remember: ONLY 1 PERSON MAY GO WITH YOU TO SHORT STAY TO GET  READY MORNING OF YOUR SURGERY.    Do not eat food After Midnight on Wednesday 10-05-17. Drink plenty of clear liquids all day Thursday 10-06-17 and follow all bowel prep instructions provided by your surgeon. Nothing by mouth after midnight on Thursday!!     Take these medicines the morning of surgery with A SIP OF WATER: tylenol if needed, inhalers (may bring to hospital), nebulizers, amlodipine(norvasc), atenolol, synthroid, tamsulosin                                You may not have any metal on your body including hair pins and              piercings  Do not wear jewelry, make-up, lotions, powders or perfumes, deodorant                         Men may shave face and neck.   Do not bring valuables to the hospital. [place].  Contacts, dentures or bridgework may not be worn into surgery.  Leave suitcase in the car. It may be brought in after surgery.               Please read over the following fact sheets you were given: _____________________________________________________________________          CLEAR LIQUID DIET   Foods Allowed                                                                     Foods Excluded  Coffee and tea, regular and decaf                             liquids that you cannot  Plain Jell-O in any flavor                                             see through such as: Fruit ices (not with fruit pulp)                                     milk, soups, orange juice  Iced Popsicles                                    All solid food Carbonated beverages, regular  and diet                                     Cranberry, grape and apple juices Sports drinks like Gatorade Lightly seasoned clear broth or consume(fat free) Sugar, honey syrup  Sample Menu Breakfast                                Lunch                                     Supper Cranberry juice                    Beef broth                            Chicken broth Jell-O                                     Grape juice                           Apple juice Coffee or tea                        Jell-O                                      Popsicle                                                Coffee or tea                        Coffee or tea  _____________________________________________________________________  [facility] - Preparing for Surgery Before surgery, you can play an important role.  Because skin is not sterile, your skin needs to be as free of germs as possible.  You can reduce the number of germs on your skin by washing with CHG (chlorahexidine gluconate) soap before surgery.  CHG is an antiseptic cleaner which kills germs and bonds with the skin to continue killing germs even after washing. Please DO NOT use if you have an allergy to CHG or antibacterial soaps.  If your skin becomes reddened/irritated stop using the CHG and inform your nurse when you arrive at Short Stay. Do not shave (including legs and underarms) for at least 48 hours prior to the first CHG shower.  You may shave your face/neck. Please follow these instructions carefully:  1.  Shower with CHG Soap the night before surgery and the  morning of Surgery.  2.  If you choose to wash your hair, wash your hair first as usual with your  normal  shampoo.  3.  After you shampoo, rinse your hair and body thoroughly to remove the  shampoo.  4.  Use CHG as you would any other liquid soap.  You can apply chg directly  to the skin and wash                       Gently with a scrungie or clean washcloth.  5.  Apply  the CHG Soap to your body ONLY FROM THE NECK DOWN.   Do not use on face/ open                           Wound or open sores. Avoid contact with eyes, ears mouth and genitals (private parts).                       Wash face,  Genitals (private parts) with your normal soap.             6.  Wash thoroughly, paying special attention to the area where your surgery  will be performed.  7.  Thoroughly rinse your body with warm water from the neck down.  8.  DO NOT shower/wash with your normal soap after using and rinsing off  the CHG Soap.                9.  Pat yourself dry with a clean towel.            10.  Wear clean pajamas.            11.  Place clean sheets on your bed the night of your first shower and do not  sleep with pets. Day of Surgery : Do not apply any lotions/deodorants the morning of surgery.  Please wear clean clothes to the hospital/surgery center.  FAILURE TO FOLLOW THESE INSTRUCTIONS MAY RESULT IN THE CANCELLATION OF YOUR SURGERY PATIENT SIGNATURE_________________________________  NURSE SIGNATURE__________________________________  ________________________________________________________________________

## 2017-10-06 NOTE — Addendum Note (Signed)
Addended by: Lianne Cure A on: 10/06/2017 02:37 PM   Modules accepted: Orders

## 2017-10-07 ENCOUNTER — Inpatient Hospital Stay (HOSPITAL_COMMUNITY): Payer: Medicare Other | Admitting: Certified Registered Nurse Anesthetist

## 2017-10-07 ENCOUNTER — Other Ambulatory Visit: Payer: Self-pay

## 2017-10-07 ENCOUNTER — Encounter (HOSPITAL_COMMUNITY)
Admission: RE | Disposition: A | Discharge status: Home / Self Care | Payer: Self-pay | Source: Home / Self Care | Attending: Urology

## 2017-10-07 ENCOUNTER — Encounter (HOSPITAL_COMMUNITY): Payer: Self-pay | Admitting: *Deleted

## 2017-10-07 ENCOUNTER — Inpatient Hospital Stay (HOSPITAL_COMMUNITY)
Admission: RE | Admit: 2017-10-07 | Discharge: 2017-10-08 | DRG: 660 | Disposition: A | Discharge status: Home / Self Care | Payer: Medicare Other | Attending: Urology | Admitting: Urology

## 2017-10-07 DIAGNOSIS — I129 Hypertensive chronic kidney disease with stage 1 through stage 4 chronic kidney disease, or unspecified chronic kidney disease: Secondary | ICD-10-CM | POA: Diagnosis present

## 2017-10-07 DIAGNOSIS — G250 Essential tremor: Secondary | ICD-10-CM | POA: Diagnosis present

## 2017-10-07 DIAGNOSIS — F419 Anxiety disorder, unspecified: Secondary | ICD-10-CM | POA: Diagnosis present

## 2017-10-07 DIAGNOSIS — Z888 Allergy status to other drugs, medicaments and biological substances status: Secondary | ICD-10-CM | POA: Diagnosis not present

## 2017-10-07 DIAGNOSIS — I252 Old myocardial infarction: Secondary | ICD-10-CM | POA: Diagnosis not present

## 2017-10-07 DIAGNOSIS — E039 Hypothyroidism, unspecified: Secondary | ICD-10-CM | POA: Diagnosis present

## 2017-10-07 DIAGNOSIS — Q632 Ectopic kidney: Secondary | ICD-10-CM

## 2017-10-07 DIAGNOSIS — Z87440 Personal history of urinary (tract) infections: Secondary | ICD-10-CM | POA: Diagnosis not present

## 2017-10-07 DIAGNOSIS — N183 Chronic kidney disease, stage 3 (moderate): Secondary | ICD-10-CM | POA: Diagnosis present

## 2017-10-07 DIAGNOSIS — K219 Gastro-esophageal reflux disease without esophagitis: Secondary | ICD-10-CM | POA: Diagnosis present

## 2017-10-07 DIAGNOSIS — E785 Hyperlipidemia, unspecified: Secondary | ICD-10-CM | POA: Diagnosis present

## 2017-10-07 DIAGNOSIS — Z87891 Personal history of nicotine dependence: Secondary | ICD-10-CM | POA: Diagnosis not present

## 2017-10-07 DIAGNOSIS — Z8619 Personal history of other infectious and parasitic diseases: Secondary | ICD-10-CM | POA: Diagnosis not present

## 2017-10-07 DIAGNOSIS — N136 Pyonephrosis: Principal | ICD-10-CM | POA: Diagnosis present

## 2017-10-07 DIAGNOSIS — I739 Peripheral vascular disease, unspecified: Secondary | ICD-10-CM | POA: Diagnosis present

## 2017-10-07 DIAGNOSIS — Z882 Allergy status to sulfonamides status: Secondary | ICD-10-CM

## 2017-10-07 DIAGNOSIS — J449 Chronic obstructive pulmonary disease, unspecified: Secondary | ICD-10-CM | POA: Diagnosis present

## 2017-10-07 DIAGNOSIS — Z881 Allergy status to other antibiotic agents status: Secondary | ICD-10-CM

## 2017-10-07 DIAGNOSIS — I25810 Atherosclerosis of coronary artery bypass graft(s) without angina pectoris: Secondary | ICD-10-CM | POA: Diagnosis present

## 2017-10-07 DIAGNOSIS — N261 Atrophy of kidney (terminal): Secondary | ICD-10-CM | POA: Diagnosis present

## 2017-10-07 HISTORY — PX: ROBOT ASSISTED LAPAROSCOPIC NEPHRECTOMY: SHX5140

## 2017-10-07 LAB — TYPE AND SCREEN
ABO/RH(D): A POS
ANTIBODY SCREEN: NEGATIVE

## 2017-10-07 SURGERY — NEPHRECTOMY, RADICAL, ROBOT-ASSISTED, LAPAROSCOPIC, ADULT
Anesthesia: General | Laterality: Left

## 2017-10-07 MED ORDER — ROCURONIUM BROMIDE 10 MG/ML (PF) SYRINGE
PREFILLED_SYRINGE | INTRAVENOUS | Status: DC | PRN
Start: 2017-10-07 — End: 2017-10-07
  Administered 2017-10-07: 20 mg via INTRAVENOUS
  Administered 2017-10-07: 50 mg via INTRAVENOUS

## 2017-10-07 MED ORDER — MEPERIDINE HCL 50 MG/ML IJ SOLN: 6.2500 mg | INTRAMUSCULAR | Status: DC | PRN

## 2017-10-07 MED ORDER — NICOTINE 14 MG/24HR TD PT24
14.0000 mg | MEDICATED_PATCH | Freq: Every day | TRANSDERMAL | Status: DC
  Administered 2017-10-07 – 2017-10-08 (×2): 14 mg via TRANSDERMAL
  Filled 2017-10-07 (×2): qty 1

## 2017-10-07 MED ORDER — TIOTROPIUM BROMIDE MONOHYDRATE 2.5 MCG/ACT IN AERS
2.0000 | INHALATION_SPRAY | Freq: Every day | RESPIRATORY_TRACT | Status: DC
  Filled 2017-10-07: qty 2

## 2017-10-07 MED ORDER — FENTANYL CITRATE (PF) 250 MCG/5ML IJ SOLN
INTRAMUSCULAR | Status: AC
  Filled 2017-10-07: qty 5

## 2017-10-07 MED ORDER — DIPHENHYDRAMINE HCL 12.5 MG/5ML PO ELIX
12.5000 mg | ORAL_SOLUTION | Freq: Four times a day (QID) | ORAL | Status: DC | PRN
  Administered 2017-10-08: 12.5 mg via ORAL
  Filled 2017-10-07: qty 5

## 2017-10-07 MED ORDER — DEXTROSE-NACL 5-0.45 % IV SOLN
INTRAVENOUS | Status: DC
  Administered 2017-10-07 – 2017-10-08 (×2): via INTRAVENOUS

## 2017-10-07 MED ORDER — SODIUM CHLORIDE 0.9 % IJ SOLN
INTRAMUSCULAR | Status: DC | PRN
  Administered 2017-10-07: 20 mL

## 2017-10-07 MED ORDER — ONDANSETRON HCL 4 MG/2ML IJ SOLN
INTRAMUSCULAR | Status: AC
  Filled 2017-10-07: qty 2

## 2017-10-07 MED ORDER — PROPOFOL 10 MG/ML IV BOLUS
INTRAVENOUS | Status: DC | PRN
  Administered 2017-10-07: 140 mg via INTRAVENOUS
  Administered 2017-10-07: 40 mg via INTRAVENOUS

## 2017-10-07 MED ORDER — GENTAMICIN SULFATE 40 MG/ML IJ SOLN
5.0000 mg/kg | INTRAVENOUS | Status: AC
  Administered 2017-10-07: 320 mg via INTRAVENOUS
  Filled 2017-10-07: qty 8

## 2017-10-07 MED ORDER — SODIUM CHLORIDE 0.9 % IJ SOLN
INTRAMUSCULAR | Status: AC
  Filled 2017-10-07: qty 20

## 2017-10-07 MED ORDER — BENAZEPRIL HCL 20 MG PO TABS
20.0000 mg | ORAL_TABLET | Freq: Every day | ORAL | Status: DC
Start: 2017-10-08 — End: 2017-10-08
  Administered 2017-10-08: 20 mg via ORAL
  Filled 2017-10-07: qty 2
  Filled 2017-10-07: qty 1

## 2017-10-07 MED ORDER — ATENOLOL 50 MG PO TABS
50.0000 mg | ORAL_TABLET | Freq: Every day | ORAL | Status: DC
  Administered 2017-10-08: 50 mg via ORAL
  Filled 2017-10-07: qty 1

## 2017-10-07 MED ORDER — EPHEDRINE SULFATE-NACL 50-0.9 MG/10ML-% IV SOSY
PREFILLED_SYRINGE | INTRAVENOUS | Status: DC | PRN
  Administered 2017-10-07: 5 mg via INTRAVENOUS

## 2017-10-07 MED ORDER — DEXAMETHASONE SODIUM PHOSPHATE 10 MG/ML IJ SOLN
INTRAMUSCULAR | Status: DC | PRN
  Administered 2017-10-07: 10 mg via INTRAVENOUS

## 2017-10-07 MED ORDER — HYDROMORPHONE HCL 1 MG/ML IJ SOLN
0.5000 mg | INTRAMUSCULAR | Status: DC | PRN
  Administered 2017-10-07 – 2017-10-08 (×4): 1 mg via INTRAVENOUS
  Filled 2017-10-07 (×4): qty 1

## 2017-10-07 MED ORDER — TAMSULOSIN HCL 0.4 MG PO CAPS: 0.4000 mg | ORAL_CAPSULE | Freq: Every day | ORAL | Status: DC | PRN

## 2017-10-07 MED ORDER — PHENYLEPHRINE 40 MCG/ML (10ML) SYRINGE FOR IV PUSH (FOR BLOOD PRESSURE SUPPORT)
PREFILLED_SYRINGE | INTRAVENOUS | Status: DC | PRN
  Administered 2017-10-07: 100 ug via INTRAVENOUS
  Administered 2017-10-07: 80 ug via INTRAVENOUS

## 2017-10-07 MED ORDER — ONDANSETRON HCL 4 MG/2ML IJ SOLN
4.0000 mg | Freq: Once | INTRAMUSCULAR | Status: AC | PRN
  Administered 2017-10-07: 4 mg via INTRAVENOUS

## 2017-10-07 MED ORDER — PROPOFOL 10 MG/ML IV BOLUS
INTRAVENOUS | Status: AC
  Filled 2017-10-07: qty 20

## 2017-10-07 MED ORDER — ATORVASTATIN CALCIUM 80 MG PO TABS
80.0000 mg | ORAL_TABLET | Freq: Every evening | ORAL | Status: DC
  Administered 2017-10-07: 80 mg via ORAL
  Filled 2017-10-07: qty 1
  Filled 2017-10-07: qty 2

## 2017-10-07 MED ORDER — LACTATED RINGERS IV SOLN
INTRAVENOUS | Status: DC
  Administered 2017-10-07 (×2): via INTRAVENOUS

## 2017-10-07 MED ORDER — LEVOTHYROXINE SODIUM 125 MCG PO TABS
125.0000 ug | ORAL_TABLET | Freq: Every day | ORAL | Status: DC
  Administered 2017-10-08: 125 ug via ORAL
  Filled 2017-10-07: qty 1

## 2017-10-07 MED ORDER — HYDROMORPHONE HCL 1 MG/ML IJ SOLN
0.2500 mg | INTRAMUSCULAR | Status: DC | PRN
  Administered 2017-10-07 (×4): 0.5 mg via INTRAVENOUS

## 2017-10-07 MED ORDER — MAGNESIUM CITRATE PO SOLN
1.0000 | Freq: Once | ORAL | Status: DC
  Filled 2017-10-07: qty 296

## 2017-10-07 MED ORDER — DIPHENHYDRAMINE HCL 50 MG/ML IJ SOLN: 12.5000 mg | Freq: Four times a day (QID) | INTRAMUSCULAR | Status: DC | PRN

## 2017-10-07 MED ORDER — ONDANSETRON HCL 4 MG/2ML IJ SOLN: 4.0000 mg | INTRAMUSCULAR | Status: DC | PRN

## 2017-10-07 MED ORDER — SUGAMMADEX SODIUM 200 MG/2ML IV SOLN
INTRAVENOUS | Status: DC | PRN
  Administered 2017-10-07: 150 mg via INTRAVENOUS

## 2017-10-07 MED ORDER — MIDAZOLAM HCL 2 MG/2ML IJ SOLN
INTRAMUSCULAR | Status: AC
  Filled 2017-10-07: qty 2

## 2017-10-07 MED ORDER — ONDANSETRON HCL 4 MG/2ML IJ SOLN
INTRAMUSCULAR | Status: DC | PRN
  Administered 2017-10-07: 4 mg via INTRAVENOUS

## 2017-10-07 MED ORDER — STERILE WATER FOR IRRIGATION IR SOLN
Status: DC | PRN
  Administered 2017-10-07: 1000 mL

## 2017-10-07 MED ORDER — FENTANYL CITRATE (PF) 100 MCG/2ML IJ SOLN
INTRAMUSCULAR | Status: AC
  Filled 2017-10-07: qty 2

## 2017-10-07 MED ORDER — ACETAMINOPHEN 500 MG PO TABS
1000.0000 mg | ORAL_TABLET | Freq: Four times a day (QID) | ORAL | Status: DC
  Administered 2017-10-07 – 2017-10-08 (×3): 1000 mg via ORAL
  Filled 2017-10-07 (×3): qty 2

## 2017-10-07 MED ORDER — MOMETASONE FURO-FORMOTEROL FUM 200-5 MCG/ACT IN AERO
2.0000 | INHALATION_SPRAY | Freq: Two times a day (BID) | RESPIRATORY_TRACT | Status: DC
  Filled 2017-10-07: qty 8.8

## 2017-10-07 MED ORDER — ALBUTEROL SULFATE (2.5 MG/3ML) 0.083% IN NEBU: 3.0000 mL | INHALATION_SOLUTION | Freq: Four times a day (QID) | RESPIRATORY_TRACT | Status: DC | PRN

## 2017-10-07 MED ORDER — HYDROMORPHONE HCL 1 MG/ML IJ SOLN
INTRAMUSCULAR | Status: AC
  Filled 2017-10-07: qty 2

## 2017-10-07 MED ORDER — HYDROCODONE-ACETAMINOPHEN 5-325 MG PO TABS: 1.0000 | ORAL_TABLET | Freq: Four times a day (QID) | ORAL | 0 refills | Status: DC | PRN

## 2017-10-07 MED ORDER — FENTANYL CITRATE (PF) 100 MCG/2ML IJ SOLN
INTRAMUSCULAR | Status: DC | PRN
  Administered 2017-10-07: 50 ug via INTRAVENOUS
  Administered 2017-10-07: 100 ug via INTRAVENOUS
  Administered 2017-10-07 (×3): 50 ug via INTRAVENOUS

## 2017-10-07 MED ORDER — OXYCODONE HCL 5 MG PO TABS
5.0000 mg | ORAL_TABLET | ORAL | Status: DC | PRN
  Administered 2017-10-08: 5 mg via ORAL
  Filled 2017-10-07: qty 1

## 2017-10-07 MED ORDER — ALBUTEROL SULFATE (2.5 MG/3ML) 0.083% IN NEBU: 2.5000 mg | INHALATION_SOLUTION | Freq: Four times a day (QID) | RESPIRATORY_TRACT | Status: DC | PRN

## 2017-10-07 MED ORDER — MIDAZOLAM HCL 5 MG/5ML IJ SOLN
INTRAMUSCULAR | Status: DC | PRN
  Administered 2017-10-07: 2 mg via INTRAVENOUS

## 2017-10-07 MED ORDER — BUPIVACAINE LIPOSOME 1.3 % IJ SUSP
20.0000 mL | Freq: Once | INTRAMUSCULAR | Status: AC
  Administered 2017-10-07: 20 mL
  Filled 2017-10-07: qty 20

## 2017-10-07 MED ORDER — LACTATED RINGERS IR SOLN
Status: DC | PRN
  Administered 2017-10-07: 1000 mL

## 2017-10-07 MED ORDER — AMLODIPINE BESYLATE 5 MG PO TABS
5.0000 mg | ORAL_TABLET | Freq: Every day | ORAL | Status: DC
  Administered 2017-10-08: 5 mg via ORAL
  Filled 2017-10-07: qty 1

## 2017-10-07 MED ORDER — CLINDAMYCIN PHOSPHATE 900 MG/50ML IV SOLN
900.0000 mg | INTRAVENOUS | Status: AC
  Administered 2017-10-07: 900 mg via INTRAVENOUS
  Filled 2017-10-07: qty 50

## 2017-10-07 MED ORDER — LIDOCAINE 2% (20 MG/ML) 5 ML SYRINGE
INTRAMUSCULAR | Status: DC | PRN
  Administered 2017-10-07: 60 mg via INTRAVENOUS

## 2017-10-07 SURGICAL SUPPLY — 62 items
APPLICATOR COTTON TIP 6IN STRL (MISCELLANEOUS) ×2 IMPLANT
BAG LAPAROSCOPIC 12 15 PORT 16 (BASKET) ×1 IMPLANT
BAG RETRIEVAL 12/15 (BASKET) ×2
CHLORAPREP W/TINT 26ML (MISCELLANEOUS) ×2 IMPLANT
CLIP VESOLOCK LG 6/CT PURPLE (CLIP) ×2 IMPLANT
CLIP VESOLOCK MED LG 6/CT (CLIP) ×2 IMPLANT
CLIP VESOLOCK XL 6/CT (CLIP) ×2 IMPLANT
COVER SURGICAL LIGHT HANDLE (MISCELLANEOUS) ×2 IMPLANT
COVER TIP SHEARS 8 DVNC (MISCELLANEOUS) ×1 IMPLANT
COVER TIP SHEARS 8MM DA VINCI (MISCELLANEOUS) ×1
CUTTER ECHEON FLEX ENDO 45 340 (ENDOMECHANICALS) ×2 IMPLANT
DECANTER SPIKE VIAL GLASS SM (MISCELLANEOUS) ×2 IMPLANT
DERMABOND ADVANCED (GAUZE/BANDAGES/DRESSINGS) ×1
DERMABOND ADVANCED .7 DNX12 (GAUZE/BANDAGES/DRESSINGS) ×1 IMPLANT
DRAIN CHANNEL 15F RND FF 3/16 (WOUND CARE) ×2 IMPLANT
DRAPE ARM DVNC X/XI (DISPOSABLE) ×4 IMPLANT
DRAPE COLUMN DVNC XI (DISPOSABLE) ×1 IMPLANT
DRAPE DA VINCI XI ARM (DISPOSABLE) ×4
DRAPE DA VINCI XI COLUMN (DISPOSABLE) ×1
DRAPE INCISE IOBAN 66X45 STRL (DRAPES) ×2 IMPLANT
DRAPE LAPAROSCOPIC ABDOMINAL (DRAPES) IMPLANT
DRAPE SHEET LG 3/4 BI-LAMINATE (DRAPES) ×4 IMPLANT
DRAPE SURG IRRIG POUCH 19X23 (DRAPES) ×2 IMPLANT
ELECT PENCIL ROCKER SW 15FT (MISCELLANEOUS) ×2 IMPLANT
ELECT REM PT RETURN 15FT ADLT (MISCELLANEOUS) ×4 IMPLANT
EVACUATOR SILICONE 100CC (DRAIN) IMPLANT
GLOVE BIO SURGEON STRL SZ 6.5 (GLOVE) ×2 IMPLANT
GLOVE BIOGEL M STRL SZ7.5 (GLOVE) ×6 IMPLANT
GOWN STRL REUS W/TWL LRG LVL3 (GOWN DISPOSABLE) ×8 IMPLANT
HOLDER FOLEY CATH W/STRAP (MISCELLANEOUS) ×2 IMPLANT
IRRIG SUCT STRYKERFLOW 2 WTIP (MISCELLANEOUS) ×2
IRRIGATION SUCT STRKRFLW 2 WTP (MISCELLANEOUS) ×1 IMPLANT
KIT BASIN OR (CUSTOM PROCEDURE TRAY) ×2 IMPLANT
LOOP VESSEL MAXI BLUE (MISCELLANEOUS) ×2 IMPLANT
NEEDLE INSUFFLATION 14GA 120MM (NEEDLE) ×2 IMPLANT
PAD POSITIONING PINK XL (MISCELLANEOUS) ×2 IMPLANT
PORT ACCESS TROCAR AIRSEAL 12 (TROCAR) ×1 IMPLANT
PORT ACCESS TROCAR AIRSEAL 5M (TROCAR) ×1
POSITIONER SURGICAL ARM (MISCELLANEOUS) ×2 IMPLANT
RELOAD STAPLER WHITE 60MM (STAPLE) IMPLANT
RELOAD WH ECHELON 45 (STAPLE) ×14 IMPLANT
SEAL CANN UNIV 5-8 DVNC XI (MISCELLANEOUS) ×4 IMPLANT
SEAL XI 5MM-8MM UNIVERSAL (MISCELLANEOUS) ×4
SET TRI-LUMEN FLTR TB AIRSEAL (TUBING) ×2 IMPLANT
SHEET LAVH (DRAPES) ×2 IMPLANT
SOLUTION ELECTROLUBE (MISCELLANEOUS) ×2 IMPLANT
SPONGE LAP 4X18 X RAY DECT (DISPOSABLE) IMPLANT
STAPLE ECHEON FLEX 60 POW ENDO (STAPLE) IMPLANT
STAPLER RELOAD WHITE 60MM (STAPLE)
SUT ETHILON 3 0 PS 1 (SUTURE) IMPLANT
SUT MNCRL AB 4-0 PS2 18 (SUTURE) ×4 IMPLANT
SUT PDS AB 1 CT1 27 (SUTURE) ×6 IMPLANT
SUT VICRYL 0 UR6 27IN ABS (SUTURE) IMPLANT
SYR BULB IRRIGATION 50ML (SYRINGE) ×2 IMPLANT
TOWEL OR 17X26 10 PK STRL BLUE (TOWEL DISPOSABLE) ×2 IMPLANT
TOWEL OR NON WOVEN STRL DISP B (DISPOSABLE) ×2 IMPLANT
TRAY FOLEY W/METER SILVER 16FR (SET/KITS/TRAYS/PACK) ×2 IMPLANT
TRAY LAPAROSCOPIC (CUSTOM PROCEDURE TRAY) ×2 IMPLANT
TROCAR BLADELESS OPT 12M 100M (ENDOMECHANICALS) IMPLANT
TROCAR BLADELESS OPT 5 100 (ENDOMECHANICALS) ×2 IMPLANT
TROCAR XCEL 12X100 BLDLESS (ENDOMECHANICALS) ×2 IMPLANT
WATER STERILE IRR 1000ML POUR (IV SOLUTION) ×2 IMPLANT

## 2017-10-07 NOTE — Anesthesia Preprocedure Evaluation (Signed)
Anesthesia Evaluation  Patient identified by MRN, date of birth, ID band Patient awake    Reviewed: Allergy & Precautions, NPO status , Patient's Chart, lab work & pertinent test results  History of Anesthesia Complications (+) PONV  Airway Mallampati: I  TM Distance: >3 FB Neck ROM: Full    Dental   Pulmonary COPD, former smoker,    Pulmonary exam normal        Cardiovascular hypertension, Pt. on medications + CAD, + Past MI and + CABG  Normal cardiovascular exam     Neuro/Psych Anxiety    GI/Hepatic GERD  Medicated and Controlled,  Endo/Other    Renal/GU      Musculoskeletal   Abdominal   Peds  Hematology   Anesthesia Other Findings   Reproductive/Obstetrics                             Anesthesia Physical Anesthesia Plan  ASA: III  Anesthesia Plan: General   Post-op Pain Management:    Induction: Intravenous  PONV Risk Score and Plan: 3 and Midazolam, Dexamethasone and Ondansetron  Airway Management Planned: Oral ETT  Additional Equipment:   Intra-op Plan:   Post-operative Plan: Extubation in OR  Informed Consent: I have reviewed the patients History and Physical, chart, labs and discussed the procedure including the risks, benefits and alternatives for the proposed anesthesia with the patient or authorized representative who has indicated his/her understanding and acceptance.     Plan Discussed with: CRNA and Surgeon  Anesthesia Plan Comments:         Anesthesia Quick Evaluation

## 2017-10-07 NOTE — Discharge Instructions (Signed)
1- Drain Sites - You may have some mild persistent drainage from old drain site for several days, this is normal. This can be covered with cotton gauze for convenience.  2 - Stiches - Your stitches are all dissolvable. You may notice a "loose thread" at your incisions, these are normal and require no intervention. You may cut them flush to the skin with fingernail clippers if needed for comfort.  3 - Diet - No restrictions  4 - Activity - No heavy lifting / straining (any activities that require valsalva or "bearing down") x 4 weeks. Otherwise, no restrictions.  5 - Bathing - You may shower immediately. Do not take a bath or get into swimming pool where incision sites are submersed in water x 4 weeks.   6 - When to Call the Doctor - Call MD for any fever >102, any acute wound problems, or any severe nausea / vomiting. You can call the [facility] [phone]) 24 hours a day 365 days a year. It will roll-over to the answering service and on-call physician after hours.  You may resume Plavix, aspirin, advil, aleve, vitamins, and supplements 7 days after surgery.

## 2017-10-07 NOTE — Anesthesia Postprocedure Evaluation (Signed)
Anesthesia Post Note  Patient: Brian Graham  Procedure(s) Performed: XI ROBOTIC ASSISTED LAPAROSCOPIC NEPHRECTOMY OF PELVIC KIDNEY (Left )     Patient location during evaluation: PACU Anesthesia Type: General Level of consciousness: awake and alert Pain management: pain level controlled Vital Signs Assessment: post-procedure vital signs reviewed and stable Respiratory status: spontaneous breathing, nonlabored ventilation, respiratory function stable and patient connected to nasal cannula oxygen Cardiovascular status: blood pressure returned to baseline and stable Postop Assessment: no apparent nausea or vomiting Anesthetic complications: no    Last Vitals:  Vitals:   10/07/17 1445 10/07/17 1500  BP: 122/70 118/67  Pulse: (!) 58 62  Resp: 11 13  Temp:  36.4 C  SpO2: 98% 96%    Last Pain:  Vitals:   10/07/17 1500  TempSrc:   PainSc: Alma DAVID

## 2017-10-07 NOTE — Transfer of Care (Signed)
Immediate Anesthesia Transfer of Care Note  Patient: Brian Graham  Procedure(s) Performed: XI ROBOTIC ASSISTED LAPAROSCOPIC NEPHRECTOMY OF PELVIC KIDNEY (Left )  Patient Location: PACU  Anesthesia Type:General  Level of Consciousness: sedated, patient cooperative and responds to stimulation  Airway & Oxygen Therapy: Patient Spontanous Breathing and Patient connected to face mask oxygen  Post-op Assessment: Report given to RN and Post -op Vital signs reviewed and stable  Post vital signs: Reviewed and stable  Last Vitals:  Vitals:   10/07/17 0911  BP: (!) 145/88  Pulse: 64  Resp: 18  Temp: 36.4 C  SpO2: 97%    Last Pain:  Vitals:   10/07/17 0911  TempSrc: Oral      Patients Stated Pain Goal: 5 (56/81/27 5170)  Complications: No apparent anesthesia complications

## 2017-10-07 NOTE — Brief Op Note (Signed)
10/07/2017  1:49 PM  PATIENT:  Brian Graham  66 y.o. male  PRE-OPERATIVE DIAGNOSIS:  ATROPHIC LEFT PELVIC KIDNEY  POST-OPERATIVE DIAGNOSIS:  ATROPHIC LEFT PELVIC KIDNEY  PROCEDURE:  Procedure(s) with comments: XI ROBOTIC ASSISTED LAPAROSCOPIC NEPHRECTOMY OF PELVIC KIDNEY (Left) - Place robot patient tower at foot of bed like prostate per Dr. Tresa Moore. Pull extra staple loads.  SURGEON:  Surgeon(s) and Role:    * Alexis Frock, MD - Primary  PHYSICIAN ASSISTANT:   ASSISTANTS: Debbrah Alar PA    ANESTHESIA:   local and general  EBL:  50 mL   BLOOD ADMINISTERED:none  DRAINS: Foley to gravity   LOCAL MEDICATIONS USED:  MARCAINE     SPECIMEN:  Source of Specimen:  atrophic left pelvic kidney  DISPOSITION OF SPECIMEN:  PATHOLOGY  COUNTS:  YES  TOURNIQUET:  * No tourniquets in log *  DICTATION: .Other Dictation: Dictation Number [id number]  PLAN OF CARE: Admit to inpatient   PATIENT DISPOSITION:  PACU - hemodynamically stable.   Delay start of Pharmacological VTE agent (>24hrs) due to surgical blood loss or risk of bleeding: yes

## 2017-10-07 NOTE — H&P (Signed)
Brian Graham is an 66 y.o. male.    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 PRE-op renogram with only 17% left function, but pyeloplasty performed anyway. F/u Renogram 2015 "stable" per report (images not avail for review).   Recent Surveillacne:  01/2017 - Cr 0.8, CT - Left pelvic kidney with hydro, preserved parenchyam, very complex renovascular anatomy (2-3 arteries off aorta, 1 artery of left common iliac, 2 arteries off right common iliac). Renogram with 81%Rt / 19% Lt (likely underestimate as pelvic bone shielding) with Lt T1/2 36mn with lasix (no foley in).   2 - Recurrent Enterococcus Cystitis - treated 03/2017 for enterococcus cystitis through ER. Imaging at that time with approx stable left pelvic kidney hydro. PVR 05/2017 "30" mL (normal). he states he had two other episodes treated via urgent care. Noted again 05/2017 and sens amp, nictro, vanc, RES levoquin.   PMH sig for CAD/ CABG (not limiting at present), Left common iliac stent 2018, COPD / Former Smoker (not limiting at present). His PCP is DSimonne Graham.   Today " BRush Landmark" is seen to proceed with nephrectomy of atrophic hydronephrotic pelvic kidney that is likely nidus for recurrent infections.    Past Medical History:  Diagnosis Date  . Anxiety   . Arthritis    "left knee" (08/05/2016)  . Bruises easily   . CAD (coronary artery disease) of artery bypass graft 08/05/2016   Around age 66 CABG - 3 vessel.   . Chronic kidney disease   . Colon polyp   . COPD, severe ([facility] 08/23/2016   Alpha 1 studies normal per prior pulmonary group notes Smoked 40 years, quit in 2012 June 2016 PFT from prior pulmonary group: "significant obstruction" FEV1 1.58L (47% pred), Residual volume 171% pred, DLCO 59% pred Simple  Spirometry>> 09/15/2016 ratio 42% FEV1 1.02 L / 31%   . Essential tremor 06/11/2016   Plans to see Dr. [name] . Former smoker 09/01/2016   Quit 2012. 40 pack years at least.   . GERD (gastroesophageal reflux disease)   . Headache   . History of blood transfusion 08/1997   "when he had his heart surgery"  . History of shingles 1970-2013 X 3  . HTN (hypertension) 08/05/2016   Amlodipine '5mg'$ , atenolol '50mg'$  BID, benazepril '20mg'$   . Hyperlipemia   . Hypothyroidism   . Lumbar disc disease   . Myocardial infarction (Brian Graham 08/1997  . Peripheral vascular disease ([facility]iner   . PONV (postoperative nausea and vomiting)   . Urinary tract infection 09/03/2017  . Wears glasses     Past Surgical History:  Procedure Laterality Date  . AORTOGRAM Right 08/31/2017   Procedure: AORTOGRAM BILATERAL PELVIC ANGIOGRAM WITH LEFT ILIAC ARTERY STENT;  Surgeon: Brian Graham [place] MD;  [facility]  Service: Vascular;  Laterality: Right;  . BACK SURGERY    . CARDIAC CATHETERIZATION  08/1997  . CORONARY ARTERY BYPASS GRAFT  09/12/1997   "triple"  . INGUINAL HERNIA REPAIR Right 1961  . KNEE RECONSTRUCTION Left 1960s - 1974 X 4  . LAPAROSCOPIC ABLATION RENAL MASS  ~ 2014   "large abscess/pus ball"  . [place]  "they were wedged in the bowel"  . PATELLA FRACTURE SURGERY Left 1963  . PATELLA FRACTURE SURGERY Left ~  1965   "removed knee cap"  . TONSILLECTOMY      Family History  Problem Relation Age of Onset  . Alcohol abuse Mother   . Alcohol abuse Father        not involved in life  . Alcoholism Sister   . Healthy Sister   . Healthy Sister    Social History:  reports that he quit smoking about 2 months ago. His smoking use included cigarettes. He has a 45.00 pack-year smoking history. he has never used smokeless tobacco. He reports that he drinks about 8.4 oz of alcohol per week. He reports that he does not use drugs.  Allergies:  Allergies  Allergen Reactions  . Keflex [Cephalexin]  Diarrhea, Nausea Only and Other (See Comments)    Insomnia  . Sulfa Antibiotics Other (See Comments)    Insomnia  . Ciprofloxacin Diarrhea  . Levaquin [Levofloxacin] Other (See Comments)    insomnia  . Omeprazole Diarrhea    No medications prior to admission.    Results for orders placed or performed during the hospital encounter of 10/05/17 (from the past 48 hour(s))  Type and screen All Cardiac and thoracic surgeries, spinal fusions, myomectomies, craniotomies, colon & liver resections, total joint revisions, same day c-section with placenta previa or accreta.     Status: None   Collection Time: 10/05/17  9:29 AM  Result Value Ref Range   ABO/RH(Brian) A POS    Antibody Screen NEG    Sample Expiration 10/19/2017    Extend sample reason NO TRANSFUSIONS OR PREGNANCY IN THE PAST 3 MONTHS   ABO/Rh     Status: None   Collection Time: 10/05/17  9:29 AM  Result Value Ref Range   ABO/RH(Brian) A POS   Basic metabolic panel     Status: None   Collection Time: 10/05/17  9:30 AM  Result Value Ref Range   Sodium 139 135 - 145 mmol/L   Potassium 4.5 3.5 - 5.1 mmol/L   Chloride 101 101 - 111 mmol/L   CO2 30 22 - 32 mmol/L   Glucose, Bld 90 65 - 99 mg/dL   BUN 15 6 - 20 mg/dL   Creatinine, Ser 1.13 0.61 - 1.24 mg/dL   Calcium 9.0 8.9 - 10.3 mg/dL   GFR calc non Af Amer >60 >60 mL/min   GFR calc Af Amer >60 >60 mL/min    Comment: (NOTE) The eGFR has been calculated using the CKD EPI equation. This calculation has not been validated in all clinical situations. eGFR's persistently <60 mL/min signify possible Chronic Kidney Disease.    Anion gap 8 5 - 15  CBC     Status: Abnormal   Collection Time: 10/05/17  9:30 AM  Result Value Ref Range   WBC 12.0 (Brian) 4.0 - 10.5 K/uL   RBC 4.09 (L) 4.22 - 5.81 MIL/uL   Hemoglobin 12.4 (L) 13.0 - 17.0 g/dL   HCT 38.4 (L) 39.0 - 52.0 %   MCV 93.9 78.0 - 100.0 fL   MCH 30.3 26.0 - 34.0 pg   MCHC 32.3 30.0 - 36.0 g/dL   RDW 14.8 11.5 - 15.5 %    Platelets 431 (Brian) 150 - 400 K/uL   No results found.  Review of Systems  Constitutional: Negative.  Negative for chills and fever.  HENT: Negative.   Eyes: Negative.   Respiratory: Negative.   Cardiovascular: Negative.   Gastrointestinal: Negative.   Genitourinary: Negative.   Musculoskeletal: Negative.   Skin: Negative.  Neurological: Negative.   Endo/Heme/Allergies: Negative.   Psychiatric/Behavioral: Negative.     There were no vitals taken for this visit. Physical Exam  Constitutional: He appears well-developed.  HENT:  Head: Normocephalic.  Eyes: Pupils are equal, round, and reactive to light.  Neck: Normal range of motion.  Cardiovascular: Normal rate.  Respiratory: Effort normal.  GI:  Prior scars w/o hernias.   Genitourinary:  Genitourinary Comments: No CVAT  Musculoskeletal: Normal range of motion.  Neurological: He is alert.  Skin: Skin is warm.  Psychiatric: He has a normal mood and affect.     Assessment/Plan  Pt with recurrent GU infections, some severe, likely due to poorly draining atrophic pelvic left kidney. Definitive management would be nephrectomy. I feel any sort of re-attempt reconstruction would be futile as differential function <20%.   He wants to proceed with this now. Risks, benefits, alternatives (including observation), and expected peri-op course discussed previously and reiterated today. Marland Kitchen     Alexis Frock, MD 10/07/2017, 6:06 AM

## 2017-10-07 NOTE — Anesthesia Procedure Notes (Signed)
Procedure Name: Intubation Performed by: Gean Maidens, CRNA Pre-anesthesia Checklist: Patient identified, Emergency Drugs available, Suction available, Patient being monitored and Timeout performed Patient Re-evaluated:Patient Re-evaluated prior to induction Oxygen Delivery Method: Circle system utilized Preoxygenation: Pre-oxygenation with 100% oxygen Induction Type: IV induction Ventilation: Mask ventilation without difficulty Laryngoscope Size: Mac and 3 Grade View: Grade I Tube type: Oral Tube size: 7.5 mm Number of attempts: 1 Airway Equipment and Method: Stylet Placement Confirmation: ETT inserted through vocal cords under direct vision,  positive ETCO2,  CO2 detector and breath sounds checked- equal and bilateral Secured at: 22 cm Tube secured with: Tape Dental Injury: Teeth and Oropharynx as per pre-operative assessment

## 2017-10-08 ENCOUNTER — Encounter (HOSPITAL_COMMUNITY): Payer: Self-pay | Admitting: Urology

## 2017-10-08 LAB — BASIC METABOLIC PANEL
Anion gap: 8 (ref 5–15)
BUN: 17 mg/dL (ref 6–20)
CHLORIDE: 98 mmol/L — AB (ref 101–111)
CO2: 29 mmol/L (ref 22–32)
Calcium: 8.4 mg/dL — ABNORMAL LOW (ref 8.9–10.3)
Creatinine, Ser: 1.21 mg/dL (ref 0.61–1.24)
GFR calc Af Amer: >60 mL/min (ref >60)
GFR calc non Af Amer: >60 mL/min (ref >60)
Glucose, Bld: 186 mg/dL — ABNORMAL HIGH (ref 65–99)
POTASSIUM: 4.8 mmol/L (ref 3.5–5.1)
Sodium: 135 mmol/L (ref 135–145)

## 2017-10-08 LAB — HEMOGLOBIN AND HEMATOCRIT, BLOOD
HEMATOCRIT: 33.7 % — AB (ref 39.0–52.0)
HEMOGLOBIN: 10.9 g/dL — AB (ref 13.0–17.0)

## 2017-10-08 MED ORDER — TIOTROPIUM BROMIDE MONOHYDRATE 18 MCG IN CAPS
18.0000 ug | ORAL_CAPSULE | Freq: Every day | RESPIRATORY_TRACT | Status: DC
  Filled 2017-10-08: qty 5

## 2017-10-08 NOTE — Discharge Summary (Signed)
Physician Discharge Summary      Patient ID: Brian Graham MRN: [id number] DOB/AGE: 04-18-1951 66 y.o.  Admit date: 10/07/2017 Discharge date: 10/08/2017  Admission Diagnoses: ATROPHIC LEFT PELVIC KIDNEY  Discharge Diagnoses:  Active Problems:   Atrophic kidney   Discharged Condition: good  Hospital Course: He electively underwent left laparoscopic pelvic kidney nephrectomy.  Postoperatively he was noted to be doing well.  He has done well overnight and has minimal discomfort.  He has not had any nausea or vomiting.  He remains afebrile with stable vital signs.  His incisions are healing well and he is felt to be ready for discharge at this time.  Significant Diagnostic Studies: No results found.  Discharge Exam: Blood pressure 128/69, pulse 61, temperature 97.9 F (36.6 C), temperature source Oral, resp. rate 16, height 5\' 9"  (1.753 m), weight 64 kg (141 lb), SpO2 94 %. Alert, oriented and in no distress. Chest normal respiratory effort Cardiovascular regular rate and rhythm Abdomen soft and nontender with laparoscopic port sites free of drainage or sign of infection.  Disposition: 01-Home or Self Care  Discharge Instructions    Discharge patient   Complete by:  As directed    Discharge disposition:  01-Home or Self Care   Discharge patient date:  10/08/2017     Allergies as of 10/08/2017      Reactions   Keflex [cephalexin] Diarrhea, Nausea Only, Other (See Comments)   Insomnia   Sulfa Antibiotics Other (See Comments)   Insomnia   Ciprofloxacin Diarrhea   Levaquin [levofloxacin] Other (See Comments)   insomnia   Omeprazole Diarrhea      Medication List    STOP taking these medications   aspirin 81 MG EC tablet   benzonatate 200 MG capsule Commonly known as:  TESSALON   clopidogrel 75 MG tablet Commonly known as:  PLAVIX     TAKE these medications   acetaminophen 500 MG tablet Commonly known as:  TYLENOL Take 1,000 mg by mouth every 6 (six)  hours as needed for mild pain.   albuterol 108 (90 Base) MCG/ACT inhaler Commonly known as:  PROVENTIL HFA;VENTOLIN HFA Inhale 2 puffs into the lungs every 6 (six) hours as needed for wheezing or shortness of breath.   albuterol (2.5 MG/3ML) 0.083% nebulizer solution Commonly known as:  PROVENTIL Take 3 mLs (2.5 mg total) by nebulization every 6 (six) hours as needed for wheezing or shortness of breath.   amLODipine 5 MG tablet Commonly known as:  NORVASC Take 1 tablet (5 mg total) by mouth daily.   atenolol 50 MG tablet Commonly known as:  TENORMIN Take 1 tablet (50 mg total) by mouth daily.   atorvastatin 80 MG tablet Commonly known as:  LIPITOR Take 80 mg by mouth every evening.   benazepril 20 MG tablet Commonly known as:  LOTENSIN Take 20 mg by mouth daily.   budesonide-formoterol 160-4.5 MCG/ACT inhaler Commonly known as:  SYMBICORT Inhale 2 puffs 2 (two) times daily into the lungs.   HYDROcodone-acetaminophen 5-325 MG tablet Commonly known as:  NORCO Take 1-2 tablets every 6 (six) hours as needed by mouth for moderate pain or severe pain.   nicotine 14 mg/24hr patch Commonly known as:  NICODERM CQ - dosed in mg/24 hours Place 14 mg onto the skin daily.   ondansetron 4 MG disintegrating tablet Commonly known as:  ZOFRAN ODT Allow 1-2 tablets to dissolve in your mouth every 8 hours as needed for nausea/vomiting   Pulse Oximeter Misc 1 application  by Does not apply route as needed.   sertraline 25 MG tablet Commonly known as:  ZOLOFT Take 1 tablet (25 mg total) by mouth daily.   SYNTHROID 125 MCG tablet Generic drug:  levothyroxine Take 125 mcg by mouth daily before breakfast.   tamsulosin 0.4 MG Caps capsule Commonly known as:  FLOMAX Take 0.4 mg by mouth daily as needed (urinary issues).   Tiotropium Bromide Monohydrate 2.5 MCG/ACT Aers Commonly known as:  SPIRIVA RESPIMAT Inhale 2 puffs into the lungs daily.      Follow-up Information    Alexis Frock, MD On 10/24/2017.   Specialty:  Urology Why:  at 12:30PM for MD visit Contact information: [place] [id number] [phone]           Signed: Claybon Jabs 10/08/2017, 8:13 AM

## 2017-10-08 NOTE — Op Note (Signed)
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  PROCEDURE:  Robotic-assisted laparoscopic left radical nephrectomy.  ESTIMATED BLOOD LOSS:  50 mL.  COMPLICATION:  None.  SPECIMEN:  Left atrophic pelvic kidney for permanent pathology.  FINDINGS:  Large hydronephrosis with atrophic left pelvic kidney as anticipated with complex renovascular anatomy including six arteries and three veins.  ASSISTANT:  Debbrah Alar, PA  DRAINS:  Foley catheter to straight drain.  INDICATION:  Brian Graham is a very pleasant 66 year old gentleman with history of recurrent urinary tract infections, who was found to have a left pelvic kidney with hydronephrosis.  He underwent an attempted pyeloplasty in [place], [place] several years ago; however, he has had persistent recurrent urinary tract infections.  He underwent extensive workup in his chronically obstructed left pelvic kidney with hydronephrosis, was felt to be likely nidus.  Further options were discussed including chronic stenting versus observation versus nephrectomy with the latter being most definitive.  It was not felt that any indication for repeat pyeloplasty was warranted given that kidney has less than 20% relative renal function and he proceed with nephrectomy.  Informed consent was obtained and placed in the medical record.  PROCEDURE IN DETAIL:  The patient Brian Graham, was verified. Procedure being left robotic nephrectomy was confirmed.  Procedure was carried out.  Time-out was performed.  Intravenous antibiotics were administered.  General endotracheal anesthesia  was introduced.  The patient was placed into a low lithotomy position.  Sterile field was created by prepping and draping the patient's penis, perineum and proximal thighs using iodine in his infra-xiphoid abdomen using chlorhexidine gluconate.  He was further fashioned to the operating table using 3-inch tape over foam padding across his supraxiphoid chest. A test of steep Trendelenburg positioning was performed, he was found to be suitably positioned.  Next, a high-flow, low-pressure pneumoperitoneum was obtained using Veress technique in the right lateral abdomen approximately 4 fingerbreadths lateral to the umbilicus an area away from prior surgical scars.  Robotic camera port was then placed in the same location.  Laparoscopic examination of the peritoneal cavity revealed minimal adhesions, no visceral injury.  There was large mass effect in the left pelvis consistent with known pelvic kidney. Additional ports were placed as follows:  8-mm camera port approximately 2 fingerbreadths above the umbilicus, 8-mm left paramedian robotic port, left far lateral 8-mm robotic port, right far lateral 12-mm assistant port and a right paramedian 12-mm assistant port to allow additional stapling angulation.  Robot was docked and passed through the electronic checks.  Attention was directed at development of the left pelvic retroperitoneum.  Incision was made lateral to the descending colon toward the area of the sigmoid, this traced the medial aspect of the pelvic kidney.  Next, the retroperitoneum lateral, and inferior and superior to the pelvic kidney was incised.  Attention was directed to identification of the iliac vessels.  The left external iliac vessel was easily identified and it was traced medially to the area of the common iliac vessels toward the area of the aortic bifurcation.  The renovascular anatomy was quite complex as anticipated with multiple arteries and multiple veins in  abnormal locations.  Dominant arterial supply appeared to be from the branching set of arteries and single vein coming superiorly from the area likely of the inferior most aortic bifurcation.  These were controlled using vascular clip proximally and vascular stapler distally.  There were additional set of vessels from the left common iliac, that were controlled similarly with Hem-O-Lok clip proximally, stapling distally, but also containing artery and vein toward the area of the left internal iliac vessels, which were smaller set of vessels that were controlled using Hem-O-Lok only.  The medial aspect of the kidney away from the iliac vessels was carefully mobilized and two additional set of vessels were noted, each controlled with Hem-O- Lok clip proximally and staple distally.  The posterior aspect of the kidney was carefully developed after purposely puncturing into the renal pelvis to allow decompression of the hydronephrosis with more pelvic mobility.  The kidney was raised away from the iliac vessels.  It was carefully swept away from each structures verifying very short. Additional vascular branches were present between these structures, but fortunately that were not.  Superior and lateral medial dissections were inherently performed with the hilar dissection, and inferior dissection was then performed at the inferior aspect, there was more desmoplastic reaction likely consistent with prior surgery and/or recurrent infections.  Kidneys were further mobilized such that the only remaining structure was the ureter, which was in its fairly normal anatomic insertion just lateral to the left medial umbilical ligament into the bladder.  This was doubly clipped and ligated at this point, placing an indicator bag for later retrieval.  Given the ascending portion dissection appeared very close to the posterior bladder, bladder was then filled with 3 mL of saline under laparoscopic vision and  no evidence of leak was noted.  This was again quite fortunate.  Pelvis was inspected, hemostasis was excellent.  All sponge and needle counts were correct.  It was not felt that there was any indication for surgical drain.  The robot was then undocked.  The right lateral most assistant port site was closed at the level of the fascia using Carter-Thomason suture passer and 0 Vicryl.  Specimen was retrieved by extending the previous camera port site superiorly and inferiorly for total distance of approximately 6 cm removing the atrophic kidney setting aside for permanent pathology.  The right paramedian 12-mm assistant port was closed at the level of the fascia using interrupted 0 Vicryl.  The extraction site was then closed at the level of the fascia using figure- of-eight PDS x5, reapproximation of Scarpa's with running Vicryl.  All incision sites were infiltrated with dilute lyophilized Marcaine and then closed at the level of the skin using subcuticular Monocryl followed by Dermabond.  Procedure was then terminated.  The patient tolerated the procedure well.  There were no immediate periprocedural complications.  The patient was taken to the postanesthesia care unit in stable condition.  Please note, first assistant, Debbrah Alar, was absolutely crucial for all robotic portions of the procedure today.  She provided invaluable retraction, vascular clipping, vascular stapling, and suctioning; without which, this would not be possible.          ______________________________ Hubbard Robinson  Tresa Moore, MD     TM/MEDQ  D:  10/07/2017  T:  10/08/2017  Job:  [id number]

## 2017-11-07 ENCOUNTER — Telehealth: Payer: Self-pay | Admitting: Family Medicine

## 2017-11-07 NOTE — Telephone Encounter (Signed)
Please ask Dr. [name] if we can refill these meds. Thanks.

## 2017-11-07 NOTE — Telephone Encounter (Signed)
Copied from [place] [phone]. Topic: Quick Communication - Rx Refill/Question >> Nov 07, 2017  9:45 AM [place], Laban Emperor wrote: Has the patient contacted their pharmacy? No.   (Agent: If no, request that the patient contact the pharmacy for the refill.)   Preferred Pharmacy (with phone number or street name): [facility] - [name], [place] [phone] (Phone) [phone] (Fax)  Pt is requesting a med refill on his atorvastatin (LIPITOR) 80 MG tablet [[id number]], benazepril (LOTENSIN) 20 MG tablet [[id number]] and SYNTHROID 125 MCG tablet [[id number]]. He states Dr. [name] did not order the medication for him, his previous dr did. But he has talked to Dr. [name] about this.   Agent: Please be advised that RX refills may take up to 3 business days. We ask that you follow-up with your pharmacy.

## 2017-11-09 ENCOUNTER — Other Ambulatory Visit: Payer: Self-pay

## 2017-11-09 MED ORDER — SYNTHROID 125 MCG PO TABS: 125.0000 ug | ORAL_TABLET | Freq: Every day | ORAL | 0 refills | Status: DC

## 2017-11-09 MED ORDER — ATORVASTATIN CALCIUM 80 MG PO TABS: 80.0000 mg | ORAL_TABLET | Freq: Every evening | ORAL | 0 refills | Status: DC

## 2017-11-09 MED ORDER — BENAZEPRIL HCL 20 MG PO TABS: 20.0000 mg | ORAL_TABLET | Freq: Every day | ORAL | 0 refills | Status: DC

## 2017-11-09 NOTE — Telephone Encounter (Signed)
Prescriptions have been sent to the patient's pharmacy.

## 2017-11-14 ENCOUNTER — Telehealth: Payer: Self-pay | Admitting: Pulmonary Disease

## 2017-11-14 MED ORDER — DOXYCYCLINE HYCLATE 100 MG PO TABS
100.0000 mg | ORAL_TABLET | Freq: Two times a day (BID) | ORAL | 0 refills | Status: DC
Start: 2017-11-14 — End: 2017-11-21

## 2017-11-14 NOTE — Telephone Encounter (Signed)
Pt reports of chest congestion, prod cough with green mucus & increased sob x1w Denies fever, chills or sweats.  Pt is requesting recommendations.   TP please advise. Thanks.

## 2017-11-14 NOTE — Telephone Encounter (Signed)
Sorry to hear he is sick. If he can not come in to be seen today , then  Can send Doxycycline 100mg  Twice daily  #14 . Take with food, eat yogurt .  Can use mucinex Twice daily  As needed  Cough/congestion  Will need ov if not improving  Please contact office for sooner follow up if symptoms do not improve or worsen or seek emergency care

## 2017-11-17 ENCOUNTER — Telehealth: Payer: Self-pay | Admitting: Family Medicine

## 2017-11-17 NOTE — Telephone Encounter (Signed)
Copied from [place] [phone]. Topic: Appointment Scheduling - Scheduling Inquiry for Clinic >> Nov 17, 2017 12:01 PM Aurelio Brash B wrote: Reason for CRM: PT would like to be worked in to see Dr [name] he is not interested in see another provider-  he had left kidney removed 6 weeks ago,  about 4 -5 days ago  he started experiencing stomach issues - discomfort in stomach, gas, burping.

## 2017-11-18 ENCOUNTER — Ambulatory Visit: Payer: Medicare Other | Admitting: Family Medicine

## 2017-11-18 NOTE — Telephone Encounter (Signed)
Called and scheduled patient for an appointment on 11/21/17

## 2017-11-21 ENCOUNTER — Ambulatory Visit: Payer: Medicare Other | Admitting: Family Medicine

## 2017-11-21 ENCOUNTER — Encounter: Payer: Self-pay | Admitting: Family Medicine

## 2017-11-21 VITALS — BP 128/72 | HR 66 | Temp 97.8°F | Ht 69.0 in | Wt 139.6 lb

## 2017-11-21 DIAGNOSIS — J449 Chronic obstructive pulmonary disease, unspecified: Secondary | ICD-10-CM

## 2017-11-21 DIAGNOSIS — I10 Essential (primary) hypertension: Secondary | ICD-10-CM

## 2017-11-21 DIAGNOSIS — E039 Hypothyroidism, unspecified: Secondary | ICD-10-CM

## 2017-11-21 DIAGNOSIS — K5900 Constipation, unspecified: Secondary | ICD-10-CM | POA: Diagnosis not present

## 2017-11-21 MED ORDER — PREDNISONE 20 MG PO TABS: ORAL_TABLET | ORAL | 0 refills | Status: DC

## 2017-11-21 NOTE — Assessment & Plan Note (Signed)
S: controlled on repeat on Amlodipine 5mg , atenolol 50mg  BID, benazepril 20mg  BP Readings from Last 3 Encounters:  11/21/17 128/72  10/08/17 (!) 126/55  10/05/17 (!) 155/81  A/P: We discussed blood pressure goal of <140/90. Continue current meds

## 2017-11-21 NOTE — Assessment & Plan Note (Addendum)
Lab Results  Component Value Date   TSH 3.42 04/22/2017  compliant with synthroid 125 mcg and TSH has been controlled- doubt abdominal pain or constipation are related to this. If persistent issues would update tsh

## 2017-11-21 NOTE — Assessment & Plan Note (Signed)
Recently treated with course of doxycycline through pulmonary. Congestion green to clear. Still with some chest congestion. Some wheezing coming and going. Will add 7 day course of prednisone. Follow up if not improving.

## 2017-11-21 NOTE — Patient Instructions (Addendum)
Prednisone for 1 week to see if it will help with chest congestion  Trial zantac 150mg  twice a day for 1 month to see if that will help with belching/burping  For lower abdominal pain- could be constipation related- lets try miralax half capful once a day. Hold for 1-2 days if have loose stools.   Check back in 1 month from now if not improved  Watch that one area on stomach- may need to see Dr. Tresa Moore if not improving

## 2017-11-21 NOTE — Progress Notes (Signed)
Subjective:  Brian Graham is a 66 y.o. year old very pleasant male patient who presents for/with See problem oriented charting ROS- no fever, chills. Does have constipation and abdominal pain.    Past Medical History-  Patient Active Problem List   Diagnosis Date Noted  . Atherosclerosis of native arteries of extremity with intermittent claudication ([place]) 07/06/2017    Priority: High  . Weight loss 04/23/2017    Priority: High  . Smoker 09/01/2016    Priority: High  . COPD, severe ([place]) 08/23/2016    Priority: High  . Hx of CABG 08/05/2016    Priority: High  . CAD (coronary artery disease) of artery bypass graft 08/05/2016    Priority: High  . History of adenomatous polyp of colon 04/22/2017    Priority: Medium  . Hyperlipidemia 04/04/2017    Priority: Medium  . Hypothyroidism (acquired) 04/04/2017    Priority: Medium  . Essential hypertension 08/05/2016    Priority: Medium  . Essential tremor 06/11/2016    Priority: Medium  . Aortic atherosclerosis ([place]) 04/13/2017    Priority: Low  . Seasonal allergies 04/04/2017    Priority: Low  . Myalgia 10/04/2016    Priority: Low  . Dysuria 08/23/2016    Priority: Low  . Chronic pain of both knees 08/23/2016    Priority: Low  . Tension headache 06/11/2016    Priority: Low  . 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 Urinary tract infection 09/03/2017  . Constipation 09/03/2017  . Anxiety 07/24/2017  . Pyelonephritis 04/23/2017    Medications- reviewed and updated Current Outpatient Medications  Medication Sig Dispense Refill  . acetaminophen (TYLENOL) 500 MG tablet Take 1,000 mg by mouth every 6 (six) hours as needed for mild pain.    Marland Kitchen albuterol (PROVENTIL HFA;VENTOLIN HFA) 108 (90 Base) MCG/ACT inhaler Inhale 2 puffs into the lungs every 6 (six) hours as needed for wheezing or shortness of breath.    Marland Kitchen albuterol (PROVENTIL) (2.5 MG/3ML) 0.083% nebulizer solution Take 3 mLs (2.5 mg  total) by nebulization every 6 (six) hours as needed for wheezing or shortness of breath. 360 mL 11  . amLODipine (NORVASC) 5 MG tablet Take 1 tablet (5 mg total) by mouth daily. 90 tablet 3  . atenolol (TENORMIN) 50 MG tablet Take 1 tablet (50 mg total) by mouth daily. 90 tablet 3  . atorvastatin (LIPITOR) 80 MG tablet Take 1 tablet (80 mg total) by mouth every evening. 90 tablet 0  . benazepril (LOTENSIN) 20 MG tablet Take 1 tablet (20 mg total) by mouth daily. 90 tablet 0  . budesonide-formoterol (SYMBICORT) 160-4.5 MCG/ACT inhaler Inhale 2 puffs 2 (two) times daily into the lungs. 3 Inhaler 1  . Misc. Devices (PULSE OXIMETER) MISC 1 application by Does not apply route as needed. 1 each 0  . nicotine (NICODERM CQ - DOSED IN MG/24 HOURS) 14 mg/24hr patch Place 14 mg onto the skin daily.    . ondansetron (ZOFRAN ODT) 4 MG disintegrating tablet Allow 1-2 tablets to dissolve in your mouth every 8 hours as needed for nausea/vomiting 30 tablet 0  . SYNTHROID 125 MCG tablet Take 1 tablet (125 mcg total) by mouth daily before breakfast. 90 tablet 0  . Tiotropium Bromide Monohydrate (SPIRIVA RESPIMAT) 2.5 MCG/ACT AERS Inhale 2 puffs into the lungs daily. 3 Inhaler 3  . predniSONE (DELTASONE) 20 MG tablet Take 2 pills for 3 days, 1 pill for 4 days 10 tablet 0   No current  facility-administered medications for this visit.     Objective: BP 128/72   Pulse 66   Temp 97.8 F (36.6 C) (Oral)   Ht 5\' 9"  (1.753 m)   Wt 139 lb 9.6 oz (63.3 kg)   SpO2 93%   BMI 20.62 kg/m  Gen: NAD, resting comfortably, occasional deep cough CV: RRR no murmurs rubs or gallops Lungs: CTAB no crackles, wheeze, rhonchi Abdomen: soft/mild diffuse tenderness- moderate in lower abdomen/nondistended/normal bowel sounds. No rebound or guarding.  Multiple surgical scars noted- one just below epigastric region with small <1 x 1 cm appears to be clear fluid filled sac- nontender.  Ext: no edema  Assessment/Plan:  Lower  abdominal pain Constipation Burping/belching S:  5 years ago when originally had kidney issues- had burping, gas issues at the time- was taking align at that time and didn't really help. PPI may have helped short term  Had laparascopic nephrectomy about 6 weeks. Had been taking hydrocodone and vicodin. Last dose would have been 5 weeks ago. Had stent placed for PAD prior to surgery.   Since that time has had issues including- Burping, gas, some stomach discomfort particularly lower abdomen and not above scars. Dealth with constipation for a while- now much better. Took dulcolax and generic colace. Still doing colace. Stopped the dulcolax- this seemed to help some. Tums helps some.  A/P: Suspect GERD as well as constipation related issues. Miralax 2 week trial and zantac 4 week trial. He had diarrhea on PPI in past. Discussed sooner follow up if new or worsening symptoms.   Essential hypertension S: controlled on repeat on Amlodipine 5mg , atenolol 50mg  BID, benazepril 20mg  BP Readings from Last 3 Encounters:  11/21/17 128/72  10/08/17 (!) 126/55  10/05/17 (!) 155/81  A/P: We discussed blood pressure goal of <140/90. Continue current meds  COPD, severe ([place]) Recently treated with course of doxycycline through pulmonary. Congestion green to clear. Still with some chest congestion. Some wheezing coming and going. Will add 7 day course of prednisone. Follow up if not improving.   Hypothyroidism (acquired) Lab Results  Component Value Date   TSH 3.42 04/22/2017  compliant with synthroid 125 mcg and TSH has been controlled- doubt abdominal pain or constipation are related to this. If persistent issues would update tsh  Future Appointments  Date Time Provider [place]  12/15/2017  9:00 AM Juanito Doom, MD [facility] None  01/25/2018 10:00 AM [facility]  01/25/2018 11:00 AM [facility]  01/25/2018 11:30 AM Conrad Lemoyne, MD [facility]   Meds  ordered this encounter  Medications  . predniSONE (DELTASONE) 20 MG tablet    Sig: Take 2 pills for 3 days, 1 pill for 4 days    Dispense:  10 tablet    Refill:  0   Return precautions advised.  Garret Reddish, MD

## 2017-12-15 ENCOUNTER — Encounter: Payer: Self-pay | Admitting: Pulmonary Disease

## 2017-12-15 ENCOUNTER — Ambulatory Visit: Payer: Medicare Other | Admitting: Pulmonary Disease

## 2017-12-15 ENCOUNTER — Other Ambulatory Visit (INDEPENDENT_AMBULATORY_CARE_PROVIDER_SITE_OTHER): Payer: Medicare Other

## 2017-12-15 VITALS — BP 128/62 | HR 69 | Ht 69.0 in | Wt 141.2 lb

## 2017-12-15 DIAGNOSIS — J9611 Chronic respiratory failure with hypoxia: Secondary | ICD-10-CM | POA: Diagnosis not present

## 2017-12-15 DIAGNOSIS — J302 Other seasonal allergic rhinitis: Secondary | ICD-10-CM

## 2017-12-15 DIAGNOSIS — J441 Chronic obstructive pulmonary disease with (acute) exacerbation: Secondary | ICD-10-CM | POA: Diagnosis not present

## 2017-12-15 DIAGNOSIS — J449 Chronic obstructive pulmonary disease, unspecified: Secondary | ICD-10-CM | POA: Diagnosis not present

## 2017-12-15 LAB — CBC WITH DIFFERENTIAL/PLATELET
BASOS ABS: 0.1 10*3/uL (ref 0.0–0.1)
Basophils Relative: 0.6 % (ref 0.0–3.0)
EOS ABS: 0.3 10*3/uL (ref 0.0–0.7)
Eosinophils Relative: 3.6 % (ref 0.0–5.0)
HEMATOCRIT: 41.4 % (ref 39.0–52.0)
HEMOGLOBIN: 13.7 g/dL (ref 13.0–17.0)
LYMPHS PCT: 13.3 % (ref 12.0–46.0)
Lymphs Abs: 1.2 10*3/uL (ref 0.7–4.0)
MCHC: 33.2 g/dL (ref 30.0–36.0)
MCV: 91.5 fl (ref 78.0–100.0)
MONOS PCT: 8.7 % (ref 3.0–12.0)
Monocytes Absolute: 0.8 10*3/uL (ref 0.1–1.0)
NEUTROS ABS: 6.7 10*3/uL (ref 1.4–7.7)
Neutrophils Relative %: 73.8 % (ref 43.0–77.0)
PLATELETS: 262 10*3/uL (ref 150.0–400.0)
RBC: 4.52 Mil/uL (ref 4.22–5.81)
RDW: 15.9 % — ABNORMAL HIGH (ref 11.5–15.5)
WBC: 9 10*3/uL (ref 4.0–10.5)

## 2017-12-15 MED ORDER — PREDNISONE 20 MG PO TABS: 20.0000 mg | ORAL_TABLET | Freq: Every day | ORAL | 0 refills | Status: DC

## 2017-12-15 MED ORDER — ALBUTEROL SULFATE HFA 108 (90 BASE) MCG/ACT IN AERS: 2.0000 | INHALATION_SPRAY | Freq: Four times a day (QID) | RESPIRATORY_TRACT | 3 refills | Status: DC | PRN

## 2017-12-15 NOTE — Patient Instructions (Signed)
COPD exacerbation: Take prednisone 20 mg daily times 5 days We will collect a sample of your mucus and send it for culture for bacteria, fungus, AFB Call us if you are not feeling better Use albuterol as needed for chest tightness wheezing or shortness of breath Continue Symbicort Continue Spiriva On your next visit we may need to consider treating you with a daily antibiotic  We will see you back in 6-8 weeks or sooner if needed

## 2017-12-15 NOTE — Progress Notes (Signed)
Subjective:    Patient ID: Brian Graham, male    DOB: 11/22/1951, 67 y.o.   MRN: [id number]  Synopsis: First referred in 2017 for severe COPD after being followed by the pulmonary group in [place] before moving to [place]. Smoked 40 years, quit in 2012   HPI Chief Complaint  Patient presents with  . Follow-up    pt c/o sinus congestion, pnd, prod cough with green mucus X1 day.     Brian as been coughing up green mucus for two days after he caught a cold from his granddaughter.  She was also sick. He said that a few weeks back he had to take prednisone for a minor flare.  He has had a lot of health problems lately with an iliac stent and nephrectomy.  He had the nephrectomy because of recurrent urinary tract infections.    Still taking symbicort and spiriva.     Past Medical History:  Diagnosis Date  . Anxiety   . Arthritis    "left knee" (08/05/2016)  . Bruises easily   . CAD (coronary artery disease) of artery bypass graft 08/05/2016   Around age 64. CABG - 3 vessel.   . Chronic kidney disease   . Colon polyp   . COPD, severe ([place]) 08/23/2016   Alpha 1 studies normal per prior pulmonary group notes Smoked 40 years, quit in 2012 June 2016 PFT from prior pulmonary group: "significant obstruction" FEV1 1.58L (47% pred), Residual volume 171% pred, DLCO 59% pred Simple Spirometry>> 09/15/2016 ratio 42% FEV1 1.02 L / 31%   . Essential tremor 06/11/2016   Plans to see Dr. Carles Collet  . Former smoker 09/01/2016   Quit 2012. 40 pack years at least.   . GERD (gastroesophageal reflux disease)   . Headache   . History of blood transfusion 08/1997   "when he had his heart surgery"  . History of shingles 1970-2013 X 3  . HTN (hypertension) 08/05/2016   Amlodipine 5mg , atenolol 50mg  BID, benazepril 20mg   . Hyperlipemia   . Hypothyroidism   . Lumbar disc disease   . Myocardial infarction ([place]) 08/1997  . Peripheral vascular disease ([place])   . PONV (postoperative nausea and  vomiting)   . Urinary tract infection 09/03/2017  . Wears glasses       Review of Systems  Constitutional: Negative for chills, fatigue and fever.  HENT: Negative for nosebleeds, postnasal drip and rhinorrhea.   Respiratory: Positive for cough, shortness of breath and wheezing.   Cardiovascular: Negative for chest pain, palpitations and leg swelling.       Objective:   Physical Exam  Vitals:   12/15/17 0853  BP: 128/62  Pulse: 69  SpO2: 96%  Weight: 141 lb 3.2 oz (64 kg)  Height: 5\' 9"  (1.753 m)    Gen: mildly ill appearing HENT: OP clear, TM's clear, neck supple PULM: wheezing bilaterally B, normal percussion, normal CV: RRR, no mgr, trace edema GI: BS+, soft, nontender Derm: no cyanosis or rash Psyche: normal mood and affect    CBC    Component Value Date/Time   WBC 12.0 (H) 10/05/2017 0930   RBC 4.09 (L) 10/05/2017 0930   HGB 10.9 (L) 10/08/2017 0601   HCT 33.7 (L) 10/08/2017 0601   PLT 431 (H) 10/05/2017 0930   MCV 93.9 10/05/2017 0930   MCH 30.3 10/05/2017 0930   MCHC 32.3 10/05/2017 0930   RDW 14.8 10/05/2017 0930   LYMPHSABS 2.2 08/13/2017 0117   MONOABS 1.3 (H)  08/13/2017 0117   EOSABS 0.2 08/13/2017 0117   BASOSABS 0.1 08/13/2017 0117   Labss: Alpha 1 studies normal per prior pulmonary group notes 2014 M-M  Chest imaging: Images from May 2018 CT chest independently reviewed showing moderate to severe emphysema, airway thickening but no mass or lesion August 2018 chest x-ray images independently reviewed showing emphysema, no mass, no infiltrate  PFT: June 2016 PFT from prior pulmonary group: "significant obstruction" FEV1 1.58L (47% pred), Residual volume 171% pred, DLCO 59% pred Simple Spirometry>> 09/15/2016 ratio 42% FEV1 1.02 L / 31%     Assessment & Plan:   Seasonal allergies - Plan: CBC w/Diff, IgE, Fungus Culture & Smear, Respiratory or Resp and Sputum Culture, AFB Culture & Smear  COPD, severe (HCC)  Chronic respiratory failure  with hypoxia (HCC)  COPD with acute exacerbation (HCC)  *Discussion: Unfortunately Mr. Graham has had repeated exacerbations of COPD this year.  This has a poor prognosis.   The differential diagnosis includes an allergy of some sort (like ABPA) versus less likely an atypical infection.  It is also just possible that he has had bad luck and has encountered colds repeatedly in the setting of severe COPD.  We need to evaluate for an underlying allergy and atypical infection.  He may need to start on a daily antibiotic like azithromycin or something like Reflumilast.  Plan: COPD exacerbation: Take prednisone 20 mg daily times 5 days We will collect a sample of your mucus and send it for culture for bacteria, fungus, AFB Call us if you are not feeling better Use albuterol as needed for chest tightness wheezing or shortness of breath Continue Symbicort Continue Spiriva On your next visit we may need to consider treating you with a daily antibiotic  We will see you back in 6-8 weeks or sooner if needed   Current Outpatient Medications:  .  acetaminophen (TYLENOL) 500 MG tablet, Take 1,000 mg by mouth every 6 (six) hours as needed for mild pain., Disp: , Rfl:  .  albuterol (PROVENTIL HFA;VENTOLIN HFA) 108 (90 Base) MCG/ACT inhaler, Inhale 2 puffs into the lungs every 6 (six) hours as needed for wheezing or shortness of breath., Disp: 3 Inhaler, Rfl: 3 .  albuterol (PROVENTIL) (2.5 MG/3ML) 0.083% nebulizer solution, Take 3 mLs (2.5 mg total) by nebulization every 6 (six) hours as needed for wheezing or shortness of breath., Disp: 360 mL, Rfl: 11 .  amLODipine (NORVASC) 5 MG tablet, Take 1 tablet (5 mg total) by mouth daily., Disp: 90 tablet, Rfl: 3 .  atenolol (TENORMIN) 50 MG tablet, Take 1 tablet (50 mg total) by mouth daily., Disp: 90 tablet, Rfl: 3 .  atorvastatin (LIPITOR) 80 MG tablet, Take 1 tablet (80 mg total) by mouth every evening., Disp: 90 tablet, Rfl: 0 .  benazepril (LOTENSIN) 20 MG  tablet, Take 1 tablet (20 mg total) by mouth daily., Disp: 90 tablet, Rfl: 0 .  budesonide-formoterol (SYMBICORT) 160-4.5 MCG/ACT inhaler, Inhale 2 puffs 2 (two) times daily into the lungs., Disp: 3 Inhaler, Rfl: 1 .  Misc. Devices (PULSE OXIMETER) MISC, 1 application by Does not apply route as needed., Disp: 1 each, Rfl: 0 .  nicotine (NICODERM CQ - DOSED IN MG/24 HOURS) 14 mg/24hr patch, Place 14 mg onto the skin daily., Disp: , Rfl:  .  ondansetron (ZOFRAN ODT) 4 MG disintegrating tablet, Allow 1-2 tablets to dissolve in your mouth every 8 hours as needed for nausea/vomiting, Disp: 30 tablet, Rfl: 0 .  SYNTHROID 125 MCG  tablet, Take 1 tablet (125 mcg total) by mouth daily before breakfast., Disp: 90 tablet, Rfl: 0 .  Tiotropium Bromide Monohydrate (SPIRIVA RESPIMAT) 2.5 MCG/ACT AERS, Inhale 2 puffs into the lungs daily., Disp: 3 Inhaler, Rfl: 3 .  predniSONE (DELTASONE) 20 MG tablet, Take 1 tablet (20 mg total) by mouth daily with breakfast., Disp: 5 tablet, Rfl: 0

## 2017-12-16 ENCOUNTER — Other Ambulatory Visit: Payer: Medicare Other

## 2017-12-16 DIAGNOSIS — J301 Allergic rhinitis due to pollen: Secondary | ICD-10-CM

## 2017-12-16 DIAGNOSIS — J302 Other seasonal allergic rhinitis: Secondary | ICD-10-CM

## 2017-12-16 LAB — IGE: IGE (IMMUNOGLOBULIN E), SERUM: 147 kU/L — AB (ref <=114)

## 2017-12-19 LAB — RESPIRATORY CULTURE OR RESPIRATORY AND SPUTUM CULTURE
MICRO NUMBER: 90108954
RESULT:: NORMAL
SPECIMEN QUALITY:: ADEQUATE

## 2018-01-07 ENCOUNTER — Ambulatory Visit (INDEPENDENT_AMBULATORY_CARE_PROVIDER_SITE_OTHER): Payer: Medicare Other | Admitting: Family Medicine

## 2018-01-07 ENCOUNTER — Other Ambulatory Visit: Payer: Self-pay

## 2018-01-07 ENCOUNTER — Encounter: Payer: Self-pay | Admitting: Family Medicine

## 2018-01-07 VITALS — BP 140/70 | HR 73 | Temp 98.7°F | Resp 16 | Wt 145.0 lb

## 2018-01-07 DIAGNOSIS — J441 Chronic obstructive pulmonary disease with (acute) exacerbation: Secondary | ICD-10-CM

## 2018-01-07 MED ORDER — PREDNISONE 20 MG PO TABS: 40.0000 mg | ORAL_TABLET | Freq: Every day | ORAL | 0 refills | Status: AC

## 2018-01-07 MED ORDER — DOXYCYCLINE HYCLATE 100 MG PO TABS: 100.0000 mg | ORAL_TABLET | Freq: Two times a day (BID) | ORAL | 0 refills | Status: DC

## 2018-01-07 NOTE — Patient Instructions (Addendum)
Continue using your inhalers daily. You should probably schedule an appointment to follow-up with your pulmonologist in the next week. Chronic Obstructive Pulmonary Disease Exacerbation Chronic obstructive pulmonary disease (COPD) is a long-term (chronic) condition that affects the lungs. COPD is a general term that can be used to describe many different lung problems that cause lung swelling (inflammation) and limit airflow, including chronic bronchitis and emphysema. COPD exacerbations are episodes when breathing symptoms become much worse and require extra treatment. COPD exacerbations are usually caused by infections. Without treatment, COPD exacerbations can be severe and even life threatening. Frequent COPD exacerbations can cause further damage to the lungs. What are the causes? This condition may be caused by:  Respiratory infections, including viral and bacterial infections.  Exposure to smoke.  Exposure to air pollution, chemical fumes, or dust.  Things that give you an allergic reaction (allergens).  Not taking your usual COPD medicines as directed.  Underlying medical problems, such as congestive heart failure or infections not involving the lungs.  In many cases, the cause (trigger) of this condition is not known. What increases the risk? The following factors may make you more likely to develop this condition:  Smoking cigarettes.  Old age.  Frequent prior COPD exacerbations.  What are the signs or symptoms? Symptoms of this condition include:  Increased coughing.  Increased production of mucus from your lungs (sputum).  Increased wheezing.  Increased shortness of breath.  Rapid or labored breathing.  Chest tightness.  Less energy than usual.  Sleep disruption from symptoms.  Confusion or increased sleepiness.  Often these symptoms happen or get worse even with the use of medicines. How is this diagnosed? This condition is diagnosed based on:  Your  medical history.  A physical exam.  You may also have tests, including:  A chest X-ray.  Blood tests.  Lung (pulmonary) function tests.  How is this treated? Treatment for this condition depends on the severity and cause of the symptoms. You may need to be admitted to a hospital for treatment. Some of the treatments commonly used to treat COPD exacerbations are:  Antibiotic medicines. These may be used for severe exacerbations caused by a lung infection, such as pneumonia.  Bronchodilators. These are inhaled medicines that expand the air passages and allow increased airflow.  Steroid medicines. These act to reduce inflammation in the airways. They may be given with an inhaler, taken by mouth, or given through an IV tube inserted into one of your veins.  Supplemental oxygen therapy.  Airway clearing techniques, such as noninvasive ventilation (NIV) and positive expiratory pressure (PEP). These provide respiratory support through a mask or other noninvasive device. An example of this would be using a continuous positive airway pressure (CPAP) machine to improve delivery of oxygen into your lungs.  Follow these instructions at home: Medicines  Take over-the-counter and prescription medicines only as told by your health care provider. It is important to use correct technique with inhaled medicines.  If you were prescribed an antibiotic medicine or oral steroid, take it as told by your health care provider. Do not stop taking the medicine even if you start to feel better. Lifestyle  Eat a healthy diet.  Exercise regularly.  Get plenty of sleep.  Avoid exposure to all substances that irritate the airway, especially to tobacco smoke.  Wash your hands often with soap and water to reduce the risk of infection. If soap and water are not available, use hand sanitizer.  During flu season, avoid enclosed  spaces that are crowded with people. General instructions  Drink enough fluid to  keep your urine clear or pale yellow (unless you have a medical condition that requires fluid restriction).  Use a cool mist vaporizer. This humidifies the air and makes it easier for you to clear your chest when you cough.  If you have a home nebulizer and oxygen, continue to use them as told by your health care provider.  Keep all follow-up visits as told by your health care provider. This is important. How is this prevented?  Stay up-to-date on pneumococcal and influenza (flu) vaccines. A flu shot is recommended every year to help prevent exacerbations.  Do not use any products that contain nicotine or tobacco, such as cigarettes and e-cigarettes. Quitting smoking is very important in preventing COPD from getting worse and in preventing exacerbations from happening as often. If you need help quitting, ask your health care provider.  Follow all instructions for pulmonary rehabilitation after a recent exacerbation. This can help prevent future exacerbations.  Work with your health care provider to develop and follow an action plan. This tells you what steps to take when you experience certain symptoms. Contact a health care provider if:  You have a worsening of your regular COPD symptoms. Get help right away if:  You have worsening shortness of breath, even when resting.  You have trouble talking.  You have severe chest pain.  You cough up blood.  You have a fever.  You have weakness, vomit repeatedly, or faint.  You feel confused.  You are not able to sleep because of your symptoms.  You have trouble doing daily activities. Summary  COPD exacerbations are episodes when breathing symptoms become much worse and require extra treatment above your normal treatment.  Exacerbations can be severe and even life threatening. Frequent COPD exacerbations can cause further damage to your lungs.  COPD exacerbations are usually triggered by infections such as the flu, colds, and even  pneumonia.  Treatment for this condition depends on the severity and cause of the symptoms. You may need to be admitted to a hospital for treatment.  Quitting smoking is very important to prevent COPD from getting worse and to prevent exacerbations from happening as often. This information is not intended to replace advice given to you by your health care provider. Make sure you discuss any questions you have with your health care provider. Document Released: 09/05/2007 Document Revised: 12/13/2016 Document Reviewed: 12/13/2016 Elsevier Interactive Patient Education  [employer].

## 2018-01-07 NOTE — Progress Notes (Signed)
Subjective:    Patient ID: Brian Graham, male    DOB: [birth year], 67 y.o.   MRN: [id number]  Chief Complaint  Patient presents with  . Cough  Accompanied by his wife.  HPI Patient was seen today for acute concern.  Patient endorses 1 day of chest congestion, coughing up red brown sputum.  Patient has a history of COPD.  He is a former smoker.  Patient is followed by pulmonology.  Patient states he has had 4 colds since December.  Patient has a h/o recent abx use and multiple allergies to meds typically used for pulmonary infections.  Past Medical History:  Diagnosis Date  . Anxiety   . Arthritis    "left knee" (08/05/2016)  . Bruises easily   . CAD (coronary artery disease) of artery bypass graft 08/05/2016   Around age 83. CABG - 3 vessel.   . Chronic kidney disease   . Colon polyp   . COPD, severe ([place]) 08/23/2016   Alpha 1 studies normal per prior pulmonary group notes Smoked 40 years, quit in 2012 June 2016 PFT from prior pulmonary group: "significant obstruction" FEV1 1.58L (47% pred), Residual volume 171% pred, DLCO 59% pred Simple Spirometry>> 09/15/2016 ratio 42% FEV1 1.02 L / 31%   . Essential tremor 06/11/2016   Plans to see Dr. Carles Collet  . Former smoker 09/01/2016   Quit 2012. 40 pack years at least.   . GERD (gastroesophageal reflux disease)   . Headache   . History of blood transfusion 08/1997   "when he had his heart surgery"  . History of shingles 1970-2013 X 3  . HTN (hypertension) 08/05/2016   Amlodipine 5mg , atenolol 50mg  BID, benazepril 20mg   . Hyperlipemia   . Hypothyroidism   . Lumbar disc disease   . Myocardial infarction ([place]) 08/1997  . Peripheral vascular disease ([place])   . PONV (postoperative nausea and vomiting)   . Urinary tract infection 09/03/2017  . Wears glasses     Allergies  Allergen Reactions  . Keflex [Cephalexin] Diarrhea, Nausea Only and Other (See Comments)    Insomnia  . Sulfa Antibiotics Other (See Comments)    Insomnia  .  Ciprofloxacin Diarrhea  . Levaquin [Levofloxacin] Other (See Comments)    insomnia  . Omeprazole Diarrhea    ROS General: Denies fever, chills, night sweats, changes in weight, changes in appetite HEENT: Denies headaches, ear pain, changes in vision, rhinorrhea, sore throat CV: Denies CP, palpitations, SOB, orthopnea Pulm: Denies SOB, wheezing   +cough, increased sputum production, change in sputum color GI: Denies abdominal pain, nausea, vomiting, diarrhea, constipation GU: Denies dysuria, hematuria, frequency, vaginal discharge Msk: Denies muscle cramps, joint pains Neuro: Denies weakness, numbness, tingling Skin: Denies rashes, bruising Psych: Denies depression, anxiety, hallucinations     Objective:    Blood pressure 140/70, pulse 73, temperature 98.7 F (37.1 C), temperature source Oral, resp. rate 16, weight 145 lb (65.8 kg), SpO2 93 %.   Gen. Pleasant, well-nourished, in no distress, normal affect   HEENT: [place]/AT, face symmetric, PERRLA, nares patent without drainage, pharynx without erythema or exudate. TMs nml b/l.  No cervical lymphadenopathy Lungs: no accessory muscle use, CTAB, faint wheezes in RL base Cardiovascular: RRR, no m/r/g, no peripheral edema Neuro:  A&Ox3, CN II-XII intact, normal gait   Wt Readings from Last 3 Encounters:  01/07/18 145 lb (65.8 kg)  12/15/17 141 lb 3.2 oz (64 kg)  11/21/17 139 lb 9.6 oz (63.3 kg)    Lab Results  Component  Value Date   WBC 9.0 12/15/2017   HGB 13.7 12/15/2017   HCT 41.4 12/15/2017   PLT 262.0 12/15/2017   GLUCOSE 186 (H) 10/08/2017   CHOL 141 05/06/2017   TRIG 88 05/06/2017   HDL 55 05/06/2017   LDLCALC 68 05/06/2017   ALT 20 08/13/2017   AST 24 08/13/2017   NA 135 10/08/2017   K 4.8 10/08/2017   CL 98 (L) 10/08/2017   CREATININE 1.21 10/08/2017   BUN 17 10/08/2017   CO2 29 10/08/2017   TSH 3.42 04/22/2017   HGBA1C 6.1 04/22/2017    Assessment/Plan:  COPD exacerbation (HCC)  -former smoker -Pt  with allergy to Cipro, Levaquin, Keflex, Sulfa, omeprazole -recent abx use. -Continue inhalers: albuterol, symbicort, spiriva -Advised to f/u with Pulmonologist--Dr. Lake Bells next wk. -will start prednisone and doxy - Plan: doxycycline (VIBRA-TABS) 100 MG tablet, predniSONE (DELTASONE) 20 MG tablet  F/u prn.  Grier Mitts, MD

## 2018-01-11 ENCOUNTER — Telehealth: Payer: Self-pay | Admitting: Family Medicine

## 2018-01-11 NOTE — Telephone Encounter (Deleted)
PER [facility]  Caller states he has COPD, he is coughing up reddish-brown secretions and his chest feels full. Denies increased sob. He was seen 3 weeks ago and had a could but no abx were ordered. These sx began on Friday.

## 2018-01-12 ENCOUNTER — Encounter: Payer: Self-pay | Admitting: Family Medicine

## 2018-01-12 ENCOUNTER — Ambulatory Visit: Payer: Medicare Other | Admitting: Family Medicine

## 2018-01-12 VITALS — BP 132/68 | HR 68 | Temp 97.5°F | Ht 69.0 in | Wt 142.4 lb

## 2018-01-12 DIAGNOSIS — J449 Chronic obstructive pulmonary disease, unspecified: Secondary | ICD-10-CM

## 2018-01-12 DIAGNOSIS — R6883 Chills (without fever): Secondary | ICD-10-CM

## 2018-01-12 DIAGNOSIS — J441 Chronic obstructive pulmonary disease with (acute) exacerbation: Secondary | ICD-10-CM

## 2018-01-12 LAB — POC INFLUENZA A&B (BINAX/QUICKVUE)
INFLUENZA B, POC: NEGATIVE
Influenza A, POC: NEGATIVE

## 2018-01-12 MED ORDER — IPRATROPIUM-ALBUTEROL 0.5-2.5 (3) MG/3ML IN SOLN
3.0000 mL | Freq: Four times a day (QID) | RESPIRATORY_TRACT | Status: DC
Start: 2018-01-12 — End: 2018-01-12

## 2018-01-12 MED ORDER — IPRATROPIUM-ALBUTEROL 0.5-2.5 (3) MG/3ML IN SOLN
3.0000 mL | Freq: Once | RESPIRATORY_TRACT | Status: AC
  Administered 2018-01-12: 3 mL via RESPIRATORY_TRACT

## 2018-01-12 MED ORDER — AMOXICILLIN-POT CLAVULANATE 875-125 MG PO TABS: 1.0000 | ORAL_TABLET | Freq: Two times a day (BID) | ORAL | 0 refills | Status: AC

## 2018-01-12 MED ORDER — IPRATROPIUM-ALBUTEROL 0.5-2.5 (3) MG/3ML IN SOLN: 3.0000 mL | Freq: Four times a day (QID) | RESPIRATORY_TRACT | 1 refills | Status: DC | PRN

## 2018-01-12 MED ORDER — PREDNISONE 50 MG PO TABS: 50.0000 mg | ORAL_TABLET | Freq: Every day | ORAL | 0 refills | Status: DC

## 2018-01-12 NOTE — Progress Notes (Signed)
Subjective:  Brian Graham is a 67 y.o. year old very pleasant male patient who presents for/with See problem oriented charting ROS- complains of anxiety, shortness of breath, oxygen into mid 80s without home oxygen . Denies chest pain other than at times with cough, thick green sputum which is atypical for him  Past Medical History-  Patient Active Problem List   Diagnosis Date Noted  . Atherosclerosis of native arteries of extremity with intermittent claudication ([place]) 07/06/2017    Priority: High  . Weight loss 04/23/2017    Priority: High  . Smoker 09/01/2016    Priority: High  . COPD, severe ([place]) 08/23/2016    Priority: High  . Hx of CABG 08/05/2016    Priority: High  . CAD (coronary artery disease) of artery bypass graft 08/05/2016    Priority: High  . History of adenomatous polyp of colon 04/22/2017    Priority: Medium  . Hyperlipidemia 04/04/2017    Priority: Medium  . Hypothyroidism (acquired) 04/04/2017    Priority: Medium  . Essential hypertension 08/05/2016    Priority: Medium  . Essential tremor 06/11/2016    Priority: Medium  . Aortic atherosclerosis ([place]) 04/13/2017    Priority: Low  . Seasonal allergies 04/04/2017    Priority: Low  . Myalgia 10/04/2016    Priority: Low  . Dysuria 08/23/2016    Priority: Low  . Chronic pain of both knees 08/23/2016    Priority: Low  . Tension headache 06/11/2016    Priority: Low  . 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 Urinary tract infection 09/03/2017  . Constipation 09/03/2017  . Anxiety 07/24/2017  . Pyelonephritis 04/23/2017    Medications- reviewed and updated Current Outpatient Medications  Medication Sig Dispense Refill  . acetaminophen (TYLENOL) 500 MG tablet Take 1,000 mg by mouth every 6 (six) hours as needed for mild pain.    Marland Kitchen albuterol (PROVENTIL HFA;VENTOLIN HFA) 108 (90 Base) MCG/ACT inhaler Inhale 2 puffs into the lungs every 6 (six) hours as needed for  wheezing or shortness of breath. 3 Inhaler 3  . albuterol (PROVENTIL) (2.5 MG/3ML) 0.083% nebulizer solution Take 3 mLs (2.5 mg total) by nebulization every 6 (six) hours as needed for wheezing or shortness of breath. 360 mL 11  . amLODipine (NORVASC) 5 MG tablet Take 1 tablet (5 mg total) by mouth daily. 90 tablet 3  . atenolol (TENORMIN) 50 MG tablet Take 1 tablet (50 mg total) by mouth daily. 90 tablet 3  . atorvastatin (LIPITOR) 80 MG tablet Take 1 tablet (80 mg total) by mouth every evening. 90 tablet 0  . benazepril (LOTENSIN) 20 MG tablet Take 1 tablet (20 mg total) by mouth daily. 90 tablet 0  . budesonide-formoterol (SYMBICORT) 160-4.5 MCG/ACT inhaler Inhale 2 puffs 2 (two) times daily into the lungs. 3 Inhaler 1  . Misc. Devices (PULSE OXIMETER) MISC 1 application by Does not apply route as needed. 1 each 0  . SYNTHROID 125 MCG tablet Take 1 tablet (125 mcg total) by mouth daily before breakfast. 90 tablet 0  . Tiotropium Bromide Monohydrate (SPIRIVA RESPIMAT) 2.5 MCG/ACT AERS Inhale 2 puffs into the lungs daily. 3 Inhaler 3  . amoxicillin-clavulanate (AUGMENTIN) 875-125 MG tablet Take 1 tablet by mouth 2 (two) times daily for 7 days. 14 tablet 0  . ipratropium-albuterol (DUONEB) 0.5-2.5 (3) MG/3ML SOLN Take 3 mLs by nebulization every 6 (six) hours as needed (wheezing, shortness of breath). 360 mL 1  . predniSONE (DELTASONE) 50  MG tablet Take 1 tablet (50 mg total) by mouth daily with breakfast. 5 tablet 0   No current facility-administered medications for this visit.     Objective: BP 132/68 (BP Location: Left Arm, Patient Position: Sitting, Cuff Size: Large)   Pulse 68   Temp (!) 97.5 F (36.4 C) (Oral)   Ht 5\' 9"  (1.753 m)   Wt 142 lb 6.4 oz (64.6 kg)   SpO2 (!) 83% Comment: Pt placed on 2LPM via [place], up to 93-94%  BMI 21.03 kg/m  Gen: NAD, sits forward on table with hands on knees at times- much relieved posture after duoneb CV: RRR no murmurs rubs or gallops Lungs: some  scattered wheeze and poor air movement before duoneb- improved afterwards. Some rhonchi both before and after Abdomen: soft/nontender/nondistended/normal bowel sounds. thin  Ext: no edema Skin: warm, dry  Assessment/Plan:  COPD, severe (HCC) exacerbation  S: Patient with known sever COPD. At baseline he is on spiriva and symbicort. He was seen in Saturday clinic and was placed on 10 days of doxycycline and prednisone  He has chronic respiratory failure and is on 2L oxygen at home- we had to place this on him today with sats in mid 80s.   He has had increased shortness of breath, now coughing up green sputum which is atypical for him. The shortness of breath is somewhat better. He finished the 5 days of 40mg  prednisone. Started after painting dining room on Friday.   Cough seems to have worsened over the weekend. No fever, body aches. Had some night sweats and chills on Saturday and then 1 more day since then. storngly denies feverHas not been around anyone with the flu. Wife recently treated with pneumonia. Tends to feel panicky when breathing gets worse. Afraid to close his eyes due to feeling like he may die. Feels better today. Having a hard time with his spiriva.  A/P: COPD exacerbation - flu test negative.  - stop doxycycline, start augmentin for 7 days (he has tolerated this before even with keflex allergy). Changing due to only mild improvement in symptoms in addition to fact doxycycline had been used within 3 months so has increased risk of resistance). His allergies limit options - add an additional 5 days of prednisone to regimen - patient had improvement with duoneb , we planned to consider chest x-ray if negative - duoneb at home until he feels he can sufficiently take the spiriva (ipratropium is basically short acting spiriva) - has follow up with Dr. Lake Bells next week - discussed hospital option if fails to improve - Also discussed using fan in front of his face to help with  anxiety   Future Appointments  Date Time Provider [place]  01/17/2018  2:30 PM Simonne Maffucci B, MD [facility] None  01/25/2018 10:00 AM [facility] 3 [facility]  01/25/2018 11:00 AM [facility] 3 [facility]  01/25/2018 11:30 AM Conrad Stockbridge, MD [facility]  02/06/2018  9:00 AM Juanito Doom, MD [facility] None   Lab/Order associations: COPD exacerbation ([place]) - Plan: ipratropium-albuterol (DUONEB) 0.5-2.5 (3) MG/3ML nebulizer solution 3 mL, DISCONTINUED: ipratropium-albuterol (DUONEB) 0.5-2.5 (3) MG/3ML nebulizer solution 3 mL  Chills - Plan: POC Influenza A&B(BINAX/QUICKVUE)  Meds ordered this encounter  Medications  . amoxicillin-clavulanate (AUGMENTIN) 875-125 MG tablet    Sig: Take 1 tablet by mouth 2 (two) times daily for 7 days.    Dispense:  14 tablet    Refill:  0  . predniSONE (DELTASONE)  50 MG tablet    Sig: Take 1 tablet (50 mg total) by mouth daily with breakfast.    Dispense:  5 tablet    Refill:  0  . DISCONTD: ipratropium-albuterol (DUONEB) 0.5-2.5 (3) MG/3ML nebulizer solution 3 mL  . ipratropium-albuterol (DUONEB) 0.5-2.5 (3) MG/3ML nebulizer solution 3 mL  . ipratropium-albuterol (DUONEB) 0.5-2.5 (3) MG/3ML SOLN    Sig: Take 3 mLs by nebulization every 6 (six) hours as needed (wheezing, shortness of breath).    Dispense:  360 mL    Refill:  1   Time Stamp The duration of face-to-face time during this visit was greater than 40 minutes (1:50-3:05 PM with 20 minutes outside of room caring for different patient while he was getting nebulizer treatment) . Greater than 50% of this time was spent in counseling, explanation of diagnosis, planning of further management, and/or coordination of care including discussion of treatment plan, his anxiety and potential ways to address this, follow up needs, ED precautions.   Return precautions advised.  Garret Reddish, MD

## 2018-01-12 NOTE — Patient Instructions (Addendum)
COPD  Flare up - flu test negative.  - stop doxycycline, start augmentin for 7 days (he has tolerated this before even with keflex allergy). Changing due to only mild improvement in symptoms in addition to fact doxycycline had been used within 3 months so has increased risk of resistance). His allergies limit options.  - add an additional 5 days of prednisone to regimen - patient had improvement with duoneb , we planned to consider chest x-ray if negative - has follow up with Dr. Lake Bells next week - duoneb until he feels he can sufficiently take the spiriva (ipratropium is basically short acting spiriva), then can go back to albuterol only  Also discussed using fan in front of his face to help with anxiety

## 2018-01-12 NOTE — Assessment & Plan Note (Signed)
S: Patient with known sever COPD. At baseline he is on spiriva and symbicort. He was seen in Saturday clinic and was placed on 10 days of doxycycline and prednisone  He has chronic respiratory failure and is on 2L oxygen at home- we had to place this on him today with sats in mid 80s.   He has had increased shortness of breath, now coughing up green sputum which is atypical for him. The shortness of breath is somewhat better. He finished the 5 days of 40mg  prednisone. Started after painting dining room on Friday.   Cough seems to have worsened over the weekend. No fever, body aches. Had some night sweats and chills on Saturday and then 1 more day since then. storngly denies feverHas not been around anyone with the flu. Wife recently treated with pneumonia. Tends to feel panicky when breathing gets worse. Afraid to close his eyes due to feeling like he may die. Feels better today. Having a hard time with his spiriva.  A/P: COPD exacerbation - flu test negative.  - stop doxycycline, start augmentin for 7 days (he has tolerated this before even with keflex allergy). Changing due to only mild improvement in symptoms in addition to fact doxycycline had been used within 3 months so has increased risk of resistance). His allergies limit options - add an additional 5 days of prednisone to regimen - patient had improvement with duoneb , we planned to consider chest x-ray if negative - duoneb at home until he feels he can sufficiently take the spiriva (ipratropium is basically short acting spiriva) - has follow up with Dr. Lake Bells next week  - Also discussed using fan in front of his face to help with anxiety

## 2018-01-13 LAB — FUNGUS CULTURE W SMEAR
MICRO NUMBER: 90108962
SMEAR: NONE SEEN
SPECIMEN QUALITY:: ADEQUATE

## 2018-01-14 ENCOUNTER — Other Ambulatory Visit: Payer: Self-pay | Admitting: Family Medicine

## 2018-01-17 ENCOUNTER — Encounter: Payer: Self-pay | Admitting: Pulmonary Disease

## 2018-01-17 ENCOUNTER — Ambulatory Visit: Payer: Medicare Other | Admitting: Pulmonary Disease

## 2018-01-17 VITALS — BP 132/68 | HR 65 | Ht 69.0 in | Wt 144.0 lb

## 2018-01-17 DIAGNOSIS — J45909 Unspecified asthma, uncomplicated: Secondary | ICD-10-CM | POA: Diagnosis not present

## 2018-01-17 DIAGNOSIS — J301 Allergic rhinitis due to pollen: Secondary | ICD-10-CM

## 2018-01-17 DIAGNOSIS — J449 Chronic obstructive pulmonary disease, unspecified: Secondary | ICD-10-CM | POA: Diagnosis not present

## 2018-01-17 MED ORDER — PREDNISONE 10 MG PO TABS: 10.0000 mg | ORAL_TABLET | Freq: Every day | ORAL | 0 refills | Status: DC

## 2018-01-17 NOTE — Progress Notes (Signed)
Subjective:    Patient ID: Brian Graham, male    DOB: 1951/07/05, 67 y.o.   MRN: [id number]  Synopsis: First referred in 2017 for severe COPD after being followed by the pulmonary group in [place] before moving to [place]. Smoked 40 years, quit in 2012   HPI Chief Complaint  Patient presents with  . Follow-up    pt states he was doing well X2 weeks, now c/o sob, prod cough with green mucus X1 week again. Pt treated by PCP with abx and prednisone, states he is now feeling better but s/s not resolved.    Brian Graham has really struggled since last fall when he had a whole home air purifier system installed in his house.  Apparently he has a UV light system that is supposed to help clear up infections in his lung.  He tells me that after I saw him last he took the prednisone and he felt better for a few days.  However he felt worse a week later and was prescribed prednisone and an antibiotic.  He says that he was prescribed augmentin and prednisone which helped.  He is currently taking that antibiotic.  He says that he put down some lawn chemicals a few weeks back before this started.    Past Medical History:  Diagnosis Date  . Anxiety   . Arthritis    "left knee" (08/05/2016)  . Bruises easily   . CAD (coronary artery disease) of artery bypass graft 08/05/2016   Around age 1. CABG - 3 vessel.   . Chronic kidney disease   . Colon polyp   . COPD, severe ([place]) 08/23/2016   Alpha 1 studies normal per prior pulmonary group notes Smoked 40 years, quit in 2012 June 2016 PFT from prior pulmonary group: "significant obstruction" FEV1 1.58L (47% pred), Residual volume 171% pred, DLCO 59% pred Simple Spirometry>> 09/15/2016 ratio 42% FEV1 1.02 L / 31%   . Essential tremor 06/11/2016   Plans to see Dr. Carles Collet  . Former smoker 09/01/2016   Quit 2012. 40 pack years at least.   . GERD (gastroesophageal reflux disease)   . Headache   . History of blood transfusion 08/1997   "when he had his  heart surgery"  . History of shingles 1970-2013 X 3  . HTN (hypertension) 08/05/2016   Amlodipine 5mg , atenolol 50mg  BID, benazepril 20mg   . Hyperlipemia   . Hypothyroidism   . Lumbar disc disease   . Myocardial infarction ([place]) 08/1997  . Peripheral vascular disease ([place])   . PONV (postoperative nausea and vomiting)   . Urinary tract infection 09/03/2017  . Wears glasses       Review of Systems  Constitutional: Negative for chills, fatigue and fever.  HENT: Negative for nosebleeds, postnasal drip and rhinorrhea.   Respiratory: Positive for cough, shortness of breath and wheezing.   Cardiovascular: Negative for chest pain, palpitations and leg swelling.       Objective:   Physical Exam  Vitals:   01/17/18 1413  BP: 132/68  Pulse: 65  SpO2: 94%  Weight: 144 lb (65.3 kg)  Height: 5\' 9"  (1.753 m)    Gen: well appearing today HENT: OP clear, TM's clear, neck supple PULM: CTA B, normal percussion CV: RRR, no mgr, trace edema GI: BS+, soft, nontender Derm: no cyanosis or rash Psyche: normal mood and affect     CBC    Component Value Date/Time   WBC 9.0 12/15/2017 0927   RBC 4.52 12/15/2017 0927  HGB 13.7 12/15/2017 0927   HCT 41.4 12/15/2017 0927   PLT 262.0 12/15/2017 0927   MCV 91.5 12/15/2017 0927   MCH 30.3 10/05/2017 0930   MCHC 33.2 12/15/2017 0927   RDW 15.9 (H) 12/15/2017 0927   LYMPHSABS 1.2 12/15/2017 0927   MONOABS 0.8 12/15/2017 0927   EOSABS 0.3 12/15/2017 0927   BASOSABS 0.1 12/15/2017 0927   Labss: Alpha 1 studies normal per prior pulmonary group notes 2014 M-M January 2019 serum IgE elevated at 147  Chest imaging: Images from May 2018 CT chest independently reviewed showing moderate to severe emphysema, airway thickening but no mass or lesion August 2018 chest x-ray images independently reviewed showing emphysema, no mass, no infiltrate  PFT: June 2016 PFT from prior pulmonary group: "significant obstruction" FEV1 1.58L (47% pred),  Residual volume 171% pred, DLCO 59% pred Simple Spirometry>> 09/15/2016 ratio 42% FEV1 1.02 L / 31%  Microbiology: January 2019 sputum culture AFB negative, fungus negative, bacteria negative     Assessment & Plan:   Asthma, unspecified asthma severity, unspecified whether complicated, unspecified whether persistent - Plan: Ambulatory referral to Allergy  COPD with asthma ([place])  Allergic rhinitis due to pollen, unspecified seasonality  Brian Graham has really struggled recently with recurrent exacerbations of COPD.  Back in January we saw him and collected mucus for atypical and fungal and bacterial organisms and this workup was negative.  However, his blood work at that time was suggestive of an underlying allergy because his serum IgE was elevated and his serum eosinophil count was slightly elevated as well.  In individuals who have recurrent exacerbations of COPD we do worry about allergy, sometimes an ABPA-like syndrome.  Given the fact that he recently had a new HVAC auxiliary system installed in his home (UV light cleaning device) and has been sick ever since then I believe there is a direct correlation.  Plan: COPD with asthma, severe with recurrent exacerbations: Will prescribe another 14 days of prednisone, I have instructed him to stop this 7 days prior to seeing the allergist Allergy referral Continue Symbicort and Spiriva Continue albuterol as needed If no immunotherapy available, then consider Xolair If can't get Xolair then consider low dose long term prednisone (least favorable option)  Hypertension: Continue benazepril, this is not causing his symptoms  Allergic rhinitis: Take over-the-counter Zyrtec, Flonase  We will see you back in 6-8 weeks or sooner if needed   Current Outpatient Medications:  .  acetaminophen (TYLENOL) 500 MG tablet, Take 1,000 mg by mouth every 6 (six) hours as needed for mild pain., Disp: , Rfl:  .  albuterol (PROVENTIL HFA;VENTOLIN HFA) 108 (90  Base) MCG/ACT inhaler, Inhale 2 puffs into the lungs every 6 (six) hours as needed for wheezing or shortness of breath., Disp: 3 Inhaler, Rfl: 3 .  albuterol (PROVENTIL) (2.5 MG/3ML) 0.083% nebulizer solution, Take 3 mLs (2.5 mg total) by nebulization every 6 (six) hours as needed for wheezing or shortness of breath., Disp: 360 mL, Rfl: 11 .  amLODipine (NORVASC) 5 MG tablet, Take 1 tablet (5 mg total) by mouth daily., Disp: 90 tablet, Rfl: 3 .  amoxicillin-clavulanate (AUGMENTIN) 875-125 MG tablet, Take 1 tablet by mouth 2 (two) times daily for 7 days., Disp: 14 tablet, Rfl: 0 .  atenolol (TENORMIN) 50 MG tablet, Take 1 tablet (50 mg total) by mouth daily., Disp: 90 tablet, Rfl: 3 .  atorvastatin (LIPITOR) 80 MG tablet, Take 1 tablet (80 mg total) by mouth every evening., Disp: 90 tablet, Rfl: 0 .  benazepril (LOTENSIN) 20 MG tablet, Take 1 tablet (20 mg total) by mouth daily., Disp: 90 tablet, Rfl: 0 .  budesonide-formoterol (SYMBICORT) 160-4.5 MCG/ACT inhaler, Inhale 2 puffs 2 (two) times daily into the lungs., Disp: 3 Inhaler, Rfl: 1 .  ipratropium-albuterol (DUONEB) 0.5-2.5 (3) MG/3ML SOLN, Take 3 mLs by nebulization every 6 (six) hours as needed (wheezing, shortness of breath)., Disp: 360 mL, Rfl: 1 .  Misc. Devices (PULSE OXIMETER) MISC, 1 application by Does not apply route as needed., Disp: 1 each, Rfl: 0 .  SYNTHROID 125 MCG tablet, Take 1 tablet (125 mcg total) by mouth daily before breakfast., Disp: 90 tablet, Rfl: 0 .  Tiotropium Bromide Monohydrate (SPIRIVA RESPIMAT) 2.5 MCG/ACT AERS, Inhale 2 puffs into the lungs daily., Disp: 3 Inhaler, Rfl: 3 .  predniSONE (DELTASONE) 10 MG tablet, Take 1 tablet (10 mg total) by mouth daily with breakfast., Disp: 14 tablet, Rfl: 0

## 2018-01-17 NOTE — Patient Instructions (Addendum)
COPD with asthma, severe with recurrent exacerbations: Will prescribe another 14 days of prednisone, I have instructed him to stop this 7 days prior to seeing the allergist Allergy referral Continue Symbicort and Spiriva Continue albuterol as needed If no immunotherapy available, then consider Xolair If can't get Xolair then consider low dose long term prednisone (least favorable option)  Hypertension: Continue benazepril, this is not causing your symptoms  Allergic rhinitis: Take over-the-counter Zyrtec, Flonase  We will see you back in 6-8 weeks or sooner if needed

## 2018-01-20 ENCOUNTER — Encounter (HOSPITAL_COMMUNITY): Payer: Medicare Other

## 2018-01-20 ENCOUNTER — Ambulatory Visit: Payer: Medicare Other | Admitting: Vascular Surgery

## 2018-01-23 NOTE — Progress Notes (Signed)
Established Intermittent Claudication   History of Present Illness   Brian Graham is a 67 y.o. (07-28-1951) male who presents with chief complaint: no leg sx.      Prior procedure include: 1.  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  The pt has subsequently undergone L radical nephrectomy after his L CIA stenting.  He has recovered from that surgery and has had no more recurrent kidney infections.    Previously his L hip pain remains resolved.  The patient's treatment regimen currently included: maximal medical management.  The patient's PMH, PSH, and SH, and FamHx are unchanged from 09/30/17.  Current Outpatient Medications  Medication Sig Dispense Refill  . acetaminophen (TYLENOL) 500 MG tablet Take 1,000 mg by mouth every 6 (six) hours as needed for mild pain.    Marland Kitchen albuterol (PROVENTIL HFA;VENTOLIN HFA) 108 (90 Base) MCG/ACT inhaler Inhale 2 puffs into the lungs every 6 (six) hours as needed for wheezing or shortness of breath. 3 Inhaler 3  . albuterol (PROVENTIL) (2.5 MG/3ML) 0.083% nebulizer solution Take 3 mLs (2.5 mg total) by nebulization every 6 (six) hours as needed for wheezing or shortness of breath. 360 mL 11  . amLODipine (NORVASC) 5 MG tablet Take 1 tablet (5 mg total) by mouth daily. 90 tablet 3  . atenolol (TENORMIN) 50 MG tablet Take 1 tablet (50 mg total) by mouth daily. 90 tablet 3  . atorvastatin (LIPITOR) 80 MG tablet Take 1 tablet (80 mg total) by mouth every evening. 90 tablet 0  . benazepril (LOTENSIN) 20 MG tablet Take 1 tablet (20 mg total) by mouth daily. 90 tablet 0  . budesonide-formoterol (SYMBICORT) 160-4.5 MCG/ACT inhaler Inhale 2 puffs 2 (two) times daily into the lungs. 3 Inhaler 1  . ipratropium-albuterol (DUONEB) 0.5-2.5 (3) MG/3ML SOLN Take 3 mLs by nebulization every 6 (six) hours as needed (wheezing, shortness of breath). 360 mL 1  . Misc.  Devices (PULSE OXIMETER) MISC 1 application by Does not apply route as needed. 1 each 0  . ondansetron (ZOFRAN-ODT) 4 MG disintegrating tablet PLACE 1 TO 2 TABLETS ON TONGUE AND ALLOW TO DISSOLVE EVERY 8 HOURS AS NEEDED FOR NAUSEA / VOMITING 30 tablet 0  . predniSONE (DELTASONE) 10 MG tablet Take 1 tablet (10 mg total) by mouth daily with breakfast. 14 tablet 0  . SYNTHROID 125 MCG tablet Take 1 tablet (125 mcg total) by mouth daily before breakfast. 90 tablet 0  . Tiotropium Bromide Monohydrate (SPIRIVA RESPIMAT) 2.5 MCG/ACT AERS Inhale 2 puffs into the lungs daily. 3 Inhaler 3   No current facility-administered medications for this visit.     On ROS today: no hip pain, no kidney sx.   Physical Examination   Vitals:   01/25/18 1104 01/25/18 1108  BP: (!) 147/84 (!) 145/84  Pulse: (!) 56 (!) 56  Resp: 18   Temp: 97.7 F (36.5 C)   TempSrc: Oral   SpO2: 95%   Weight: 140 lb (63.5 kg)   Height: 5\' 9"  (1.753 m)    Body mass index is 20.67 kg/m.  General Alert, O x 3, WD, NAD  Pulmonary Sym exp, good B air movt, CTA B  Cardiac RRR, Nl S1, S2, no Murmurs, No rubs, No S3,S4  Vascular Vessel Right Left  Radial Palpable Palpable  Brachial Palpable Palpable  Carotid Palpable, No Bruit Palpable, No Bruit  Aorta Not  palpable N/A  Femoral Palpable Palpable  Popliteal Not palpable Not palpable  PT Palpable Palpable  DP Palpable Palpable    Gastro- intestinal soft, non-distended, non-tender to palpation, No guarding or rebound, no HSM, no masses, no CVAT B, No palpable prominent aortic pulse,    Musculo- skeletal M/S 5/5 throughout  , Extremities without ischemic changes  , No edema present, No visible varicosities , No Lipodermatosclerosis present  Neurologic Pain and light touch intact in extremities , Motor exam as listed above    Non-Invasive Vascular imaging   ABI (01/25/2018 )  R:   ABI: 1.11 (1.11),   PT: tri  DP: tri  TBI:  0.75  L:   ABI: 1.09 (1.07),   PT:  tri  DP: tri  TBI: 0.69  Aortoiliac Duplex (01/25/2018) Widely patent L CIA stent   Medical Decision Making   Brian Graham is a 67 y.o. male who presents with:  s/p L CIA PTA+S for L CIA occlusion, s/p L nephrectomy for L pelvic kidney    Based on the patient's vascular studies and examination, I have offered the patient: annual BLE ABI and Aortoiliac duplex.  I discussed in depth with the patient the nature of atherosclerosis, and emphasized the importance of maximal medical management including strict control of blood pressure, blood glucose, and lipid levels, antiplatelet agents, obtaining regular exercise, and cessation of smoking.    The patient is aware that without maximal medical management the underlying atherosclerotic disease process will progress, limiting the benefit of any interventions. The patient is currently on a statin: Lipitor.  The patient is currently on an anti-platelet: ASA.  Thank you for allowing [place] to participate in this patient's care.   Adele Barthel, MD, FACS [facility] Office: [phone] Pager: [phone]

## 2018-01-24 ENCOUNTER — Ambulatory Visit: Payer: Medicare Other | Admitting: Allergy and Immunology

## 2018-01-24 ENCOUNTER — Encounter: Payer: Self-pay | Admitting: Allergy and Immunology

## 2018-01-24 VITALS — BP 132/60 | HR 64 | Temp 98.2°F | Resp 20 | Ht 66.2 in | Wt 140.6 lb

## 2018-01-24 DIAGNOSIS — J3089 Other allergic rhinitis: Secondary | ICD-10-CM | POA: Diagnosis not present

## 2018-01-24 DIAGNOSIS — J449 Chronic obstructive pulmonary disease, unspecified: Secondary | ICD-10-CM | POA: Diagnosis not present

## 2018-01-24 MED ORDER — AZELASTINE HCL 0.1 % NA SOLN: 2.0000 | Freq: Two times a day (BID) | NASAL | 5 refills | Status: DC

## 2018-01-24 NOTE — Assessment & Plan Note (Signed)
Aeroallergen skin tests positive only to dust mite antigen.  Aeroallergen avoidance measures have been discussed and provided in written form.  A prescription has been provided for azelastine nasal spray, 1-2 sprays per nostril 2 times daily as needed. Proper nasal spray technique has been discussed and demonstrated.   Nasal saline spray (i.e., Simply Saline) or nasal saline lavage (i.e., NeilMed) is recommended as needed and prior to medicated nasal sprays.

## 2018-01-24 NOTE — Assessment & Plan Note (Signed)
   Continue Symbicort 160-4.5 g, 2 inhalations twice daily, Spiriva Respimat 2.5 g, 2 inhalations daily, and albuterol every 6 hours if needed.  To maximize pulmonary deposition, a spacer has been provided along with instructions for its proper administration with an HFA inhaler.

## 2018-01-24 NOTE — Progress Notes (Signed)
New Patient Note  RE: Brian Graham MRN: [id number] DOB: Jan 29, 1951 Date of Office Visit: 01/24/2018  Referring provider: Juanito Doom, MD Primary care provider: [name], MD  Chief Complaint: Breathing Problem; Sinus Problem; and Allergy Testing   History of present illness: Brian Graham is a 67 y.o. male seen today in consultation requested by Simonne Maffucci, MD.  He reports that in November 2018 he installed a UV system into the duct work of his home (UltraMax EZ-UV).  He reports that since that time he has had 5 bouts of bronchitis and his wife has had 3 bouts of bronchitis.  In addition, he has been experiencing rhinorrhea, thick postnasal drainage, throat clearing, and occasional sinus pressure.  The device was disabled approximately 10 days ago when he believes he has noticed some reduction in symptoms.  He was diagnosed with COPD approximately 3 years ago.  He smoked a pack to pack and 1/2/day for 40 years and quit 5 years ago.  He currently takes Symbicort 160-4.5 g, 2 inhalations twice daily, and Spiriva 2.5 g, 2 inhalations once daily.   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  Aeroallergen avoidance measures have been discussed and provided in written form.  A prescription has been provided for azelastine nasal spray, 1-2 sprays per nostril 2 times daily as needed. Proper nasal spray technique has been discussed and demonstrated.   Nasal saline spray (i.e., Simply Saline) or nasal saline lavage (i.e., NeilMed) is recommended as needed and prior to medicated nasal sprays.  COPD, severe ([place])  Continue Symbicort 160-4.5 g, 2 inhalations twice daily, Spiriva Respimat 2.5 g, 2 inhalations daily, and albuterol every 6 hours if needed.  To maximize pulmonary deposition, a spacer has been provided along with instructions for its proper administration with an HFA  inhaler.   Meds ordered this encounter  Medications  . azelastine (ASTELIN) 0.1 % nasal spray    Sig: Place 2 sprays into both nostrils 2 (two) times daily. Use in each nostril as directed    Dispense:  30 mL    Refill:  5    Diagnostics: Spirometry: FVC was 3.28 L (84% predicted) and FEV1 was 1.14 L (39% predicted) with 170 mL postbronchodilator improvement.  Please see scanned spirometry results for details. Epicutaneous testing: Positive to dust mite antigen. Intradermal testing: Negative.    Physical examination: Blood pressure 132/60, pulse 64, temperature 98.2 F (36.8 C), temperature source Oral, resp. rate 20, height 5' 6.2" (1.681 m), weight 140 lb 9.6 oz (63.8 kg), SpO2 93 %.  General: Alert, interactive, in no acute distress. HEENT: TMs pearly gray, turbinates mildly edematous without discharge, post-pharynx moderately erythematous. Neck: Supple without lymphadenopathy. Lungs: Decreased breath sounds bilaterally without wheezing, rhonchi or rales. CV: Normal S1, S2 without murmurs. Abdomen: Nondistended, nontender. Skin: Warm and dry, without lesions or rashes. Extremities:  No clubbing, cyanosis or edema. Neuro:   Grossly intact.  Review of systems:  Review of systems negative except as noted in HPI / PMHx or noted below: Review of Systems  Constitutional: Negative.   HENT: Negative.   Eyes: Negative.   Respiratory: Negative.   Cardiovascular: Negative.   Gastrointestinal: Negative.   Genitourinary: Negative.   Musculoskeletal: Negative.   Skin: Negative.   Neurological: Negative.   Endo/Heme/Allergies: Negative.   Psychiatric/Behavioral: Negative.     Past medical history:  Past Medical History:  Diagnosis Date  . Anxiety   .  Arthritis    "left knee" (08/05/2016)  . Bruises easily   . CAD (coronary artery disease) of artery bypass graft 08/05/2016   Around age 24. CABG - 3 vessel.   . Chronic kidney disease   . Colon polyp   . COPD, severe ([place])  08/23/2016   Alpha 1 studies normal per prior pulmonary group notes Smoked 40 years, quit in 2012 June 2016 PFT from prior pulmonary group: "significant obstruction" FEV1 1.58L (47% pred), Residual volume 171% pred, DLCO 59% pred Simple Spirometry>> 09/15/2016 ratio 42% FEV1 1.02 L / 31%   . Essential tremor 06/11/2016   Plans to see Dr. Carles Collet  . Former smoker 09/01/2016   Quit 2012. 40 pack years at least.   . GERD (gastroesophageal reflux disease)   . Headache   . History of blood transfusion 08/1997   "when he had his heart surgery"  . History of shingles 1970-2013 X 3  . HTN (hypertension) 08/05/2016   Amlodipine 5mg , atenolol 50mg  BID, benazepril 20mg   . Hyperlipemia   . Hypothyroidism   . Lumbar disc disease   . Myocardial infarction ([place]) 08/1997  . Peripheral vascular disease ([place])   . PONV (postoperative nausea and vomiting)   . Urinary tract infection 09/03/2017  . Wears glasses     Past surgical history:  Past Surgical History:  Procedure Laterality Date  . AORTOGRAM Right 08/31/2017   Procedure: AORTOGRAM BILATERAL PELVIC ANGIOGRAM WITH LEFT ILIAC ARTERY STENT;  Surgeon: Conrad [place], MD;  Location: [facility];  Service: Vascular;  Laterality: Right;  . BACK SURGERY    . CARDIAC CATHETERIZATION  08/1997  . CORONARY ARTERY BYPASS GRAFT  09/12/1997   "triple"  . INGUINAL HERNIA REPAIR Right 1961  . KNEE RECONSTRUCTION Left 1960s - 1974 X 4  . LAPAROSCOPIC ABLATION RENAL MASS  ~ 2014   "large abscess/pus ball"  . [place]   "they were wedged in the bowel"  . PATELLA FRACTURE SURGERY Left 1963  . PATELLA FRACTURE SURGERY Left ~ 1965   "removed knee cap"  . ROBOT ASSISTED LAPAROSCOPIC NEPHRECTOMY Left 10/07/2017   Procedure: XI ROBOTIC ASSISTED LAPAROSCOPIC NEPHRECTOMY OF PELVIC KIDNEY;  Surgeon: Alexis Frock, MD;  Location: [facility];  Service: Urology;  Laterality: Left;  Place robot patient tower at foot of bed like prostate per Dr. Tresa Moore. Pull extra  staple loads.  . TONSILLECTOMY      Family history: Family History  Problem Relation Age of Onset  . Alcohol abuse Mother   . Alcohol abuse Father        not involved in life  . Alcoholism Sister   . Healthy Sister   . Healthy Sister   . Allergic rhinitis Neg Hx   . Angioedema Neg Hx   . Asthma Neg Hx   . Eczema Neg Hx   . Immunodeficiency Neg Hx   . Urticaria Neg Hx     Social history: Social History   Socioeconomic History  . Marital status: Married    Spouse name: Not on file  . Number of children: Not on file  . Years of education: Not on file  . Highest education level: Not on file  Social Needs  . Financial resource strain: Not on file  . Food insecurity - worry: Not on file  . Food insecurity - inability: Not on file  . Transportation needs - medical: Not on file  . Transportation needs - non-medical: Not on file  Occupational History  .  Not on file  Tobacco Use  . Smoking status: Former Smoker    Packs/day: 1.00    Years: 45.00    Pack years: 45.00    Types: Cigarettes    Last attempt to quit: 07/10/2017    Years since quitting: 0.5  . Smokeless tobacco: Never Used  Substance and Sexual Activity  . Alcohol use: Yes    Alcohol/week: 8.4 oz    Types: 14 Shots of liquor per week    Comment: daily 2 shots of vodka  . Drug use: No  . Sexual activity: Not Currently  Other Topics Concern  . Not on file  Social History Narrative   Lives with wife. 2 adopted daughters from [place].       Retired from [employer] History: The patient lives in a 67-year-old house with carpeting the bedroom, gas heat, and central air.  There is a dog in the home which does not have access to his bedroom.  There is no known mold/water damage in the home.  He is a non-smoker.  Allergies as of 01/24/2018      Reactions   Keflex [cephalexin] Diarrhea, Nausea Only, Other (See Comments)   Insomnia   Sulfa Antibiotics Other (See Comments)   Insomnia   Ciprofloxacin  Diarrhea   Levaquin [levofloxacin] Other (See Comments)   insomnia   Omeprazole Diarrhea      Medication List        Accurate as of 01/24/18  5:09 PM. Always use your most recent med list.          acetaminophen 500 MG tablet Commonly known as:  TYLENOL Take 1,000 mg by mouth every 6 (six) hours as needed for mild pain.   albuterol (2.5 MG/3ML) 0.083% nebulizer solution Commonly known as:  PROVENTIL Take 3 mLs (2.5 mg total) by nebulization every 6 (six) hours as needed for wheezing or shortness of breath.   albuterol 108 (90 Base) MCG/ACT inhaler Commonly known as:  PROVENTIL HFA;VENTOLIN HFA Inhale 2 puffs into the lungs every 6 (six) hours as needed for wheezing or shortness of breath.   amLODipine 5 MG tablet Commonly known as:  NORVASC Take 1 tablet (5 mg total) by mouth daily.   atenolol 50 MG tablet Commonly known as:  TENORMIN Take 1 tablet (50 mg total) by mouth daily.   atorvastatin 80 MG tablet Commonly known as:  LIPITOR Take 1 tablet (80 mg total) by mouth every evening.   azelastine 0.1 % nasal spray Commonly known as:  ASTELIN Place 2 sprays into both nostrils 2 (two) times daily. Use in each nostril as directed   benazepril 20 MG tablet Commonly known as:  LOTENSIN Take 1 tablet (20 mg total) by mouth daily.   budesonide-formoterol 160-4.5 MCG/ACT inhaler Commonly known as:  SYMBICORT Inhale 2 puffs 2 (two) times daily into the lungs.   ondansetron 4 MG disintegrating tablet Commonly known as:  ZOFRAN-ODT PLACE 1 TO 2 TABLETS ON TONGUE AND ALLOW TO DISSOLVE EVERY 8 HOURS AS NEEDED FOR NAUSEA / VOMITING   Pulse Oximeter Misc 1 application by Does not apply route as needed.   SYNTHROID 125 MCG tablet Generic drug:  levothyroxine Take 1 tablet (125 mcg total) by mouth daily before breakfast.   Tiotropium Bromide Monohydrate 2.5 MCG/ACT Aers Commonly known as:  SPIRIVA RESPIMAT Inhale 2 puffs into the lungs daily.       Known medication  allergies: Allergies  Allergen Reactions  . Keflex [Cephalexin] Diarrhea, Nausea  Only and Other (See Comments)    Insomnia  . Sulfa Antibiotics Other (See Comments)    Insomnia  . Ciprofloxacin Diarrhea  . Levaquin [Levofloxacin] Other (See Comments)    insomnia  . Omeprazole Diarrhea    I appreciate the opportunity to take part in [place] care. Please do not hesitate to contact me with questions.  Sincerely,   R. Edgar Frisk, MD

## 2018-01-24 NOTE — Patient Instructions (Addendum)
Perennial allergic rhinitis Aeroallergen skin tests positive only to dust mite antigen.  Aeroallergen avoidance measures have been discussed and provided in written form.  A prescription has been provided for azelastine nasal spray, 1-2 sprays per nostril 2 times daily as needed. Proper nasal spray technique has been discussed and demonstrated.   Nasal saline spray (i.e., Simply Saline) or nasal saline lavage (i.e., NeilMed) is recommended as needed and prior to medicated nasal sprays.  COPD, severe ([place])  Continue Symbicort 160-4.5 g, 2 inhalations twice daily, Spiriva Respimat 2.5 g, 2 inhalations daily, and albuterol every 6 hours if needed.  To maximize pulmonary deposition, a spacer has been provided along with instructions for its proper administration with an HFA inhaler.   Return if symptoms worsen or fail to improve.  Control of House Dust Mite Allergen  House dust mites play a major role in allergic asthma and rhinitis.  They occur in environments with high humidity wherever human skin, the food for dust mites is found. High levels have been detected in dust obtained from mattresses, pillows, carpets, upholstered furniture, bed covers, clothes and soft toys.  The principal allergen of the house dust mite is found in its feces.  A gram of dust may contain 1,000 mites and 250,000 fecal particles.  Mite antigen is easily measured in the air during house cleaning activities.    1. Encase mattresses, including the box spring, and pillow, in an air tight cover.  Seal the zipper end of the encased mattresses with wide adhesive tape. 2. Wash the bedding in water of 130 degrees Farenheit weekly.  Avoid cotton comforters/quilts and flannel bedding: the most ideal bed covering is the dacron comforter. 3. Remove all upholstered furniture from the bedroom. 4. Remove carpets, carpet padding, rugs, and non-washable window drapes from the bedroom.  Wash drapes weekly or use plastic window  coverings. 5. Remove all non-washable stuffed toys from the bedroom.  Wash stuffed toys weekly. 6. Have the room cleaned frequently with a vacuum cleaner and a damp dust-mop.  The patient should not be in a room which is being cleaned and should wait 1 hour after cleaning before going into the room. 7. Close and seal all heating outlets in the bedroom.  Otherwise, the room will become filled with dust-laden air.  An electric heater can be used to heat the room. 8. Reduce indoor humidity to less than 50%.  Do not use a humidifier.

## 2018-01-25 ENCOUNTER — Other Ambulatory Visit: Payer: Self-pay

## 2018-01-25 ENCOUNTER — Ambulatory Visit (INDEPENDENT_AMBULATORY_CARE_PROVIDER_SITE_OTHER)
Admission: RE | Admit: 2018-01-25 | Discharge: 2018-01-25 | Disposition: A | Discharge status: Home / Self Care | Payer: Medicare Other | Source: Ambulatory Visit | Attending: Vascular Surgery | Admitting: Vascular Surgery

## 2018-01-25 ENCOUNTER — Ambulatory Visit (INDEPENDENT_AMBULATORY_CARE_PROVIDER_SITE_OTHER): Payer: Medicare Other | Admitting: Vascular Surgery

## 2018-01-25 ENCOUNTER — Encounter: Payer: Self-pay | Admitting: Vascular Surgery

## 2018-01-25 ENCOUNTER — Ambulatory Visit (HOSPITAL_COMMUNITY)
Admission: RE | Admit: 2018-01-25 | Discharge: 2018-01-25 | Disposition: A | Discharge status: Home / Self Care | Payer: Medicare Other | Source: Ambulatory Visit | Attending: Vascular Surgery | Admitting: Vascular Surgery

## 2018-01-25 VITALS — BP 145/84 | HR 56 | Temp 97.7°F | Resp 18 | Ht 69.0 in | Wt 140.0 lb

## 2018-01-25 DIAGNOSIS — I745 Embolism and thrombosis of iliac artery: Secondary | ICD-10-CM

## 2018-01-25 DIAGNOSIS — F17200 Nicotine dependence, unspecified, uncomplicated: Secondary | ICD-10-CM | POA: Insufficient documentation

## 2018-01-25 DIAGNOSIS — I2510 Atherosclerotic heart disease of native coronary artery without angina pectoris: Secondary | ICD-10-CM | POA: Diagnosis not present

## 2018-01-25 DIAGNOSIS — R0989 Other specified symptoms and signs involving the circulatory and respiratory systems: Secondary | ICD-10-CM | POA: Insufficient documentation

## 2018-01-25 DIAGNOSIS — R9389 Abnormal findings on diagnostic imaging of other specified body structures: Secondary | ICD-10-CM | POA: Insufficient documentation

## 2018-01-25 DIAGNOSIS — E785 Hyperlipidemia, unspecified: Secondary | ICD-10-CM | POA: Insufficient documentation

## 2018-01-26 NOTE — Telephone Encounter (Signed)
error 

## 2018-02-01 LAB — MYCOBACTERIA,CULT W/FLUOROCHROME SMEAR
MICRO NUMBER:: 90108955
SMEAR:: NONE SEEN
SPECIMEN QUALITY: ADEQUATE

## 2018-02-06 ENCOUNTER — Encounter: Payer: Self-pay | Admitting: Pulmonary Disease

## 2018-02-06 ENCOUNTER — Ambulatory Visit: Payer: Medicare Other | Admitting: Pulmonary Disease

## 2018-02-06 VITALS — BP 132/76 | HR 52 | Ht 69.0 in | Wt 145.0 lb

## 2018-02-06 DIAGNOSIS — J45909 Unspecified asthma, uncomplicated: Secondary | ICD-10-CM | POA: Diagnosis not present

## 2018-02-06 DIAGNOSIS — J449 Chronic obstructive pulmonary disease, unspecified: Secondary | ICD-10-CM

## 2018-02-06 DIAGNOSIS — R0600 Dyspnea, unspecified: Secondary | ICD-10-CM

## 2018-02-06 DIAGNOSIS — J301 Allergic rhinitis due to pollen: Secondary | ICD-10-CM | POA: Diagnosis not present

## 2018-02-06 MED ORDER — PREDNISONE 20 MG PO TABS: 20.0000 mg | ORAL_TABLET | Freq: Every day | ORAL | 0 refills | Status: DC

## 2018-02-06 NOTE — Progress Notes (Signed)
Subjective:    Patient ID: Brian Graham, male    DOB: 09/10/1951, 67 y.o.   MRN: [id number]  Synopsis: First referred in 2017 for severe COPD after being followed by the pulmonary group in [place] before moving to [place]. Smoked 40 years, quit in 2012   HPI Chief Complaint  Patient presents with  . Follow-up    pt c/o prod cough with green mucus Xfew wks.  Denies SOB, fatigue, fever.     Brian Graham was out in [place] and he was around a lot of smoke and he was exposed to someone who was sick on the plane.  He started coughing up green mucus about 4 days ago.  He has been having a little sinus congestion.  He brought up some mucus this morning, but he feels like it is getting better.  No trouble breathing, no trouble sleeping.  He saw the allergist and was started on flonase.  He was told that he had a dust mite allergy.  He has covered his mattress with plastic.    Past Medical History:  Diagnosis Date  . Anxiety   . Arthritis    "left knee" (08/05/2016)  . Bruises easily   . CAD (coronary artery disease) of artery bypass graft 08/05/2016   Around age 55. CABG - 3 vessel.   . Chronic kidney disease   . Colon polyp   . COPD, severe ([place]) 08/23/2016   Alpha 1 studies normal per prior pulmonary group notes Smoked 40 years, quit in 2012 June 2016 PFT from prior pulmonary group: "significant obstruction" FEV1 1.58L (47% pred), Residual volume 171% pred, DLCO 59% pred Simple Spirometry>> 09/15/2016 ratio 42% FEV1 1.02 L / 31%   . Essential tremor 06/11/2016   Plans to see Dr. Carles Collet  . Former smoker 09/01/2016   Quit 2012. 40 pack years at least.   . GERD (gastroesophageal reflux disease)   . Headache   . History of blood transfusion 08/1997   "when he had his heart surgery"  . History of shingles 1970-2013 X 3  . HTN (hypertension) 08/05/2016   Amlodipine 5mg , atenolol 50mg  BID, benazepril 20mg   . Hyperlipemia   . Hypothyroidism   . Lumbar disc disease   . Myocardial  infarction ([place]) 08/1997  . Peripheral vascular disease ([place])   . PONV (postoperative nausea and vomiting)   . Urinary tract infection 09/03/2017  . Wears glasses       Review of Systems  Constitutional: Negative for chills, fatigue and fever.  HENT: Negative for nosebleeds, postnasal drip and rhinorrhea.   Respiratory: Positive for cough, shortness of breath and wheezing.   Cardiovascular: Negative for chest pain, palpitations and leg swelling.       Objective:   Physical Exam  Vitals:   02/06/18 0852  BP: 132/76  Pulse: (!) 52  SpO2: 97%  Weight: 145 lb (65.8 kg)  Height: 5\' 9"  (1.753 m)    Gen: coughing, mildly ill HENT: OP clear, TM's clear, neck supple PULM: No wheezing today B, normal percussion CV: RRR, no mgr, trace edema GI: BS+, soft, nontender Derm: no cyanosis or rash Psyche: normal mood and affect      CBC    Component Value Date/Time   WBC 9.0 12/15/2017 0927   RBC 4.52 12/15/2017 0927   HGB 13.7 12/15/2017 0927   HCT 41.4 12/15/2017 0927   PLT 262.0 12/15/2017 0927   MCV 91.5 12/15/2017 0927   MCH 30.3 10/05/2017 0930   MCHC  33.2 12/15/2017 0927   RDW 15.9 (H) 12/15/2017 0927   LYMPHSABS 1.2 12/15/2017 0927   MONOABS 0.8 12/15/2017 0927   EOSABS 0.3 12/15/2017 0927   BASOSABS 0.1 12/15/2017 0927   Labss: Alpha 1 studies normal per prior pulmonary group notes 2014 M-M January 2019 serum IgE elevated at 147  Chest imaging: Images from May 2018 CT chest independently reviewed showing moderate to severe emphysema, airway thickening but no mass or lesion August 2018 chest x-ray images independently reviewed showing emphysema, no mass, no infiltrate  PFT: June 2016 PFT from prior pulmonary group: "significant obstruction" FEV1 1.58L (47% pred), Residual volume 171% pred, DLCO 59% pred Simple Spirometry>> 09/15/2016 ratio 42% FEV1 1.02 L / 31%  Microbiology: January 2019 sputum culture AFB negative, fungus negative, bacteria negative      Assessment & Plan:   Dyspnea, unspecified type - Plan: IgE  Asthma, unspecified asthma severity, unspecified whether complicated, unspecified whether persistent  COPD with asthma ([place])  Allergic rhinitis due to pollen, unspecified seasonality  Discussion: Unfortunately Brian Graham had another flareup of his COPD after he traveled out [place] recently.  It sounds like this started with a viral illness.  However, he also has a lot of allergic symptoms with a GI scratchy throat and now a proven dust mite allergy and a elevated serum IgE.  I believe that his COPD with asthma is due to recurrent allergy problems.  I like to see how he does with the interventions set in place by the allergist including a fluticasone nose spray, Zyrtec, and dust mite avoidance in the home.  When he comes back in 8 weeks I would repeat a serum IgE.  If it still elevated and he still has severe respiratory and chest symptoms then will start Xolair.  Plan: Allergic rhinitis: Continue Zyrtec and the nasal steroid prescribed by the allergist Continue dust mite avoidance as arranged by the allergist  COPD with asthma and recurrent exacerbations: Continue Symbicort with a spacer Continue Spiriva We will repeat a serum IgE when you come back to see [place] in 8 weeks, we may need to give you Xolair if it still elevated and you are still sick If your symptoms worsen over the next 1-2 days then take the prednisone prescription I gave you today  Hypertension: Continue benazepril  We will see you back in 8 weeks or sooner if needed   Current Outpatient Medications:  .  acetaminophen (TYLENOL) 500 MG tablet, Take 1,000 mg by mouth every 6 (six) hours as needed for mild pain., Disp: , Rfl:  .  albuterol (PROVENTIL HFA;VENTOLIN HFA) 108 (90 Base) MCG/ACT inhaler, Inhale 2 puffs into the lungs every 6 (six) hours as needed for wheezing or shortness of breath., Disp: 3 Inhaler, Rfl: 3 .  albuterol (PROVENTIL) (2.5 MG/3ML) 0.083%  nebulizer solution, Take 3 mLs (2.5 mg total) by nebulization every 6 (six) hours as needed for wheezing or shortness of breath., Disp: 360 mL, Rfl: 11 .  amLODipine (NORVASC) 5 MG tablet, Take 1 tablet (5 mg total) by mouth daily., Disp: 90 tablet, Rfl: 3 .  atenolol (TENORMIN) 50 MG tablet, Take 1 tablet (50 mg total) by mouth daily., Disp: 90 tablet, Rfl: 3 .  atorvastatin (LIPITOR) 80 MG tablet, Take 1 tablet (80 mg total) by mouth every evening., Disp: 90 tablet, Rfl: 0 .  azelastine (ASTELIN) 0.1 % nasal spray, Place 2 sprays into both nostrils 2 (two) times daily. Use in each nostril as directed, Disp: 30 mL, Rfl:  5 .  benazepril (LOTENSIN) 20 MG tablet, Take 1 tablet (20 mg total) by mouth daily., Disp: 90 tablet, Rfl: 0 .  budesonide-formoterol (SYMBICORT) 160-4.5 MCG/ACT inhaler, Inhale 2 puffs 2 (two) times daily into the lungs., Disp: 3 Inhaler, Rfl: 1 .  Misc. Devices (PULSE OXIMETER) MISC, 1 application by Does not apply route as needed., Disp: 1 each, Rfl: 0 .  ondansetron (ZOFRAN-ODT) 4 MG disintegrating tablet, PLACE 1 TO 2 TABLETS ON TONGUE AND ALLOW TO DISSOLVE EVERY 8 HOURS AS NEEDED FOR NAUSEA / VOMITING, Disp: 30 tablet, Rfl: 0 .  SYNTHROID 125 MCG tablet, Take 1 tablet (125 mcg total) by mouth daily before breakfast., Disp: 90 tablet, Rfl: 0 .  Tiotropium Bromide Monohydrate (SPIRIVA RESPIMAT) 2.5 MCG/ACT AERS, Inhale 2 puffs into the lungs daily., Disp: 3 Inhaler, Rfl: 3 .  predniSONE (DELTASONE) 20 MG tablet, Take 1 tablet (20 mg total) by mouth daily with breakfast., Disp: 5 tablet, Rfl: 0

## 2018-02-06 NOTE — Patient Instructions (Signed)
Allergic rhinitis: Continue Zyrtec and the nasal steroid prescribed by the allergist Continue dust mite avoidance as arranged by the allergist  COPD with asthma and recurrent exacerbations: Continue Symbicort with a spacer Continue Spiriva We will repeat a serum IgE when you come back to see [place] in 8 weeks, we may need to give you Xolair if it still elevated and you are still sick If your symptoms worsen over the next 1-2 days then take the prednisone prescription I gave you today  Hypertension: Continue benazepril  We will see you back in 8 weeks or sooner if needed

## 2018-02-12 ENCOUNTER — Other Ambulatory Visit: Payer: Self-pay | Admitting: Family Medicine

## 2018-02-19 ENCOUNTER — Other Ambulatory Visit: Payer: Self-pay | Admitting: Family Medicine

## 2018-02-20 ENCOUNTER — Encounter: Payer: Self-pay | Admitting: Family Medicine

## 2018-02-20 ENCOUNTER — Telehealth: Payer: Self-pay

## 2018-02-20 ENCOUNTER — Ambulatory Visit: Payer: Medicare Other | Admitting: Family Medicine

## 2018-02-20 VITALS — BP 138/82 | HR 62 | Temp 97.5°F | Ht 69.0 in | Wt 148.8 lb

## 2018-02-20 DIAGNOSIS — E039 Hypothyroidism, unspecified: Secondary | ICD-10-CM | POA: Diagnosis not present

## 2018-02-20 DIAGNOSIS — I10 Essential (primary) hypertension: Secondary | ICD-10-CM

## 2018-02-20 LAB — TSH: TSH: 3.11 u[IU]/mL (ref 0.35–4.50)

## 2018-02-20 MED ORDER — SYNTHROID 125 MCG PO TABS: 125.0000 ug | ORAL_TABLET | Freq: Every day | ORAL | 3 refills | Status: DC

## 2018-02-20 NOTE — Telephone Encounter (Signed)
Spoke to patient's wife and let her know his Thyroid looks great. Patient wife verbalized understanding.

## 2018-02-20 NOTE — Patient Instructions (Signed)
Please stop by lab before you go. I refilled thyroid medicine for a year  No changes today. Thrilled you are doing so well!

## 2018-02-20 NOTE — Assessment & Plan Note (Addendum)
S: has always been on synthroid.  Lab Results  Component Value Date   TSH 3.42 04/22/2017   On thyroid medication-synthroid 125 mcg A/P: update tsh. Offered change to levothyroxine- states he has never been on with repeat 6 week tsh- he declines

## 2018-02-20 NOTE — Progress Notes (Signed)
Subjective:  Brian Graham is a 67 y.o. year old very pleasant male patient who presents for/with See problem oriented charting ROS- no chest pain. Cough and shortness of breath improved. Some scrathy/itchy throat   Past Medical History-  Patient Active Problem List   Diagnosis Date Noted  . Atherosclerosis of native arteries of extremity with intermittent claudication ([place]) 07/06/2017    Priority: High  . Weight loss 04/23/2017    Priority: High  . Smoker 09/01/2016    Priority: High  . COPD, severe ([place]) 08/23/2016    Priority: High  . Hx of CABG 08/05/2016    Priority: High  . CAD (coronary artery disease) of artery bypass graft 08/05/2016    Priority: High  . History of adenomatous polyp of colon 04/22/2017    Priority: Medium  . Hyperlipidemia 04/04/2017    Priority: Medium  . Hypothyroidism (acquired) 04/04/2017    Priority: Medium  . Essential hypertension 08/05/2016    Priority: Medium  . Essential tremor 06/11/2016    Priority: Medium  . Aortic atherosclerosis ([place]) 04/13/2017    Priority: Low  . Seasonal allergies 04/04/2017    Priority: Low  . Myalgia 10/04/2016    Priority: Low  . Dysuria 08/23/2016    Priority: Low  . Chronic pain of both knees 08/23/2016    Priority: Low  . Tension headache 06/11/2016    Priority: Low  . Perennial allergic rhinitis 01/24/2018  . 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 Urinary tract infection 09/03/2017  . Constipation 09/03/2017  . Anxiety 07/24/2017  . Pyelonephritis 04/23/2017    Medications- reviewed and updated Current Outpatient Medications  Medication Sig Dispense Refill  . acetaminophen (TYLENOL) 500 MG tablet Take 1,000 mg by mouth every 6 (six) hours as needed for mild pain.    Marland Kitchen albuterol (PROVENTIL HFA;VENTOLIN HFA) 108 (90 Base) MCG/ACT inhaler Inhale 2 puffs into the lungs every 6 (six) hours as needed for wheezing or shortness of breath. 3 Inhaler 3  . albuterol  (PROVENTIL) (2.5 MG/3ML) 0.083% nebulizer solution Take 3 mLs (2.5 mg total) by nebulization every 6 (six) hours as needed for wheezing or shortness of breath. 360 mL 11  . amLODipine (NORVASC) 5 MG tablet Take 1 tablet (5 mg total) by mouth daily. 90 tablet 3  . atenolol (TENORMIN) 50 MG tablet Take 1 tablet (50 mg total) by mouth daily. 90 tablet 3  . atorvastatin (LIPITOR) 80 MG tablet TAKE 1 TABLET EVERY EVENING 90 tablet 1  . azelastine (ASTELIN) 0.1 % nasal spray Place 2 sprays into both nostrils 2 (two) times daily. Use in each nostril as directed 30 mL 5  . benazepril (LOTENSIN) 20 MG tablet Take 1 tablet (20 mg total) by mouth daily. 90 tablet 0  . budesonide-formoterol (SYMBICORT) 160-4.5 MCG/ACT inhaler Inhale 2 puffs 2 (two) times daily into the lungs. 3 Inhaler 1  . Misc. Devices (PULSE OXIMETER) MISC 1 application by Does not apply route as needed. 1 each 0  . ondansetron (ZOFRAN-ODT) 4 MG disintegrating tablet PLACE 1 TO 2 TABLETS ON TONGUE AND ALLOW TO DISSOLVE EVERY 8 HOURS AS NEEDED FOR NAUSEA / VOMITING 30 tablet 0  . SYNTHROID 125 MCG tablet Take 1 tablet (125 mcg total) by mouth daily before breakfast. 90 tablet 3  . Tiotropium Bromide Monohydrate (SPIRIVA RESPIMAT) 2.5 MCG/ACT AERS Inhale 2 puffs into the lungs daily. 3 Inhaler 3   No current facility-administered medications for this visit.  Objective: BP 138/82 (BP Location: Left Arm, Patient Position: Sitting, Cuff Size: Normal)   Pulse 62   Temp (!) 97.5 F (36.4 C) (Oral)   Ht 5\' 9"  (1.753 m)   Wt 148 lb 12.8 oz (67.5 kg)   SpO2 96%   BMI 21.97 kg/m  Gen: NAD, resting comfortably CV: RRR no murmurs rubs or gallops Lungs: CTAB no crackles, wheeze, rhonchi Abdomen: soft/nontender/nondistended/normal bowel sounds. .  Ext: no edema Skin: warm, dry Neuro: normal gait  Assessment/Plan:  Hypertension S: controlled on amlodipine 5mg , atenolol 50mg  BID, benazepril 20mg  BP Readings from Last 3 Encounters:   02/20/18 138/82  02/06/18 132/76  01/25/18 (!) 145/84  A/P: We discussed blood pressure goal of <140/90. Continue current meds  Allergies S:  2 weeks throat itchy and some cough. Has been using asteline- has helped some. Also taking zyrtec. Overall this is the best he has felt in sometime even with this itchy throat.  A/P: will continue to monitor  Hypothyroidism (acquired) S: has always been on synthroid.  Lab Results  Component Value Date   TSH 3.42 04/22/2017   On thyroid medication-synthroid 125 mcg A/P: update tsh. Offered change to levothyroxine- states he has never been on with repeat 6 week tsh- he declines  Future Appointments  Date Time Provider G. L. [facility]  04/03/2018  9:00 AM Juanito Doom, MD [facility] None   Advised CPE in coming months  Lab/Order associations: Hypothyroidism (acquired) - Plan: TSH  Meds ordered this encounter  Medications  . SYNTHROID 125 MCG tablet    Sig: Take 1 tablet (125 mcg total) by mouth daily before breakfast.    Dispense:  90 tablet    Refill:  3    Return precautions advised.  Garret Reddish, MD

## 2018-03-02 ENCOUNTER — Ambulatory Visit: Payer: Medicare Other | Admitting: Pulmonary Disease

## 2018-03-27 ENCOUNTER — Other Ambulatory Visit: Payer: Self-pay | Admitting: Pulmonary Disease

## 2018-03-27 ENCOUNTER — Other Ambulatory Visit: Payer: Medicare Other

## 2018-03-27 DIAGNOSIS — R0600 Dyspnea, unspecified: Secondary | ICD-10-CM

## 2018-03-28 LAB — IGE: IgE (Immunoglobulin E), Serum: 111 kU/L (ref <=114)

## 2018-04-03 ENCOUNTER — Ambulatory Visit: Payer: Medicare Other | Admitting: Pulmonary Disease

## 2018-04-03 ENCOUNTER — Encounter: Payer: Self-pay | Admitting: Pulmonary Disease

## 2018-04-03 VITALS — BP 134/70 | HR 72 | Ht 69.0 in | Wt 151.0 lb

## 2018-04-03 DIAGNOSIS — J449 Chronic obstructive pulmonary disease, unspecified: Secondary | ICD-10-CM

## 2018-04-03 MED ORDER — ALBUTEROL SULFATE HFA 108 (90 BASE) MCG/ACT IN AERS: 2.0000 | INHALATION_SPRAY | Freq: Four times a day (QID) | RESPIRATORY_TRACT | 5 refills | Status: DC | PRN

## 2018-04-03 NOTE — Progress Notes (Signed)
Subjective:    Patient ID: Brian Graham, male    DOB: 05-Jul-1951, 67 y.o.   MRN: [id number]  Synopsis: First referred in 2017 for severe COPD after being followed by the pulmonary group in [place] before moving to [place]. Smoked 40 years, quit in 2012   HPI Chief Complaint  Patient presents with  . Follow-up    ROV   He has been doing well since the last visit.  No cases of bronchitis or pneumonia.  He says that he had the UV air filter removed from his home and ever since he is done that his breathing has improved significantly.  He has been compliant with his inhaled medicines.  He is exercising regularly and cutting his grass, doing heavy yard work without difficulty.  Past Medical History:  Diagnosis Date  . Anxiety   . Arthritis    "left knee" (08/05/2016)  . Bruises easily   . CAD (coronary artery disease) of artery bypass graft 08/05/2016   Around age 89. CABG - 3 vessel.   . Chronic kidney disease   . Colon polyp   . COPD, severe ([place]) 08/23/2016   Alpha 1 studies normal per prior pulmonary group notes Smoked 40 years, quit in 2012 June 2016 PFT from prior pulmonary group: "significant obstruction" FEV1 1.58L (47% pred), Residual volume 171% pred, DLCO 59% pred Simple Spirometry>> 09/15/2016 ratio 42% FEV1 1.02 L / 31%   . Essential tremor 06/11/2016   Plans to see Dr. Carles Collet  . Former smoker 09/01/2016   Quit 2012. 40 pack years at least.   . GERD (gastroesophageal reflux disease)   . Headache   . History of blood transfusion 08/1997   "when he had his heart surgery"  . History of shingles 1970-2013 X 3  . HTN (hypertension) 08/05/2016   Amlodipine 5mg , atenolol 50mg  BID, benazepril 20mg   . Hyperlipemia   . Hypothyroidism   . Lumbar disc disease   . Myocardial infarction ([place]) 08/1997  . Peripheral vascular disease ([place])   . PONV (postoperative nausea and vomiting)   . Urinary tract infection 09/03/2017  . Wears glasses       Review of Systems    Constitutional: Negative for chills, fatigue and fever.  HENT: Negative for nosebleeds, postnasal drip and rhinorrhea.   Respiratory: Positive for cough, shortness of breath and wheezing.   Cardiovascular: Negative for chest pain, palpitations and leg swelling.       Objective:   Physical Exam  Vitals:   04/03/18 0839  BP: 134/70  Pulse: 72  SpO2: 96%  Weight: 151 lb (68.5 kg)  Height: 5\' 9"  (1.753 m)    Gen: well appearing HENT: OP clear, TM's clear, neck supple PULM: CTA B, normal percussion CV: RRR, no mgr, trace edema GI: BS+, soft, nontender Derm: no cyanosis or rash Psyche: normal mood and affect      CBC    Component Value Date/Time   WBC 9.0 12/15/2017 0927   RBC 4.52 12/15/2017 0927   HGB 13.7 12/15/2017 0927   HCT 41.4 12/15/2017 0927   PLT 262.0 12/15/2017 0927   MCV 91.5 12/15/2017 0927   MCH 30.3 10/05/2017 0930   MCHC 33.2 12/15/2017 0927   RDW 15.9 (H) 12/15/2017 0927   LYMPHSABS 1.2 12/15/2017 0927   MONOABS 0.8 12/15/2017 0927   EOSABS 0.3 12/15/2017 0927   BASOSABS 0.1 12/15/2017 0927   Labs: Alpha 1 studies normal per prior pulmonary group notes 2014 M-M January 2019 serum  IgE elevated at 147 May 2019 serum IgE normal 111  Chest imaging: Images from May 2018 CT chest independently reviewed showing moderate to severe emphysema, airway thickening but no mass or lesion August 2018 chest x-ray images independently reviewed showing emphysema, no mass, no infiltrate  PFT: June 2016 PFT from prior pulmonary group: "significant obstruction" FEV1 1.58L (47% pred), Residual volume 171% pred, DLCO 59% pred Simple Spirometry>> 09/15/2016 ratio 42% FEV1 1.02 L / 31%  Microbiology: January 2019 sputum culture AFB negative, fungus negative, bacteria negative     Assessment & Plan:   COPD with asthma ([place])  Discussion: This has been a stable interval for Brian Graham.  Since he had the air filtration system removed from his home his breathing  has improved significantly.  He was having an allergic reaction but this is significantly changed.  Plan: COPD with asthma: Continue Symbicort twice a day Continue Spiriva daily Continue albuterol as needed Flu shot in the fall  We will see you back in 6 months or sooner if needed   Current Outpatient Medications:  .  acetaminophen (TYLENOL) 500 MG tablet, Take 1,000 mg by mouth every 6 (six) hours as needed for mild pain., Disp: , Rfl:  .  albuterol (PROVENTIL HFA;VENTOLIN HFA) 108 (90 Base) MCG/ACT inhaler, Inhale 2 puffs into the lungs every 6 (six) hours as needed for wheezing or shortness of breath., Disp: 1 Inhaler, Rfl: 5 .  albuterol (PROVENTIL) (2.5 MG/3ML) 0.083% nebulizer solution, Take 3 mLs (2.5 mg total) by nebulization every 6 (six) hours as needed for wheezing or shortness of breath., Disp: 360 mL, Rfl: 11 .  amLODipine (NORVASC) 5 MG tablet, Take 1 tablet (5 mg total) by mouth daily., Disp: 90 tablet, Rfl: 3 .  atenolol (TENORMIN) 50 MG tablet, Take 1 tablet (50 mg total) by mouth daily., Disp: 90 tablet, Rfl: 3 .  atorvastatin (LIPITOR) 80 MG tablet, TAKE 1 TABLET EVERY EVENING, Disp: 90 tablet, Rfl: 1 .  azelastine (ASTELIN) 0.1 % nasal spray, Place 2 sprays into both nostrils 2 (two) times daily. Use in each nostril as directed, Disp: 30 mL, Rfl: 5 .  benazepril (LOTENSIN) 20 MG tablet, TAKE 1 TABLET DAILY, Disp: 90 tablet, Rfl: 0 .  Misc. Devices (PULSE OXIMETER) MISC, 1 application by Does not apply route as needed., Disp: 1 each, Rfl: 0 .  ondansetron (ZOFRAN-ODT) 4 MG disintegrating tablet, PLACE 1 TO 2 TABLETS ON TONGUE AND ALLOW TO DISSOLVE EVERY 8 HOURS AS NEEDED FOR NAUSEA / VOMITING, Disp: 30 tablet, Rfl: 0 .  SYMBICORT 160-4.5 MCG/ACT inhaler, USE 2 INHALATIONS TWICE A DAY, Disp: 30.6 g, Rfl: 1 .  SYNTHROID 125 MCG tablet, Take 1 tablet (125 mcg total) by mouth daily before breakfast., Disp: 90 tablet, Rfl: 3 .  Tiotropium Bromide Monohydrate (SPIRIVA RESPIMAT)  2.5 MCG/ACT AERS, Inhale 2 puffs into the lungs daily., Disp: 3 Inhaler, Rfl: 3

## 2018-04-03 NOTE — Patient Instructions (Signed)
COPD with asthma: Continue Symbicort twice a day Continue Spiriva daily Continue albuterol as needed Flu shot in the fall  We will see you back in 6 months or sooner if needed

## 2018-04-10 ENCOUNTER — Ambulatory Visit (INDEPENDENT_AMBULATORY_CARE_PROVIDER_SITE_OTHER)
Admission: RE | Admit: 2018-04-10 | Discharge: 2018-04-10 | Disposition: A | Discharge status: Home / Self Care | Payer: Medicare Other | Source: Ambulatory Visit | Attending: Acute Care | Admitting: Acute Care

## 2018-04-10 DIAGNOSIS — F17210 Nicotine dependence, cigarettes, uncomplicated: Secondary | ICD-10-CM | POA: Diagnosis not present

## 2018-04-12 ENCOUNTER — Other Ambulatory Visit: Payer: Self-pay | Admitting: Acute Care

## 2018-04-12 DIAGNOSIS — Z122 Encounter for screening for malignant neoplasm of respiratory organs: Secondary | ICD-10-CM

## 2018-04-12 DIAGNOSIS — F17210 Nicotine dependence, cigarettes, uncomplicated: Secondary | ICD-10-CM

## 2018-04-18 ENCOUNTER — Other Ambulatory Visit: Payer: Self-pay | Admitting: Pulmonary Disease

## 2018-04-18 ENCOUNTER — Telehealth: Payer: Self-pay | Admitting: Pulmonary Disease

## 2018-04-18 MED ORDER — ALBUTEROL SULFATE (2.5 MG/3ML) 0.083% IN NEBU: 2.5000 mg | INHALATION_SOLUTION | Freq: Four times a day (QID) | RESPIRATORY_TRACT | 11 refills | Status: DC | PRN

## 2018-04-18 NOTE — Telephone Encounter (Signed)
Patient called back and stated that he needs the albuterol for his nebulizer sent in the [facility]. Confirmed pharmacy with patient and Rx sent. Nothing further is needed at this time.

## 2018-04-18 NOTE — Telephone Encounter (Signed)
Attempted to call patient, no answer, message left to call back.  

## 2018-05-08 ENCOUNTER — Other Ambulatory Visit: Payer: Self-pay | Admitting: Family Medicine

## 2018-05-08 NOTE — Progress Notes (Signed)
Cardiology Office Note   Date:  05/09/2018   ID:  Brian Graham, [name] 26-Nov-1950, MRN [id number]  PCP:  [name], MD  Cardiologist:   Minus Breeding, MD  Referring:  [name], MD   Chief Complaint  Patient presents with  . Coronary Artery Disease      History of Present Illness: Brian Graham is a 67 y.o. male who is referred by [name], MD for evaluation of CAD.  He moved to this area from [place] and [place].  He is limited by joint pain and some COPD.  He is now followed by Dr. Bridgett Larsson of [facility] and Dr. Lake Bells of pulmonary.    Dr. Bridgett Larsson performed left common iliac stenting and the patient's back pain has improved.  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  He had that resected in November.  He said that since then he is felt so much better.  He has not had a chronic infections.  He is been out of his yard exercising doing yard work and goes [employer].  He walked 24,000 steps recently in [place].  He denies any chest pressure, neck or arm discomfort.  He said no shortness of breath, PND or apnea.  No palpitations, presyncope or syncope.   Past Medical History:  Diagnosis Date  . Anxiety   . Arthritis    "left knee" (08/05/2016)  . Bruises easily   . CAD (coronary artery disease) of artery bypass graft 08/05/2016   Around age 38. CABG - 3 vessel.   . Chronic kidney disease   . Colon polyp   . COPD, severe ([place]) 08/23/2016   Alpha 1 studies normal per prior pulmonary group notes Smoked 40 years, quit in 2012 June 2016 PFT from prior pulmonary group: "significant obstruction" FEV1 1.58L (47% pred), Residual volume 171% pred, DLCO 59% pred Simple Spirometry>> 09/15/2016 ratio 42% FEV1 1.02 L / 31%   . Essential tremor 06/11/2016   Plans to see Dr. Carles Collet  . Former smoker 09/01/2016   Quit 2012. 40 pack years at least.   . GERD (gastroesophageal reflux disease)   . Headache   .  History of blood transfusion 08/1997   "when he had his heart surgery"  . History of shingles 1970-2013 X 3  . HTN (hypertension) 08/05/2016   Amlodipine 5mg , atenolol 50mg  BID, benazepril 20mg   . Hyperlipemia   . Hypothyroidism   . Lumbar disc disease   . Myocardial infarction ([place]) 08/1997  . Peripheral vascular disease ([place])   . PONV (postoperative nausea and vomiting)   . Urinary tract infection 09/03/2017  . Wears glasses     Past Surgical History:  Procedure Laterality Date  . AORTOGRAM Right 08/31/2017   Procedure: AORTOGRAM BILATERAL PELVIC ANGIOGRAM WITH LEFT ILIAC ARTERY STENT;  Surgeon: Conrad Lake Bryan, MD;  Location: [facility];  Service: Vascular;  Laterality: Right;  . BACK SURGERY    . CARDIAC CATHETERIZATION  08/1997  . CORONARY ARTERY BYPASS GRAFT  09/12/1997   "triple"  . INGUINAL HERNIA REPAIR Right 1961  . KNEE RECONSTRUCTION Left 1960s - 1974 X 4  . LAPAROSCOPIC ABLATION RENAL MASS  ~ 2014   "large abscess/pus ball"  . [place]   "they were wedged in the bowel"  . PATELLA FRACTURE SURGERY Left 1963  . PATELLA FRACTURE SURGERY Left ~ 1965   "removed knee cap"  . ROBOT  ASSISTED LAPAROSCOPIC NEPHRECTOMY Left 10/07/2017   Procedure: XI ROBOTIC ASSISTED LAPAROSCOPIC NEPHRECTOMY OF PELVIC KIDNEY;  Surgeon: Alexis Frock, MD;  [facility];  Service: Urology;  Laterality: Left;  Place robot patient tower at foot of bed like prostate per Dr. Tresa Moore. Pull extra staple loads.  . TONSILLECTOMY       Current Outpatient Medications  Medication Sig Dispense Refill  . acetaminophen (TYLENOL) 500 MG tablet Take 1,000 mg by mouth every 6 (six) hours as needed for mild pain.    Marland Kitchen albuterol (PROVENTIL HFA;VENTOLIN HFA) 108 (90 Base) MCG/ACT inhaler Inhale 2 puffs into the lungs every 6 (six) hours as needed for wheezing or shortness of breath. 1 Inhaler 5  . albuterol (PROVENTIL) (2.5 MG/3ML) 0.083% nebulizer solution Take 3 mLs (2.5 mg total) by  nebulization every 6 (six) hours as needed for wheezing or shortness of breath. DX: J44.9 300 mL 11  . amLODipine (NORVASC) 5 MG tablet Take 1 tablet (5 mg total) by mouth daily. 90 tablet 3  . atenolol (TENORMIN) 50 MG tablet Take 1 tablet (50 mg total) by mouth daily. 90 tablet 3  . atorvastatin (LIPITOR) 80 MG tablet TAKE 1 TABLET EVERY EVENING 90 tablet 1  . azelastine (ASTELIN) 0.1 % nasal spray Place 2 sprays into both nostrils 2 (two) times daily. Use in each nostril as directed 30 mL 5  . benazepril (LOTENSIN) 20 MG tablet Take 20 mg by mouth daily.    . Misc. Devices (PULSE OXIMETER) MISC 1 application by Does not apply route as needed. 1 each 0  . ondansetron (ZOFRAN-ODT) 4 MG disintegrating tablet PLACE 1 TO 2 TABLETS ON TONGUE AND ALLOW TO DISSOLVE EVERY 8 HOURS AS NEEDED FOR NAUSEA / VOMITING 30 tablet 0  . SYMBICORT 160-4.5 MCG/ACT inhaler USE 2 INHALATIONS TWICE A DAY 30.6 g 1  . SYNTHROID 125 MCG tablet Take 1 tablet (125 mcg total) by mouth daily before breakfast. 90 tablet 3  . Tiotropium Bromide Monohydrate (SPIRIVA RESPIMAT) 2.5 MCG/ACT AERS Inhale 2 puffs into the lungs daily. 3 Inhaler 3   No current facility-administered medications for this visit.     Allergies:   Keflex [cephalexin]; Sulfa antibiotics; Ciprofloxacin; Levaquin [levofloxacin]; and Omeprazole    ROS:  Please see the history of present illness.   Otherwise, review of systems are positive for frequent UTIs, 40 lb weight loss over four years with negative work up..   All other systems are reviewed and negative.    PHYSICAL EXAM: VS:  BP (!) 148/88   Pulse 67   Ht 5\' 9"  (1.753 m)   Wt 151 lb (68.5 kg)   SpO2 98%   BMI 22.30 kg/m  , BMI Body mass index is 22.3 kg/m.  GENERAL:  Well appearing NECK:  No jugular venous distention, waveform within normal limits, carotid upstroke brisk and symmetric, no bruits, no thyromegaly LUNGS:  Clear to auscultation bilaterally CHEST:  Unremarkable HEART:  PMI not  displaced or sustained,S1 and S2 within normal limits, no S3, no S4, no clicks, no rubs, no murmurs ABD:  Flat, positive bowel sounds normal in frequency in pitch, no bruits, no rebound, no guarding, no midline pulsatile mass, no hepatomegaly, no splenomegaly EXT:  2 plus pulses throughout, no edema, no cyanosis no clubbing   EKG:  EKG is ordered today. The ekg ordered today demonstrates sinus rhythm, rate 63, axis within normal limits, intervals within normal limits, no acute ST-T wave changes.   Recent Labs: 07/10/2017: B Natriuretic  Peptide 209.6 08/13/2017: ALT 20 10/08/2017: BUN 17; Creatinine, Ser 1.21; Potassium 4.8; Sodium 135 12/15/2017: Hemoglobin 13.7; Platelets 262.0 02/20/2018: TSH 3.11    Lipid Panel    Component Value Date/Time   CHOL 141 05/06/2017 0923   TRIG 88 05/06/2017 0923   HDL 55 05/06/2017 0923   CHOLHDL 2.6 05/06/2017 0923   LDLCALC 68 05/06/2017 0923       Wt Readings from Last 3 Encounters:  05/09/18 151 lb (68.5 kg)  04/03/18 151 lb (68.5 kg)  02/20/18 148 lb 12.8 oz (67.5 kg)      Other studies Reviewed: Additional studies/ records that were reviewed today include: [facility] and Urology notes Review of the above records demonstrates:     ASSESSMENT AND PLAN:    CAD:  Hisotry of CABG.   The patient has no new sypmtoms.  No further cardiovascular testing is indicated.  We will continue with aggressive risk reduction and meds as listed.  HTN: Blood pressure very mildly elevated today.  However, it is typically in the 902 systolic that were 11D.  I think this is fine and I am not can make any changes on his meds but he will keep a blood pressure diary.  DYSLIPIDEMIA: His LDL last June was 16 with an HDL of 55.  He will continue the meds as listed. He is due to have this repeated.   PVD:  He is followed by [facility] and feels much better since his stent.   TOBACCO ABUSE:  He has quit smoking.    Current medicines are reviewed at length with the patient  today.  The patient does not have concerns regarding medicines.  The following changes have been made:  None  Labs/ tests ordered today include:  None  Orders Placed This Encounter  Procedures  . EKG 12-Lead     Disposition:   FU with me in one year.    Signed, Minus Breeding, MD  05/09/2018 11:01 AM    [place]

## 2018-05-09 ENCOUNTER — Encounter: Payer: Self-pay | Admitting: Cardiology

## 2018-05-09 ENCOUNTER — Ambulatory Visit: Payer: Medicare Other | Admitting: Cardiology

## 2018-05-09 VITALS — BP 148/88 | HR 67 | Ht 69.0 in | Wt 151.0 lb

## 2018-05-09 DIAGNOSIS — I10 Essential (primary) hypertension: Secondary | ICD-10-CM | POA: Diagnosis not present

## 2018-05-09 DIAGNOSIS — I25810 Atherosclerosis of coronary artery bypass graft(s) without angina pectoris: Secondary | ICD-10-CM

## 2018-05-09 DIAGNOSIS — E785 Hyperlipidemia, unspecified: Secondary | ICD-10-CM

## 2018-05-09 NOTE — Patient Instructions (Signed)
Medication Instructions:  Continue current medications  If you need a refill on your cardiac medications before your next appointment, please call your pharmacy.  Labwork: None Ordered   Testing/Procedures: None Ordered  Follow-Up: Your physician wants you to follow-up in: 1 Year. You should receive a reminder letter in the mail two months in advance. If you do not receive a letter, please call our office [phone].    Thank you for choosing [facility]!!      

## 2018-05-16 ENCOUNTER — Other Ambulatory Visit: Payer: Self-pay | Admitting: Family Medicine

## 2018-05-22 ENCOUNTER — Ambulatory Visit (INDEPENDENT_AMBULATORY_CARE_PROVIDER_SITE_OTHER): Payer: Medicare Other | Admitting: Family Medicine

## 2018-05-22 ENCOUNTER — Encounter: Payer: Self-pay | Admitting: Family Medicine

## 2018-05-22 VITALS — BP 138/80 | HR 66 | Temp 97.8°F | Ht 69.0 in | Wt 151.2 lb

## 2018-05-22 DIAGNOSIS — Z125 Encounter for screening for malignant neoplasm of prostate: Secondary | ICD-10-CM

## 2018-05-22 DIAGNOSIS — I25810 Atherosclerosis of coronary artery bypass graft(s) without angina pectoris: Secondary | ICD-10-CM

## 2018-05-22 DIAGNOSIS — E039 Hypothyroidism, unspecified: Secondary | ICD-10-CM | POA: Diagnosis not present

## 2018-05-22 DIAGNOSIS — G250 Essential tremor: Secondary | ICD-10-CM

## 2018-05-22 DIAGNOSIS — I10 Essential (primary) hypertension: Secondary | ICD-10-CM | POA: Diagnosis not present

## 2018-05-22 DIAGNOSIS — I700 Atherosclerosis of aorta: Secondary | ICD-10-CM

## 2018-05-22 DIAGNOSIS — Z0000 Encounter for general adult medical examination without abnormal findings: Secondary | ICD-10-CM

## 2018-05-22 DIAGNOSIS — E785 Hyperlipidemia, unspecified: Secondary | ICD-10-CM | POA: Diagnosis not present

## 2018-05-22 DIAGNOSIS — I70212 Atherosclerosis of native arteries of extremities with intermittent claudication, left leg: Secondary | ICD-10-CM | POA: Diagnosis not present

## 2018-05-22 DIAGNOSIS — J449 Chronic obstructive pulmonary disease, unspecified: Secondary | ICD-10-CM

## 2018-05-22 DIAGNOSIS — Z87891 Personal history of nicotine dependence: Secondary | ICD-10-CM | POA: Diagnosis not present

## 2018-05-22 LAB — TSH: TSH: 1.38 u[IU]/mL (ref 0.35–4.50)

## 2018-05-22 LAB — LIPID PANEL
Cholesterol: 167 mg/dL (ref 0–200)
HDL: 64.9 mg/dL (ref >39.00)
LDL Cholesterol: 68 mg/dL (ref 0–99)
NONHDL: 101.86
Total CHOL/HDL Ratio: 3
Triglycerides: 168 mg/dL — ABNORMAL HIGH (ref 0.0–149.0)
VLDL: 33.6 mg/dL (ref 0.0–40.0)

## 2018-05-22 LAB — COMPREHENSIVE METABOLIC PANEL
ALK PHOS: 70 U/L (ref 39–117)
ALT: 12 U/L (ref 0–53)
AST: 17 U/L (ref 0–37)
Albumin: 4.3 g/dL (ref 3.5–5.2)
BUN: 14 mg/dL (ref 6–23)
CO2: 34 meq/L — AB (ref 19–32)
Calcium: 9.5 mg/dL (ref 8.4–10.5)
Chloride: 101 mEq/L (ref 96–112)
Creatinine, Ser: 1.04 mg/dL (ref 0.40–1.50)
GFR: 75.51 mL/min (ref >60.00)
GLUCOSE: 88 mg/dL (ref 70–99)
POTASSIUM: 5.3 meq/L — AB (ref 3.5–5.1)
Sodium: 142 mEq/L (ref 135–145)
TOTAL PROTEIN: 7 g/dL (ref 6.0–8.3)
Total Bilirubin: 0.8 mg/dL (ref 0.2–1.2)

## 2018-05-22 LAB — CBC
HEMATOCRIT: 43.6 % (ref 39.0–52.0)
HEMOGLOBIN: 14.6 g/dL (ref 13.0–17.0)
MCHC: 33.5 g/dL (ref 30.0–36.0)
MCV: 92.5 fl (ref 78.0–100.0)
Platelets: 259 10*3/uL (ref 150.0–400.0)
RBC: 4.71 Mil/uL (ref 4.22–5.81)
RDW: 14.3 % (ref 11.5–15.5)
WBC: 6 10*3/uL (ref 4.0–10.5)

## 2018-05-22 LAB — PSA: PSA: 0.6 ng/mL (ref 0.10–4.00)

## 2018-05-22 NOTE — Assessment & Plan Note (Signed)
HLD- last LDL was 68 in JUne 2018 on atorvastatin 80mg . Update lipids today.

## 2018-05-22 NOTE — Assessment & Plan Note (Signed)
Hypertension- BP was up slightly at cardiology. He is on amlodipine 5mg , atenolol 50mg  BID, benazepril 20mg . His BP is controlled on repeat today <140/90 though would prefer systolic closer to 469 BP Readings from Last 3 Encounters:  05/22/18 138/80  05/09/18 (!) 148/88  04/03/18 134/70

## 2018-05-22 NOTE — Progress Notes (Signed)
Phone: [phone]  Subjective:  Patient presents today for their annual physical. Chief complaint-noted.   See problem oriented charting- ROS- full  review of systems was completed and negative except for: congestion, post nasal drip, eye itching, cold intolerance, joint pain, seasonal allergies, tremors  The following were reviewed and entered/updated in epic: Past Medical History:  Diagnosis Date  . Anxiety   . Arthritis    "left knee" (08/05/2016)  . Bruises easily   . CAD (coronary artery disease) of artery bypass graft 08/05/2016   Around age 21. CABG - 3 vessel.   . Chronic kidney disease   . Colon polyp   . COPD, severe ([place]) 08/23/2016   Alpha 1 studies normal per prior pulmonary group notes Smoked 40 years, quit in 2012 June 2016 PFT from prior pulmonary group: "significant obstruction" FEV1 1.58L (47% pred), Residual volume 171% pred, DLCO 59% pred Simple Spirometry>> 09/15/2016 ratio 42% FEV1 1.02 L / 31%   . Essential tremor 06/11/2016   Plans to see Dr. Carles Collet  . Former smoker 09/01/2016   Quit 2012. 40 pack years at least.   . GERD (gastroesophageal reflux disease)   . Headache   . History of blood transfusion 08/1997   "when he had his heart surgery"  . History of shingles 1970-2013 X 3  . HTN (hypertension) 08/05/2016   Amlodipine 5mg , atenolol 50mg  BID, benazepril 20mg   . Hyperlipemia   . Hypothyroidism   . Lumbar disc disease   . Myocardial infarction ([place]) 08/1997  . Peripheral vascular disease ([place])   . PONV (postoperative nausea and vomiting)   . Urinary tract infection 09/03/2017  . Wears glasses    Patient Active Problem List   Diagnosis Date Noted  . Atherosclerosis of native arteries of extremity with intermittent claudication ([place]) 07/06/2017    Priority: High  . Weight loss 04/23/2017    Priority: High  . Former smoker 09/01/2016    Priority: High  . COPD, severe ([place]) 08/23/2016    Priority: High  . Hx of CABG 08/05/2016    Priority: High    . CAD (coronary artery disease) of artery bypass graft 08/05/2016    Priority: High  . History of adenomatous polyp of colon 04/22/2017    Priority: Medium  . Hyperlipidemia 04/04/2017    Priority: Medium  . Hypothyroidism (acquired) 04/04/2017    Priority: Medium  . Essential hypertension 08/05/2016    Priority: Medium  . Essential tremor 06/11/2016    Priority: Medium  . Aortic atherosclerosis ([place]) 04/13/2017    Priority: Low  . Seasonal allergies 04/04/2017    Priority: Low  . Myalgia 10/04/2016    Priority: Low  . Dysuria 08/23/2016    Priority: Low  . Chronic pain of both knees 08/23/2016    Priority: Low  . Tension headache 06/11/2016    Priority: Low  . Perennial allergic rhinitis 01/24/2018  . 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 Urinary tract infection 09/03/2017  . Constipation 09/03/2017  . Anxiety 07/24/2017  . Pyelonephritis 04/23/2017   Past Surgical History:  Procedure Laterality Date  . AORTOGRAM Right 08/31/2017   Procedure: AORTOGRAM BILATERAL PELVIC ANGIOGRAM WITH LEFT ILIAC ARTERY STENT;  Surgeon: Conrad [place]stown, MD;  Location: [facility];  Service: Vascular;  Laterality: Right;  . BACK SURGERY    . CARDIAC CATHETERIZATION  08/1997  . CORONARY ARTERY BYPASS GRAFT  09/12/1997   "triple"  . INGUINAL HERNIA REPAIR Right 1961  . KNEE  RECONSTRUCTION Left 1960s - 1974 X 4  . LAPAROSCOPIC ABLATION RENAL MASS  ~ 2014   "large abscess/pus ball"  . [place]   "they were wedged in the bowel"  . PATELLA FRACTURE SURGERY Left 1963  . PATELLA FRACTURE SURGERY Left ~ 1965   "removed knee cap"  . ROBOT ASSISTED LAPAROSCOPIC NEPHRECTOMY Left 10/07/2017   Procedure: XI ROBOTIC ASSISTED LAPAROSCOPIC NEPHRECTOMY OF PELVIC KIDNEY;  Surgeon: Alexis Frock, MD;  [facility];  Service: Urology;  Laterality: Left;  Place robot patient tower at foot of bed like prostate per Dr. Tresa Moore. Pull extra staple loads.  .  TONSILLECTOMY      Family History  Problem Relation Age of Onset  . Alcohol abuse Mother   . Alcohol abuse Father        not involved in life  . Alcoholism Sister   . Healthy Sister   . Healthy Sister   . Allergic rhinitis Neg Hx   . Angioedema Neg Hx   . Asthma Neg Hx   . Eczema Neg Hx   . Immunodeficiency Neg Hx   . Urticaria Neg Hx     Medications- reviewed and updated Current Outpatient Medications  Medication Sig Dispense Refill  . acetaminophen (TYLENOL) 500 MG tablet Take 1,000 mg by mouth every 6 (six) hours as needed for mild pain.    Marland Kitchen albuterol (PROVENTIL HFA;VENTOLIN HFA) 108 (90 Base) MCG/ACT inhaler Inhale 2 puffs into the lungs every 6 (six) hours as needed for wheezing or shortness of breath. 1 Inhaler 5  . albuterol (PROVENTIL) (2.5 MG/3ML) 0.083% nebulizer solution Take 3 mLs (2.5 mg total) by nebulization every 6 (six) hours as needed for wheezing or shortness of breath. DX: J44.9 300 mL 11  . amLODipine (NORVASC) 5 MG tablet TAKE 1 TABLET DAILY 90 tablet 1  . atenolol (TENORMIN) 50 MG tablet Take 1 tablet (50 mg total) by mouth daily. 90 tablet 3  . atorvastatin (LIPITOR) 80 MG tablet TAKE 1 TABLET EVERY EVENING 90 tablet 1  . azelastine (ASTELIN) 0.1 % nasal spray Place 2 sprays into both nostrils 2 (two) times daily. Use in each nostril as directed 30 mL 5  . benazepril (LOTENSIN) 20 MG tablet TAKE 1 TABLET DAILY 90 tablet 0  . Misc. Devices (PULSE OXIMETER) MISC 1 application by Does not apply route as needed. 1 each 0  . ondansetron (ZOFRAN-ODT) 4 MG disintegrating tablet PLACE 1 TO 2 TABLETS ON TONGUE AND ALLOW TO DISSOLVE EVERY 8 HOURS AS NEEDED FOR NAUSEA / VOMITING 30 tablet 0  . SYMBICORT 160-4.5 MCG/ACT inhaler USE 2 INHALATIONS TWICE A DAY 30.6 g 1  . SYNTHROID 125 MCG tablet Take 1 tablet (125 mcg total) by mouth daily before breakfast. 90 tablet 3  . Tiotropium Bromide Monohydrate (SPIRIVA RESPIMAT) 2.5 MCG/ACT AERS Inhale 2 puffs into the lungs  daily. 3 Inhaler 3   No current facility-administered medications for this visit.     Allergies-reviewed and updated Allergies  Allergen Reactions  . Keflex [Cephalexin] Diarrhea, Nausea Only and Other (See Comments)    Insomnia  . Sulfa Antibiotics Other (See Comments)    Insomnia  . Ciprofloxacin Diarrhea  . Levaquin [Levofloxacin] Other (See Comments)    insomnia  . Omeprazole Diarrhea    Social History   Social History Narrative   Lives with wife. 2 adopted daughters from [place].       Retired from [employer]    Objective: BP 138/80 (BP Location:  Left Arm, Cuff Size: Normal)   Pulse 66   Temp 97.8 F (36.6 C) (Oral)   Ht 5\' 9"  (1.753 m)   Wt 151 lb 3.2 oz (68.6 kg)   SpO2 98%   BMI 22.33 kg/m  Gen: NAD, resting comfortably HEENT: Mucous membranes are moist. Oropharynx normal Neck: no thyromegaly CV: RRR no murmurs rubs or gallops Lungs: CTAB no crackles, wheeze, rhonchi Abdomen: soft/nontender/nondistended/normal bowel sounds. No rebound or guarding.  Ext: no edema Skin: warm, dry Neuro: grossly normal, moves all extremities, PERRLA  He declines rectal exam  Shoulder: Inspection reveals no abnormalities, atrophy or asymmetry. Palpation is normal with no tenderness over AC joint or bicipital groove. ROM is full in all planes except abduction where limted to 135 degrees active, can move past this with passive assit Rotator cuff strength normal throughout. Signs of impingement with positive Neer and Hawkin's tests. Negative empty can. painful arc but no drop arm sign.  Assessment/Plan:  67 y.o. male presenting for annual physical.  Health Maintenance counseling: 1. Anticipatory guidance: Patient counseled regarding regular dental exams - dentures, eye exams -yearly, wearing seatbelts.  2. Risk factor reduction:  Advised patient of need for regular exercise and diet rich and fruits and vegetables to reduce risk of heart attack and stroke. Exercise- 3x a week.  Diet-reasonable diet, eating healthier- has been able to maintain his weight. No longer using ensure  Wt Readings from Last 3 Encounters:  05/22/18 151 lb 3.2 oz (68.6 kg)  05/09/18 151 lb (68.5 kg)  04/03/18 151 lb (68.5 kg)  3. Immunizations/screenings/ancillary studies- had Td at pharmacy- we are trying to get records. - We discussed shingrix availability issues  as well as coverage issues (part D medicare)- I recommended that patient get vaccine at the pharmacy  Immunization History  Administered Date(s) Administered  . Influenza Split 08/30/2013, 08/18/2016  . Influenza, High Dose Seasonal PF 09/14/2017  . Influenza,inj,Quad PF,6+ Mos 07/23/2016  . Influenza,inj,quad, With Preservative 07/23/2016  . Pneumococcal Conjugate-13 04/03/2013  . Pneumococcal Polysaccharide-23 06/10/2016  . Pneumococcal-Unspecified 04/03/2013, 07/02/2016  . Tdap 06/11/2007   Health Maintenance Due  Topic Date Due  . TETANUS/TDAP - getting records 06/10/2017   4. Prostate cancer screening- will get PSA with bloodwork. declines rectal exam. No nocturia at present. Follows with Dr. Tresa Moore but denies prostate cancre screening. No prior psa on file  5. Colon cancer screening - 07/05/12 with 10 year follow up 6. Skin cancer screening- no dermatologist. advised regular sunscreen use. Denies worrisome, changing, or new skin lesions.  7. Former smoker- saw Dr. Tresa Moore last in December and urine normal- will skip today. AAA screen negative 04/2017. 40 pack years. Enrolled in lung cancer screening with lug- rads 2. Also aortic atherosclerosis noted on 03/2018 on exam- discussed risk factor modification today.   Status of chronic or acute concerns   Left shoulder pain- hears some clicking in the shoulder. Some burning sensation in back of leg and cannot reach behind back. About 120 degrees abduction starts to really bother him. Has better range with forward flexion before he gets pain. No chest pain or shortness of breath- not  exertional.  aleve helps som- discussed with CAD wouldn't recommend regular use. Offered SM referral- he declines. He will do home exercises and follow up with Dr. Paulla Fore if symptoms worsen. Will stop any exercise causing more than 1-2/10 pain. Also discussed icing.  Allergies- remains on zyrtec and astelin. Many of ROS symptoms related to allergies  Gad7 of 2  today. Anxiety has done well recently with breathing issues better.   Umbilical hernia- not painful- wants to hold off on surgery. Easily reducible  Hypothyroidism (acquired) Hypothyroidism- on synthroid 171mcg. He prefers to remain on this- update tshx  Essential tremor Atenolol also helps his essential tremor some. Getting slightly worse- He is going to see Dr. Carles Collet for her opinion- recommended by Dr. Lake Bells. It told him she would take excellent care of him   Essential hypertension Hypertension- BP was up slightly at cardiology. He is on amlodipine 5mg , atenolol 50mg  BID, benazepril 20mg . His BP is controlled on repeat today <140/90 though would prefer systolic closer to 973 BP Readings from Last 3 Encounters:  05/22/18 138/80  05/09/18 (!) 148/88  04/03/18 134/70     CAD (coronary artery disease) of artery bypass graft CAD- follows with DR. Hochreina nd recently seen 6.18.19. Asymptomatic per his notes.    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 Remains on symbicort, spiriva with albuterol prn. Seen in may with 6 month follow up  Has done really well recently- doing yard work and golfing- breathing in drastically better shape than it was prior to air filtration system being removed  Atherosclerosis of native arteries of extremity with intermittent claudication ([place]) PAD- follows with vein and vascular- better since last intervention last year- had 100% stenosis of iliac artery now s/p revascularizatoin  Aortic atherosclerosis ([facility]) Risk factor  modification discussed. Update lipids  Hyperlipidemia HLD- last LDL was 68 in JUne 2018 on atorvastatin 80mg . Update lipids today.   Return in about 6 months (around 11/22/2018) for follow up- or sooner if needed.  Lab/Order associations: Essential hypertension - Plan: CBC, Comprehensive metabolic panel, Lipid panel, CANCELED: POCT Urinalysis Dipstick (Automated)  Hypothyroidism (acquired) - Plan: TSH  Screening for prostate cancer - Plan: PSA  Return precautions advised.  Garret Reddish, MD

## 2018-05-22 NOTE — Progress Notes (Signed)
Your CMET was normal (kidney, liver, and electrolytes, blood sugar) except for potassium being high. Team please repeat BMET sometime this week under hyperkalemia so we can see if this is a false positive (meaning its not really high). If it is high, may need to adjust medications.  Your CBC was normal (blood counts, infection fighting cells, platelets). Your cholesterol looks great Your thyroid was normal. Your PSA was low normal- will need to trend yearly but likely low risk for prostate cancer.

## 2018-05-22 NOTE — Assessment & Plan Note (Signed)
PAD- follows with vein and vascular- better since last intervention last year- had 100% stenosis of iliac artery now s/p revascularizatoin

## 2018-05-22 NOTE — Assessment & Plan Note (Signed)
Risk factor modification discussed. Update lipids

## 2018-05-22 NOTE — Assessment & Plan Note (Signed)
Hypothyroidism- on synthroid 192mcg. He prefers to remain on this- update tshx

## 2018-05-22 NOTE — Assessment & Plan Note (Signed)
COPD/asthma- breathing issues much better since getting air filtration system out of home. Remains on symbicort, spiriva with albuterol prn. Seen in may with 6 month follow up  Has done really well recently- doing yard work and golfing- breathing in drastically better shape than it was prior to air filtration system being removed

## 2018-05-22 NOTE — Patient Instructions (Addendum)
Health Maintenance Due  Topic Date Due  . TETANUS/TDAP - Patient had it done at [facility] in [place], but he doesn't know which one. Our team will call around and get these records.  06/10/2017   I would also like for you to sign up for an annual wellness visit with one of our nurses, Cassie or Manuela Schwartz, who both specialize in the annual wellness visit. This is a free benefit under medicare that may help [place] find additional ways to help you. Some highlights are reviewing medications, lifestyle, and doing a dementia screen.  Please check with your pharmacy to see if they have the shingrix vaccine. If they do- please get this immunization and update [place] by phone call or mychart with dates you receive the vaccine.  For shoulder left Do home exercises 3x a week for a month and then weekly  and follow up with Dr. Paulla Fore if symptoms worsen. Will stop any exercise causing more than 1-2/10 pain. Also discussed icing.   Please stop by the lab before you go.

## 2018-05-22 NOTE — Assessment & Plan Note (Signed)
Atenolol also helps his essential tremor some. Getting slightly worse- He is going to see Dr. Carles Collet for her opinion- recommended by Dr. Lake Bells. It told him she would take excellent care of him

## 2018-05-22 NOTE — Assessment & Plan Note (Signed)
CAD- follows with DR. Hochreina nd recently seen 6.18.19. Asymptomatic per his notes.

## 2018-05-23 ENCOUNTER — Other Ambulatory Visit: Payer: Self-pay

## 2018-05-23 DIAGNOSIS — E875 Hyperkalemia: Secondary | ICD-10-CM

## 2018-05-24 ENCOUNTER — Other Ambulatory Visit (INDEPENDENT_AMBULATORY_CARE_PROVIDER_SITE_OTHER): Payer: Medicare Other

## 2018-05-24 DIAGNOSIS — E875 Hyperkalemia: Secondary | ICD-10-CM | POA: Diagnosis not present

## 2018-05-24 LAB — COMPREHENSIVE METABOLIC PANEL
ALBUMIN: 4 g/dL (ref 3.5–5.2)
ALK PHOS: 68 U/L (ref 39–117)
ALT: 13 U/L (ref 0–53)
AST: 17 U/L (ref 0–37)
BILIRUBIN TOTAL: 0.9 mg/dL (ref 0.2–1.2)
BUN: 16 mg/dL (ref 6–23)
CALCIUM: 9.2 mg/dL (ref 8.4–10.5)
CO2: 31 mEq/L (ref 19–32)
CREATININE: 0.97 mg/dL (ref 0.40–1.50)
Chloride: 102 mEq/L (ref 96–112)
GFR: 81.83 mL/min (ref >60.00)
Glucose, Bld: 123 mg/dL — ABNORMAL HIGH (ref 70–99)
Potassium: 4.5 mEq/L (ref 3.5–5.1)
SODIUM: 141 meq/L (ref 135–145)
TOTAL PROTEIN: 6.4 g/dL (ref 6.0–8.3)

## 2018-06-04 ENCOUNTER — Other Ambulatory Visit: Payer: Self-pay | Admitting: Pulmonary Disease

## 2018-06-15 ENCOUNTER — Ambulatory Visit (INDEPENDENT_AMBULATORY_CARE_PROVIDER_SITE_OTHER): Payer: Medicare Other

## 2018-06-15 ENCOUNTER — Ambulatory Visit: Payer: Self-pay

## 2018-06-15 ENCOUNTER — Ambulatory Visit: Payer: Medicare Other | Admitting: Family Medicine

## 2018-06-15 ENCOUNTER — Encounter: Payer: Self-pay | Admitting: Family Medicine

## 2018-06-15 ENCOUNTER — Encounter: Payer: Self-pay | Admitting: Sports Medicine

## 2018-06-15 ENCOUNTER — Ambulatory Visit: Payer: Medicare Other | Admitting: Sports Medicine

## 2018-06-15 VITALS — BP 128/62 | HR 87 | Ht 69.0 in | Wt 154.0 lb

## 2018-06-15 VITALS — BP 128/62 | HR 87 | Temp 97.7°F | Ht 69.0 in | Wt 154.8 lb

## 2018-06-15 DIAGNOSIS — G8929 Other chronic pain: Secondary | ICD-10-CM

## 2018-06-15 DIAGNOSIS — M85822 Other specified disorders of bone density and structure, left upper arm: Secondary | ICD-10-CM

## 2018-06-15 DIAGNOSIS — M25512 Pain in left shoulder: Secondary | ICD-10-CM

## 2018-06-15 DIAGNOSIS — E785 Hyperlipidemia, unspecified: Secondary | ICD-10-CM

## 2018-06-15 DIAGNOSIS — R937 Abnormal findings on diagnostic imaging of other parts of musculoskeletal system: Secondary | ICD-10-CM | POA: Diagnosis not present

## 2018-06-15 DIAGNOSIS — M7532 Calcific tendinitis of left shoulder: Secondary | ICD-10-CM

## 2018-06-15 MED ORDER — ROSUVASTATIN CALCIUM 40 MG PO TABS: 40.0000 mg | ORAL_TABLET | Freq: Every day | ORAL | 3 refills | Status: DC

## 2018-06-15 NOTE — Progress Notes (Signed)
Subjective:  Brian Graham is a 67 y.o. year old very pleasant male patient who presents for/with See problem oriented charting ROS- no chest pain or shortness of breath. Has left shoulder pain with clicking, some clicking in right shoulder.    Past Medical History-  Patient Active Problem List   Diagnosis Date Noted  . Atherosclerosis of native arteries of extremity with intermittent claudication ([place]) 07/06/2017    Priority: High  . Weight loss 04/23/2017    Priority: High  . Former smoker 09/01/2016    Priority: High  . COPD, severe ([place]) 08/23/2016    Priority: High  . Hx of CABG 08/05/2016    Priority: High  . CAD (coronary artery disease) of artery bypass graft 08/05/2016    Priority: High  . History of adenomatous polyp of colon 04/22/2017    Priority: Medium  . Hyperlipidemia 04/04/2017    Priority: Medium  . Hypothyroidism (acquired) 04/04/2017    Priority: Medium  . Essential hypertension 08/05/2016    Priority: Medium  . Essential tremor 06/11/2016    Priority: Medium  . Aortic atherosclerosis ([place]) 04/13/2017    Priority: Low  . Seasonal allergies 04/04/2017    Priority: Low  . Myalgia 10/04/2016    Priority: Low  . Dysuria 08/23/2016    Priority: Low  . Chronic pain of both knees 08/23/2016    Priority: Low  . Tension headache 06/11/2016    Priority: Low  . Perennial allergic rhinitis 01/24/2018  . 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 Urinary tract infection 09/03/2017  . Constipation 09/03/2017  . Anxiety 07/24/2017  . Pyelonephritis 04/23/2017    Medications- reviewed and updated Current Outpatient Medications  Medication Sig Dispense Refill  . acetaminophen (TYLENOL) 500 MG tablet Take 1,000 mg by mouth every 6 (six) hours as needed for mild pain.    Marland Kitchen albuterol (PROVENTIL HFA;VENTOLIN HFA) 108 (90 Base) MCG/ACT inhaler Inhale 2 puffs into the lungs every 6 (six) hours as needed for wheezing or shortness  of breath. 1 Inhaler 5  . albuterol (PROVENTIL) (2.5 MG/3ML) 0.083% nebulizer solution Take 3 mLs (2.5 mg total) by nebulization every 6 (six) hours as needed for wheezing or shortness of breath. DX: J44.9 300 mL 11  . amLODipine (NORVASC) 5 MG tablet TAKE 1 TABLET DAILY 90 tablet 1  . atenolol (TENORMIN) 50 MG tablet Take 1 tablet (50 mg total) by mouth daily. 90 tablet 3  . azelastine (ASTELIN) 0.1 % nasal spray Place 2 sprays into both nostrils 2 (two) times daily. Use in each nostril as directed 30 mL 5  . benazepril (LOTENSIN) 20 MG tablet TAKE 1 TABLET DAILY 90 tablet 0  . Misc. Devices (PULSE OXIMETER) MISC 1 application by Does not apply route as needed. 1 each 0  . ondansetron (ZOFRAN-ODT) 4 MG disintegrating tablet PLACE 1 TO 2 TABLETS ON TONGUE AND ALLOW TO DISSOLVE EVERY 8 HOURS AS NEEDED FOR NAUSEA / VOMITING 30 tablet 0  . rosuvastatin (CRESTOR) 40 MG tablet Take 1 tablet (40 mg total) by mouth daily. 90 tablet 3  . SPIRIVA RESPIMAT 2.5 MCG/ACT AERS USE 2 INHALATIONS DAILY 12 g 3  . SYMBICORT 160-4.5 MCG/ACT inhaler USE 2 INHALATIONS TWICE A DAY 30.6 g 1  . SYNTHROID 125 MCG tablet Take 1 tablet (125 mcg total) by mouth daily before breakfast. 90 tablet 3   No current facility-administered medications for this visit.     Objective: BP 128/62 (BP Location: Left  Arm, Patient Position: Sitting, Cuff Size: Normal)   Pulse 87   Temp 97.7 F (36.5 C) (Oral)   Ht 5' 9"  (1.753 m)   Wt 154 lb 12.8 oz (70.2 kg)   SpO2 97%   BMI 22.86 kg/m  Gen: NAD, resting comfortably CV: RRR no murmurs rubs or gallops Lungs: CTAB no crackles, wheeze, rhonchi Ext: no edema Skin: warm, dry  Still with pain with abduction- starts at 90 degrees today and more severe than last visit- trouble reaching behind his back.   Dg Shoulder Left  Result Date: 06/15/2018 CLINICAL DATA:  LEFT shoulder pain after fall down steps 3 weeks ago. EXAM: LEFT SHOULDER - 2+ VIEW COMPARISON:  CT chest Apr 10, 2018  FINDINGS: The humeral head is well-formed and located. Proximal humeral diaphysis seal small exostosis. The subacromial, glenohumeral and acromioclavicular joint spaces are intact. Severe osteopenia with somewhat permeative appearance. No destructive bony lesions. Soft tissue planes are non-suspicious. Status post median sternotomy. IMPRESSION: No acute osseous process or advanced degenerative change. Severe osteopenia, given somewhat permeative appearance, recommend correlation for multiple myeloma. Electronically Signed   By: Elon Alas M.D.   On: 06/15/2018 15:17   [place] Msk Poct Ultrasound  Result Date: 06/15/2018 Gerda Diss, DO     2/95/1884  1:66 PM Duplicate from progress note: PROCEDURE NOTE: Ultrasound Guided: Injection: Left Pec Major Calcific tendinitis, Tendon origin Images were obtained and interpreted by myself, Teresa Coombs, DO Images have been saved and stored to PACS system. Images obtained on: GE S7 Ultrasound machine  ULTRASOUND FINDINGS: Significant calcific change with thickening and hypoechoic change within the insertion of the pectoralis major tendon Small amount of halo sign around the bicipital groove and biceps tendon.  Subscap, supraspinatus and infraspinatus/teres minor are intact.  Mild AC joint arthropathy with no pain with sono palpation. DESCRIPTION OF PROCEDURE: The patient's clinical condition is marked by substantial pain and/or significant functional disability. Other conservative therapy has not provided relief, is contraindicated, or not appropriate. There is a reasonable likelihood that injection will significantly improve the patient's pain and/or functional impairment.  After discussing the risks, benefits and expected outcomes of the injection and all questions were reviewed and answered, the patient wished to undergo the above named procedure.  Verbal consent was obtained. The ultrasound was used to identify the target structure and adjacent neurovascular  structures. The skin was then prepped in sterile fashion and the target structure was injected under direct visualization using sterile technique as below: Single injection performed as below: PREP: Alcohol and Ethel Chloride APPROACH:direct, single injection, 22g 1.5 in. INJECTATE: 1 cc 0.5% Marcaine and 1 cc 17m/mL DepoMedrol ASPIRATE: None DRESSING: Band-Aid Post procedural instructions including recommending icing and warning signs for infection were reviewed.   This procedure was well tolerated and there were no complications. IMPRESSION: Succesful Ultrasound Guided: Injection   Assessment/Plan:  Left shoulder pain S: left shoulder pain for at least 3-4 months now. Does some of the home exercises from handout but doesn't find very helpful and states they are difficult. Also now difficult to tuck shirt in the back. Pain in the left shoulder intensified after a fall recently within last few weeks. Since that time- He has some clicking in right shoulder but not much pain, both wrists are causing him to have pain. Right knee bothering him. Happens all day- not just worse in AM. He is worried about atorvastatin. Clicking sensation in each joint.   From physical earlier this month "Shoulder: Inspection  reveals no abnormalities, atrophy or asymmetry. Palpation is normal with no tenderness over AC joint or bicipital groove. ROM is full in all planes except abduction where limted to 135 degrees active, can move past this with passive assit Rotator cuff strength normal throughout. Signs of impingement with positive Neer and Hawkin's tests. Negative empty can. painful arc but no drop arm sign.  Left shoulder pain- hears some clicking in the shoulder. Some burning sensation in back of leg and cannot reach behind back. About 120 degrees abduction starts to really bother him. Has better range with forward flexion before he gets pain. No chest pain or shortness of breath- not exertional.  aleve helps som-  discussed with CAD wouldn't recommend regular use. Offered SM referral- he declines. He will do home exercises and follow up with Dr. Paulla Fore if symptoms worsen. Will stop any exercise causing more than 1-2/10 pain. Also discussed icing." A/P: Given worsening pain after fall- xray obtained. Also referred to Dr. Paulla Fore for consideration of injection. On x-ray signs of calcific tendonitis- this was injected by Dr. Paulla Fore today- hopeful for improvement  X-ray shows osteopenia and we will get a bone density. I do not suspect multiple myeloma but will get spep and free light chains due to radiology read. He will need to return for labs.   Hyperlipidemia Patient is concerned that his pains are coming from statin therapy with atorvastatin due to some reading he has done online about atorvastatin. I told him more likely related to his recent fall and underlying arthritic issues. He would still like to trial rosuvastatin 58m- I sent this in for him. Will update Dr. HPercival Spanishas well  Future Appointments  Date Time [place] 07/03/2018 11:00 AM RGerda Diss DO [facility]   Lab/Order associations: Chronic left shoulder pain - Plan: Ambulatory referral to Sports Medicine, DG Shoulder Left  Osteopenia of left upper arm  Abnormal bone xray  Hyperlipidemia, unspecified hyperlipidemia type  Meds ordered this encounter  Medications  . rosuvastatin (CRESTOR) 40 MG tablet    Sig: Take 1 tablet (40 mg total) by mouth daily.    Dispense:  90 tablet    Refill:  3    Return precautions advised.  SGarret Reddish MD

## 2018-06-15 NOTE — Progress Notes (Signed)
PROCEDURE NOTE:  Ultrasound Guided: Injection: Left Pec Major Calcific tendinitis, Tendon origin Images were obtained and interpreted by myself, Teresa Coombs, DO  Images have been saved and stored to PACS system. Images obtained on: GE S7 Ultrasound machine    ULTRASOUND FINDINGS:  Significant calcific change with thickening and hypoechoic change within the insertion of the pectoralis major tendon Small amount of halo sign around the bicipital groove and biceps tendon.  Subscap, supraspinatus and infraspinatus/teres minor are intact.  Mild AC joint arthropathy with no pain with sono palpation.  DESCRIPTION OF PROCEDURE:  The patient's clinical condition is marked by substantial pain and/or significant functional disability. Other conservative therapy has not provided relief, is contraindicated, or not appropriate. There is a reasonable likelihood that injection will significantly improve the patient's pain and/or functional impairment.   After discussing the risks, benefits and expected outcomes of the injection and all questions were reviewed and answered, the patient wished to undergo the above named procedure.  Verbal consent was obtained.  The ultrasound was used to identify the target structure and adjacent neurovascular structures. The skin was then prepped in sterile fashion and the target structure was injected under direct visualization using sterile technique as below:  Single injection performed as below: PREP: Alcohol and Ethel Chloride APPROACH:direct, single injection, 22g 1.5 in. INJECTATE: 1 cc 0.5% Marcaine and 1 cc 40mg /mL DepoMedrol ASPIRATE: None DRESSING: Band-Aid  Post procedural instructions including recommending icing and warning signs for infection were reviewed.    This procedure was well tolerated and there were no complications.   IMPRESSION: Succesful Ultrasound Guided: Injection

## 2018-06-15 NOTE — Procedures (Signed)
Duplicate from progress note: PROCEDURE NOTE:  Ultrasound Guided: Injection: Left Pec Major Calcific tendinitis, Tendon origin Images were obtained and interpreted by myself, Teresa Coombs, DO  Images have been saved and stored to PACS system. Images obtained on: GE S7 Ultrasound machine    ULTRASOUND FINDINGS:  Significant calcific change with thickening and hypoechoic change within the insertion of the pectoralis major tendon Small amount of halo sign around the bicipital groove and biceps tendon.  Subscap, supraspinatus and infraspinatus/teres minor are intact.  Mild AC joint arthropathy with no pain with sono palpation.  DESCRIPTION OF PROCEDURE:  The patient's clinical condition is marked by substantial pain and/or significant functional disability. Other conservative therapy has not provided relief, is contraindicated, or not appropriate. There is a reasonable likelihood that injection will significantly improve the patient's pain and/or functional impairment.   After discussing the risks, benefits and expected outcomes of the injection and all questions were reviewed and answered, the patient wished to undergo the above named procedure.  Verbal consent was obtained.  The ultrasound was used to identify the target structure and adjacent neurovascular structures. The skin was then prepped in sterile fashion and the target structure was injected under direct visualization using sterile technique as below:  Single injection performed as below: PREP: Alcohol and Ethel Chloride APPROACH:direct, single injection, 22g 1.5 in. INJECTATE: 1 cc 0.5% Marcaine and 1 cc 40mg /mL DepoMedrol ASPIRATE: None DRESSING: Band-Aid  Post procedural instructions including recommending icing and warning signs for infection were reviewed.    This procedure was well tolerated and there were no complications.   IMPRESSION: Succesful Ultrasound Guided: Injection

## 2018-06-15 NOTE — Patient Instructions (Signed)

## 2018-06-15 NOTE — Patient Instructions (Addendum)
Health Maintenance Due  Topic Date Due  . I would also like for you to sign up for an annual wellness visit with one of our nurses, Cassie or Manuela Schwartz, who both specialize in the annual wellness visit. This is a free benefit under medicare that may help [place] find additional ways to help you. Some highlights are reviewing medications, lifestyle, and doing a dementia screen.   . Please check with your pharmacy to see if they have the shingrix vaccine. If they do- please get this immunization and update [place] by phone call or mychart with dates you receive the vaccine   . TETANUS/TDAP  06/10/2017   I am willing to trial rosuvastatin instead of atorvastatin but I think your issues are probably related to the fall as well as baseline rotator cuff issues  I placed referral for Dr. Paulla Fore. Go out to the lobby and schedule at his 2: 20 slot. Please stick around.

## 2018-06-15 NOTE — Progress Notes (Signed)
Brian Graham. Brian Graham, [place] at [facility] [phone]  Brian Graham - 67 y.o. male MRN [id number]  Date of birth: 1951-08-18  Visit Date: 06/15/2018  PCP: [name], MD   Referred by: [name], MD  Scribe(s) for today's visit: Wendy Poet, LAT, ATC  SUBJECTIVE:  Brian Graham is here for New Patient (Initial Visit) (L shoulder pain) .  Referred by: Dr. Garret Reddish  His L shoulder pain symptoms INITIALLY: [place] down some stairs a couple weeks ago and he feels he may have tried to brace himself w/ his arms which likely led to his current L shoulder pain. Described as severe, sharp and burning pain, nonradiating Worsened with shoulder ROM above 90 deg (aBd >flex); IR behind the back; sleeping on his side Improved with Aleve, ice Additional associated symptoms include: mechanical symptoms noted in the L shoulder; no N/T noted in L UE    At this time symptoms are worsening compared to onset. He has been taking Aleve and using ice.  He has also been doing some home exercises prescribed by Dr. [name] but doesn't notice any improvement.   REVIEW OF SYSTEMS: Reports night time disturbances. Denies fevers, chills, or night sweats. Denies unexplained weight loss. Denies personal history of cancer. Reports changes in bowel or bladder habits.  Notes constipation since L kidney removed in Nov 2018. Reports recent unreported falls.  [place] down 4 stairs in early July 2019 Denies new or worsening dyspnea or wheezing. Denies headaches or dizziness.  Denies numbness, tingling or weakness  In the extremities.  Denies dizziness or presyncopal episodes Reports lower extremity edema - in the R knee   HISTORY & PERTINENT PRIOR DATA:  Prior History reviewed and updated per electronic medical record.  Significant/pertinent history, findings, studies include:  reports that he quit smoking about 11 months ago. His smoking  use included cigarettes. He has a 45.00 pack-year smoking history. He has never used smokeless tobacco. No results for input(s): HGBA1C, LABURIC, CREATINE in the last 8760 hours. No specialty comments available. No problems updated.  OBJECTIVE:  VS:  HT:5\' 9"  (175.3 cm)   WT:154 lb (69.9 kg)  BMI:22.73    BP:128/62  HR:87bpm  TEMP: ( )  RESP:97 %   PHYSICAL EXAM: Constitutional: WDWN, Non-toxic appearing. Psychiatric: Alert & appropriately interactive.  Not depressed or anxious appearing. Respiratory: No increased work of breathing.  Trachea Midline Eyes: Pupils are equal.  EOM intact without nystagmus.  No scleral icterus  Vascular Exam: warm to touch no edema  upper extremity neuro exam: unremarkable normal strength normal sensation  MSK Exam: Left shoulder is overall well aligned.  He has pain across the anterior shoulder most notably at the insertion of the pec tendon.  Small amount of pain over the proximal biceps tendon.  Pain is worse with external rotation in any plane and is exacerbated with overhead reaching and mainly a abductor position.  Rotation is to the lateral hip only due to pain localizing to the anterior shoulder.  Internal rotation, external rotation and empty can strength testing is normal.  Pain is worse with speeds testing.  Less painful with O'Brien's testing.   ASSESSMENT & PLAN:   1. Left shoulder pain, unspecified chronicity   2. Chronic left shoulder pain   3. Calcific tendonitis of left shoulder     PLAN: Calcific tendinitis of the left pectoralis major tendon with associated changes on x-ray that correlate with ultrasound  findings.  This was injected under direct visualization with ultrasound and provided good improvement in his symptoms almost immediately.  We will plan to check in with him in 2 weeks to ensure his range of motion is improving.  Follow-up: Return in about 2 weeks (around 06/29/2018).      Please see additional documentation  for Objective, Assessment and Plan sections. Pertinent additional documentation may be included in corresponding procedure notes, imaging studies, problem based documentation and patient instructions. Please see these sections of the encounter for additional information regarding this visit.  CMA/ATC served as Education administrator during this visit. History, Physical, and Plan performed by medical provider. Documentation and orders reviewed and attested to.      Gerda Diss, [place] [facility] Physician

## 2018-06-15 NOTE — Assessment & Plan Note (Signed)
Patient is concerned that his pains are coming from statin therapy with atorvastatin due to some reading he has done online about atorvastatin. I told him more likely related to his recent fall and underlying arthritic issues. He would still like to trial rosuvastatin 40mg - I sent this in for him. Will update Dr. Percival Spanish as well

## 2018-06-19 ENCOUNTER — Other Ambulatory Visit (INDEPENDENT_AMBULATORY_CARE_PROVIDER_SITE_OTHER): Payer: Medicare Other

## 2018-06-19 DIAGNOSIS — R937 Abnormal findings on diagnostic imaging of other parts of musculoskeletal system: Secondary | ICD-10-CM

## 2018-06-19 DIAGNOSIS — M85822 Other specified disorders of bone density and structure, left upper arm: Secondary | ICD-10-CM | POA: Diagnosis not present

## 2018-06-20 ENCOUNTER — Other Ambulatory Visit: Payer: Medicare Other

## 2018-06-20 DIAGNOSIS — R937 Abnormal findings on diagnostic imaging of other parts of musculoskeletal system: Secondary | ICD-10-CM

## 2018-06-20 NOTE — Addendum Note (Signed)
Addended by: Kayren Eaves T on: 06/20/2018 01:31 PM   Modules accepted: Orders

## 2018-06-21 ENCOUNTER — Ambulatory Visit (INDEPENDENT_AMBULATORY_CARE_PROVIDER_SITE_OTHER)
Admission: RE | Admit: 2018-06-21 | Discharge: 2018-06-21 | Disposition: A | Discharge status: Home / Self Care | Payer: Medicare Other | Source: Ambulatory Visit | Attending: Family Medicine | Admitting: Family Medicine

## 2018-06-21 DIAGNOSIS — R937 Abnormal findings on diagnostic imaging of other parts of musculoskeletal system: Secondary | ICD-10-CM

## 2018-06-21 DIAGNOSIS — M85822 Other specified disorders of bone density and structure, left upper arm: Secondary | ICD-10-CM | POA: Diagnosis not present

## 2018-06-21 LAB — PROTEIN ELECTROPHORESIS, SERUM
ALPHA 2: 0.8 g/dL (ref 0.5–0.9)
Albumin ELP: 4.2 g/dL (ref 3.8–4.8)
Alpha 1: 0.3 g/dL (ref 0.2–0.3)
Beta 2: 0.3 g/dL (ref 0.2–0.5)
Beta Globulin: 0.5 g/dL (ref 0.4–0.6)
Gamma Globulin: 0.9 g/dL (ref 0.8–1.7)
TOTAL PROTEIN: 7 g/dL (ref 6.1–8.1)

## 2018-06-21 LAB — KAPPA/LAMBDA LIGHT CHAINS, FREE, WITH RATIO, 24HR. URINE

## 2018-06-22 LAB — TIQ-NTM

## 2018-06-26 ENCOUNTER — Encounter: Payer: Self-pay | Admitting: Family Medicine

## 2018-06-26 DIAGNOSIS — M810 Age-related osteoporosis without current pathological fracture: Secondary | ICD-10-CM | POA: Insufficient documentation

## 2018-06-29 LAB — TEST AUTHORIZATION

## 2018-06-29 LAB — KAPPA/LAMBDA LIGHT CHAINS

## 2018-06-29 LAB — KAPPA/LAMBDA LIGHT CHAINS, FREE, WITH RATIO, 24HR. URINE
KAPPA Ligh Chain, Free U: 85.9 mg/L — ABNORMAL HIGH (ref 1.35–24.19)
KAPPA/LAMBDA Light Chains Free w/Ratio,Ur: 14.53 — ABNORMAL HIGH (ref 2.04–10.37)
Lambda Light Chain, Free U: 5.91 mg/L (ref 0.24–6.66)

## 2018-07-03 ENCOUNTER — Ambulatory Visit: Payer: Medicare Other | Admitting: Sports Medicine

## 2018-07-03 ENCOUNTER — Encounter: Payer: Self-pay | Admitting: Sports Medicine

## 2018-07-03 VITALS — BP 130/74 | HR 66 | Ht 69.0 in | Wt 151.8 lb

## 2018-07-03 DIAGNOSIS — M7532 Calcific tendinitis of left shoulder: Secondary | ICD-10-CM | POA: Diagnosis not present

## 2018-07-03 DIAGNOSIS — M25512 Pain in left shoulder: Secondary | ICD-10-CM

## 2018-07-03 DIAGNOSIS — R937 Abnormal findings on diagnostic imaging of other parts of musculoskeletal system: Secondary | ICD-10-CM

## 2018-07-03 DIAGNOSIS — M810 Age-related osteoporosis without current pathological fracture: Secondary | ICD-10-CM

## 2018-07-03 DIAGNOSIS — M85822 Other specified disorders of bone density and structure, left upper arm: Secondary | ICD-10-CM | POA: Diagnosis not present

## 2018-07-03 DIAGNOSIS — G8929 Other chronic pain: Secondary | ICD-10-CM

## 2018-07-03 NOTE — Progress Notes (Signed)
Brian Graham. [name], Brian Graham at [place]  Brian Graham - 67 y.o. male MRN [id number]  Date of birth: Mar 29, 1951  Visit Date: 07/03/2018  PCP: [name], MD   Referred by: [name], MD  Scribe(s) for today's visit: [name], CMA  SUBJECTIVE:  Brian Graham is here for Follow-up (L shoulder)   06/15/2018: His L shoulder pain symptoms INITIALLY: [place] down some stairs a couple weeks ago and he feels he may have tried to brace himself w/ his arms which likely led to his current L shoulder pain. Described as severe, sharp and burning pain, nonradiating Worsened with shoulder ROM above 90 deg (aBd >flex); IR behind the back; sleeping on his side Improved with Aleve, ice Additional associated symptoms include: mechanical symptoms noted in the L shoulder; no N/T noted in L UE   At this time symptoms are worsening compared to onset. He has been taking Aleve and using ice.  He has also been doing some home exercises prescribed by Dr. [name] but doesn't notice any improvement.  07/03/2018: Compared to the last office visit, his previously described symptoms show no change, a little better but not significantly. He stiff has difficulty raising arm overhead and reaching behind him. Pain is mostly posterior. He has noticed clicking and popping and reports that "it kind of feels good when it pops".  Current symptoms are moderate-severe & are nonradiating He has been taking Aleve and icing prn with minimal relief.  Received steroid injection 06/15/2018 and tolerated well. He reports some improvement with pain but still has decreased ROM. He has been taking it easy, if he "does something stupid" he can feel it, "it burns".     REVIEW OF SYSTEMS: Reports night time disturbances, improving. Denies fevers, chills, or night sweats. Denies unexplained weight loss. Denies personal history of cancer. Denies  changes in bowel or bladder habits. Denies recent unreported falls. Denies new or worsening dyspnea or wheezing. Denies headaches or dizziness.  Denies numbness, tingling or weakness  In the extremities.  Denies dizziness or presyncopal episodes Denies lower extremity edema    HISTORY & PERTINENT PRIOR DATA:  Prior History reviewed and updated per electronic medical record.  Significant/pertinent history, findings, studies include:  reports that he quit smoking about a year ago. His smoking use included cigarettes. He has a 45.00 pack-year smoking history. He has never used smokeless tobacco. No results for input(s): HGBA1C, LABURIC, CREATINE in the last 8760 hours. No specialty comments available. No problems updated.  OBJECTIVE:  VS:  HT:5\' 9"  (175.3 cm)   WT:151 lb 12.8 oz (68.9 kg)  BMI:22.41    BP:130/74  HR:66bpm  TEMP: ( )  RESP:94 %   PHYSICAL EXAM: Constitutional: WDWN, Non-toxic appearing. Psychiatric: Alert & appropriately interactive.  Not depressed or anxious appearing. Respiratory: No increased work of breathing.  Trachea Midline Eyes: Pupils are equal.  EOM intact without nystagmus.  No scleral icterus  Vascular Exam: warm to touch no edema  upper extremity neuro exam: unremarkable  Left Shoulder: . Well aligned, no significant deformity. . No overlying skin changes. . TTP over Anterior shoulder at the pec insertion. . Markedly improved internal and external rotation, overhead range of motion has markedly improved as well much less pain.  Strength is intact to internal rotation, external rotation, empty can testing . Some persistent pain with external rotation of the shoulder localizing to the anterior shoulder at the origin of  the biceps.  No significant crepitation or pain with axial load and circumduction.   PROCEDURES & DATA REVIEWED:  PROCEDURE NOTE: THERAPEUTIC EXERCISES (97110) 15 minutes spent for Therapeutic exercises as below and as referenced  in the AVS.  This included exercises focusing on stretching, strengthening, with significant focus on eccentric aspects.   Proper technique shown and discussed handout in great detail with ATC.  All questions were discussed and answered.   Long term goals include an improvement in range of motion, strength, endurance as well as avoiding reinjury. Frequency of visits is one time as determined during today's  office visit. Frequency of exercises to be performed is as per handout.  EXERCISES REVIEWED:  Intrinsic Rotator Cuff Exercises  Scapular Stabilization  Pec stretching  ASSESSMENT & PLAN:   1. Left shoulder pain, unspecified chronicity   2. Age-related osteoporosis without current pathological fracture   3. Chronic left shoulder pain   4. Calcific tendonitis of left shoulder   5. Osteopenia of left upper arm   6. Abnormal bone xray     PLAN: He is doing significantly better although continues to have pain but for a 2 weeks he has had good progress.  His blood work and bone density test were reassuring for nonpathologic issues although he does have osteoporosis that will be discussed with Dr. [name] at his follow-up.  I would like to check in with him in 4 weeks if persistent symptoms at that time we will obtain an MRI of the shoulder but I suspect over the next 4 weeks with the addition of therapeutic exercises and improved range of motion of the shoulder his pain will be significantly improved.  Discussed the foundation of treatment for this condition is physical therapy and/or daily (5-6 days/week) therapeutic exercises, focusing on core strengthening, coordination, neuromuscular control/reeducation.  Therapeutic exercises prescribed per procedure note.   Follow-up: Return in about 4 weeks (around 07/31/2018).      Please see additional documentation for Objective, Assessment and Plan sections. Pertinent additional documentation may be included in corresponding procedure notes,  imaging studies, problem based documentation and patient instructions. Please see these sections of the encounter for additional information regarding this visit.  CMA/ATC served as Education administrator during this visit. History, Physical, and Plan performed by medical provider. Documentation and orders reviewed and attested to.      Gerda Diss, [facility] Physician

## 2018-07-03 NOTE — Patient Instructions (Signed)
Please perform the exercise program that we have prepared for you and gone over in detail on a daily basis.  In addition to the handout you were provided you can access your program through: [email or web address]   Your unique program code is: [email or web address]

## 2018-07-08 ENCOUNTER — Encounter: Payer: Self-pay | Admitting: Sports Medicine

## 2018-07-12 ENCOUNTER — Other Ambulatory Visit: Payer: Self-pay | Admitting: Radiology

## 2018-07-12 DIAGNOSIS — R937 Abnormal findings on diagnostic imaging of other parts of musculoskeletal system: Secondary | ICD-10-CM

## 2018-07-18 ENCOUNTER — Encounter: Payer: Self-pay | Admitting: Family Medicine

## 2018-07-18 ENCOUNTER — Ambulatory Visit: Payer: Medicare Other | Admitting: Family Medicine

## 2018-07-18 ENCOUNTER — Other Ambulatory Visit: Payer: Self-pay

## 2018-07-18 VITALS — BP 132/80 | HR 61 | Temp 97.6°F | Ht 69.0 in | Wt 154.4 lb

## 2018-07-18 DIAGNOSIS — M25512 Pain in left shoulder: Secondary | ICD-10-CM

## 2018-07-18 DIAGNOSIS — R937 Abnormal findings on diagnostic imaging of other parts of musculoskeletal system: Secondary | ICD-10-CM | POA: Diagnosis not present

## 2018-07-18 DIAGNOSIS — M810 Age-related osteoporosis without current pathological fracture: Secondary | ICD-10-CM | POA: Diagnosis not present

## 2018-07-18 DIAGNOSIS — G8929 Other chronic pain: Secondary | ICD-10-CM

## 2018-07-18 MED ORDER — TRAMADOL HCL 50 MG PO TABS: 50.0000 mg | ORAL_TABLET | Freq: Four times a day (QID) | ORAL | 0 refills | Status: DC | PRN

## 2018-07-18 MED ORDER — ALENDRONATE SODIUM 70 MG PO TABS: 70.0000 mg | ORAL_TABLET | ORAL | 3 refills | Status: DC

## 2018-07-18 NOTE — Progress Notes (Signed)
Subjective:  Brian Graham is a 67 y.o. year old very pleasant male patient who presents for/with See problem oriented charting ROS- continued left shoulder pain. No fever or chills. No chest pain.    Past Medical History-  Patient Active Problem List   Diagnosis Date Noted  . Osteoporosis 06/26/2018    Priority: High  . Atherosclerosis of native arteries of extremity with intermittent claudication ([place]) 07/06/2017    Priority: High  . Weight loss 04/23/2017    Priority: High  . Former smoker 09/01/2016    Priority: High  . COPD, severe ([place]) 08/23/2016    Priority: High  . Hx of CABG 08/05/2016    Priority: High  . CAD (coronary artery disease) of artery bypass graft 08/05/2016    Priority: High  . History of adenomatous polyp of colon 04/22/2017    Priority: Medium  . Hyperlipidemia 04/04/2017    Priority: Medium  . Hypothyroidism (acquired) 04/04/2017    Priority: Medium  . Essential hypertension 08/05/2016    Priority: Medium  . Essential tremor 06/11/2016    Priority: Medium  . Aortic atherosclerosis ([place]) 04/13/2017    Priority: Low  . Seasonal allergies 04/04/2017    Priority: Low  . Myalgia 10/04/2016    Priority: Low  . Dysuria 08/23/2016    Priority: Low  . Chronic pain of both knees 08/23/2016    Priority: Low  . Tension headache 06/11/2016    Priority: Low  . Perennial allergic rhinitis 01/24/2018  . 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 Urinary tract infection 09/03/2017  . Constipation 09/03/2017  . Anxiety 07/24/2017  . Pyelonephritis 04/23/2017    Medications- reviewed and updated Current Outpatient Medications  Medication Sig Dispense Refill  . acetaminophen (TYLENOL) 500 MG tablet Take 1,000 mg by mouth every 6 (six) hours as needed for mild pain.    Marland Kitchen albuterol (PROVENTIL HFA;VENTOLIN HFA) 108 (90 Base) MCG/ACT inhaler Inhale 2 puffs into the lungs every 6 (six) hours as needed for wheezing or  shortness of breath. 1 Inhaler 5  . albuterol (PROVENTIL) (2.5 MG/3ML) 0.083% nebulizer solution Take 3 mLs (2.5 mg total) by nebulization every 6 (six) hours as needed for wheezing or shortness of breath. DX: J44.9 300 mL 11  . amLODipine (NORVASC) 5 MG tablet TAKE 1 TABLET DAILY 90 tablet 1  . atenolol (TENORMIN) 50 MG tablet Take 1 tablet (50 mg total) by mouth daily. 90 tablet 3  . azelastine (ASTELIN) 0.1 % nasal spray Place 2 sprays into both nostrils 2 (two) times daily. Use in each nostril as directed 30 mL 5  . benazepril (LOTENSIN) 20 MG tablet TAKE 1 TABLET DAILY 90 tablet 0  . Misc. Devices (PULSE OXIMETER) MISC 1 application by Does not apply route as needed. 1 each 0  . ondansetron (ZOFRAN-ODT) 4 MG disintegrating tablet PLACE 1 TO 2 TABLETS ON TONGUE AND ALLOW TO DISSOLVE EVERY 8 HOURS AS NEEDED FOR NAUSEA / VOMITING 30 tablet 0  . rosuvastatin (CRESTOR) 40 MG tablet Take 1 tablet (40 mg total) by mouth daily. 90 tablet 3  . SPIRIVA RESPIMAT 2.5 MCG/ACT AERS USE 2 INHALATIONS DAILY 12 g 3  . SYMBICORT 160-4.5 MCG/ACT inhaler USE 2 INHALATIONS TWICE A DAY 30.6 g 1  . SYNTHROID 125 MCG tablet Take 1 tablet (125 mcg total) by mouth daily before breakfast. 90 tablet 3   No current facility-administered medications for this visit.     Objective: BP 132/80 (BP  Location: Left Arm, Patient Position: Sitting, Cuff Size: Normal)   Pulse 61   Temp 97.6 F (36.4 C) (Oral)   Ht 5\' 9"  (1.753 m)   Wt 154 lb 6.4 oz (70 kg)   SpO2 97%   BMI 22.80 kg/m  Gen: NAD, resting comfortably  Assessment/Plan:  Left shoulder pain- discussed with Dr. Nicolasa Ducking team- they have ordered an MRI for patient and will follow up with him after the MRI in office.  We are doing this due to his failure to improve further on the steroid injection.  He has been taking Aleve but with unilateral kidney I told him I wanted him to avoid this-therefore gave him some tramadol to use sparingly.  NCCSRS was reviewed.  Had  previously received hydrocodone and oxycodone last year around time of surgery- no abnormal findings on review.  Osteoporosis S: osteoporosis detected on last Dexa 06/21/18 with worst T score at LFN at -3.1.  A/P: we had an extended discussion today about benefits/risks of fosamax. In particular patient concerned this could worsen his left shoulder pain which he is following up with Dr. Paulla Fore for.  We discussed that if this happened-could pull off medication.  We also went through a full list of potential adverse reactions on up-to-date.  Also counseled on how to appropriately take medication and how he should take it before his thyroid medicine once a week.  Finally I explained that we need to repeat the kappa lambda light chain analysis-I apologized that we had  ordered a urine test instead of the serum test.  He will stop by lab today  Future Appointments  Date Time Provider [place]  07/31/2018 11:00 AM Gerda Diss, DO [facility]   Lab/Order associations: Left shoulder pain, unspecified chronicity  Age-related osteoporosis without current pathological fracture  Abnormal bone xray - Plan: Kappa/lambda light chains  Meds ordered this encounter  Medications  . alendronate (FOSAMAX) 70 MG tablet    Sig: Take 1 tablet (70 mg total) by mouth every 7 (seven) days. Take with a full glass of water on an empty stomach.    Dispense:  13 tablet    Refill:  3  . traMADol (ULTRAM) 50 MG tablet    Sig: Take 1 tablet (50 mg total) by mouth every 6 (six) hours as needed for moderate pain or severe pain.    Dispense:  20 tablet    Refill:  0   Time Stamp The duration of face-to-face time during this visit was greater than 25 minutes. Greater than 50% of this time was spent in counseling, explanation of diagnosis, planning of further management, and/or coordination of care including reviewing risks and benefits of Fosamax, reviewing how to take Fosamax, reviewing need for calcium and  vitamin D, consulting with Dr. Paulla Fore team, consulting with lab.    Return precautions advised.  Garret Reddish, MD

## 2018-07-18 NOTE — Assessment & Plan Note (Signed)
S: osteoporosis detected on last Dexa 06/21/18 with worst T score at LFN at -3.1.  A/P: we had an extended discussion today about benefits/risks of fosamax. In particular patient concerned this could worsen his left shoulder pain which he is following up with Dr. Paulla Fore for.  We discussed that if this happened-could pull off medication.  We also went through a full list of potential adverse reactions on up-to-date.  Also counseled on how to appropriately take medication and how he should take it before his thyroid medicine once a week.  Finally I explained that we need to repeat the kappa lambda light chain analysis-I apologized that we had  ordered a urine test instead of the serum test.  He will stop by lab today

## 2018-07-18 NOTE — Patient Instructions (Addendum)
Health Maintenance Due  Topic Date Due  . INFLUENZA VACCINE -please call our office to schedule this in October/November 06/22/2018   Please stop by lab before you go  Please give Brian Graham a calcium handout with calcium amounts in different foods   Per Dr. Paulla Fore, based on last office visit, can go ahead and have MRI.  They should be contacting you within the next two weeks to schedule your appointment for this.  Please follow up with Dr. Paulla Fore after you have had your MRI done.  If you have any questions or concerns, please contact Brandy (CMA) or Cloyde Reams, (AT) at [phone]  You have osteoporosis At a minimum, recommend 800 IU of vitamin D and 1200mg  of Calcium per day (ok to do just 600 through a pill and rest through diet). You can get this with a calcium-vitamin D supplement.   Once you have the above in place, I would start taking fosamax 70mg  once a week.  Administer first thing in the morning and >30 minutes before the first food, beverage (except plain water), or other medication of the day. Do not take with mineral water or with other beverages. Stay upright (not to lie down) for at least 30 minutes after taking medicine and until after first food of the day (to reduce irritation). Must be taken with 6 to 8 oz of plain water. The tablet should be swallowed whole; do not chew or suck.

## 2018-07-19 LAB — KAPPA/LAMBDA LIGHT CHAINS
KAPPA FREE LGHT CHN: 39.5 mg/L — AB (ref 3.3–19.4)
Kappa:Lambda Ratio: 1.85 — ABNORMAL HIGH (ref 0.26–1.65)
Lambda Free Lght Chn: 21.3 mg/L (ref 5.7–26.3)

## 2018-07-21 ENCOUNTER — Other Ambulatory Visit: Payer: Self-pay

## 2018-07-21 DIAGNOSIS — R937 Abnormal findings on diagnostic imaging of other parts of musculoskeletal system: Secondary | ICD-10-CM

## 2018-07-21 NOTE — Progress Notes (Signed)
Patient already notified of abnormal bone xray results.  Urgent referral put in for Oncology under diagnosis of abnormal bone xray and elevated kappa lamda ratio.

## 2018-07-25 ENCOUNTER — Telehealth: Payer: Self-pay | Admitting: Hematology and Oncology

## 2018-07-25 ENCOUNTER — Encounter: Payer: Self-pay | Admitting: Hematology and Oncology

## 2018-07-25 NOTE — Telephone Encounter (Signed)
New referral received from Dr. [name] for elevated kappa light chain ratio. Pt has been called and scheduled to see Dr. Lindi Adie on 9/11 at 345pm. Pt aware to arrive 30 minutes early. Letter mailed.

## 2018-07-26 ENCOUNTER — Other Ambulatory Visit: Payer: Self-pay | Admitting: Family Medicine

## 2018-07-31 ENCOUNTER — Ambulatory Visit: Payer: Medicare Other | Admitting: Sports Medicine

## 2018-07-31 IMAGING — CT CT RENAL STONE PROTOCOL
2 of 4 series · 16 of 46 positions shown, 18 images · non-contrast
Comparison: 04/15/2017

CLINICAL DATA: Left-sided flank pain. Hydronephrosis. Previous
history of kidney infections. History of congenital left pelvic
kidney.

EXAM:
CT ABDOMEN AND PELVIS WITHOUT CONTRAST
TECHNIQUE: Multidetector CT imaging of the abdomen and pelvis was performed
following the standard protocol without IV contrast.

[Series 2: stone full standard · axial · 0.70mm/px · z∈[-1019,-654]mm · 13 of 81 slices shown, 15 images]
[im 4/81  soft-tissue]
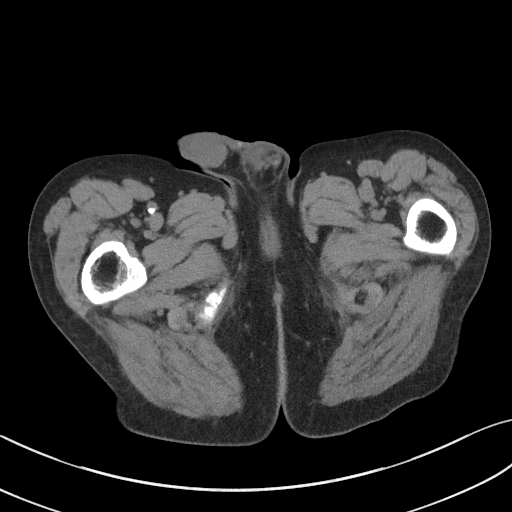
[im 4/81  bone]
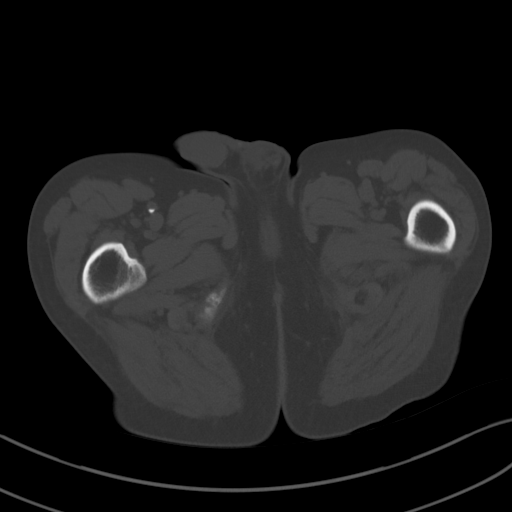
[im 11/81  soft-tissue]
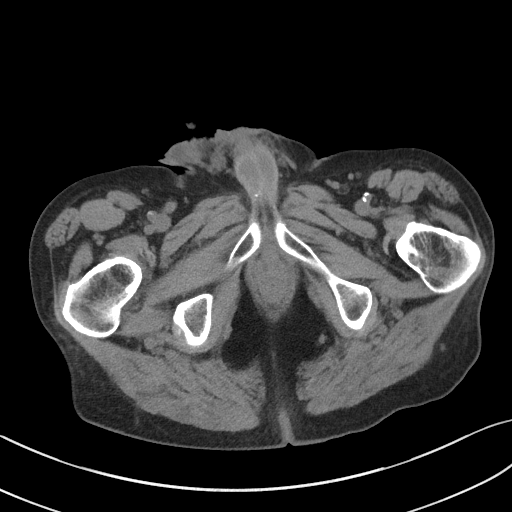
[im 17/81  soft-tissue]
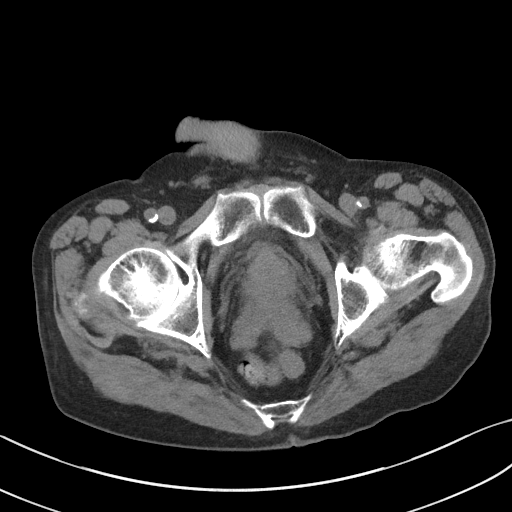
[im 24/81  soft-tissue]
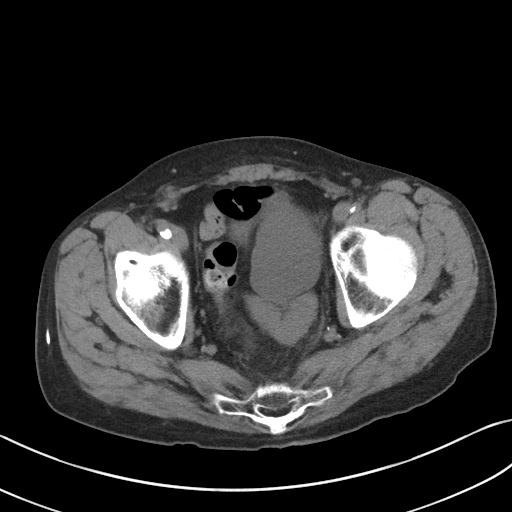
[im 27/81  soft-tissue]
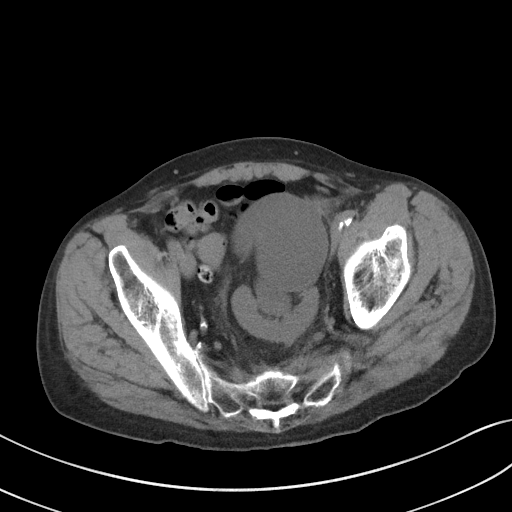
[im 34/81  soft-tissue]
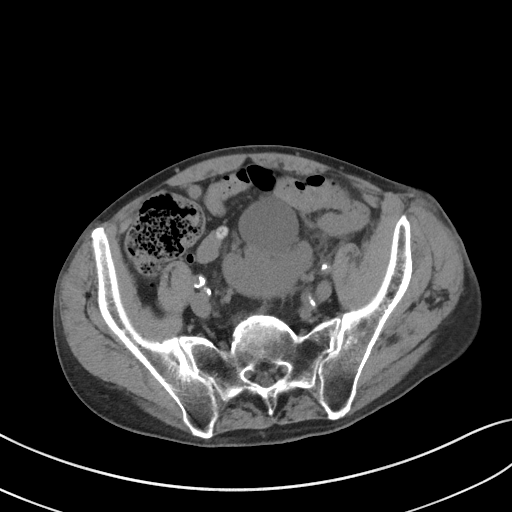
[im 41/81  soft-tissue]
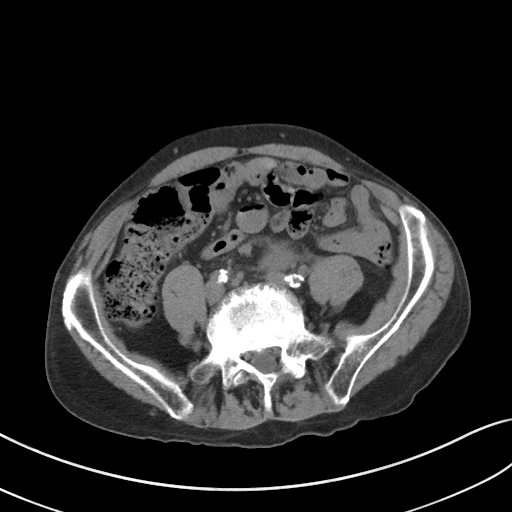
[im 47/81  soft-tissue]
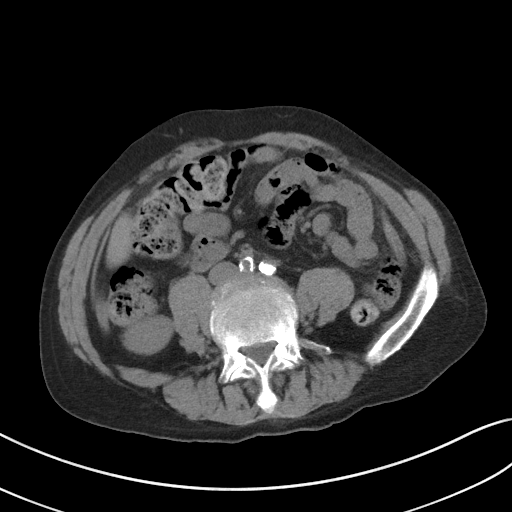
[im 54/81  soft-tissue]
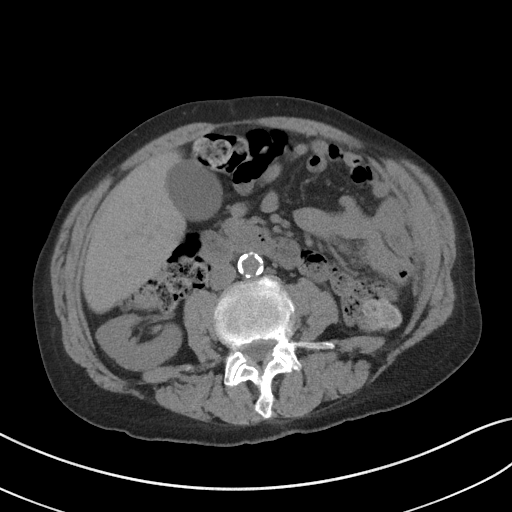
[im 54/81  bone]
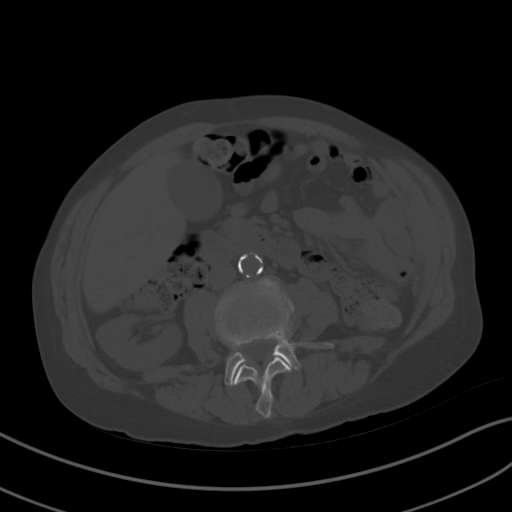
[im 57/81  soft-tissue]
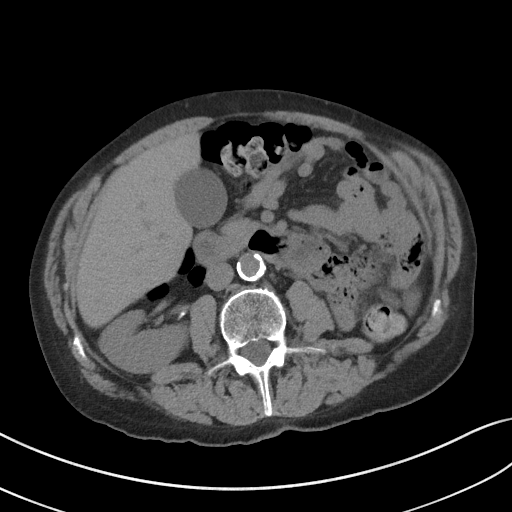
[im 64/81  soft-tissue]
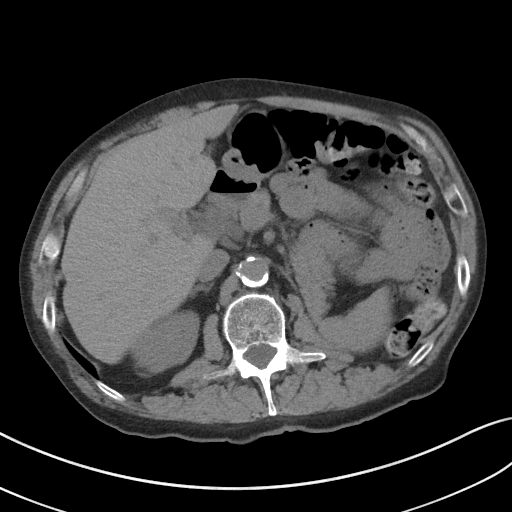
[im 71/81  soft-tissue]
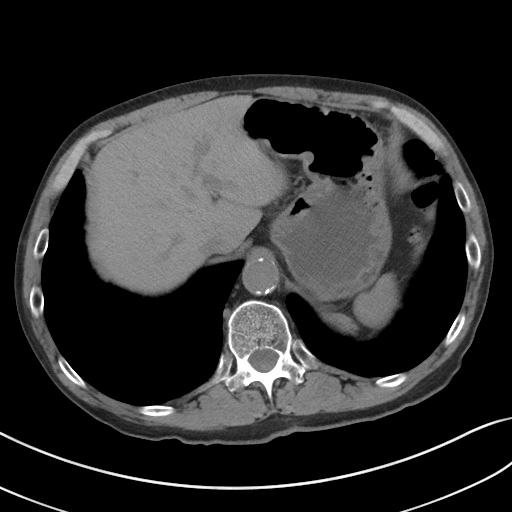
[im 77/81  soft-tissue]
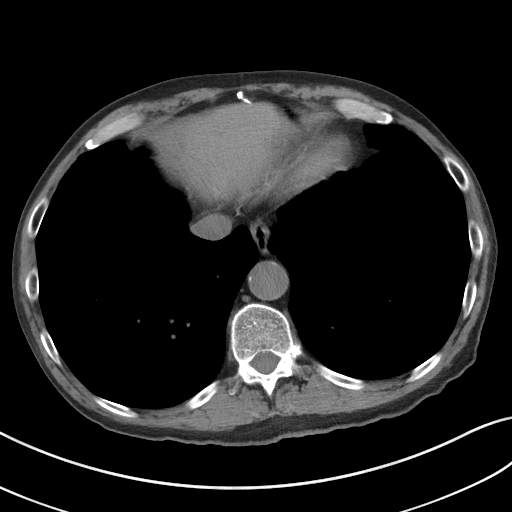

[Series 5: coronal · coronal · 0.70mm/px · 3 of 122 slices shown]
[im 41/122  soft-tissue]
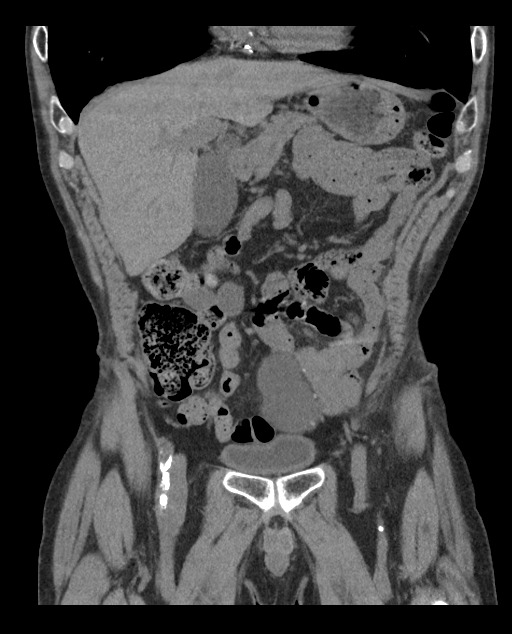
[im 54/122  soft-tissue]
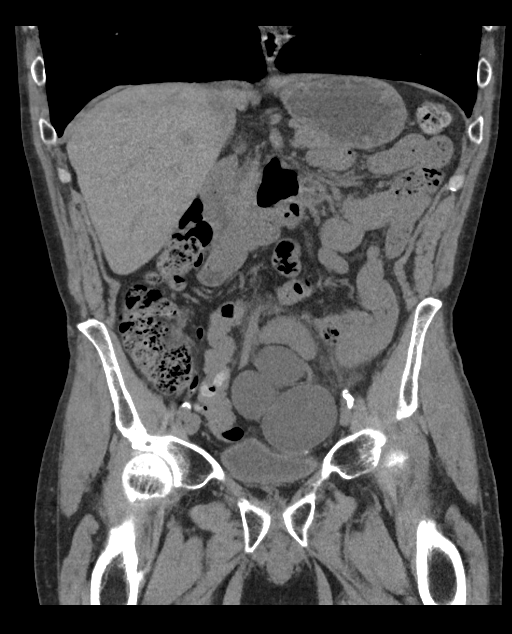
[im 68/122  soft-tissue]
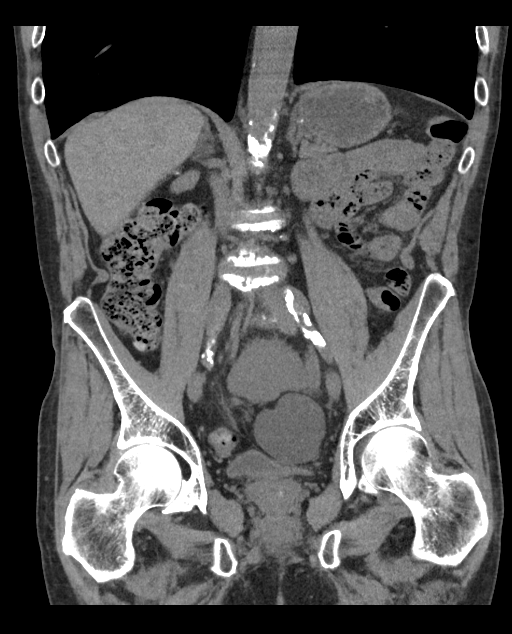

[16 of 46 positions shown; findings below may reference images not displayed]

FINDINGS: Lower chest: The lung bases are clear. Coronary artery
calcifications.

Hepatobiliary: No focal liver abnormality is seen. No gallstones,
gallbladder wall thickening, or biliary dilatation.

Pancreas: Unremarkable. No pancreatic ductal dilatation or
surrounding inflammatory changes.

Spleen: Normal in size without focal abnormality.

Adrenals/Urinary Tract: No adrenal gland nodules. Right kidney
appears normal. No evidence of hydronephrosis or hydroureter. Left
kidney is located in the pelvis. There is prominent hydronephrosis
of the left kidney. No obstructing stones are demonstrated in the
appearance is unchanged since previous study suggesting probable
chronic left ureteropelvic junction obstruction. Bladder wall is
mildly thickened, likely due to decompression. Can't exclude
cystitis. No intraluminal filling defects or gas demonstrated.

Stomach/Bowel: Stomach, small bowel, and colon are not abnormally
distended. Scattered stool throughout the colon. No inflammatory
changes. Appendix is normal.

Vascular/Lymphatic: Aortic atherosclerosis. No enlarged abdominal or
pelvic lymph nodes.

Reproductive: Prostate is unremarkable.

Other: No free air or free fluid in the abdomen. Abdominal wall
musculature appears intact.

Musculoskeletal: Degenerative changes in the spine. No destructive
bone lesions. Surgical clips in the right groin region.
IMPRESSION: 1. Left pelvic kidney with severe hydronephrosis similar to previous
study. Probable chronic ureteropelvic junction obstruction. Right
kidney is normal.
2. Bladder wall thickening is likely due to under distention but
cystitis not excluded. Correlate with urinalysis.
3. Aortic atherosclerosis.

## 2018-08-02 ENCOUNTER — Inpatient Hospital Stay: Payer: Medicare Other

## 2018-08-02 ENCOUNTER — Inpatient Hospital Stay: Payer: Medicare Other | Attending: Hematology and Oncology | Admitting: Hematology and Oncology

## 2018-08-02 ENCOUNTER — Telehealth: Payer: Self-pay | Admitting: Hematology and Oncology

## 2018-08-02 VITALS — BP 120/75 | HR 73 | Temp 98.5°F | Resp 17 | Ht 69.0 in | Wt 155.5 lb

## 2018-08-02 DIAGNOSIS — Z87891 Personal history of nicotine dependence: Secondary | ICD-10-CM | POA: Diagnosis not present

## 2018-08-02 DIAGNOSIS — D472 Monoclonal gammopathy: Secondary | ICD-10-CM

## 2018-08-02 DIAGNOSIS — M85812 Other specified disorders of bone density and structure, left shoulder: Secondary | ICD-10-CM | POA: Diagnosis not present

## 2018-08-02 NOTE — Progress Notes (Signed)
[facility] NOTE  Patient Care Team: [name], MD as PCP - General (Family Medicine) Juanito Doom, MD as Consulting Physician (Pulmonary Disease) Minus Breeding, MD as Consulting Physician (Cardiology)  CHIEF COMPLAINTS/PURPOSE OF CONSULTATION:  Elevated kappa light chains  HISTORY OF PRESENTING ILLNESS:  Brian Graham 67 y.o. male is here because of elevated kappa light chains.  Patient had an accident at home where he fell downstairs and hurt his left shoulder.  This was 2 months ago.  X-ray of his shoulder on 06/15/2018 revealed no acute process or degenerative change but there was severe osteopenia and given the somewhat permeative appearance recommended correlation for myeloma.  This led to blood work that did not show any M protein but did reveal elevated kappa light chain.  K:L ratio was abnormal.  This led to the referral to see [place].  I reviewed his records extensively and collaborated the history with the patient.  MEDICAL HISTORY:  Past Medical History:  Diagnosis Date  . Anxiety   . Arthritis    "left knee" (08/05/2016)  . Bruises easily   . CAD (coronary artery disease) of artery bypass graft 08/05/2016   Around age 25. CABG - 3 vessel.   . Chronic kidney disease   . Colon polyp   . COPD, severe ([place]) 08/23/2016   Alpha 1 studies normal per prior pulmonary group notes Smoked 40 years, quit in 2012 June 2016 PFT from prior pulmonary group: "significant obstruction" FEV1 1.58L (47% pred), Residual volume 171% pred, DLCO 59% pred Simple Spirometry>> 09/15/2016 ratio 42% FEV1 1.02 L / 31%   . Essential tremor 06/11/2016   Plans to see Dr. Carles Collet  . Former smoker 09/01/2016   Quit 2012. 40 pack years at least.   . GERD (gastroesophageal reflux disease)   . Headache   . History of blood transfusion 08/1997   "when he had his heart surgery"  . History of shingles 1970-2013 X 3  . HTN (hypertension) 08/05/2016   Amlodipine 46m, atenolol  573mBID, benazepril 2020m. Hyperlipemia   . Hypothyroidism   . Lumbar disc disease   . [place]  . Peripheral vascular disease ([facility] . PONV (postoperative nausea and vomiting)   . Urinary tract infection 09/03/2017  . Wears glasses     SURGICAL HISTORY: Past Surgical History:  Procedure Laterality Date  . AORTOGRAM Right 08/31/2017   Procedure: AORTOGRAM BILATERAL PELVIC ANGIOGRAM WITH LEFT ILIAC ARTERY STENT;  Surgeon: CheConrad BurlingtonD;  [facility]  Laterality: Right;  . BACK SURGERY    . CARDIAC CATHETERIZATION  08/1997  . CORONARY ARTERY BYPASS GRAFT  09/12/1997   "triple"  . INGUINAL HERNIA REPAIR Right 1961  . KNEE RECONSTRUCTION Left 1960s - 1974 X 4  . LAPAROSCOPIC ABLATION RENAL MASS  ~ 2014   "large abscess/pus ball"  . [facility]"they were wedged in the bowel"  . PATELLA FRACTURE SURGERY Left 1963  . PATELLA FRACTURE SURGERY Left ~ 1965   "removed knee cap"  . ROBOT ASSISTED LAPAROSCOPIC NEPHRECTOMY Left 10/07/2017   Procedure: XI ROBOTIC ASSISTED LAPAROSCOPIC NEPHRECTOMY OF PELVIC KIDNEY;  Surgeon: ManAlexis FrockD;  Location: [facility];  Service: Urology;  Laterality: Left;  Place robot patient tower at foot of bed like prostate per Dr. ManTresa Mooreull extra staple loads.  . TONSILLECTOMY      SOCIAL HISTORY: Social History  Socioeconomic History  . Marital status: Married    Spouse name: Not on file  . Number of children: Not on file  . Years of education: Not on file  . Highest education level: Not on file  Occupational History  . Not on file  Social Needs  . Financial resource strain: Not on file  . Food insecurity:    Worry: Not on file    Inability: Not on file  . Transportation needs:    Medical: Not on file    Non-medical: Not on file  Tobacco Use  . Smoking status: Former Smoker    Packs/day: 1.00    Years: 45.00    Pack years: 45.00    Types: Cigarettes    Last  attempt to quit: 07/10/2017    Years since quitting: 1.0  . Smokeless tobacco: Never Used  Substance and Sexual Activity  . Alcohol use: Yes    Alcohol/week: 14.0 standard drinks    Types: 14 Shots of liquor per week    Comment: daily 2 shots of vodka  . Drug use: No  . Sexual activity: Not Currently  Lifestyle  . Physical activity:    Days per week: Not on file    Minutes per session: Not on file  . Stress: Not on file  Relationships  . Social connections:    Talks on phone: Not on file    Gets together: Not on file    Attends religious service: Not on file    Active member of club or organization: Not on file    Attends meetings of clubs or organizations: Not on file    Relationship status: Not on file  . Intimate partner violence:    Fear of current or ex partner: Not on file    Emotionally abused: Not on file    Physically abused: Not on file    Forced sexual activity: Not on file  Other Topics Concern  . Not on file  Social History Narrative   Lives with wife. 2 adopted daughters from [place].       Retired from [employer]: Family History  Problem Relation Age of Onset  . Alcohol abuse Mother   . Alcohol abuse Father        not involved in life  . Alcoholism Sister   . Healthy Sister   . Healthy Sister   . Allergic rhinitis Neg Hx   . Angioedema Neg Hx   . Asthma Neg Hx   . Eczema Neg Hx   . Immunodeficiency Neg Hx   . Urticaria Neg Hx     ALLERGIES:  is allergic to keflex [cephalexin]; sulfa antibiotics; ciprofloxacin; levaquin [levofloxacin]; and omeprazole.  MEDICATIONS:  Current Outpatient Medications  Medication Sig Dispense Refill  . acetaminophen (TYLENOL) 500 MG tablet Take 1,000 mg by mouth every 6 (six) hours as needed for mild pain.    Marland Kitchen albuterol (PROVENTIL HFA;VENTOLIN HFA) 108 (90 Base) MCG/ACT inhaler Inhale 2 puffs into the lungs every 6 (six) hours as needed for wheezing or shortness of breath. 1 Inhaler 5  . albuterol  (PROVENTIL) (2.5 MG/3ML) 0.083% nebulizer solution Take 3 mLs (2.5 mg total) by nebulization every 6 (six) hours as needed for wheezing or shortness of breath. DX: J44.9 300 mL 11  . alendronate (FOSAMAX) 70 MG tablet Take 1 tablet (70 mg total) by mouth every 7 (seven) days. Take with a full glass of water on an empty stomach. 13 tablet 3  .  amLODipine (NORVASC) 5 MG tablet TAKE 1 TABLET DAILY 90 tablet 1  . atenolol (TENORMIN) 50 MG tablet TAKE 1 TABLET DAILY 90 tablet 3  . azelastine (ASTELIN) 0.1 % nasal spray Place 2 sprays into both nostrils 2 (two) times daily. Use in each nostril as directed 30 mL 5  . benazepril (LOTENSIN) 20 MG tablet TAKE 1 TABLET DAILY 90 tablet 0  . Misc. Devices (PULSE OXIMETER) MISC 1 application by Does not apply route as needed. 1 each 0  . ondansetron (ZOFRAN-ODT) 4 MG disintegrating tablet PLACE 1 TO 2 TABLETS ON TONGUE AND ALLOW TO DISSOLVE EVERY 8 HOURS AS NEEDED FOR NAUSEA / VOMITING 30 tablet 0  . rosuvastatin (CRESTOR) 40 MG tablet Take 1 tablet (40 mg total) by mouth daily. 90 tablet 3  . SPIRIVA RESPIMAT 2.5 MCG/ACT AERS USE 2 INHALATIONS DAILY 12 g 3  . SYMBICORT 160-4.5 MCG/ACT inhaler USE 2 INHALATIONS TWICE A DAY 30.6 g 1  . SYNTHROID 125 MCG tablet Take 1 tablet (125 mcg total) by mouth daily before breakfast. 90 tablet 3  . traMADol (ULTRAM) 50 MG tablet Take 1 tablet (50 mg total) by mouth every 6 (six) hours as needed for moderate pain or severe pain. 20 tablet 0   No current facility-administered medications for this visit.     REVIEW OF SYSTEMS:   Constitutional: Denies fevers, chills or abnormal night sweats Eyes: Denies blurriness of vision, double vision or watery eyes Ears, nose, mouth, throat, and face: Denies mucositis or sore throat Respiratory: Denies cough, dyspnea or wheezes Cardiovascular: Denies palpitation, chest discomfort or lower extremity swelling Gastrointestinal:  Denies nausea, heartburn or change in bowel habits Skin:  Denies abnormal skin rashes Lymphatics: Denies new lymphadenopathy or easy bruising Neurological:Denies numbness, tingling or new weaknesses Behavioral/Psych: Mood is stable, no new changes    All other systems were reviewed with the patient and are negative.  PHYSICAL EXAMINATION: ECOG PERFORMANCE STATUS: 1 - Symptomatic but completely ambulatory  Vitals:   08/02/18 1532  BP: 120/75  Pulse: 73  Resp: 17  Temp: 98.5 F (36.9 C)  SpO2: 95%   Filed Weights   08/02/18 1532  Weight: 155 lb 8 oz (70.5 kg)    GENERAL:alert, no distress and comfortable SKIN: skin color, texture, turgor are normal, no rashes or significant lesions EYES: normal, conjunctiva are pink and non-injected, sclera clear OROPHARYNX:no exudate, no erythema and lips, buccal mucosa, and tongue normal  NECK: supple, thyroid normal size, non-tender, without nodularity LYMPH:  no palpable lymphadenopathy in the cervical, axillary or inguinal LUNGS: clear to auscultation and percussion with normal breathing effort HEART: regular rate & rhythm and no murmurs and no lower extremity edema ABDOMEN:abdomen soft, non-tender and normal bowel sounds Musculoskeletal:no cyanosis of digits and no clubbing  PSYCH: alert & oriented x 3 with fluent speech NEURO: no focal motor/sensory deficits  LABORATORY DATA:  I have reviewed the data as listed Lab Results  Component Value Date   WBC 6.0 05/22/2018   HGB 14.6 05/22/2018   HCT 43.6 05/22/2018   MCV 92.5 05/22/2018   PLT 259.0 05/22/2018   Lab Results  Component Value Date   NA 141 05/24/2018   K 4.5 05/24/2018   CL 102 05/24/2018   CO2 31 05/24/2018    RADIOGRAPHIC STUDIES: I have personally reviewed the radiological reports and agreed with the findings in the report.  ASSESSMENT AND PLAN:  MGUS (monoclonal gammopathy of unknown significance) Elevated light chains: Kappa 80, K:L ratio 14,  SPEP revealed no significant M protein but immunofixation was not yet  performed. Normal hemoglobin, creatinine, calcium X-ray of the shoulder reveal osteopenia.  No clear lytic lesions. The entire work-up was initiated because patient fell down steps and hurt his left shoulder.  X-ray of the shoulder revealed changes that were slightly concerning for myeloma although there were no lytic changes.  This led to blood work that showed elevated kappa and that prompted the referral.  Counseling: I discussed with the patient the spectrum of disorders from MGUS to multiple myeloma. We discussed the role of plasma cells in producing immunoglobulins. We discussed structure of immunoglobulins on how they make up the heavy chains and the light chains. The light chains are Kappa and lambda. I discussed the difference between MGUS and multiple myeloma. MGUS is characterized by elevation monoclonal protein without any end organ damage. Multiple myeloma is associated with elevation monoclonal protein and end organ damage (hypercalcemia, renal dysfunction, anemia, bone lytic lesions ) along with a bone marrow showing greater than 10% plasma cells.  Workup recommended: 1. serum protein electrophoresis with immunofixation 2. Bone survey  I will call him with the results of these 2 tests. My plan is to repeat blood work in 6 months and follow-up after that. If the upcoming MRI or the bone survey reveals any additional concerns for myeloma then we will have to bring him back in to perform a bone marrow biopsy.   All questions were answered. The patient knows to call the clinic with any problems, questions or concerns.    Harriette Ohara, MD 08/02/18

## 2018-08-02 NOTE — Telephone Encounter (Signed)
Gave avs and calendar ° °

## 2018-08-02 NOTE — Assessment & Plan Note (Signed)
Elevated light chains: Kappa 80, K:L ratio 14, SPEP revealed no significant M protein but immunofixation was not yet performed. Normal hemoglobin, creatinine, calcium X-ray of the shoulder reveal osteopenia.  No clear lytic lesions. The entire work-up was initiated because patient fell down steps and hurt his left shoulder.  X-ray of the shoulder revealed changes that were slightly concerning for myeloma although there were no lytic changes.  This led to blood work that showed elevated kappa and that prompted the referral.  Counseling: I discussed with the patient the spectrum of disorders from MGUS to multiple myeloma. We discussed the role of plasma cells in producing immunoglobulins. We discussed structure of immunoglobulins on how they make up the heavy chains and the light chains. The light chains are Kappa and lambda. I discussed the difference between MGUS and multiple myeloma. MGUS is characterized by elevation monoclonal protein without any end organ damage. Multiple myeloma is associated with elevation monoclonal protein and end organ damage (hypercalcemia, renal dysfunction, anemia, bone lytic lesions ) along with a bone marrow showing greater than 10% plasma cells.  Workup recommended: 1. serum protein electrophoresis with immunofixation 2. Bone survey  I will call him with the results of these 2 tests. My plan is to repeat blood work in 6 months and follow-up after that.

## 2018-08-03 LAB — MULTIPLE MYELOMA PANEL, SERUM
ALBUMIN SERPL ELPH-MCNC: 4 g/dL (ref 2.9–4.4)
ALPHA 1: 0.3 g/dL (ref 0.0–0.4)
ALPHA2 GLOB SERPL ELPH-MCNC: 0.9 g/dL (ref 0.4–1.0)
Albumin/Glob SerPl: 1.4 (ref 0.7–1.7)
B-GLOBULIN SERPL ELPH-MCNC: 0.9 g/dL (ref 0.7–1.3)
Gamma Glob SerPl Elph-Mcnc: 0.8 g/dL (ref 0.4–1.8)
Globulin, Total: 3 g/dL (ref 2.2–3.9)
IGG (IMMUNOGLOBIN G), SERUM: 862 mg/dL (ref 700–1600)
IgA: 208 mg/dL (ref 61–437)
IgM (Immunoglobulin M), Srm: 143 mg/dL (ref 20–172)
TOTAL PROTEIN ELP: 7 g/dL (ref 6.0–8.5)

## 2018-08-10 ENCOUNTER — Ambulatory Visit
Admission: RE | Admit: 2018-08-10 | Discharge: 2018-08-10 | Disposition: A | Discharge status: Home / Self Care | Payer: Medicare Other | Source: Ambulatory Visit | Attending: Sports Medicine | Admitting: Sports Medicine

## 2018-08-10 DIAGNOSIS — M25512 Pain in left shoulder: Secondary | ICD-10-CM

## 2018-08-10 DIAGNOSIS — G8929 Other chronic pain: Secondary | ICD-10-CM

## 2018-08-13 NOTE — Progress Notes (Signed)
Reassuring findings.  Has an appointment with me on 08/16/2018.  We will review these results at that time.

## 2018-08-16 ENCOUNTER — Ambulatory Visit (INDEPENDENT_AMBULATORY_CARE_PROVIDER_SITE_OTHER): Payer: Medicare Other | Admitting: Sports Medicine

## 2018-08-16 ENCOUNTER — Encounter: Payer: Self-pay | Admitting: Sports Medicine

## 2018-08-16 VITALS — BP 128/68 | HR 69 | Ht 69.0 in | Wt 156.0 lb

## 2018-08-16 DIAGNOSIS — R937 Abnormal findings on diagnostic imaging of other parts of musculoskeletal system: Secondary | ICD-10-CM | POA: Diagnosis not present

## 2018-08-16 DIAGNOSIS — M25512 Pain in left shoulder: Secondary | ICD-10-CM | POA: Diagnosis not present

## 2018-08-16 DIAGNOSIS — G8929 Other chronic pain: Secondary | ICD-10-CM

## 2018-08-16 MED ORDER — NITROGLYCERIN 0.2 MG/HR TD PT24: MEDICATED_PATCH | TRANSDERMAL | 1 refills | Status: DC

## 2018-08-16 NOTE — Progress Notes (Signed)
Brian Graham. [name], [place] at [place]  Brian Graham - 67 y.o. male MRN [id number]  Date of birth: [birth year]  Visit Date: 08/16/2018  PCP: [name], MD   Referred by: [name], MD  Scribe(s) for today's visit: [name], CMA  SUBJECTIVE:  Brian Graham is here for Follow-up (L shoulder pain and MRI review)   06/15/2018: His L shoulder pain symptoms INITIALLY: [place] down some stairs a couple weeks ago and he feels he may have tried to brace himself w/ his arms which likely led to his current L shoulder pain. Described as severe, sharp and burning pain, nonradiating Worsened with shoulder ROM above 90 deg (aBd >flex); IR behind the back; sleeping on his side Improved with Aleve, ice Additional associated symptoms include: mechanical symptoms noted in the L shoulder; no N/T noted in L UE   At this time symptoms are worsening compared to onset. He has been taking Aleve and using ice.  He has also been doing some home exercises prescribed by Dr. [name] but doesn't notice any improvement.  07/03/2018: Compared to the last office visit, his previously described symptoms show no change, a little better but not significantly. He stiff has difficulty raising arm overhead and reaching behind him. Pain is mostly posterior. He has noticed clicking and popping and reports that "it kind of feels good when it pops".  Current symptoms are moderate-severe & are nonradiating He has been taking Aleve and icing prn with minimal relief.  Received steroid injection 06/15/2018 and tolerated well. He reports some improvement with pain but still has decreased ROM. He has been taking it easy, if he "does something stupid" he can feel it, "it burns".   08/16/2018: Compared to the last office visit on 07/03/18, his previously described L shoulder symptoms are improving slowly.  He states that the burning pain is  decreasing.  He notes that he con't to having pain if he moves quickly.  He states that his overhead L shoulder ROM is improving.  He is also able to don/doff his shirt independently. Current symptoms are mod-severe (6/10) & are nonradiating He has been doing his HEP 2x/week but notes that they increase his pain.  He had a steroid injection on 06/15/18 which briefly helped w/ his symptoms.   REVIEW OF SYSTEMS: Reports night time disturbances, improving. Denies fevers, chills, or night sweats. Denies unexplained weight loss. Denies personal history of cancer. Denies changes in bowel or bladder habits. Denies recent unreported falls. Denies new or worsening dyspnea or wheezing. Denies headaches or dizziness.  Denies numbness, tingling or weakness  In the extremities.  Denies dizziness or presyncopal episodes Denies lower extremity edema    HISTORY:  Prior history reviewed and updated per electronic medical record.  Social History   Occupational History  . Not on file  Tobacco Use  . Smoking status: Former Smoker    Packs/day: 1.00    Years: 45.00    Pack years: 45.00    Types: Cigarettes    Last attempt to quit: 07/10/2017    Years since quitting: 1.1  . Smokeless tobacco: Never Used  Substance and Sexual Activity  . Alcohol use: Yes    Alcohol/week: 14.0 standard drinks    Types: 14 Shots of liquor per week    Comment: daily 2 shots of vodka  . Drug use: No  . Sexual activity: Not Currently   Social History  Social History Narrative   Lives with wife. 2 adopted daughters from [place].       Retired from [employer]:  No results for input(s): HGBA1C, LABURIC, CREATINE in the last 8760 hours. . 08/10/2018: MRI left shoulder without contrast: Small amount of thickening of the inferior joint capsule possibly associated with adhesive capsulitis otherwise small amount of subacromial/subdeltoid bursal fluid and mild amount of tendinosis of the  supraspinatus and infraspinatus. .   OBJECTIVE:  VS:  HT:5\' 9"  (175.3 cm)   WT:156 lb (70.8 kg)  BMI:23.03    BP:128/68  HR:69bpm  TEMP: ( )  RESP:95 %   PHYSICAL EXAM: CONSTITUTIONAL: Well-developed, Well-nourished and In no acute distress PSYCHIATRIC: Alert & appropriately interactive. and Not depressed or anxious appearing. RESPIRATORY: No increased work of breathing and Trachea Midline EYES: Pupils are equal., EOM intact without nystagmus. and No scleral icterus.  VASCULAR EXAM: Warm and well perfused NEURO: unremarkable  MSK Exam: Left shoulder  Well aligned, no significant deformity. No overlying skin changes. TTP over Anterior and posterior shoulder   RANGE OF MOTION & STRENGTH  Limited and painful: Overhead range of motion, external rotation and abduction.   SPECIALITY TESTING:  Intrinsic rotator cuff strength testing is intact.  Pain directly over the pectoralis insertion is markedly improved.  Empty can, speeds, O'Briens testing are all normal with only mild pain.  Minimal pain with axial loading and circumduction.    ASSESSMENT   1. Chronic left shoulder pain   2. Abnormal bone xray   3. Left shoulder pain, unspecified chronicity     PLAN:  Pertinent additional documentation may be included in corresponding procedure notes, imaging studies, problem based documentation and patient instructions.  Procedures:  . None  Medications:  Meds ordered this encounter  Medications  . nitroGLYCERIN (NITRODUR - DOSED IN MG/24 HR) 0.2 mg/hr patch    Sig: Place 1/4 to 1/2 of a patch over affected region. Remove and replace once daily.  Slightly alter skin placement daily    Dispense:  30 patch    Refill:  1    For musculoskeletal purposes.  Okay to cut patch.   Discussion/Instructions: No problem-specific Assessment & Plan notes found for this encounter.  . Discussed options with the patient today including biologic treatment with topical nitroglycerin. Patient  has no contraindications & understands the risks, benefits and intentions of treatment. Emphasized the importance of rotating sites as well as appropriate and expected adverse reactions including orthostasis, headache, adhesive sensitivity. Begin with 1/4 patch to the affected area. Okay to titrate to half a patch as tolerated. . Overall reassuring findings on MRI.  We will plan to have him begin with physical therapy and begin on nitroglycerin protocol. . He has been referred to hematology for further evaluation and treatment of MGUS. Marland Kitchen Referral placed To physical therapy . Discussed red flag symptoms that warrant earlier emergent evaluation and patient voices understanding. . Activity modifications and the importance of avoiding exacerbating activities (limiting pain to no more than a 4 / 10 during or following activity) recommended and discussed.  Follow-up:  . Return in about 6 weeks (around 09/27/2018).   . If any lack of improvement consider: Repeat MSK ultrasound      CMA/ATC served as scribe during this visit. History, Physical, and Plan performed by medical provider. Documentation and orders reviewed and attested to.      Gerda Diss, [facility] Physician

## 2018-08-16 NOTE — Patient Instructions (Addendum)

## 2018-08-22 ENCOUNTER — Encounter: Payer: Self-pay | Admitting: Physical Therapy

## 2018-08-22 ENCOUNTER — Ambulatory Visit: Payer: Medicare Other | Admitting: Physical Therapy

## 2018-08-22 DIAGNOSIS — M25512 Pain in left shoulder: Secondary | ICD-10-CM | POA: Diagnosis not present

## 2018-08-22 DIAGNOSIS — M25612 Stiffness of left shoulder, not elsewhere classified: Secondary | ICD-10-CM

## 2018-08-22 NOTE — Patient Instructions (Signed)
Access Code: [email or web address]  URL: [email or web address]  Date: 08/22/2018  Prepared by: Lyndee Hensen   Exercises  Supine Shoulder Flexion AAROM with Hands Clasped - 10 reps - 1 sets - 2x daily  Supine Shoulder Flexion Extension AAROM with Dowel - 10 reps - 1 sets - 2x daily  Supine Shoulder External Rotation with Dowel - 10 reps - 1 sets - 2x daily  Standing Scapular Retraction - 10 reps - 1 sets - 2x daily  Seated Shoulder Flexion Towel Slide at Table Top - 10 reps - 1 sets - 2x daily

## 2018-08-22 NOTE — Therapy (Signed)
[facility] [address] [place], [place], [address] Phone: [phone]   Fax:  [phone]  Physical Therapy Evaluation  Patient Details  Name: Brian Graham MRN: [id number] Date of Birth: 02/22/1951 Referring Provider (PT): Teresa Coombs   Encounter Date: 08/22/2018  PT End of Session - 08/22/18 1054    Visit Number  1    Number of Visits  12    Date for PT Re-Evaluation  10/03/18    Authorization Type  [employer] medicare    PT Start Time  1020    PT Stop Time  1055    PT Time Calculation (min)  35 min    Activity Tolerance  Patient tolerated treatment well;Patient limited by pain    Behavior During Therapy  [facility] for tasks assessed/performed       Past Medical History:  Diagnosis Date  . Anxiety   . Arthritis    "left knee" (08/05/2016)  . Bruises easily   . CAD (coronary artery disease) of artery bypass graft 08/05/2016   Around age 59. CABG - 3 vessel.   . Chronic kidney disease   . Colon polyp   . COPD, severe ([place]) 08/23/2016   Alpha 1 studies normal per prior pulmonary group notes Smoked 40 years, quit in 2012 June 2016 PFT from prior pulmonary group: "significant obstruction" FEV1 1.58L (47% pred), Residual volume 171% pred, DLCO 59% pred Simple Spirometry>> 09/15/2016 ratio 42% FEV1 1.02 L / 31%   . Essential tremor 06/11/2016   Plans to see Dr. Carles Collet  . Former smoker 09/01/2016   Quit 2012. 40 pack years at least.   . GERD (gastroesophageal reflux disease)   . Headache   . History of blood transfusion 08/1997   "when he had his heart surgery"  . History of shingles 1970-2013 X 3  . HTN (hypertension) 08/05/2016   Amlodipine 5mg , atenolol 50mg  BID, benazepril 20mg   . Hyperlipemia   . Hypothyroidism   . Lumbar disc disease   . Myocardial infarction ([place]) 08/1997  . Peripheral vascular disease ([place])   . PONV (postoperative nausea and vomiting)   . Urinary tract infection 09/03/2017  . Wears glasses     Past  Surgical History:  Procedure Laterality Date  . AORTOGRAM Right 08/31/2017   Procedure: AORTOGRAM BILATERAL PELVIC ANGIOGRAM WITH LEFT ILIAC ARTERY STENT;  Surgeon: Conrad Caro, MD;  Location: [facility];  Service: Vascular;  Laterality: Right;  . BACK SURGERY    . CARDIAC CATHETERIZATION  08/1997  . CORONARY ARTERY BYPASS GRAFT  09/12/1997   "triple"  . INGUINAL HERNIA REPAIR Right 1961  . KNEE RECONSTRUCTION Left 1960s - 1974 X 4  . LAPAROSCOPIC ABLATION RENAL MASS  ~ 2014   "large abscess/pus ball"  . [place]   "they were wedged in the bowel"  . PATELLA FRACTURE SURGERY Left 1963  . PATELLA FRACTURE SURGERY Left ~ 1965   "removed knee cap"  . ROBOT ASSISTED LAPAROSCOPIC NEPHRECTOMY Left 10/07/2017   Procedure: XI ROBOTIC ASSISTED LAPAROSCOPIC NEPHRECTOMY OF PELVIC KIDNEY;  Surgeon: Alexis Frock, MD;  Location: [facility];  Service: Urology;  Laterality: Left;  Place robot patient tower at foot of bed like prostate per Dr. Tresa Moore. Pull extra staple loads.  . TONSILLECTOMY      There were no vitals filed for this visit.   Subjective Assessment - 08/22/18 1021    Subjective  Pt had fall in June, fell on stairs, onto arm. He had  recent MRI, RTC involved, no tears. Also had injection, which he states did not help at all. He is R handed, retired. He usually plays golf, has been unable due to pain. Pt states great difficulty with lifting/elevating arm.     Limitations  Lifting;Writing;House hold activities    Diagnostic tests  MRI:     Patient Stated Goals  Decreased pain, improved motion;     Currently in Pain?  Yes    Pain Score  9     Pain Location  Shoulder    Pain Orientation  Left    Pain Descriptors / Indicators  Burning    Pain Type  Acute pain    Pain Onset  More than a month ago    Pain Frequency  Intermittent    Aggravating Factors   Lifting, behing back motions, any elevation, movement.     Pain Relieving Factors  rest, ice.          [facility] PT  Assessment - 08/22/18 0001      Assessment   Medical Diagnosis  L shoulder pain    Referring Provider (PT)  Teresa Coombs    Onset Date/Surgical Date  05/06/18    Hand Dominance  Right    Prior Therapy  No      Precautions   Precautions  None      Balance Screen   Has the patient fallen in the past 6 months  Yes    How many times?  1    Has the patient had a decrease in activity level because of a fear of falling?   No    Is the patient reluctant to leave their home because of a fear of falling?   No      Prior Function   Level of Independence  Independent      Cognition   Overall Cognitive Status  Within Functional Limits for tasks assessed      ROM / Strength   AROM / PROM / Strength  AROM;PROM;Strength      AROM   AROM Assessment Site  Shoulder    Right/Left Shoulder  Left    Left Shoulder Flexion  105 Degrees    Left Shoulder ABduction  65 Degrees    Left Shoulder Internal Rotation  60 Degrees    Left Shoulder External Rotation  45 Degrees      PROM   PROM Assessment Site  Shoulder    Right/Left Shoulder  Left    Left Shoulder Flexion  115 Degrees    Left Shoulder ABduction  110 Degrees    Left Shoulder Internal Rotation  60 Degrees    Left Shoulder External Rotation  45 Degrees      Strength   Strength Assessment Site  Shoulder    Right/Left Shoulder  Left    Left Shoulder Flexion  3-/5    Left Shoulder ABduction  3-/5    Left Shoulder Internal Rotation  4+/5    Left Shoulder External Rotation  4/5      Palpation   Palpation comment  Hypomobile L GHJ,       Special Tests   Other special tests  Increased pain with resisted ER;  Other special tests unremarkable, pt with increased pain with all motions, unable to perform special tests due to lack of ROM.                 Objective measurements completed on examination: See above findings.  [facility] Adult PT Treatment/Exercise - 08/22/18 0001      Exercises   Exercises  Shoulder       Shoulder Exercises: Supine   Flexion  AAROM;10 reps    Flexion Limitations  cane and with 2 UE hand hold       Shoulder Exercises: Seated   Retraction  20 reps      Shoulder Exercises: Stretch   Table Stretch - Flexion  10 seconds;5 reps      Manual Therapy   Manual Therapy  Joint mobilization;Passive ROM    Joint Mobilization  L GHJ all motions    Passive ROM  L shoulder, all motions             PT Education - 08/22/18 1054    Education Details  PT POC, HEP     Person(s) Educated  Patient    Methods  Explanation;Handout;Demonstration;Tactile cues;Verbal cues    Comprehension  Verbalized understanding;Need further instruction       PT Short Term Goals - 08/22/18 1055      PT SHORT TERM GOAL #1   Title  Pt to be independent with initial HEP     Time  2    Period  Weeks    Status  New    Target Date  09/05/18      PT SHORT TERM GOAL #2   Title  Pt to demo improved L shoulder PROM by at least 10 degrees    Time  2    Period  Weeks    Status  New    Target Date  09/05/18      PT SHORT TERM GOAL #3   Title  Pt to report decreased pain in l shoulder to 6/10     Time  2    Period  Weeks    Status  New    Target Date  09/05/18        PT Long Term Goals - 08/22/18 1056      PT LONG TERM GOAL #1   Title  Pt to be independent with final HEP     Time  6    Period  Weeks    Status  New    Target Date  10/03/18      PT LONG TERM GOAL #2   Title  Pt to report decreased pain in L shoulder, to 0-2/10 with activity     Time  6    Period  Weeks    Status  New    Target Date  10/03/18      PT LONG TERM GOAL #3   Title  Pt to demo improved L shoulder AROM to [facility], to improve ability for ADLs and IADLs.     Time  6    Status  New    Target Date  10/03/18      PT LONG TERM GOAL #4   Title  Pt to demo improved strength of L shoulder to be at least 4+/5 to improve ability for lifting, reaching, carrying, and IADls     Time  6    Period  Weeks    Target Date   10/03/18             Plan - 08/22/18 1058    Clinical Impression Statement  Pt presents with primary complaint of increased pain in L shoulder. Pt with signfiicant pain with movement and elevation. He has significant joint stiffness and hypomobility that is limitng PROm and  AROM. Pt with inability for full AROM, due to stiffness and pain. Pt with muscle weakness as well. Pt with decreased ability for full functional activities, lifting, reaching, carrying, elevation, ADLs, and IADLs. Pt to benefit from skilled care to improve deficits and return to PLOF with decreased pain. MRI shows minimal RTC involvement, expect that most pain is stemming from secondary joint stiffness that has developed.     Clinical Presentation  Stable    Clinical Decision Making  Low    Rehab Potential  Good    PT Frequency  2x / week    PT Duration  6 weeks    PT Treatment/Interventions  ADLs/Self Care Home Management;Cryotherapy;Electrical Stimulation;Iontophoresis 4mg /ml Dexamethasone;Functional mobility training;Ultrasound;Moist Heat;Therapeutic activities;Therapeutic exercise;Neuromuscular re-education;Patient/family education;Passive range of motion;Manual techniques;Dry needling    Consulted and Agree with Plan of Care  Patient       Patient will benefit from skilled therapeutic intervention in order to improve the following deficits and impairments:  Decreased range of motion, Impaired UE functional use, Increased muscle spasms, Decreased activity tolerance, Pain, Improper body mechanics, Impaired flexibility, Hypomobility, Decreased mobility, Decreased strength  Visit Diagnosis: Acute pain of left shoulder  Stiffness of left shoulder, not elsewhere classified     Problem List Patient Active Problem List   Diagnosis Date Noted  . MGUS (monoclonal gammopathy of unknown significance) 08/02/2018  . Osteoporosis 06/26/2018  . Perennial allergic rhinitis 01/24/2018  . 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 Urinary tract infection 09/03/2017  . Constipation 09/03/2017  . Anxiety 07/24/2017  . Atherosclerosis of native arteries of extremity with intermittent claudication ([place]) 07/06/2017  . Pyelonephritis 04/23/2017  . Weight loss 04/23/2017  . History of adenomatous polyp of colon 04/22/2017  . Aortic atherosclerosis ([place]) 04/13/2017  . Hyperlipidemia 04/04/2017  . Hypothyroidism (acquired) 04/04/2017  . Seasonal allergies 04/04/2017  . Myalgia 10/04/2016  . Former smoker 09/01/2016  . COPD, severe ([place]) 08/23/2016  . Dysuria 08/23/2016  . Chronic pain of both knees 08/23/2016  . Hx of CABG 08/05/2016  . CAD (coronary artery disease) of artery bypass graft 08/05/2016  . Essential hypertension 08/05/2016  . Essential tremor 06/11/2016  . Tension headache 06/11/2016    Lyndee Hensen, PT, DPT 1:40 PM  08/22/18    [place], [place], [address] Phone: [phone]   Fax:  [phone]  Name: Brian Graham MRN: [id number] Date of Birth: 08-19-1951

## 2018-08-23 ENCOUNTER — Ambulatory Visit (HOSPITAL_COMMUNITY)
Admission: RE | Admit: 2018-08-23 | Discharge: 2018-08-23 | Disposition: A | Discharge status: Home / Self Care | Payer: Medicare Other | Source: Ambulatory Visit | Attending: Hematology and Oncology | Admitting: Hematology and Oncology

## 2018-08-23 ENCOUNTER — Telehealth: Payer: Self-pay | Admitting: Hematology and Oncology

## 2018-08-23 DIAGNOSIS — I700 Atherosclerosis of aorta: Secondary | ICD-10-CM | POA: Insufficient documentation

## 2018-08-23 DIAGNOSIS — D472 Monoclonal gammopathy: Secondary | ICD-10-CM | POA: Diagnosis present

## 2018-08-23 DIAGNOSIS — Q7649 Other congenital malformations of spine, not associated with scoliosis: Secondary | ICD-10-CM | POA: Insufficient documentation

## 2018-08-23 NOTE — Telephone Encounter (Signed)
Inform the patient that the bone survey was normal

## 2018-08-28 ENCOUNTER — Encounter: Payer: Self-pay | Admitting: Physical Therapy

## 2018-08-28 ENCOUNTER — Ambulatory Visit (INDEPENDENT_AMBULATORY_CARE_PROVIDER_SITE_OTHER): Payer: Medicare Other | Admitting: Physical Therapy

## 2018-08-28 DIAGNOSIS — M25612 Stiffness of left shoulder, not elsewhere classified: Secondary | ICD-10-CM

## 2018-08-28 DIAGNOSIS — M25512 Pain in left shoulder: Secondary | ICD-10-CM | POA: Diagnosis not present

## 2018-08-28 NOTE — Therapy (Signed)
[facility] [address] [place], [place], [address] Phone: [phone]   Fax:  [phone]  Physical Therapy Treatment  Patient Details  Name: Brian Graham MRN: [id number] Date of Birth: October 24, 1951 Referring Provider (PT): Teresa Coombs   Encounter Date: 08/28/2018  PT End of Session - 08/28/18 1058    Visit Number  2    Number of Visits  12    Date for PT Re-Evaluation  10/03/18    Authorization Type  [employer] medicare    PT Start Time  1009    PT Stop Time  1049    PT Time Calculation (min)  40 min    Activity Tolerance  Patient tolerated treatment well;Patient limited by pain    Behavior During Therapy  [facility] for tasks assessed/performed       Past Medical History:  Diagnosis Date  . Anxiety   . Arthritis    "left knee" (08/05/2016)  . Bruises easily   . CAD (coronary artery disease) of artery bypass graft 08/05/2016   Around age 18. CABG - 3 vessel.   . Chronic kidney disease   . Colon polyp   . COPD, severe ([place]) 08/23/2016   Alpha 1 studies normal per prior pulmonary group notes Smoked 40 years, quit in 2012 June 2016 PFT from prior pulmonary group: "significant obstruction" FEV1 1.58L (47% pred), Residual volume 171% pred, DLCO 59% pred Simple Spirometry>> 09/15/2016 ratio 42% FEV1 1.02 L / 31%   . Essential tremor 06/11/2016   Plans to see Dr. Carles Collet  . Former smoker 09/01/2016   Quit 2012. 40 pack years at least.   . GERD (gastroesophageal reflux disease)   . Headache   . History of blood transfusion 08/1997   "when he had his heart surgery"  . History of shingles 1970-2013 X 3  . HTN (hypertension) 08/05/2016   Amlodipine 5mg , atenolol 50mg  BID, benazepril 20mg   . Hyperlipemia   . Hypothyroidism   . Lumbar disc disease   . Myocardial infarction ([place]) 08/1997  . Peripheral vascular disease ([place])   . PONV (postoperative nausea and vomiting)   . Urinary tract infection 09/03/2017  . Wears glasses     Past  Surgical History:  Procedure Laterality Date  . AORTOGRAM Right 08/31/2017   Procedure: AORTOGRAM BILATERAL PELVIC ANGIOGRAM WITH LEFT ILIAC ARTERY STENT;  Surgeon: Conrad Barrville, MD;  Location: [facility];  Service: Vascular;  Laterality: Right;  . BACK SURGERY    . CARDIAC CATHETERIZATION  08/1997  . CORONARY ARTERY BYPASS GRAFT  09/12/1997   "triple"  . INGUINAL HERNIA REPAIR Right 1961  . KNEE RECONSTRUCTION Left 1960s - 1974 X 4  . LAPAROSCOPIC ABLATION RENAL MASS  ~ 2014   "large abscess/pus ball"  . [place]   "they were wedged in the bowel"  . PATELLA FRACTURE SURGERY Left 1963  . PATELLA FRACTURE SURGERY Left ~ 1965   "removed knee cap"  . ROBOT ASSISTED LAPAROSCOPIC NEPHRECTOMY Left 10/07/2017   Procedure: XI ROBOTIC ASSISTED LAPAROSCOPIC NEPHRECTOMY OF PELVIC KIDNEY;  Surgeon: Alexis Frock, MD;  Location: [facility];  Service: Urology;  Laterality: Left;  Place robot patient tower at foot of bed like prostate per Dr. Tresa Moore. Pull extra staple loads.  . TONSILLECTOMY      There were no vitals filed for this visit.  Subjective Assessment - 08/28/18 1057    Subjective  Pt states he is "sore all over today", in back, neck, and shoulder.  He has been doing HEP.     Currently in Pain?  Yes    Pain Score  7     Pain Location  Shoulder    Pain Orientation  Left    Pain Descriptors / Indicators  Burning    Pain Type  Acute pain    Pain Onset  More than a month ago    Pain Frequency  Intermittent         [facility] PT Assessment - 08/28/18 0001      PROM   Left Shoulder Flexion  125 Degrees                   [facility] Adult PT Treatment/Exercise - 08/28/18 1009      Exercises   Exercises  Shoulder      Shoulder Exercises: Supine   External Rotation  AROM;20 reps    External Rotation Limitations  cane    Flexion  AAROM;20 reps    Flexion Limitations  cane     Other Supine Exercises  SA punch with cane x15;     Other Supine Exercises  90/90 small  range AROM, up/down and L/R x15 each;       Shoulder Exercises: Seated   Retraction  --      Shoulder Exercises: Standing   Row  20 reps    Theraband Level (Shoulder Row)  Level 2 (Red)    Other Standing Exercises  IR behind back, 2 hand hold x10;       Shoulder Exercises: Pulleys   Flexion  2 minutes    Scaption  1 minute      Shoulder Exercises: Stretch   Table Stretch - Flexion  10 seconds;5 reps   standing at counter     Manual Therapy   Manual Therapy  Joint mobilization;Passive ROM    Joint Mobilization  L GHJ all motions    Passive ROM  L shoulder, all motions               PT Short Term Goals - 08/22/18 1055      PT SHORT TERM GOAL #1   Title  Pt to be independent with initial HEP     Time  2    Period  Weeks    Status  New    Target Date  09/05/18      PT SHORT TERM GOAL #2   Title  Pt to demo improved L shoulder PROM by at least 10 degrees    Time  2    Period  Weeks    Status  New    Target Date  09/05/18      PT SHORT TERM GOAL #3   Title  Pt to report decreased pain in l shoulder to 6/10     Time  2    Period  Weeks    Status  New    Target Date  09/05/18        PT Long Term Goals - 08/22/18 1056      PT LONG TERM GOAL #1   Title  Pt to be independent with final HEP     Time  6    Period  Weeks    Status  New    Target Date  10/03/18      PT LONG TERM GOAL #2   Title  Pt to report decreased pain in L shoulder, to 0-2/10 with activity     Time  6  Period  Weeks    Status  New    Target Date  10/03/18      PT LONG TERM GOAL #3   Title  Pt to demo improved L shoulder AROM to [facility], to improve ability for ADLs and IADLs.     Time  6    Status  New    Target Date  10/03/18      PT LONG TERM GOAL #4   Title  Pt to demo improved strength of L shoulder to be at least 4+/5 to improve ability for lifting, reaching, carrying, and IADls     Time  6    Period  Weeks    Target Date  10/03/18            Plan - 08/28/18 1059     Clinical Impression Statement  Pt with improvements in PROM today, since last visit. HEP reviewed for correct mechanics. Focus of treatments will be improivng ROM and pain. Plan to progress as pt tolerates. He declines ice at end of session today.      Rehab Potential  Good    PT Frequency  2x / week    PT Duration  6 weeks    PT Treatment/Interventions  ADLs/Self Care Home Management;Cryotherapy;Electrical Stimulation;Iontophoresis 4mg /ml Dexamethasone;Functional mobility training;Ultrasound;Moist Heat;Therapeutic activities;Therapeutic exercise;Neuromuscular re-education;Patient/family education;Passive range of motion;Manual techniques;Dry needling    Consulted and Agree with Plan of Care  Patient       Patient will benefit from skilled therapeutic intervention in order to improve the following deficits and impairments:  Decreased range of motion, Impaired UE functional use, Increased muscle spasms, Decreased activity tolerance, Pain, Improper body mechanics, Impaired flexibility, Hypomobility, Decreased mobility, Decreased strength  Visit Diagnosis: Acute pain of left shoulder  Stiffness of left shoulder, not elsewhere classified     Problem List Patient Active Problem List   Diagnosis Date Noted  . MGUS (monoclonal gammopathy of unknown significance) 08/02/2018  . Osteoporosis 06/26/2018  . Perennial allergic rhinitis 01/24/2018  . 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 Urinary tract infection 09/03/2017  . Constipation 09/03/2017  . Anxiety 07/24/2017  . Atherosclerosis of native arteries of extremity with intermittent claudication ([place]) 07/06/2017  . Pyelonephritis 04/23/2017  . Weight loss 04/23/2017  . History of adenomatous polyp of colon 04/22/2017  . Aortic atherosclerosis ([place]) 04/13/2017  . Hyperlipidemia 04/04/2017  . Hypothyroidism (acquired) 04/04/2017  . Seasonal allergies 04/04/2017  . Myalgia 10/04/2016  . Former smoker  09/01/2016  . COPD, severe ([place]) 08/23/2016  . Dysuria 08/23/2016  . Chronic pain of both knees 08/23/2016  . Hx of CABG 08/05/2016  . CAD (coronary artery disease) of artery bypass graft 08/05/2016  . Essential hypertension 08/05/2016  . Essential tremor 06/11/2016  . Tension headache 06/11/2016    Lyndee Hensen, PT, DPT 11:01 AM  08/28/18    [facility] [place], [place], [address] Phone: [phone]   Fax:  [phone]  Name: Brian Graham MRN: [id number] Date of Birth: 11/08/1951

## 2018-08-29 ENCOUNTER — Other Ambulatory Visit: Payer: Self-pay | Admitting: Family Medicine

## 2018-08-31 ENCOUNTER — Encounter: Payer: Self-pay | Admitting: Physical Therapy

## 2018-08-31 ENCOUNTER — Ambulatory Visit (INDEPENDENT_AMBULATORY_CARE_PROVIDER_SITE_OTHER): Payer: Medicare Other | Admitting: Physical Therapy

## 2018-08-31 DIAGNOSIS — M25612 Stiffness of left shoulder, not elsewhere classified: Secondary | ICD-10-CM

## 2018-08-31 DIAGNOSIS — M25512 Pain in left shoulder: Secondary | ICD-10-CM | POA: Diagnosis not present

## 2018-08-31 NOTE — Therapy (Signed)
[facility] [address] [place], [place], [address] Phone: [phone]   Fax:  [phone]  Physical Therapy Treatment  Patient Details  Name: Brian Graham MRN: [id number] Date of Birth: August 09, 1951 Referring Provider (PT): Teresa Coombs   Encounter Date: 08/31/2018  PT End of Session - 08/31/18 1059    Visit Number  3    Number of Visits  12    Date for PT Re-Evaluation  10/03/18    Authorization Type  [employer] medicare    PT Start Time  1016    PT Stop Time  1046    PT Time Calculation (min)  30 min    Activity Tolerance  Patient tolerated treatment well;Patient limited by pain    Behavior During Therapy  [facility] for tasks assessed/performed       Past Medical History:  Diagnosis Date  . Anxiety   . Arthritis    "left knee" (08/05/2016)  . Bruises easily   . CAD (coronary artery disease) of artery bypass graft 08/05/2016   Around age 23. CABG - 3 vessel.   . Chronic kidney disease   . Colon polyp   . COPD, severe ([place]) 08/23/2016   Alpha 1 studies normal per prior pulmonary group notes Smoked 40 years, quit in 2012 June 2016 PFT from prior pulmonary group: "significant obstruction" FEV1 1.58L (47% pred), Residual volume 171% pred, DLCO 59% pred Simple Spirometry>> 09/15/2016 ratio 42% FEV1 1.02 L / 31%   . Essential tremor 06/11/2016   Plans to see Dr. Carles Collet  . Former smoker 09/01/2016   Quit 2012. 40 pack years at least.   . GERD (gastroesophageal reflux disease)   . Headache   . History of blood transfusion 08/1997   "when he had his heart surgery"  . History of shingles 1970-2013 X 3  . HTN (hypertension) 08/05/2016   Amlodipine 5mg , atenolol 50mg  BID, benazepril 20mg   . Hyperlipemia   . Hypothyroidism   . Lumbar disc disease   . Myocardial infarction ([place]) 08/1997  . Peripheral vascular disease ([place])   . PONV (postoperative nausea and vomiting)   . Urinary tract infection 09/03/2017  . Wears glasses     Past  Surgical History:  Procedure Laterality Date  . AORTOGRAM Right 08/31/2017   Procedure: AORTOGRAM BILATERAL PELVIC ANGIOGRAM WITH LEFT ILIAC ARTERY STENT;  Surgeon: Conrad [place], MD;  Location: [place];  Service: Vascular;  Laterality: Right;  . BACK SURGERY    . CARDIAC CATHETERIZATION  08/1997  . CORONARY ARTERY BYPASS GRAFT  09/12/1997   "triple"  . INGUINAL HERNIA REPAIR Right 1961  . KNEE RECONSTRUCTION Left 1960s - 1974 X 4  . LAPAROSCOPIC ABLATION RENAL MASS  ~ 2014   "large abscess/pus ball"  . [place]   "they were wedged in the bowel"  . PATELLA FRACTURE SURGERY Left 1963  . PATELLA FRACTURE SURGERY Left ~ 1965   "removed knee cap"  . ROBOT ASSISTED LAPAROSCOPIC NEPHRECTOMY Left 10/07/2017   Procedure: XI ROBOTIC ASSISTED LAPAROSCOPIC NEPHRECTOMY OF PELVIC KIDNEY;  Surgeon: Alexis Frock, MD;  Location: [facility];  Service: Urology;  Laterality: Left;  Place robot patient tower at foot of bed like prostate per Dr. Tresa Moore. Pull extra staple loads.  . TONSILLECTOMY      There were no vitals filed for this visit.  Subjective Assessment - 08/31/18 1059    Subjective  Pt states he had increased soreness after last visit, better today. He has  been doing HEP.     Currently in Pain?  Yes    Pain Score  6     Pain Location  Shoulder    Pain Orientation  Left    Pain Descriptors / Indicators  Burning    Pain Type  Acute pain    Pain Onset  More than a month ago    Pain Frequency  Intermittent         [facility] PT Assessment - 08/31/18 0001      AROM   Left Shoulder Flexion  110 Degrees    Left Shoulder ABduction  95 Degrees                   [facility] Adult PT Treatment/Exercise - 08/31/18 1019      Exercises   Exercises  Shoulder      Shoulder Exercises: Supine   External Rotation  AROM;20 reps    External Rotation Limitations  cane    Flexion  AAROM;20 reps    Flexion Limitations  cane     Other Supine Exercises  --    Other Supine  Exercises  90/90 small range AROM, up/down and L/R x15 each;       Shoulder Exercises: Sidelying   External Rotation  15 reps      Shoulder Exercises: Standing   Row  20 reps    Theraband Level (Shoulder Row)  Level 2 (Red)    Other Standing Exercises  IR behind back, 2 hand hold x10;       Shoulder Exercises: Pulleys   Flexion  2 minutes    Scaption  1 minute      Shoulder Exercises: Stretch   Table Stretch - Flexion  --      Manual Therapy   Manual Therapy  Joint mobilization;Passive ROM    Joint Mobilization  L GHJ all motions    Passive ROM  L shoulder, all motions               PT Short Term Goals - 08/22/18 1055      PT SHORT TERM GOAL #1   Title  Pt to be independent with initial HEP     Time  2    Period  Weeks    Status  New    Target Date  09/05/18      PT SHORT TERM GOAL #2   Title  Pt to demo improved L shoulder PROM by at least 10 degrees    Time  2    Period  Weeks    Status  New    Target Date  09/05/18      PT SHORT TERM GOAL #3   Title  Pt to report decreased pain in l shoulder to 6/10     Time  2    Period  Weeks    Status  New    Target Date  09/05/18        PT Long Term Goals - 08/22/18 1056      PT LONG TERM GOAL #1   Title  Pt to be independent with final HEP     Time  6    Period  Weeks    Status  New    Target Date  10/03/18      PT LONG TERM GOAL #2   Title  Pt to report decreased pain in L shoulder, to 0-2/10 with activity     Time  6    Period  Weeks    Status  New    Target Date  10/03/18      PT LONG TERM GOAL #3   Title  Pt to demo improved L shoulder AROM to [facility], to improve ability for ADLs and IADLs.     Time  6    Status  New    Target Date  10/03/18      PT LONG TERM GOAL #4   Title  Pt to demo improved strength of L shoulder to be at least 4+/5 to improve ability for lifting, reaching, carrying, and IADls     Time  6    Period  Weeks    Target Date  10/03/18            Plan - 08/31/18 1101     Clinical Impression Statement  Pt encouraged to ice following sessions (declines to do it in clinic), to prevent soreness. Pt showing mild improvements with PROM and AROM. Pt benefitting from manual PROM, and education on HEP. Plan to progress as tolerated.     Rehab Potential  Good    PT Frequency  2x / week    PT Duration  6 weeks    PT Treatment/Interventions  ADLs/Self Care Home Management;Cryotherapy;Electrical Stimulation;Iontophoresis 4mg /ml Dexamethasone;Functional mobility training;Ultrasound;Moist Heat;Therapeutic activities;Therapeutic exercise;Neuromuscular re-education;Patient/family education;Passive range of motion;Manual techniques;Dry needling    Consulted and Agree with Plan of Care  Patient       Patient will benefit from skilled therapeutic intervention in order to improve the following deficits and impairments:  Decreased range of motion, Impaired UE functional use, Increased muscle spasms, Decreased activity tolerance, Pain, Improper body mechanics, Impaired flexibility, Hypomobility, Decreased mobility, Decreased strength  Visit Diagnosis: Acute pain of left shoulder  Stiffness of left shoulder, not elsewhere classified     Problem List Patient Active Problem List   Diagnosis Date Noted  . MGUS (monoclonal gammopathy of unknown significance) 08/02/2018  . Osteoporosis 06/26/2018  . Perennial allergic rhinitis 01/24/2018  . 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 Urinary tract infection 09/03/2017  . Constipation 09/03/2017  . Anxiety 07/24/2017  . Atherosclerosis of native arteries of extremity with intermittent claudication ([place]) 07/06/2017  . Pyelonephritis 04/23/2017  . Weight loss 04/23/2017  . History of adenomatous polyp of colon 04/22/2017  . Aortic atherosclerosis ([place]) 04/13/2017  . Hyperlipidemia 04/04/2017  . Hypothyroidism (acquired) 04/04/2017  . Seasonal allergies 04/04/2017  . Myalgia 10/04/2016  . Former  smoker 09/01/2016  . COPD, severe ([place]) 08/23/2016  . Dysuria 08/23/2016  . Chronic pain of both knees 08/23/2016  . Hx of CABG 08/05/2016  . CAD (coronary artery disease) of artery bypass graft 08/05/2016  . Essential hypertension 08/05/2016  . Essential tremor 06/11/2016  . Tension headache 06/11/2016    Lyndee Hensen, PT, DPT 11:02 AM  08/31/18    [facility] [place], [place], [address] Phone: [phone]   Fax:  [phone]  Name: Brian Graham MRN: [id number] Date of Birth: September 07, 1951

## 2018-09-04 ENCOUNTER — Encounter: Payer: Self-pay | Admitting: Sports Medicine

## 2018-09-04 ENCOUNTER — Encounter: Payer: Self-pay | Admitting: Physical Therapy

## 2018-09-04 ENCOUNTER — Ambulatory Visit (INDEPENDENT_AMBULATORY_CARE_PROVIDER_SITE_OTHER): Payer: Medicare Other | Admitting: Physical Therapy

## 2018-09-04 DIAGNOSIS — M25512 Pain in left shoulder: Secondary | ICD-10-CM | POA: Diagnosis not present

## 2018-09-04 DIAGNOSIS — M25612 Stiffness of left shoulder, not elsewhere classified: Secondary | ICD-10-CM | POA: Diagnosis not present

## 2018-09-04 NOTE — Therapy (Signed)
[facility] [address] [place], [place], [address] Phone: [phone]   Fax:  [phone]  Physical Therapy Treatment  Patient Details  Name: Brian Graham MRN: [id number] Date of Birth: 09-20-1951 Referring Provider (PT): Teresa Coombs   Encounter Date: 09/04/2018  PT End of Session - 09/04/18 1222    Visit Number  4    Number of Visits  12    Date for PT Re-Evaluation  10/03/18    Authorization Type  [employer] medicare    PT Start Time  1216    PT Stop Time  1258    PT Time Calculation (min)  42 min    Activity Tolerance  Patient tolerated treatment well;Patient limited by pain    Behavior During Therapy  [facility] for tasks assessed/performed       Past Medical History:  Diagnosis Date  . Anxiety   . Arthritis    "left knee" (08/05/2016)  . Bruises easily   . CAD (coronary artery disease) of artery bypass graft 08/05/2016   Around age 57. CABG - 3 vessel.   . Chronic kidney disease   . Colon polyp   . COPD, severe ([place]) 08/23/2016   Alpha 1 studies normal per prior pulmonary group notes Smoked 40 years, quit in 2012 June 2016 PFT from prior pulmonary group: "significant obstruction" FEV1 1.58L (47% pred), Residual volume 171% pred, DLCO 59% pred Simple Spirometry>> 09/15/2016 ratio 42% FEV1 1.02 L / 31%   . Essential tremor 06/11/2016   Plans to see Dr. Carles Collet  . Former smoker 09/01/2016   Quit 2012. 40 pack years at least.   . GERD (gastroesophageal reflux disease)   . Headache   . History of blood transfusion 08/1997   "when he had his heart surgery"  . History of shingles 1970-2013 X 3  . HTN (hypertension) 08/05/2016   Amlodipine 5mg , atenolol 50mg  BID, benazepril 20mg   . Hyperlipemia   . Hypothyroidism   . Lumbar disc disease   . Myocardial infarction ([place]) 08/1997  . Peripheral vascular disease ([place])   . PONV (postoperative nausea and vomiting)   . Urinary tract infection 09/03/2017  . Wears glasses     Past  Surgical History:  Procedure Laterality Date  . AORTOGRAM Right 08/31/2017   Procedure: AORTOGRAM BILATERAL PELVIC ANGIOGRAM WITH LEFT ILIAC ARTERY STENT;  Surgeon: Conrad [place], MD;  Location: [facility];  Service: Vascular;  Laterality: Right;  . BACK SURGERY    . CARDIAC CATHETERIZATION  08/1997  . CORONARY ARTERY BYPASS GRAFT  09/12/1997   "triple"  . INGUINAL HERNIA REPAIR Right 1961  . KNEE RECONSTRUCTION Left 1960s - 1974 X 4  . LAPAROSCOPIC ABLATION RENAL MASS  ~ 2014   "large abscess/pus ball"  . [place]   "they were wedged in the bowel"  . PATELLA FRACTURE SURGERY Left 1963  . PATELLA FRACTURE SURGERY Left ~ 1965   "removed knee cap"  . ROBOT ASSISTED LAPAROSCOPIC NEPHRECTOMY Left 10/07/2017   Procedure: XI ROBOTIC ASSISTED LAPAROSCOPIC NEPHRECTOMY OF PELVIC KIDNEY;  Surgeon: Alexis Frock, MD;  Location: [facility];  Service: Urology;  Laterality: Left;  Place robot patient tower at foot of bed like prostate per Dr. Tresa Moore. Pull extra staple loads.  . TONSILLECTOMY      There were no vitals filed for this visit.  Subjective Assessment - 09/04/18 1220    Subjective  Pt states signficant soreness in arm due to getting flu shot on friday.  He has improved pain today. He also states that his arm is moving better with HEP.     Currently in Pain?  Yes    Pain Score  3     Pain Location  Shoulder    Pain Orientation  Left    Pain Descriptors / Indicators  Burning    Pain Type  Acute pain    Pain Onset  More than a month ago    Pain Frequency  Intermittent                       [facility] Adult PT Treatment/Exercise - 09/04/18 1222      Exercises   Exercises  Shoulder      Shoulder Exercises: Supine   External Rotation  AAROM;20 reps    External Rotation Limitations  cane    Flexion  AAROM;20 reps    Flexion Limitations  cane /full ROM    Other Supine Exercises  SA punch with cane x20;     Other Supine Exercises  AROM, flex and horizontal abd,  near full range, x15 each;       Shoulder Exercises: Sidelying   External Rotation  15 reps      Shoulder Exercises: Standing   Row  20 reps    Theraband Level (Shoulder Row)  Level 2 (Red)    Other Standing Exercises  IR behind back, 2 hand hold x10;       Shoulder Exercises: Pulleys   Flexion  2 minutes    Scaption  1 minute      Shoulder Exercises: Stretch   Table Stretch - Flexion  10 seconds;5 reps      Manual Therapy   Manual Therapy  Joint mobilization;Passive ROM    Joint Mobilization  L GHJ all motions    Passive ROM  L shoulder, all motions               PT Short Term Goals - 08/22/18 1055      PT SHORT TERM GOAL #1   Title  Pt to be independent with initial HEP     Time  2    Period  Weeks    Status  New    Target Date  09/05/18      PT SHORT TERM GOAL #2   Title  Pt to demo improved L shoulder PROM by at least 10 degrees    Time  2    Period  Weeks    Status  New    Target Date  09/05/18      PT SHORT TERM GOAL #3   Title  Pt to report decreased pain in l shoulder to 6/10     Time  2    Period  Weeks    Status  New    Target Date  09/05/18        PT Long Term Goals - 08/22/18 1056      PT LONG TERM GOAL #1   Title  Pt to be independent with final HEP     Time  6    Period  Weeks    Status  New    Target Date  10/03/18      PT LONG TERM GOAL #2   Title  Pt to report decreased pain in L shoulder, to 0-2/10 with activity     Time  6    Period  Weeks    Status  New  Target Date  10/03/18      PT LONG TERM GOAL #3   Title  Pt to demo improved L shoulder AROM to [facility], to improve ability for ADLs and IADLs.     Time  6    Status  New    Target Date  10/03/18      PT LONG TERM GOAL #4   Title  Pt to demo improved strength of L shoulder to be at least 4+/5 to improve ability for lifting, reaching, carrying, and IADls     Time  6    Period  Weeks    Target Date  10/03/18            Plan - 09/04/18 1424    Clinical  Impression Statement  Pt showing improvements in ROM. He continues to have significant pain at end of available range (for all motions). Pt able to perform mostly pain free AROM in supine today, will progress as able.     Rehab Potential  Good    PT Frequency  2x / week    PT Duration  6 weeks    PT Treatment/Interventions  ADLs/Self Care Home Management;Cryotherapy;Electrical Stimulation;Iontophoresis 4mg /ml Dexamethasone;Functional mobility training;Ultrasound;Moist Heat;Therapeutic activities;Therapeutic exercise;Neuromuscular re-education;Patient/family education;Passive range of motion;Manual techniques;Dry needling    Consulted and Agree with Plan of Care  Patient       Patient will benefit from skilled therapeutic intervention in order to improve the following deficits and impairments:  Decreased range of motion, Impaired UE functional use, Increased muscle spasms, Decreased activity tolerance, Pain, Improper body mechanics, Impaired flexibility, Hypomobility, Decreased mobility, Decreased strength  Visit Diagnosis: Acute pain of left shoulder  Stiffness of left shoulder, not elsewhere classified     Problem List Patient Active Problem List   Diagnosis Date Noted  . MGUS (monoclonal gammopathy of unknown significance) 08/02/2018  . Osteoporosis 06/26/2018  . Perennial allergic rhinitis 01/24/2018  . 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 Urinary tract infection 09/03/2017  . Constipation 09/03/2017  . Anxiety 07/24/2017  . Atherosclerosis of native arteries of extremity with intermittent claudication ([place]) 07/06/2017  . Pyelonephritis 04/23/2017  . Weight loss 04/23/2017  . History of adenomatous polyp of colon 04/22/2017  . Aortic atherosclerosis ([place]) 04/13/2017  . Hyperlipidemia 04/04/2017  . Hypothyroidism (acquired) 04/04/2017  . Seasonal allergies 04/04/2017  . Myalgia 10/04/2016  . Former smoker 09/01/2016  . COPD, severe ([place])  08/23/2016  . Dysuria 08/23/2016  . Chronic pain of both knees 08/23/2016  . Hx of CABG 08/05/2016  . CAD (coronary artery disease) of artery bypass graft 08/05/2016  . Essential hypertension 08/05/2016  . Essential tremor 06/11/2016  . Tension headache 06/11/2016    Lyndee Hensen, PT, DPT 2:26 PM  09/04/18    [facility] [place], [place], [address] Phone: [phone]   Fax:  [phone]  Name: Brian Graham MRN: [id number] Date of Birth: 1951/09/15

## 2018-09-06 ENCOUNTER — Encounter: Payer: Medicare Other | Admitting: Physical Therapy

## 2018-09-13 ENCOUNTER — Other Ambulatory Visit: Payer: Self-pay | Admitting: Family Medicine

## 2018-09-13 ENCOUNTER — Encounter: Payer: Self-pay | Admitting: Physical Therapy

## 2018-09-13 ENCOUNTER — Ambulatory Visit (INDEPENDENT_AMBULATORY_CARE_PROVIDER_SITE_OTHER): Payer: Medicare Other | Admitting: Physical Therapy

## 2018-09-13 DIAGNOSIS — M25512 Pain in left shoulder: Secondary | ICD-10-CM

## 2018-09-13 DIAGNOSIS — M25612 Stiffness of left shoulder, not elsewhere classified: Secondary | ICD-10-CM | POA: Diagnosis not present

## 2018-09-13 MED ORDER — TRAMADOL HCL 50 MG PO TABS: 50.0000 mg | ORAL_TABLET | Freq: Four times a day (QID) | ORAL | 0 refills | Status: DC | PRN

## 2018-09-13 NOTE — Therapy (Signed)
[facility] [address] [place], [place], [address] Phone: [phone]   Fax:  [phone]  Physical Therapy Treatment  Patient Details  Name: Brian Graham MRN: [id number] Date of Birth: 01/28/1951 Referring Provider (PT): Teresa Coombs   Encounter Date: 09/13/2018  PT End of Session - 09/13/18 1115    Visit Number  5    Number of Visits  12    Date for PT Re-Evaluation  10/03/18    Authorization Type  [employer] medicare    PT Start Time  1107    PT Stop Time  1150    PT Time Calculation (min)  43 min    Activity Tolerance  Patient tolerated treatment well;Patient limited by pain    Behavior During Therapy  [facility] for tasks assessed/performed       Past Medical History:  Diagnosis Date  . Anxiety   . Arthritis    "left knee" (08/05/2016)  . Bruises easily   . CAD (coronary artery disease) of artery bypass graft 08/05/2016   Around age 54. CABG - 3 vessel.   . Chronic kidney disease   . Colon polyp   . COPD, severe ([place]) 08/23/2016   Alpha 1 studies normal per prior pulmonary group notes Smoked 40 years, quit in 2012 June 2016 PFT from prior pulmonary group: "significant obstruction" FEV1 1.58L (47% pred), Residual volume 171% pred, DLCO 59% pred Simple Spirometry>> 09/15/2016 ratio 42% FEV1 1.02 L / 31%   . Essential tremor 06/11/2016   Plans to see Dr. Carles Collet  . Former smoker 09/01/2016   Quit 2012. 40 pack years at least.   . GERD (gastroesophageal reflux disease)   . Headache   . History of blood transfusion 08/1997   "when he had his heart surgery"  . History of shingles 1970-2013 X 3  . HTN (hypertension) 08/05/2016   Amlodipine 5mg , atenolol 50mg  BID, benazepril 20mg   . Hyperlipemia   . Hypothyroidism   . Lumbar disc disease   . Myocardial infarction ([place]) 08/1997  . Peripheral vascular disease ([place])   . PONV (postoperative nausea and vomiting)   . Urinary tract infection 09/03/2017  . Wears glasses     Past  Surgical History:  Procedure Laterality Date  . AORTOGRAM Right 08/31/2017   Procedure: AORTOGRAM BILATERAL PELVIC ANGIOGRAM WITH LEFT ILIAC ARTERY STENT;  Surgeon: Conrad [place], MD;  Location: [facility];  Service: Vascular;  Laterality: Right;  . BACK SURGERY    . CARDIAC CATHETERIZATION  08/1997  . CORONARY ARTERY BYPASS GRAFT  09/12/1997   "triple"  . INGUINAL HERNIA REPAIR Right 1961  . KNEE RECONSTRUCTION Left 1960s - 1974 X 4  . LAPAROSCOPIC ABLATION RENAL MASS  ~ 2014   "large abscess/pus ball"  . [place]   "they were wedged in the bowel"  . PATELLA FRACTURE SURGERY Left 1963  . PATELLA FRACTURE SURGERY Left ~ 1965   "removed knee cap"  . ROBOT ASSISTED LAPAROSCOPIC NEPHRECTOMY Left 10/07/2017   Procedure: XI ROBOTIC ASSISTED LAPAROSCOPIC NEPHRECTOMY OF PELVIC KIDNEY;  Surgeon: Alexis Frock, MD;  Location: [facility];  Service: Urology;  Laterality: Left;  Place robot patient tower at foot of bed like prostate per Dr. Tresa Moore. Pull extra staple loads.  . TONSILLECTOMY      There were no vitals filed for this visit.  Subjective Assessment - 09/13/18 1113    Subjective  Pt was out of town over the weekend, had to drive to [place].  Shoulder sore today from driving.     Currently in Pain?  Yes    Pain Score  7     Pain Location  Shoulder    Pain Orientation  Left    Pain Descriptors / Indicators  Burning    Pain Type  Acute pain    Pain Onset  More than a month ago    Pain Frequency  Intermittent         [facility] PT Assessment - 09/13/18 0001      PROM   Left Shoulder Flexion  125 Degrees                   [facility] Adult PT Treatment/Exercise - 09/13/18 1115      Exercises   Exercises  Shoulder      Shoulder Exercises: Supine   External Rotation  AAROM;20 reps    External Rotation Limitations  cane    Flexion  AAROM;20 reps    Flexion Limitations  cane /full ROM    Other Supine Exercises  SA punch with 2 hand hold x20;     Other Supine Exercises   AROM, flex and horizontal abd, near full range, x15 each;       Shoulder Exercises: Sidelying   External Rotation  15 reps      Shoulder Exercises: Standing   Row  20 reps    Theraband Level (Shoulder Row)  Level 2 (Red)    Other Standing Exercises  --      Shoulder Exercises: Pulleys   Flexion  2 minutes    Scaption  1 minute      Shoulder Exercises: Stretch   Table Stretch - Flexion  --      Manual Therapy   Manual Therapy  Joint mobilization;Passive ROM    Joint Mobilization  L GHJ all motions    Passive ROM  L shoulder, all motions               PT Short Term Goals - 08/22/18 1055      PT SHORT TERM GOAL #1   Title  Pt to be independent with initial HEP     Time  2    Period  Weeks    Status  New    Target Date  09/05/18      PT SHORT TERM GOAL #2   Title  Pt to demo improved L shoulder PROM by at least 10 degrees    Time  2    Period  Weeks    Status  New    Target Date  09/05/18      PT SHORT TERM GOAL #3   Title  Pt to report decreased pain in l shoulder to 6/10     Time  2    Period  Weeks    Status  New    Target Date  09/05/18        PT Long Term Goals - 08/22/18 1056      PT LONG TERM GOAL #1   Title  Pt to be independent with final HEP     Time  6    Period  Weeks    Status  New    Target Date  10/03/18      PT LONG TERM GOAL #2   Title  Pt to report decreased pain in L shoulder, to 0-2/10 with activity     Time  6    Period  Weeks  Status  New    Target Date  10/03/18      PT LONG TERM GOAL #3   Title  Pt to demo improved L shoulder AROM to [facility], to improve ability for ADLs and IADLs.     Time  6    Status  New    Target Date  10/03/18      PT LONG TERM GOAL #4   Title  Pt to demo improved strength of L shoulder to be at least 4+/5 to improve ability for lifting, reaching, carrying, and IADls     Time  6    Period  Weeks    Target Date  10/03/18            Plan - 09/13/18 1151    Clinical Impression Statement   Pt with improving PROM and AROM. He continues to have significant pain with mobility. Plan to progress ROM and strength as tolerated.     Rehab Potential  Good    PT Frequency  2x / week    PT Duration  6 weeks    PT Treatment/Interventions  ADLs/Self Care Home Management;Cryotherapy;Electrical Stimulation;Iontophoresis 4mg /ml Dexamethasone;Functional mobility training;Ultrasound;Moist Heat;Therapeutic activities;Therapeutic exercise;Neuromuscular re-education;Patient/family education;Passive range of motion;Manual techniques;Dry needling    Consulted and Agree with Plan of Care  Patient       Patient will benefit from skilled therapeutic intervention in order to improve the following deficits and impairments:  Decreased range of motion, Impaired UE functional use, Increased muscle spasms, Decreased activity tolerance, Pain, Improper body mechanics, Impaired flexibility, Hypomobility, Decreased mobility, Decreased strength  Visit Diagnosis: Acute pain of left shoulder  Stiffness of left shoulder, not elsewhere classified     Problem List Patient Active Problem List   Diagnosis Date Noted  . MGUS (monoclonal gammopathy of unknown significance) 08/02/2018  . Osteoporosis 06/26/2018  . Perennial allergic rhinitis 01/24/2018  . 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 Urinary tract infection 09/03/2017  . Constipation 09/03/2017  . Anxiety 07/24/2017  . Atherosclerosis of native arteries of extremity with intermittent claudication ([place]) 07/06/2017  . Pyelonephritis 04/23/2017  . Weight loss 04/23/2017  . History of adenomatous polyp of colon 04/22/2017  . Aortic atherosclerosis ([place]) 04/13/2017  . Hyperlipidemia 04/04/2017  . Hypothyroidism (acquired) 04/04/2017  . Seasonal allergies 04/04/2017  . Myalgia 10/04/2016  . Former smoker 09/01/2016  . COPD, severe ([place]) 08/23/2016  . Dysuria 08/23/2016  . Chronic pain of both knees 08/23/2016  . Hx  of CABG 08/05/2016  . CAD (coronary artery disease) of artery bypass graft 08/05/2016  . Essential hypertension 08/05/2016  . Essential tremor 06/11/2016  . Tension headache 06/11/2016    Lyndee Hensen, PT, DPT 11:54 AM  09/13/18    [facility] [place], [place], [address] Phone: [phone]   Fax:  [phone]  Name: Brian Graham MRN: [id number] Date of Birth: 1951-09-02

## 2018-09-18 ENCOUNTER — Encounter: Payer: Medicare Other | Admitting: Physical Therapy

## 2018-09-20 ENCOUNTER — Other Ambulatory Visit: Payer: Self-pay | Admitting: Pulmonary Disease

## 2018-09-20 ENCOUNTER — Encounter: Payer: Self-pay | Admitting: Physical Therapy

## 2018-09-20 ENCOUNTER — Ambulatory Visit (INDEPENDENT_AMBULATORY_CARE_PROVIDER_SITE_OTHER): Payer: Medicare Other | Admitting: Physical Therapy

## 2018-09-20 DIAGNOSIS — M25512 Pain in left shoulder: Secondary | ICD-10-CM

## 2018-09-20 DIAGNOSIS — M25612 Stiffness of left shoulder, not elsewhere classified: Secondary | ICD-10-CM

## 2018-09-20 NOTE — Therapy (Addendum)
[facility] [address] [place], [place], [address] Phone: [phone]   Fax:  [phone]  Physical Therapy Treatment  Patient Details  Name: Copeland Neisen MRN: [id number] Date of Birth: 1951-11-18 Referring Provider (PT): Teresa Coombs   Encounter Date: 09/20/2018  PT End of Session - 09/20/18 1305    Visit Number  5    Number of Visits  12    Date for PT Re-Evaluation  10/03/18    Authorization Type  [employer] medicare    PT Start Time  1302    PT Stop Time  1340    PT Time Calculation (min)  38 min    Activity Tolerance  Patient tolerated treatment well;Patient limited by pain    Behavior During Therapy  [facility] for tasks assessed/performed       Past Medical History:  Diagnosis Date  . Anxiety   . Arthritis    "left knee" (08/05/2016)  . Bruises easily   . CAD (coronary artery disease) of artery bypass graft 08/05/2016   Around age 67. CABG - 3 vessel.   . Chronic kidney disease   . Colon polyp   . COPD, severe ([place]) 08/23/2016   Alpha 1 studies normal per prior pulmonary group notes Smoked 40 years, quit in 2012 June 2016 PFT from prior pulmonary group: "significant obstruction" FEV1 1.58L (47% pred), Residual volume 171% pred, DLCO 59% pred Simple Spirometry>> 09/15/2016 ratio 42% FEV1 1.02 L / 31%   . Essential tremor 06/11/2016   Plans to see Dr. Carles Collet  . Former smoker 09/01/2016   Quit 2012. 40 pack years at least.   . GERD (gastroesophageal reflux disease)   . Headache   . History of blood transfusion 08/1997   "when he had his heart surgery"  . History of shingles 1970-2013 X 3  . HTN (hypertension) 08/05/2016   Amlodipine 68m, atenolol 524mBID, benazepril 2026m. Hyperlipemia   . Hypothyroidism   . Lumbar disc disease   . [place]  . [place]ipheral vascular disease ([place] . PONV (postoperative nausea and vomiting)   . Urinary tract infection 09/03/2017  . Wears glasses     Past  Surgical History:  Procedure Laterality Date  . AORTOGRAM Right 08/31/2017   Procedure: AORTOGRAM BILATERAL PELVIC ANGIOGRAM WITH LEFT ILIAC ARTERY STENT;  Surgeon: CheConrad BurlingtonD;  [facility];  Laterality: Right;  . BACK SURGERY    . CARDIAC CATHETERIZATION  08/1997  . CORONARY ARTERY BYPASS GRAFT  09/12/1997   "triple"  . INGUINAL HERNIA REPAIR Right 1961  . KNEE RECONSTRUCTION Left 1960s - 1974 X 4  . LAPAROSCOPIC ABLATION RENAL MASS  ~ 2014   "large abscess/pus ball"  . [place]they were wedged in [place] bowel"  . PATELLA FRACTURE SURGERY Left 1963  . PATELLA FRACTURE SURGERY Left ~ 1965   "removed knee cap"  . ROBOT ASSISTED LAPAROSCOPIC NEPHRECTOMY Left 10/07/2017   Procedure: XI ROBOTIC ASSISTED LAPAROSCOPIC NEPHRECTOMY OF PELVIC KIDNEY;  Surgeon: ManAlexis FrockD;  [facility];  Service: Urology;  Laterality: Left;  Place robot patient tower at foot of bed like prostate per Dr. ManTresa Mooreull extra staple loads.  . TONSILLECTOMY      There were no vitals filed for this visit.  Subjective Assessment - 09/20/18 1303    Subjective  Pt states continued pain, is less, but still feels very bothersome, daily. He  states, before it was a 11-12/10, now its a 4/10.     Diagnostic tests  MRI:     Currently in Pain?  Yes    Pain Score  4     Pain Location  Shoulder    Pain Orientation  Left    Pain Descriptors / Indicators  Burning;Aching    Pain Type  Acute pain    Pain Onset  More than a month ago    Pain Frequency  Intermittent    Aggravating Factors   lifting, use of L UE.                        [facility] Adult PT Treatment/Exercise - 09/20/18 1306      Exercises   Exercises  Shoulder      Shoulder Exercises: Supine   Horizontal ABduction  15 reps    Horizontal ABduction Weight (lbs)  1    External Rotation  --    External Rotation Limitations  --    Flexion  20 reps;Weights    Shoulder Flexion Weight  (lbs)  1    Flexion Limitations  full ROM    Other Supine Exercises  SA punch with 2 hand hold x20;     Other Supine Exercises  --      Shoulder Exercises: Sidelying   External Rotation  --      Shoulder Exercises: Standing   External Rotation  20 reps    Theraband Level (Shoulder External Rotation)  Level 2 (Red)    Internal Rotation  20 reps    Theraband Level (Shoulder Internal Rotation)  Level 2 (Red)    Row  20 reps    Theraband Level (Shoulder Row)  Level 3 (Green)    Other Standing Exercises  Wall walk on ladder x10;       Shoulder Exercises: Pulleys   Flexion  2 minutes    Scaption  1 minute      Modalities   Modalities  Iontophoresis      Iontophoresis   Type of Iontophoresis  Dexamethasone    Location  L posterior shoulder    Time  6 hr patch      Manual Therapy   Manual Therapy  Joint mobilization;Passive ROM    Joint Mobilization  L GHJ all motions    Passive ROM  L shoulder, all motions               PT Short Term Goals - 08/22/18 1055      PT SHORT TERM GOAL #1   Title  Pt to be independent with initial HEP     Time  2    Period  Weeks    Status  New    Target Date  09/05/18      PT SHORT TERM GOAL #2   Title  Pt to demo improved L shoulder PROM by at least 10 degrees    Time  2    Period  Weeks    Status  New    Target Date  09/05/18      PT SHORT TERM GOAL #3   Title  Pt to report decreased pain in l shoulder to 6/10     Time  2    Period  Weeks    Status  New    Target Date  09/05/18        PT Long Term Goals - 08/22/18 1056  PT LONG TERM GOAL #1   Title  Pt to be independent with final HEP     Time  6    Period  Weeks    Status  New    Target Date  10/03/18      PT LONG TERM GOAL #2   Title  Pt to report decreased pain in L shoulder, to 0-2/10 with activity     Time  6    Period  Weeks    Status  New    Target Date  10/03/18      PT LONG TERM GOAL #3   Title  Pt to demo improved L shoulder AROM to [facility], to  improve ability for ADLs and IADLs.     Time  6    Status  New    Target Date  10/03/18      PT LONG TERM GOAL #4   Title  Pt to demo improved strength of L shoulder to be at least 4+/5 to improve ability for lifting, reaching, carrying, and IADls     Time  6    Period  Weeks    Target Date  10/03/18            Plan - 09/20/18 1353    Clinical Impression Statement  Pt continues to have signfiicant pain in posterior shoulder, increased with flexion and ER. He does have improving ROM and ability for ther ex, but is still having considerable pain. Ionto added for pain today.     Rehab Potential  Good    PT Frequency  2x / week    PT Duration  6 weeks    PT Treatment/Interventions  ADLs/Self Care Home Management;Cryotherapy;Electrical Stimulation;Iontophoresis 59m/ml Dexamethasone;Functional mobility training;Ultrasound;Moist Heat;Therapeutic activities;Therapeutic exercise;Neuromuscular re-education;Patient/family education;Passive range of motion;Manual techniques;Dry needling    Consulted and Agree with Plan of Care  Patient       Patient will benefit from skilled therapeutic intervention in order to improve the following deficits and impairments:  Decreased range of motion, Impaired UE functional use, Increased muscle spasms, Decreased activity tolerance, Pain, Improper body mechanics, Impaired flexibility, Hypomobility, Decreased mobility, Decreased strength  Visit Diagnosis: Acute pain of left shoulder  Stiffness of left shoulder, not elsewhere classified     Problem List Patient Active Problem List   Diagnosis Date Noted  . MGUS (monoclonal gammopathy of unknown significance) 08/02/2018  . Osteoporosis 06/26/2018  . Perennial allergic rhinitis 01/24/2018  . 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 Urinary tract infection 09/03/2017  . Constipation 09/03/2017  . Anxiety 07/24/2017  . Atherosclerosis of native arteries of extremity  with intermittent claudication ([place] 07/06/2017  . Pyelonephritis 04/23/2017  . Weight loss 04/23/2017  . History of adenomatous polyp of colon 04/22/2017  . Aortic atherosclerosis ([place] 04/13/2017  . Hyperlipidemia 04/04/2017  . Hypothyroidism (acquired) 04/04/2017  . Seasonal allergies 04/04/2017  . Myalgia 10/04/2016  . Former smoker 09/01/2016  . COPD, severe ([place] 08/23/2016  . Dysuria 08/23/2016  . Chronic pain of both knees 08/23/2016  . Hx of CABG 08/05/2016  . CAD (coronary artery disease) of artery bypass graft 08/05/2016  . Essential hypertension 08/05/2016  . Essential tremor 06/11/2016  . Tension headache 06/11/2016    LLyndee Hensen PT, DPT 1:57 PM  09/20/18    [facility] [address]Phone: [phone]  Fax:  [phone] Name: WDawsyn RamsaranMRN: [id number] of Birth: 807-07-1951  PHYSICAL THERAPY DISCHARGE SUMMARY  Visits from Start of Care: *6 Pt did not return, after last visit  Plan: Patient agrees to discharge.  Patient goals were partially met. Patient is being discharged due to not returning since the last visit.  ?????     Lyndee Hensen, PT, DPT 12:09 PM  11/10/18

## 2018-09-25 ENCOUNTER — Encounter: Payer: Medicare Other | Admitting: Physical Therapy

## 2018-09-25 ENCOUNTER — Ambulatory Visit: Payer: Medicare Other | Admitting: Pulmonary Disease

## 2018-09-25 ENCOUNTER — Encounter: Payer: Self-pay | Admitting: Pulmonary Disease

## 2018-09-25 VITALS — BP 134/76 | HR 68 | Ht 66.61 in | Wt 155.0 lb

## 2018-09-25 DIAGNOSIS — J449 Chronic obstructive pulmonary disease, unspecified: Secondary | ICD-10-CM

## 2018-09-25 DIAGNOSIS — J301 Allergic rhinitis due to pollen: Secondary | ICD-10-CM | POA: Diagnosis not present

## 2018-09-25 MED ORDER — BUDESONIDE-FORMOTEROL FUMARATE 160-4.5 MCG/ACT IN AERO: INHALATION_SPRAY | RESPIRATORY_TRACT | 3 refills | Status: DC

## 2018-09-25 NOTE — Patient Instructions (Signed)
Severe COPD: Continue Symbicort Continue Spiriva I am glad that your flu shot is up-to-date Practice good hand hygiene Stay active  Allergic rhinitis: First I recommend using saline sprays twice a day for the next 2 to 3 days to see if this helps.  If it does not help then I recommend the following: Try taking over-the-counter cetirizine and nasal fluticasone.  It is important to recognize that nasal fluticasone requires regular, every day use and takes about 2 to 3 weeks to become effective. If this regimen does not work then let me know  Follow up 6 months or sooner if needed

## 2018-09-25 NOTE — Progress Notes (Signed)
Subjective:    Patient ID: Brian Graham, male    DOB: 1951/07/29, 67 y.o.   MRN: [id number]  Synopsis: First referred in 2017 for severe COPD after being followed by the pulmonary group in [place] before moving to [place]. Smoked 40 years, quit in 2012   HPI Chief Complaint  Patient presents with  . Follow-up    reports breathing is "pretty good", postnasal drip    [place] has been doing well since the last visit.  Specifically he says his breathing has not been a problem.  He has not had much problem with cough or mucus production.  He has not had bronchitis or pneumonia since last visit.  He has been dealing with some postnasal drip for the last 3 to 4 days.  He says that this started when the weather changed.  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  Past Medical History:  Diagnosis Date  . Anxiety   . Arthritis    "left knee" (08/05/2016)  . Bruises easily   . CAD (coronary artery disease) of artery bypass graft 08/05/2016   Around age 39. CABG - 3 vessel.   . Chronic kidney disease   . Colon polyp   . COPD, severe ([place]) 08/23/2016   Alpha 1 studies normal per prior pulmonary group notes Smoked 40 years, quit in 2012 June 2016 PFT from prior pulmonary group: "significant obstruction" FEV1 1.58L (47% pred), Residual volume 171% pred, DLCO 59% pred Simple Spirometry>> 09/15/2016 ratio 42% FEV1 1.02 L / 31%   . Essential tremor 06/11/2016   Plans to see Dr. Carles Collet  . Former smoker 09/01/2016   Quit 2012. 40 pack years at least.   . GERD (gastroesophageal reflux disease)   . Headache   . History of blood transfusion 08/1997   "when he had his heart surgery"  . History of shingles 1970-2013 X 3  . HTN (hypertension) 08/05/2016   Amlodipine 5mg , atenolol 50mg  BID, benazepril 20mg   . Hyperlipemia   . Hypothyroidism   . Lumbar disc disease   . Myocardial  infarction ([place]) 08/1997  . Peripheral vascular disease ([place])   . PONV (postoperative nausea and vomiting)   . Urinary tract infection 09/03/2017  . Wears glasses       Review of Systems  Constitutional: Negative for chills, fatigue and fever.  HENT: Negative for nosebleeds, postnasal drip and rhinorrhea.   Respiratory: Positive for cough, shortness of breath and wheezing.   Cardiovascular: Negative for chest pain, palpitations and leg swelling.       Objective:   Physical Exam  Vitals:   09/25/18 1012  BP: 134/76  Pulse: 68  SpO2: 91%  Weight: 155 lb (70.3 kg)  Height: 5' 6.61" (1.692 m)    Gen: well appearing HENT: OP clear, TM's clear, neck supple PULM: CTA B, normal percussion CV: RRR, no mgr, trace edema GI: BS+, soft, nontender Derm: no cyanosis or rash Psyche: normal mood and affect       CBC    Component Value Date/Time   WBC 6.0 05/22/2018 0936   RBC 4.71 05/22/2018 0936   HGB 14.6 05/22/2018 0936   HCT 43.6 05/22/2018 0936   PLT 259.0 05/22/2018 0936   MCV 92.5 05/22/2018 0936   MCH 30.3 10/05/2017 0930   MCHC 33.5 05/22/2018 0936   RDW 14.3 05/22/2018 0936   LYMPHSABS 1.2 12/15/2017 4680  MONOABS 0.8 12/15/2017 0927   EOSABS 0.3 12/15/2017 0927   BASOSABS 0.1 12/15/2017 9470   Labs: Alpha 1 studies normal per prior pulmonary group notes 2014 M-M January 2019 serum IgE elevated at 147 May 2019 serum IgE normal 111  Chest imaging: Images from May 2018 CT chest independently reviewed showing moderate to severe emphysema, airway thickening but no mass or lesion August 2018 chest x-ray images independently reviewed showing emphysema, no mass, no infiltrate  PFT: June 2016 PFT from prior pulmonary group: "significant obstruction" FEV1 1.58L (47% pred), Residual volume 171% pred, DLCO 59% pred Simple Spirometry>> 09/15/2016 ratio 42% FEV1 1.02 L / 31%  Microbiology: January 2019 sputum culture AFB negative, fungus negative, bacteria  negative  Records from his visit with primary care earlier this year reviewed where he was treated with Fosamax for osteoporosis.  There is also note about evaluating his immunoglobulins with a kappa lambda light chain analysis, he later saw Dr. Quintella Reichert with heme-onc because of monoclonal gammopathy of unknown significance      Assessment & Plan:   COPD with asthma ([place])  Allergic rhinitis due to pollen, unspecified seasonality  Discussion: This has been a stable interval for [place]ie.  He has not had an exacerbation of his COPD since the last visit.  He is dealing with some minor allergic rhinitis symptoms right now.  Plan: Severe COPD: Continue Symbicort Continue Spiriva I am glad that your flu shot is up-to-date Practice good hand hygiene Stay active  Allergic rhinitis: First I recommend using saline sprays twice a day for the next 2 to 3 days to see if this helps.  If it does not help then I recommend the following: Try taking over-the-counter cetirizine and nasal fluticasone.  It is important to recognize that nasal fluticasone requires regular, every day use and takes about 2 to 3 weeks to become effective. If this regimen does not work then let me know  Follow up 6 months or sooner if needed   Current Outpatient Medications:  .  acetaminophen (TYLENOL) 500 MG tablet, Take 1,000 mg by mouth every 6 (six) hours as needed for mild pain., Disp: , Rfl:  .  albuterol (PROVENTIL HFA;VENTOLIN HFA) 108 (90 Base) MCG/ACT inhaler, Inhale 2 puffs into the lungs every 6 (six) hours as needed for wheezing or shortness of breath., Disp: 1 Inhaler, Rfl: 5 .  albuterol (PROVENTIL) (2.5 MG/3ML) 0.083% nebulizer solution, Take 3 mLs (2.5 mg total) by nebulization every 6 (six) hours as needed for wheezing or shortness of breath. DX: J44.9, Disp: 300 mL, Rfl: 11 .  alendronate (FOSAMAX) 70 MG tablet, Take 1 tablet (70 mg total) by mouth every 7 (seven) days. Take with a full glass of water on an  empty stomach., Disp: 13 tablet, Rfl: 3 .  amLODipine (NORVASC) 5 MG tablet, TAKE 1 TABLET DAILY, Disp: 90 tablet, Rfl: 1 .  atenolol (TENORMIN) 50 MG tablet, TAKE 1 TABLET DAILY, Disp: 90 tablet, Rfl: 3 .  azelastine (ASTELIN) 0.1 % nasal spray, Place 2 sprays into both nostrils 2 (two) times daily. Use in each nostril as directed, Disp: 30 mL, Rfl: 5 .  benazepril (LOTENSIN) 20 MG tablet, TAKE 1 TABLET DAILY, Disp: 90 tablet, Rfl: 4 .  budesonide-formoterol (SYMBICORT) 160-4.5 MCG/ACT inhaler, USE 2 INHALATIONS TWICE A DAY, Disp: 3 Inhaler, Rfl: 3 .  Misc. Devices (PULSE OXIMETER) MISC, 1 application by Does not apply route as needed., Disp: 1 each, Rfl: 0 .  nitroGLYCERIN (NITRODUR - DOSED IN  MG/24 HR) 0.2 mg/hr patch, Place 1/4 to 1/2 of a patch over affected region. Remove and replace once daily.  Slightly alter skin placement daily, Disp: 30 patch, Rfl: 1 .  ondansetron (ZOFRAN-ODT) 4 MG disintegrating tablet, PLACE 1 TO 2 TABLETS ON TONGUE AND ALLOW TO DISSOLVE EVERY 8 HOURS AS NEEDED FOR NAUSEA / VOMITING, Disp: 30 tablet, Rfl: 0 .  rosuvastatin (CRESTOR) 40 MG tablet, Take 1 tablet (40 mg total) by mouth daily., Disp: 90 tablet, Rfl: 3 .  SPIRIVA RESPIMAT 2.5 MCG/ACT AERS, USE 2 INHALATIONS DAILY, Disp: 12 g, Rfl: 3 .  SYNTHROID 125 MCG tablet, Take 1 tablet (125 mcg total) by mouth daily before breakfast., Disp: 90 tablet, Rfl: 3 .  traMADol (ULTRAM) 50 MG tablet, Take 1 tablet (50 mg total) by mouth every 6 (six) hours as needed for moderate pain or severe pain., Disp: 20 tablet, Rfl: 0

## 2018-09-26 ENCOUNTER — Ambulatory Visit: Payer: Medicare Other | Admitting: Sports Medicine

## 2018-09-27 ENCOUNTER — Encounter: Payer: Medicare Other | Admitting: Physical Therapy

## 2018-10-03 ENCOUNTER — Other Ambulatory Visit: Payer: Self-pay | Admitting: Family Medicine

## 2018-10-03 MED ORDER — TRAMADOL HCL 50 MG PO TABS: 50.0000 mg | ORAL_TABLET | Freq: Four times a day (QID) | ORAL | 0 refills | Status: DC | PRN

## 2018-10-06 ENCOUNTER — Ambulatory Visit: Payer: Medicare Other | Admitting: Pulmonary Disease

## 2018-10-13 ENCOUNTER — Telehealth: Payer: Self-pay | Admitting: Family Medicine

## 2018-10-13 NOTE — Telephone Encounter (Signed)
See note  Copied from [place] [phone]. Topic: Appointment Scheduling - Scheduling Inquiry for Clinic >> Oct 13, 2018 10:58 AM Lennox Solders wrote: Reason for CRM: pt is still having left shoulder pain and is requesting to see dr hunter not dr Paulla Fore. Pt would like to work in

## 2018-10-17 ENCOUNTER — Other Ambulatory Visit: Payer: Self-pay

## 2018-10-17 DIAGNOSIS — M25512 Pain in left shoulder: Secondary | ICD-10-CM

## 2018-10-17 NOTE — Telephone Encounter (Signed)
Yes thanks-may refer to orthopedics- does he not want to see Dr. Paulla Fore?  Can refer to sports medicine if he wants to see Dr. Festus Aloe orthopedics is fine

## 2018-10-17 NOTE — Telephone Encounter (Signed)
Called and spoke to patient who is requesting a referral to Orthopedics for his shoulder pain. OK to enter referral?

## 2018-10-17 NOTE — Telephone Encounter (Signed)
Referral placed to Orthopedics. Patient has already seen Dr. Paulla Fore and has been doing PT. He states pain is not improving

## 2018-10-23 ENCOUNTER — Encounter (INDEPENDENT_AMBULATORY_CARE_PROVIDER_SITE_OTHER): Payer: Self-pay | Admitting: Orthopaedic Surgery

## 2018-10-23 ENCOUNTER — Ambulatory Visit (INDEPENDENT_AMBULATORY_CARE_PROVIDER_SITE_OTHER): Payer: Medicare Other | Admitting: Orthopaedic Surgery

## 2018-10-23 VITALS — BP 123/71 | HR 70 | Resp 16 | Ht 69.0 in | Wt 155.0 lb

## 2018-10-23 DIAGNOSIS — G8929 Other chronic pain: Secondary | ICD-10-CM

## 2018-10-23 DIAGNOSIS — M25512 Pain in left shoulder: Secondary | ICD-10-CM | POA: Diagnosis not present

## 2018-10-23 NOTE — Progress Notes (Signed)
Office Visit Note   Patient: Brian Graham           Date of Birth: [birth year]           MRN: [id number] Visit Date: 10/23/2018              Requested by: [name], MD [place], [place] [id number] PCP: [name], MD   Assessment & Plan: Visit Diagnoses:  1. Chronic left shoulder pain     Plan: Adhesive capsulitis left shoulder.  Long discussion with Mr. and Mrs. Lumm regarding the present status of his sh discussion oulder.  I would suggest manipulation.  He would like to proceed.  Discussion over 45 minutes 50% of time in counseling Follow-Up Instructions: Return will schedule shoulder manipulation.   Orders:  No orders of the defined types were placed in this encounter.  No orders of the defined types were placed in this encounter.     Procedures: No procedures performed   Clinical Data: No additional findings.   Subjec.tive: Chief Complaint  Patient presents with  . Left Shoulder - Pain  . Shoulder Pain    Left shoudler pain, fell down stairs 04/2018, limited range of motion, numbness, tingling, Tramdol helps some, weakness, difficulty sleeping at night  Brian Graham is 67 years old and fell down the stairs this year.  He had initial onset of left shoulder pain to the point he was having difficulty raising his arm over his head.  Dr. Paulla Fore subsequently ordered an MRI scan of his shoulder after he had a poor response to cortisone injection, time of therapy.  The scan demonstrated mild tendinosis of the supra and infraspinatus tendons without a tear. acromioclavicular joint arthritis.  He continues to have pain to the point of compromise.  No numbness or tingling.   He was diagnosed with a monoclonal gammopathy of undetermined significance and has been followed by oncology.  Prior history of 40 years smoking and quit in 2012  HPI  Review of Systems   Objective: Vital Signs: BP 123/71 (BP Location: Left Arm, Patient Position:  Sitting, Cuff Size: Normal)   Pulse 70   Resp 16   Ht 5\' 9"  (1.753 m)   Wt 155 lb (70.3 kg)   BMI 22.89 kg/m   Physical Exam  Constitutional: He is oriented to person, place, and time. He appears well-developed and well-nourished.  HENT:  Mouth/Throat: Oropharynx is clear and moist.  Eyes: Pupils are equal, round, and reactive to light. EOM are normal.  Pulmonary/Chest: Effort normal.  Neurological: He is alert and oriented to person, place, and time.  Skin: Skin is warm and dry.  Psychiatric: He has a normal mood and affect. His behavior is normal.    Ortho Exam awake alert and oriented x3.  Comfortable sitting.  Significant limitation of motion left shoulder with only about 80 degrees of abduction and 90 degrees of flexion.  Internal rotation was also significantly compromised.  No pain with his arm at neutral position.  Good grip and good release.  No pain at the acromioclavicular joint or the anterior subacromial region Specialty Comments:  No specialty comments available.  Imaging: No results found.   PMFS History: Patient Active Problem List   Diagnosis Date Noted  . MGUS (monoclonal gammopathy of unknown significance) 08/02/2018  . Osteoporosis 06/26/2018  . Perennial allergic rhinitis 01/24/2018  . 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 Urinary tract infection 09/03/2017  .  Constipation 09/03/2017  . Anxiety 07/24/2017  . Atherosclerosis of native arteries of extremity with intermittent claudication ([place]) 07/06/2017  . Pyelonephritis 04/23/2017  . Weight loss 04/23/2017  . History of adenomatous polyp of colon 04/22/2017  . Aortic atherosclerosis ([place]) 04/13/2017  . Hyperlipidemia 04/04/2017  . Hypothyroidism (acquired) 04/04/2017  . Seasonal allergies 04/04/2017  . Myalgia 10/04/2016  . Former smoker 09/01/2016  . COPD, severe ([place]) 08/23/2016  . Dysuria 08/23/2016  . Chronic pain of both knees 08/23/2016  . Hx of CABG  08/05/2016  . CAD (coronary artery disease) of artery bypass graft 08/05/2016  . Essential hypertension 08/05/2016  . Essential tremor 06/11/2016  . Tension headache 06/11/2016   Past Medical History:  Diagnosis Date  . Anxiety   . Arthritis    "left knee" (08/05/2016)  . Bruises easily   . CAD (coronary artery disease) of artery bypass graft 08/05/2016   Around age 47. CABG - 3 vessel.   . Chronic kidney disease   . Colon polyp   . COPD, severe ([place]) 08/23/2016   Alpha 1 studies normal per prior pulmonary group notes Smoked 40 years, quit in 2012 June 2016 PFT from prior pulmonary group: "significant obstruction" FEV1 1.58L (47% pred), Residual volume 171% pred, DLCO 59% pred Simple Spirometry>> 09/15/2016 ratio 42% FEV1 1.02 L / 31%   . Essential tremor 06/11/2016   Plans to see Dr. Carles Collet  . Former smoker 09/01/2016   Quit 2012. 40 pack years at least.   . GERD (gastroesophageal reflux disease)   . Headache   . History of blood transfusion 08/1997   "when he had his heart surgery"  . History of shingles 1970-2013 X 3  . HTN (hypertension) 08/05/2016   Amlodipine 5mg , atenolol 50mg  BID, benazepril 20mg   . Hyperlipemia   . Hypothyroidism   . Lumbar disc disease   . Myocardial infarction ([place]) 08/1997  . Peripheral vascular disease ([place])   . PONV (postoperative nausea and vomiting)   . Urinary tract infection 09/03/2017  . Wears glasses     Family History  Problem Relation Age of Onset  . Alcohol abuse Mother   . Alcohol abuse Father        not involved in life  . Alcoholism Sister   . Healthy Sister   . Healthy Sister   . Allergic rhinitis Neg Hx   . Angioedema Neg Hx   . Asthma Neg Hx   . Eczema Neg Hx   . Immunodeficiency Neg Hx   . Urticaria Neg Hx     Past Surgical History:  Procedure Laterality Date  . AORTOGRAM Right 08/31/2017   Procedure: AORTOGRAM BILATERAL PELVIC ANGIOGRAM WITH LEFT ILIAC ARTERY STENT;  Surgeon: Conrad Massapequa, MD;  Location: [facility];   Service: Vascular;  Laterality: Right;  . BACK SURGERY    . CARDIAC CATHETERIZATION  08/1997  . CORONARY ARTERY BYPASS GRAFT  09/12/1997   "triple"  . INGUINAL HERNIA REPAIR Right 1961  . KIDNEY REMOVED    . KNEE RECONSTRUCTION Left 1960s - 1974 X 4  . LAPAROSCOPIC ABLATION RENAL MASS  ~ 2014   "large abscess/pus ball"  . [place]   "they were wedged in the bowel"  . PATELLA FRACTURE SURGERY Left 1963  . PATELLA FRACTURE SURGERY Left ~ 1965   "removed knee cap"  . ROBOT ASSISTED LAPAROSCOPIC NEPHRECTOMY Left 10/07/2017   Procedure: XI ROBOTIC ASSISTED LAPAROSCOPIC NEPHRECTOMY OF PELVIC KIDNEY;  Surgeon: Alexis Frock, MD;  Location:  [facility];  Service: Urology;  Laterality: Left;  Place robot patient tower at foot of bed like prostate per Dr. Tresa Moore. Pull extra staple loads.  . TONSILLECTOMY     Social History   Occupational History  . Not on file  Tobacco Use  . Smoking status: Former Smoker    Packs/day: 1.00    Years: 45.00    Pack years: 45.00    Types: Cigarettes    Last attempt to quit: 07/10/2017    Years since quitting: 1.2  . Smokeless tobacco: Never Used  Substance and Sexual Activity  . Alcohol use: Yes    Alcohol/week: 14.0 standard drinks    Types: 14 Shots of liquor per week    Comment: daily 2 shots of vodka  . Drug use: No  . Sexual activity: Not Currently     Garald Balding, MD   Note - This record has been created using [employer].  Chart creation errors have been sought, but may not always  have been located. Such creation errors do not reflect on  the standard of medical care.

## 2018-10-23 NOTE — Progress Notes (Deleted)
Office Visit Note   Patient: Brian Graham           Date of Birth: Apr 19, 1951           MRN: [id number] Visit Date: 10/23/2018              Requested by: [name], MD [place], [place] [id number] PCP: [name], MD   Assessment & Plan: Visit Diagnoses: No diagnosis found.  Plan: ***  Follow-Up Instructions: No follow-ups on file.   Orders:  No orders of the defined types were placed in this encounter.  No orders of the defined types were placed in this encounter.     Procedures: No procedures performed   Clinical Data: No additional findings.   Subjective: Chief Complaint  Patient presents with  . Left Shoulder - Pain  . Shoulder Pain    Left shoudler pain, fell down stairs 04/2018, limited range of motion, numbness, tingling, Tramdol helps some    HPI  Review of Systems  Constitutional: Negative for fatigue.  HENT: Negative for trouble swallowing.   Eyes: Negative for pain.  Respiratory: Negative for shortness of breath.   Cardiovascular: Negative for leg swelling.  Gastrointestinal: Positive for constipation.  Endocrine: Negative for cold intolerance.  Genitourinary: Negative for difficulty urinating.  Musculoskeletal: Positive for neck pain and neck stiffness.  Skin: Negative for rash.  Allergic/Immunologic: Negative for food allergies.  Neurological: Positive for weakness and numbness.  Hematological: Does not bruise/bleed easily.  Psychiatric/Behavioral: Positive for sleep disturbance.     Objective: Vital Signs: BP 123/71 (BP Location: Left Arm, Patient Position: Sitting, Cuff Size: Normal)   Pulse 70   Resp 16   Ht 5\' 9"  (1.753 m)   Wt 155 lb (70.3 kg)   BMI 22.89 kg/m   Physical Exam  Ortho Exam  Specialty Comments:  No specialty comments available.  Imaging: No results found.   PMFS History: Patient Active Problem List   Diagnosis Date Noted  . MGUS (monoclonal gammopathy of unknown  significance) 08/02/2018  . Osteoporosis 06/26/2018  . Perennial allergic rhinitis 01/24/2018  . 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 Urinary tract infection 09/03/2017  . Constipation 09/03/2017  . Anxiety 07/24/2017  . Atherosclerosis of native arteries of extremity with intermittent claudication ([place]) 07/06/2017  . Pyelonephritis 04/23/2017  . Weight loss 04/23/2017  . History of adenomatous polyp of colon 04/22/2017  . Aortic atherosclerosis ([place]) 04/13/2017  . Hyperlipidemia 04/04/2017  . Hypothyroidism (acquired) 04/04/2017  . Seasonal allergies 04/04/2017  . Myalgia 10/04/2016  . Former smoker 09/01/2016  . COPD, severe ([place]) 08/23/2016  . Dysuria 08/23/2016  . Chronic pain of both knees 08/23/2016  . Hx of CABG 08/05/2016  . CAD (coronary artery disease) of artery bypass graft 08/05/2016  . Essential hypertension 08/05/2016  . Essential tremor 06/11/2016  . Tension headache 06/11/2016   Past Medical History:  Diagnosis Date  . Anxiety   . Arthritis    "left knee" (08/05/2016)  . Bruises easily   . CAD (coronary artery disease) of artery bypass graft 08/05/2016   Around age 15. CABG - 3 vessel.   . Chronic kidney disease   . Colon polyp   . COPD, severe ([place]) 08/23/2016   Alpha 1 studies normal per prior pulmonary group notes Smoked 40 years, quit in 2012 June 2016 PFT from prior pulmonary group: "significant obstruction" FEV1 1.58L (47% pred), Residual volume 171% pred, DLCO 59%  pred Simple Spirometry>> 09/15/2016 ratio 42% FEV1 1.02 L / 31%   . Essential tremor 06/11/2016   Plans to see Dr. Carles Collet  . Former smoker 09/01/2016   Quit 2012. 40 pack years at least.   . GERD (gastroesophageal reflux disease)   . Headache   . History of blood transfusion 08/1997   "when he had his heart surgery"  . History of shingles 1970-2013 X 3  . HTN (hypertension) 08/05/2016   Amlodipine 5mg , atenolol 50mg  BID, benazepril 20mg   . Hyperlipemia     . Hypothyroidism   . Lumbar disc disease   . Myocardial infarction ([place]) 08/1997  . Peripheral vascular disease ([place])   . PONV (postoperative nausea and vomiting)   . Urinary tract infection 09/03/2017  . Wears glasses     Family History  Problem Relation Age of Onset  . Alcohol abuse Mother   . Alcohol abuse Father        not involved in life  . Alcoholism Sister   . Healthy Sister   . Healthy Sister   . Allergic rhinitis Neg Hx   . Angioedema Neg Hx   . Asthma Neg Hx   . Eczema Neg Hx   . Immunodeficiency Neg Hx   . Urticaria Neg Hx     Past Surgical History:  Procedure Laterality Date  . AORTOGRAM Right 08/31/2017   Procedure: AORTOGRAM BILATERAL PELVIC ANGIOGRAM WITH LEFT ILIAC ARTERY STENT;  Surgeon: Conrad Buckholts, MD;  Location: [facility];  Service: Vascular;  Laterality: Right;  . BACK SURGERY    . CARDIAC CATHETERIZATION  08/1997  . CORONARY ARTERY BYPASS GRAFT  09/12/1997   "triple"  . INGUINAL HERNIA REPAIR Right 1961  . KIDNEY REMOVED    . KNEE RECONSTRUCTION Left 1960s - 1974 X 4  . LAPAROSCOPIC ABLATION RENAL MASS  ~ 2014   "large abscess/pus ball"  . [place]   "they were wedged in the bowel"  . PATELLA FRACTURE SURGERY Left 1963  . PATELLA FRACTURE SURGERY Left ~ 1965   "removed knee cap"  . ROBOT ASSISTED LAPAROSCOPIC NEPHRECTOMY Left 10/07/2017   Procedure: XI ROBOTIC ASSISTED LAPAROSCOPIC NEPHRECTOMY OF PELVIC KIDNEY;  Surgeon: Alexis Frock, MD;  Location: [facility];  Service: Urology;  Laterality: Left;  Place robot patient tower at foot of bed like prostate per Dr. Tresa Moore. Pull extra staple loads.  . TONSILLECTOMY     Social History   Occupational History  . Not on file  Tobacco Use  . Smoking status: Former Smoker    Packs/day: 1.00    Years: 45.00    Pack years: 45.00    Types: Cigarettes    Last attempt to quit: 07/10/2017    Years since quitting: 1.2  . Smokeless tobacco: Never Used  Substance and Sexual Activity  .  Alcohol use: Yes    Alcohol/week: 14.0 standard drinks    Types: 14 Shots of liquor per week    Comment: daily 2 shots of vodka  . Drug use: No  . Sexual activity: Not Currently

## 2018-11-03 ENCOUNTER — Telehealth (INDEPENDENT_AMBULATORY_CARE_PROVIDER_SITE_OTHER): Payer: Self-pay | Admitting: Orthopaedic Surgery

## 2018-11-03 NOTE — Telephone Encounter (Signed)
Left message on voice mail providing patient with name and direct number for scheduling left shoulder manipulation with Dr. Durward Fortes.

## 2018-11-05 ENCOUNTER — Other Ambulatory Visit: Payer: Self-pay | Admitting: Family Medicine

## 2018-11-06 NOTE — Telephone Encounter (Signed)
Last OV 08/16/2018 Last refill 10/03/18 #30/0 Next OV with ortho 11/10/2018  Forwarding to Dr. Paulla Fore.

## 2018-11-07 ENCOUNTER — Other Ambulatory Visit: Payer: Self-pay | Admitting: Family Medicine

## 2018-11-07 MED ORDER — TRAMADOL HCL 50 MG PO TABS: 50.0000 mg | ORAL_TABLET | Freq: Four times a day (QID) | ORAL | 5 refills | Status: DC | PRN

## 2018-11-07 NOTE — Telephone Encounter (Signed)
Last OV 07/18/18 Last refill 10/03/18 #30/0 Next OV - non scheduled.   Forwarding to Dr. [name] to advise.

## 2018-11-09 DIAGNOSIS — M7502 Adhesive capsulitis of left shoulder: Secondary | ICD-10-CM

## 2018-11-10 ENCOUNTER — Encounter: Payer: Self-pay | Admitting: Physical Therapy

## 2018-11-10 ENCOUNTER — Ambulatory Visit (INDEPENDENT_AMBULATORY_CARE_PROVIDER_SITE_OTHER): Payer: Medicare Other | Admitting: Physical Therapy

## 2018-11-10 DIAGNOSIS — G8929 Other chronic pain: Secondary | ICD-10-CM | POA: Diagnosis not present

## 2018-11-10 DIAGNOSIS — M25612 Stiffness of left shoulder, not elsewhere classified: Secondary | ICD-10-CM | POA: Diagnosis not present

## 2018-11-10 DIAGNOSIS — M25512 Pain in left shoulder: Secondary | ICD-10-CM | POA: Diagnosis not present

## 2018-11-10 NOTE — Patient Instructions (Signed)
Access Code: [email or web address]  URL: [email or web address]  Date: 11/10/2018  Prepared by: Lyndee Hensen   Exercises  Supine Shoulder Flexion with Dowel - 10 reps - 1 sets - 5 hold - 5x daily  Supine Shoulder External Rotation with Dowel - 10 reps - 1 sets - 5 hold - 5x daily  Standing Shoulder External Rotation AAROM with Dowel - 10 reps - 1 sets - 5 hold - 5x daily  Seated Shoulder Flexion AAROM with Pulley Behind - 10 reps - 2 sets - 5x daily  Standing Shoulder Flexion Wall Walk - 10 reps - 1 sets - 5x daily  Standing Shoulder Internal Rotation Stretch with Hands Behind Back - 10 reps - 1 sets - 5x daily  Standing Shoulder Internal Rotation Stretch with Towel - 10 reps - 1 sets - 5 hold - 5x daily

## 2018-11-10 NOTE — Therapy (Signed)
[facility] [address] [place], [place], [address] Phone: [phone]   Fax:  [phone]  Physical Therapy Evaluation  Patient Details  Name: Brian Graham MRN: [id number] Date of Birth: [birth year] Referring Provider (PT): Durward Fortes   Encounter Date: 11/10/2018  PT End of Session - 11/10/18 0939    Visit Number  1    Number of Visits  12    Date for PT Re-Evaluation  12/22/18    Authorization Type  [employer] medicare    PT Start Time  0930    PT Stop Time  1012    PT Time Calculation (min)  42 min    Activity Tolerance  Patient tolerated treatment well;Patient limited by pain    Behavior During Therapy  [facility] for tasks assessed/performed       Past Medical History:  Diagnosis Date  . Anxiety   . Arthritis    "left knee" (08/05/2016)  . Bruises easily   . CAD (coronary artery disease) of artery bypass graft 08/05/2016   Around age 46. CABG - 3 vessel.   . Chronic kidney disease   . Colon polyp   . COPD, severe ([place]) 08/23/2016   Alpha 1 studies normal per prior pulmonary group notes Smoked 40 years, quit in 2012 June 2016 PFT from prior pulmonary group: "significant obstruction" FEV1 1.58L (47% pred), Residual volume 171% pred, DLCO 59% pred Simple Spirometry>> 09/15/2016 ratio 42% FEV1 1.02 L / 31%   . Essential tremor 06/11/2016   Plans to see Dr. Carles Collet  . Former smoker 09/01/2016   Quit 2012. 40 pack years at least.   . GERD (gastroesophageal reflux disease)   . Headache   . History of blood transfusion 08/1997   "when he had his heart surgery"  . History of shingles 1970-2013 X 3  . HTN (hypertension) 08/05/2016   Amlodipine 5mg , atenolol 50mg  BID, benazepril 20mg   . Hyperlipemia   . Hypothyroidism   . Lumbar disc disease   . Myocardial infarction ([place]) 08/1997  . Peripheral vascular disease ([place])   . PONV (postoperative nausea and vomiting)   . Urinary tract infection 09/03/2017  . Wears glasses     Past Surgical  History:  Procedure Laterality Date  . AORTOGRAM Right 08/31/2017   Procedure: AORTOGRAM BILATERAL PELVIC ANGIOGRAM WITH LEFT ILIAC ARTERY STENT;  Surgeon: Conrad [place], MD;  Location: [facility];  Service: Vascular;  Laterality: Right;  . BACK SURGERY    . CARDIAC CATHETERIZATION  08/1997  . CORONARY ARTERY BYPASS GRAFT  09/12/1997   "triple"  . INGUINAL HERNIA REPAIR Right 1961  . KIDNEY REMOVED    . KNEE RECONSTRUCTION Left 1960s - 1974 X 4  . LAPAROSCOPIC ABLATION RENAL MASS  ~ 2014   "large abscess/pus ball"  . [place]   "they were wedged in the bowel"  . PATELLA FRACTURE SURGERY Left 1963  . PATELLA FRACTURE SURGERY Left ~ 1965   "removed knee cap"  . ROBOT ASSISTED LAPAROSCOPIC NEPHRECTOMY Left 10/07/2017   Procedure: XI ROBOTIC ASSISTED LAPAROSCOPIC NEPHRECTOMY OF PELVIC KIDNEY;  Surgeon: Alexis Frock, MD;  Location: [facility];  Service: Urology;  Laterality: Left;  Place robot patient tower at foot of bed like prostate per Dr. Tresa Moore. Pull extra staple loads.  . TONSILLECTOMY      There were no vitals filed for this visit.   Subjective Assessment - 11/10/18 0936    Subjective  Pt had manipulation under anesthesia on 12/19.  Pt was seen previously in PT for same shoulder. Last seen 10/30.     Limitations  Lifting;House hold activities    Currently in Pain?  Yes    Pain Score  7     Pain Orientation  Left    Pain Descriptors / Indicators  Aching;Sore    Pain Type  Acute pain    Pain Onset  More than a month ago    Pain Frequency  Intermittent    Aggravating Factors   Lifting, use of L UE    Pain Relieving Factors  Rest, ice         [facility] PT Assessment - 11/10/18 0940      Assessment   Medical Diagnosis  L shoulder Pain/ S/P manipulation    Onset Date/Surgical Date  11/09/18    Prior Therapy  yes      Precautions   Precautions  None      Balance Screen   Has the patient fallen in the past 6 months  No      Prior Function   Level of  Independence  Independent      Cognition   Overall Cognitive Status  Within Functional Limits for tasks assessed      ROM / Strength   AROM / PROM / Strength  AROM;PROM;Strength      AROM   Left Shoulder Flexion  120 Degrees    Left Shoulder ABduction  100 Degrees    Left Shoulder Internal Rotation  55 Degrees    Left Shoulder External Rotation  45 Degrees      PROM   Left Shoulder Flexion  140 Degrees    Left Shoulder ABduction  150 Degrees    Left Shoulder Internal Rotation  55 Degrees    Left Shoulder External Rotation  45 Degrees      Strength   Left Shoulder Flexion  3-/5    Left Shoulder ABduction  3-/5    Left Shoulder Internal Rotation  4/5    Left Shoulder External Rotation  4-/5      Palpation   Palpation comment  Hypomobile  L GHJ                Objective measurements completed on examination: See above findings.      [facility] Adult PT Treatment/Exercise - 11/10/18 0001      Shoulder Exercises: Supine   External Rotation  AAROM;20 reps    External Rotation Limitations  cane    Flexion  20 reps;AAROM    Flexion Limitations  cane      Shoulder Exercises: Seated   External Rotation  20 reps    External Rotation Limitations  cane      Shoulder Exercises: Pulleys   Flexion  2 minutes      Shoulder Exercises: Stretch   Internal Rotation Stretch  5 reps    Internal Rotation Stretch Limitations  behind the back stretch      Manual Therapy   Joint Mobilization  L GHJ all motions    Passive ROM  L shoulder, all motions             PT Education - 11/10/18 1308    Education Details  PT POC, HEP, Frequency of HEP.     Person(s) Educated  Patient;Spouse    Methods  Explanation;Demonstration;Handout    Comprehension  Verbalized understanding;Tactile cues required;Need further instruction       PT Short Term Goals - 11/10/18 1210  PT SHORT TERM GOAL #1   Title  Pt to be independent with initial HEP     Time  2    Period  Weeks     Status  New    Target Date  11/24/18      PT SHORT TERM GOAL #2   Title  Pt to demo improved L shoulder PROM by at least 10 degrees for flexion and ER.    Time  3    Period  Weeks    Status  New    Target Date  12/01/18        PT Long Term Goals - 11/10/18 1210      PT LONG TERM GOAL #1   Title  Pt to be independent with final HEP     Time  6    Period  Weeks    Status  New    Target Date  12/22/18      PT LONG TERM GOAL #2   Title  Pt to report decreased pain in L shoulder, to 0-2/10 with activity     Time  6    Period  Weeks    Status  New    Target Date  12/22/18      PT LONG TERM GOAL #3   Title  Pt to demo improved L shoulder AROM to [facility], to improve ability for ADLs and IADLs.     Time  6    Period  Weeks    Status  New    Target Date  12/22/18      PT LONG TERM GOAL #4   Title  Pt to demo improved strength of L shoulder to be at least 4+/5 to improve ability for lifting, reaching, carrying, and IADls     Time  6    Period  Weeks    Status  New    Target Date  12/22/18             Plan - 11/10/18 1212    Clinical Impression Statement  Pt with new procedure, manipulation for L shoulder Adhesive Capsulitis, on 11/09/18. Pt with stiffness and hypomobile L GHJ. Pt with decreased PROM from joint stiffness, and decreased AROM from muscle weakness. Pt with significant decrease in ability for functional activities, due to pain and deficits. Pt educated at length today on need for very frequent ROM at home with HEP. HEP reviewed today. Pt will attend 3x/wk next 2 weeks, as schedule will allow with Christmas holiday. Pt to benefit from skilled PT to improve ROM, strength, and pain.     Clinical Presentation  Stable    Clinical Decision Making  Low    Rehab Potential  Good    Clinical Impairments Affecting Rehab Potential  Pt to be seen at 3x/wk due to need for high frequency ROM following manipulation.     PT Frequency  3x / week    PT Duration  6 weeks    PT  Treatment/Interventions  ADLs/Self Care Home Management;Cryotherapy;Electrical Stimulation;Iontophoresis 4mg /ml Dexamethasone;Functional mobility training;Ultrasound;Moist Heat;Therapeutic activities;Therapeutic exercise;Neuromuscular re-education;Patient/family education;Passive range of motion;Manual techniques;Dry needling    Consulted and Agree with Plan of Care  Patient       Patient will benefit from skilled therapeutic intervention in order to improve the following deficits and impairments:  Decreased range of motion, Impaired UE functional use, Increased muscle spasms, Decreased activity tolerance, Pain, Improper body mechanics, Impaired flexibility, Hypomobility, Decreased mobility, Decreased strength  Visit Diagnosis: Chronic left shoulder pain  Stiffness of left shoulder, not elsewhere classified     Problem List Patient Active Problem List   Diagnosis Date Noted  . MGUS (monoclonal gammopathy of unknown significance) 08/02/2018  . Osteoporosis 06/26/2018  . Perennial allergic rhinitis 01/24/2018  . 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 Urinary tract infection 09/03/2017  . Constipation 09/03/2017  . Anxiety 07/24/2017  . Atherosclerosis of native arteries of extremity with intermittent claudication ([place]) 07/06/2017  . Pyelonephritis 04/23/2017  . Weight loss 04/23/2017  . History of adenomatous polyp of colon 04/22/2017  . Aortic atherosclerosis ([place]) 04/13/2017  . Hyperlipidemia 04/04/2017  . Hypothyroidism (acquired) 04/04/2017  . Seasonal allergies 04/04/2017  . Myalgia 10/04/2016  . Former smoker 09/01/2016  . COPD, severe ([place]) 08/23/2016  . Dysuria 08/23/2016  . Chronic pain of both knees 08/23/2016  . Hx of CABG 08/05/2016  . CAD (coronary artery disease) of artery bypass graft 08/05/2016  . Essential hypertension 08/05/2016  . Essential tremor 06/11/2016  . Tension headache 06/11/2016    Lyndee Hensen, PT,  DPT 12:24 PM  11/10/18    [facility] [place], [place], [address] Phone: [phone]   Fax:  [phone]  Name: Brian Graham MRN: [id number] Date of Birth: 07-30-1951

## 2018-11-13 ENCOUNTER — Encounter: Payer: Self-pay | Admitting: Physical Therapy

## 2018-11-13 ENCOUNTER — Ambulatory Visit (INDEPENDENT_AMBULATORY_CARE_PROVIDER_SITE_OTHER): Payer: Medicare Other | Admitting: Physical Therapy

## 2018-11-13 DIAGNOSIS — M25612 Stiffness of left shoulder, not elsewhere classified: Secondary | ICD-10-CM | POA: Diagnosis not present

## 2018-11-13 DIAGNOSIS — M25512 Pain in left shoulder: Secondary | ICD-10-CM

## 2018-11-13 DIAGNOSIS — G8929 Other chronic pain: Secondary | ICD-10-CM | POA: Diagnosis not present

## 2018-11-13 NOTE — Therapy (Signed)
[facility] [address] [place], [place], [address] Phone: [phone]   Fax:  [phone]  Physical Therapy Treatment  Patient Details  Name: Brian Graham MRN: [id number] Date of Birth: 02/14/1951 Referring Provider (PT): Teresa Coombs   Encounter Date: 11/13/2018  PT End of Session - 11/13/18 1406    Visit Number  2    Number of Visits  12    Date for PT Re-Evaluation  12/22/18    Authorization Type  [employer] medicare    PT Start Time  1109    PT Stop Time  1148    PT Time Calculation (min)  39 min    Activity Tolerance  Patient tolerated treatment well;Patient limited by pain    Behavior During Therapy  [facility] for tasks assessed/performed       Past Medical History:  Diagnosis Date  . Anxiety   . Arthritis    "left knee" (08/05/2016)  . Bruises easily   . CAD (coronary artery disease) of artery bypass graft 08/05/2016   Around age 67. CABG - 3 vessel.   . Chronic kidney disease   . Colon polyp   . COPD, severe ([place]) 08/23/2016   Alpha 1 studies normal per prior pulmonary group notes Smoked 40 years, quit in 2012 June 2016 PFT from prior pulmonary group: "significant obstruction" FEV1 1.58L (47% pred), Residual volume 171% pred, DLCO 59% pred Simple Spirometry>> 09/15/2016 ratio 42% FEV1 1.02 L / 31%   . Essential tremor 06/11/2016   Plans to see Dr. Carles Collet  . Former smoker 09/01/2016   Quit 2012. 40 pack years at least.   . GERD (gastroesophageal reflux disease)   . Headache   . History of blood transfusion 08/1997   "when he had his heart surgery"  . History of shingles 1970-2013 X 3  . HTN (hypertension) 08/05/2016   Amlodipine 5mg , atenolol 50mg  BID, benazepril 20mg   . Hyperlipemia   . Hypothyroidism   . Lumbar disc disease   . Myocardial infarction ([place]) 08/1997  . Peripheral vascular disease ([place])   . PONV (postoperative nausea and vomiting)   . Urinary tract infection 09/03/2017  . Wears glasses     Past  Surgical History:  Procedure Laterality Date  . AORTOGRAM Right 08/31/2017   Procedure: AORTOGRAM BILATERAL PELVIC ANGIOGRAM WITH LEFT ILIAC ARTERY STENT;  Surgeon: Conrad [place], MD;  Location: [facility];  Service: Vascular;  Laterality: Right;  . BACK SURGERY    . CARDIAC CATHETERIZATION  08/1997  . CORONARY ARTERY BYPASS GRAFT  09/12/1997   "triple"  . INGUINAL HERNIA REPAIR Right 1961  . KIDNEY REMOVED    . KNEE RECONSTRUCTION Left 1960s - 1974 X 4  . LAPAROSCOPIC ABLATION RENAL MASS  ~ 2014   "large abscess/pus ball"  . [place]   "they were wedged in the bowel"  . PATELLA FRACTURE SURGERY Left 1963  . PATELLA FRACTURE SURGERY Left ~ 1965   "removed knee cap"  . ROBOT ASSISTED LAPAROSCOPIC NEPHRECTOMY Left 10/07/2017   Procedure: XI ROBOTIC ASSISTED LAPAROSCOPIC NEPHRECTOMY OF PELVIC KIDNEY;  Surgeon: Alexis Frock, MD;  Location: [facility];  Service: Urology;  Laterality: Left;  Place robot patient tower at foot of bed like prostate per Dr. Tresa Moore. Pull extra staple loads.  . TONSILLECTOMY      There were no vitals filed for this visit.  Subjective Assessment - 11/13/18 1406    Subjective  Pt states mild decrease in pain today  since last visit. He has been doing HEP.     Currently in Pain?  Yes    Pain Score  5     Pain Location  Shoulder    Pain Orientation  Left    Pain Descriptors / Indicators  Aching;Sore    Pain Type  Acute pain    Pain Onset  More than a month ago    Pain Frequency  Intermittent                       [facility] Adult PT Treatment/Exercise - 11/13/18 1140      Shoulder Exercises: Supine   External Rotation  AAROM;20 reps    External Rotation Limitations  cane    Flexion  20 reps;AAROM    Flexion Limitations  cane  1.5 lb      Shoulder Exercises: Seated   External Rotation  20 reps    External Rotation Limitations  cane      Shoulder Exercises: Standing   Row  20 reps    Theraband Level (Shoulder Row)  Level 3  (Green)    Other Standing Exercises  Wall slides flexion and circles x15 each ;       Shoulder Exercises: Pulleys   Flexion  2 minutes      Shoulder Exercises: Stretch   Internal Rotation Stretch  --    Internal Rotation Stretch Limitations  behind the back stretch 5 sec x10;       Manual Therapy   Joint Mobilization  L GHJ all motions    Passive ROM  L shoulder, all motions               PT Short Term Goals - 11/10/18 1210      PT SHORT TERM GOAL #1   Title  Pt to be independent with initial HEP     Time  2    Period  Weeks    Status  New    Target Date  11/24/18      PT SHORT TERM GOAL #2   Title  Pt to demo improved L shoulder PROM by at least 10 degrees for flexion and ER.    Time  3    Period  Weeks    Status  New    Target Date  12/01/18        PT Long Term Goals - 11/10/18 1210      PT LONG TERM GOAL #1   Title  Pt to be independent with final HEP     Time  6    Period  Weeks    Status  New    Target Date  12/22/18      PT LONG TERM GOAL #2   Title  Pt to report decreased pain in L shoulder, to 0-2/10 with activity     Time  6    Period  Weeks    Status  New    Target Date  12/22/18      PT LONG TERM GOAL #3   Title  Pt to demo improved L shoulder AROM to [facility], to improve ability for ADLs and IADLs.     Time  6    Period  Weeks    Status  New    Target Date  12/22/18      PT LONG TERM GOAL #4   Title  Pt to demo improved strength of L shoulder to be at least 4+/5 to improve  ability for lifting, reaching, carrying, and IADls     Time  6    Period  Weeks    Status  New    Target Date  12/22/18            Plan - 11/13/18 1408    Clinical Impression Statement  Pt with mild improvements of flexion motion and pain in flexion motion today. Pt with soreness and stiffness with ER, mild improvement in IR behind back today. Pt to benefit from progression of ther ex and  manual with focus on gaining ROM.     Rehab Potential  Good     Clinical Impairments Affecting Rehab Potential  Pt to be seen at 3x/wk due to need for high frequency ROM following manipulation.     PT Frequency  3x / week    PT Duration  6 weeks    PT Treatment/Interventions  ADLs/Self Care Home Management;Cryotherapy;Electrical Stimulation;Iontophoresis 4mg /ml Dexamethasone;Functional mobility training;Ultrasound;Moist Heat;Therapeutic activities;Therapeutic exercise;Neuromuscular re-education;Patient/family education;Passive range of motion;Manual techniques;Dry needling    Consulted and Agree with Plan of Care  Patient       Patient will benefit from skilled therapeutic intervention in order to improve the following deficits and impairments:  Decreased range of motion, Impaired UE functional use, Increased muscle spasms, Decreased activity tolerance, Pain, Improper body mechanics, Impaired flexibility, Hypomobility, Decreased mobility, Decreased strength  Visit Diagnosis: Chronic left shoulder pain  Stiffness of left shoulder, not elsewhere classified     Problem List Patient Active Problem List   Diagnosis Date Noted  . MGUS (monoclonal gammopathy of unknown significance) 08/02/2018  . Osteoporosis 06/26/2018  . Perennial allergic rhinitis 01/24/2018  . 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 Urinary tract infection 09/03/2017  . Constipation 09/03/2017  . Anxiety 07/24/2017  . Atherosclerosis of native arteries of extremity with intermittent claudication ([place]) 07/06/2017  . Pyelonephritis 04/23/2017  . Weight loss 04/23/2017  . History of adenomatous polyp of colon 04/22/2017  . Aortic atherosclerosis ([place]) 04/13/2017  . Hyperlipidemia 04/04/2017  . Hypothyroidism (acquired) 04/04/2017  . Seasonal allergies 04/04/2017  . Myalgia 10/04/2016  . Former smoker 09/01/2016  . COPD, severe ([place]) 08/23/2016  . Dysuria 08/23/2016  . Chronic pain of both knees 08/23/2016  . Hx of CABG 08/05/2016  . CAD  (coronary artery disease) of artery bypass graft 08/05/2016  . Essential hypertension 08/05/2016  . Essential tremor 06/11/2016  . Tension headache 06/11/2016   Lyndee Hensen, PT, DPT 2:10 PM  11/13/18    [facility] [place], [place], [address] Phone: [phone]   Fax:  [phone]  Name: Bard Haupert MRN: [id number] Date of Birth: 1950-11-29

## 2018-11-14 ENCOUNTER — Encounter: Payer: Self-pay | Admitting: Physical Therapy

## 2018-11-14 ENCOUNTER — Ambulatory Visit (INDEPENDENT_AMBULATORY_CARE_PROVIDER_SITE_OTHER): Payer: Medicare Other | Admitting: Physical Therapy

## 2018-11-14 DIAGNOSIS — M25612 Stiffness of left shoulder, not elsewhere classified: Secondary | ICD-10-CM

## 2018-11-14 DIAGNOSIS — G8929 Other chronic pain: Secondary | ICD-10-CM

## 2018-11-14 DIAGNOSIS — M25512 Pain in left shoulder: Secondary | ICD-10-CM | POA: Diagnosis not present

## 2018-11-14 NOTE — Therapy (Signed)
[facility] [address] [place], [place], [address] Phone: [phone]   Fax:  [phone]  Physical Therapy Treatment  Patient Details  Name: Brian Graham MRN: [id number] Date of Birth: 10/26/1951 Referring Provider (PT): Teresa Coombs   Encounter Date: 11/14/2018  PT End of Session - 11/14/18 1025    Visit Number  3    Number of Visits  12    Date for PT Re-Evaluation  12/22/18    Authorization Type  [employer] medicare    PT Start Time  1010    PT Stop Time  1049    PT Time Calculation (min)  39 min    Activity Tolerance  Patient tolerated treatment well;Patient limited by pain    Behavior During Therapy  [facility] for tasks assessed/performed       Past Medical History:  Diagnosis Date  . Anxiety   . Arthritis    "left knee" (08/05/2016)  . Bruises easily   . CAD (coronary artery disease) of artery bypass graft 08/05/2016   Around age 67. CABG - 3 vessel.   . Chronic kidney disease   . Colon polyp   . COPD, severe ([place]) 08/23/2016   Alpha 1 studies normal per prior pulmonary group notes Smoked 40 years, quit in 2012 June 2016 PFT from prior pulmonary group: "significant obstruction" FEV1 1.58L (47% pred), Residual volume 171% pred, DLCO 59% pred Simple Spirometry>> 09/15/2016 ratio 42% FEV1 1.02 L / 31%   . Essential tremor 06/11/2016   Plans to see Dr. Carles Collet  . Former smoker 09/01/2016   Quit 2012. 40 pack years at least.   . GERD (gastroesophageal reflux disease)   . Headache   . History of blood transfusion 08/1997   "when he had his heart surgery"  . History of shingles 1970-2013 X 3  . HTN (hypertension) 08/05/2016   Amlodipine 5mg , atenolol 50mg  BID, benazepril 20mg   . Hyperlipemia   . Hypothyroidism   . Lumbar disc disease   . Myocardial infarction ([place]) 08/1997  . Peripheral vascular disease ([place])   . PONV (postoperative nausea and vomiting)   . Urinary tract infection 09/03/2017  . Wears glasses     Past  Surgical History:  Procedure Laterality Date  . AORTOGRAM Right 08/31/2017   Procedure: AORTOGRAM BILATERAL PELVIC ANGIOGRAM WITH LEFT ILIAC ARTERY STENT;  Surgeon: Conrad [place], MD;  Location: [facility];  Service: Vascular;  Laterality: Right;  . BACK SURGERY    . CARDIAC CATHETERIZATION  08/1997  . CORONARY ARTERY BYPASS GRAFT  09/12/1997   "triple"  . INGUINAL HERNIA REPAIR Right 1961  . KIDNEY REMOVED    . KNEE RECONSTRUCTION Left 1960s - 1974 X 4  . LAPAROSCOPIC ABLATION RENAL MASS  ~ 2014   "large abscess/pus ball"  . [place]   "they were wedged in the bowel"  . PATELLA FRACTURE SURGERY Left 1963  . PATELLA FRACTURE SURGERY Left ~ 1965   "removed knee cap"  . ROBOT ASSISTED LAPAROSCOPIC NEPHRECTOMY Left 10/07/2017   Procedure: XI ROBOTIC ASSISTED LAPAROSCOPIC NEPHRECTOMY OF PELVIC KIDNEY;  Surgeon: Alexis Frock, MD;  Location[facility];  Service: Urology;  Laterality: Left;  Place robot patient tower at foot of bed like prostate per Dr. Tresa Moore. Pull extra staple loads.  . TONSILLECTOMY      There were no vitals filed for this visit.  Subjective Assessment - 11/14/18 1024    Subjective  Pt states improving pain and movement. He  is sore today though    Currently in Pain?  Yes    Pain Score  3     Pain Location  Shoulder    Pain Orientation  Left    Pain Descriptors / Indicators  Aching;Sore    Pain Type  Acute pain    Pain Onset  More than a month ago    Pain Frequency  Intermittent                       [facility] Adult PT Treatment/Exercise - 11/14/18 1025      Shoulder Exercises: Supine   External Rotation  AAROM;20 reps    External Rotation Limitations  x20 with cane/  x20 AROM    Flexion  20 reps;AAROM    Flexion Limitations  cane  1.5 lb      Shoulder Exercises: Standing   Row  20 reps    Theraband Level (Shoulder Row)  Level 3 (Green)    Other Standing Exercises  Wall slides flexion x20    Other Standing Exercises  pendulum x10       Shoulder Exercises: Pulleys   Flexion  2 minutes      Shoulder Exercises: Stretch   Internal Rotation Stretch Limitations  --    Other Shoulder Stretches  ER at wall 5 sec x15;       Manual Therapy   Joint Mobilization  L GHJ all motions    Passive ROM  L shoulder, all motions               PT Short Term Goals - 11/10/18 1210      PT SHORT TERM GOAL #1   Title  Pt to be independent with initial HEP     Time  2    Period  Weeks    Status  New    Target Date  11/24/18      PT SHORT TERM GOAL #2   Title  Pt to demo improved L shoulder PROM by at least 10 degrees for flexion and ER.    Time  3    Period  Weeks    Status  New    Target Date  12/01/18        PT Long Term Goals - 11/10/18 1210      PT LONG TERM GOAL #1   Title  Pt to be independent with final HEP     Time  6    Period  Weeks    Status  New    Target Date  12/22/18      PT LONG TERM GOAL #2   Title  Pt to report decreased pain in L shoulder, to 0-2/10 with activity     Time  6    Period  Weeks    Status  New    Target Date  12/22/18      PT LONG TERM GOAL #3   Title  Pt to demo improved L shoulder AROM to [name], to improve ability for ADLs and IADLs.     Time  6    Period  Weeks    Status  New    Target Date  12/22/18      PT LONG TERM GOAL #4   Title  Pt to demo improved strength of L shoulder to be at least 4+/5 to improve ability for lifting, reaching, carrying, and IADls     Time  6    Period  Weeks  Status  New    Target Date  12/22/18            Plan - 11/14/18 1103    Clinical Impression Statement  Pt with improving motions for elevation, for PROM and AROM. He continues to have soreness and stiffness for ER and IR. Pt requires cueing with all ther ex, to relax arm and not push too hard. Pt with minimal pain throughout session, but states "pop" at end of session, when he bent over to do pendulum exercise, that caused him a lot of increased pain in anterior shoulder. Pt  unable to perform further ther ex after that, due to pain. Pt declines ice at end of session, states he will do at home.     Rehab Potential  Good    Clinical Impairments Affecting Rehab Potential  Pt to be seen at 3x/wk due to need for high frequency ROM following manipulation.     PT Frequency  3x / week    PT Duration  6 weeks    PT Treatment/Interventions  ADLs/Self Care Home Management;Cryotherapy;Electrical Stimulation;Iontophoresis 4mg /ml Dexamethasone;Functional mobility training;Ultrasound;Moist Heat;Therapeutic activities;Therapeutic exercise;Neuromuscular re-education;Patient/family education;Passive range of motion;Manual techniques;Dry needling    Consulted and Agree with Plan of Care  Patient       Patient will benefit from skilled therapeutic intervention in order to improve the following deficits and impairments:  Decreased range of motion, Impaired UE functional use, Increased muscle spasms, Decreased activity tolerance, Pain, Improper body mechanics, Impaired flexibility, Hypomobility, Decreased mobility, Decreased strength  Visit Diagnosis: Chronic left shoulder pain  Stiffness of left shoulder, not elsewhere classified     Problem List Patient Active Problem List   Diagnosis Date Noted  . MGUS (monoclonal gammopathy of unknown significance) 08/02/2018  . Osteoporosis 06/26/2018  . Perennial allergic rhinitis 01/24/2018  . 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 Urinary tract infection 09/03/2017  . Constipation 09/03/2017  . Anxiety 07/24/2017  . Atherosclerosis of native arteries of extremity with intermittent claudication ([place]) 07/06/2017  . Pyelonephritis 04/23/2017  . Weight loss 04/23/2017  . History of adenomatous polyp of colon 04/22/2017  . Aortic atherosclerosis ([place]) 04/13/2017  . Hyperlipidemia 04/04/2017  . Hypothyroidism (acquired) 04/04/2017  . Seasonal allergies 04/04/2017  . Myalgia 10/04/2016  . Former  smoker 09/01/2016  . COPD, severe ([place]) 08/23/2016  . Dysuria 08/23/2016  . Chronic pain of both knees 08/23/2016  . Hx of CABG 08/05/2016  . CAD (coronary artery disease) of artery bypass graft 08/05/2016  . Essential hypertension 08/05/2016  . Essential tremor 06/11/2016  . Tension headache 06/11/2016    Lyndee Hensen, PT, DPT 11:07 AM  11/14/18    [facility] [place], [place], [address] Phone: [phone]   Fax:  [phone]  Name: Iker Nuttall MRN: [id number] Date of Birth: 1951-07-10

## 2018-11-16 ENCOUNTER — Other Ambulatory Visit: Payer: Self-pay | Admitting: Family Medicine

## 2018-11-17 ENCOUNTER — Encounter: Payer: Self-pay | Admitting: Physical Therapy

## 2018-11-17 ENCOUNTER — Ambulatory Visit (INDEPENDENT_AMBULATORY_CARE_PROVIDER_SITE_OTHER): Payer: Medicare Other | Admitting: Physical Therapy

## 2018-11-17 DIAGNOSIS — M25512 Pain in left shoulder: Secondary | ICD-10-CM

## 2018-11-17 DIAGNOSIS — M25612 Stiffness of left shoulder, not elsewhere classified: Secondary | ICD-10-CM

## 2018-11-17 DIAGNOSIS — G8929 Other chronic pain: Secondary | ICD-10-CM

## 2018-11-20 ENCOUNTER — Encounter: Payer: Self-pay | Admitting: Physical Therapy

## 2018-11-20 ENCOUNTER — Encounter (INDEPENDENT_AMBULATORY_CARE_PROVIDER_SITE_OTHER): Payer: Self-pay | Admitting: Orthopaedic Surgery

## 2018-11-20 ENCOUNTER — Ambulatory Visit (INDEPENDENT_AMBULATORY_CARE_PROVIDER_SITE_OTHER): Payer: Medicare Other | Admitting: Orthopaedic Surgery

## 2018-11-20 ENCOUNTER — Encounter: Payer: Medicare Other | Admitting: Physical Therapy

## 2018-11-20 VITALS — BP 128/77 | HR 60 | Ht 69.0 in | Wt 156.0 lb

## 2018-11-20 DIAGNOSIS — G8929 Other chronic pain: Secondary | ICD-10-CM

## 2018-11-20 DIAGNOSIS — M25512 Pain in left shoulder: Secondary | ICD-10-CM

## 2018-11-20 NOTE — Progress Notes (Signed)
Office Visit Note   Patient: Brian Graham           Date of Birth: Aug 21, 1951           MRN: [id number] Visit Date: 11/20/2018              Requested by: [name], MD [place], [place] [address] PCP: [name], MD   Assessment & Plan: Visit Diagnoses:  1. Chronic left shoulder pain     Plan: 11 days status post shoulder closed manipulation doing very well.  Has excellent range of motion and no pain.  Will urged him to continue with his exercises and strengthening and return to see me as needed  Follow-Up Instructions: Return if symptoms worsen or fail to improve.   Orders:  No orders of the defined types were placed in this encounter.  No orders of the defined types were placed in this encounter.     Procedures: No procedures performed   Clinical Data: No additional findings.   Subjective: No chief complaint on file. 11 days status post left shoulder manipulation under anesthesia and doing very well.  No longer has the pain and excellent range of motion.  Is finishing his physical therapy  HPI  Review of Systems   Objective: Vital Signs: BP 128/77 (BP Location: Left Arm, Patient Position: Sitting, Cuff Size: Normal)   Pulse 60   Ht 5\' 9"  (1.753 m)   Wt 156 lb (70.8 kg)   BMI 23.04 kg/m   Physical Exam  Ortho Exam left shoulder with full overhead range of motion.  No impingement.  Skin intact.  Able to place his arm behind his back without any discomfort.  Specialty Comments:  No specialty comments available.  Imaging: No results found.   PMFS History: Patient Active Problem List   Diagnosis Date Noted  . MGUS (monoclonal gammopathy of unknown significance) 08/02/2018  . Osteoporosis 06/26/2018  . Perennial allergic rhinitis 01/24/2018  . 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 Urinary tract infection 09/03/2017  . Constipation 09/03/2017  . Anxiety 07/24/2017  .  Atherosclerosis of native arteries of extremity with intermittent claudication ([place]) 07/06/2017  . Pyelonephritis 04/23/2017  . Weight loss 04/23/2017  . History of adenomatous polyp of colon 04/22/2017  . Aortic atherosclerosis ([place]) 04/13/2017  . Hyperlipidemia 04/04/2017  . Hypothyroidism (acquired) 04/04/2017  . Seasonal allergies 04/04/2017  . Myalgia 10/04/2016  . Former smoker 09/01/2016  . COPD, severe ([place]) 08/23/2016  . Dysuria 08/23/2016  . Chronic pain of both knees 08/23/2016  . Hx of CABG 08/05/2016  . CAD (coronary artery disease) of artery bypass graft 08/05/2016  . Essential hypertension 08/05/2016  . Essential tremor 06/11/2016  . Tension headache 06/11/2016   Past Medical History:  Diagnosis Date  . Anxiety   . Arthritis    "left knee" (08/05/2016)  . Bruises easily   . CAD (coronary artery disease) of artery bypass graft 08/05/2016   Around age 67. CABG - 3 vessel.   . Chronic kidney disease   . Colon polyp   . COPD, severe ([place]) 08/23/2016   Alpha 1 studies normal per prior pulmonary group notes Smoked 40 years, quit in 2012 June 2016 PFT from prior pulmonary group: "significant obstruction" FEV1 1.58L (47% pred), Residual volume 171% pred, DLCO 59% pred Simple Spirometry>> 09/15/2016 ratio 42% FEV1 1.02 L / 31%   . Essential tremor 06/11/2016   Plans to see Dr.  Tat  . Former smoker 09/01/2016   Quit 2012. 40 pack years at least.   . GERD (gastroesophageal reflux disease)   . Headache   . History of blood transfusion 08/1997   "when he had his heart surgery"  . History of shingles 1970-2013 X 3  . HTN (hypertension) 08/05/2016   Amlodipine 5mg , atenolol 50mg  BID, benazepril 20mg   . Hyperlipemia   . Hypothyroidism   . Lumbar disc disease   . Myocardial infarction ([place]) 08/1997  . Peripheral vascular disease ([place])   . PONV (postoperative nausea and vomiting)   . Urinary tract infection 09/03/2017  . Wears glasses     Family History  Problem Relation  Age of Onset  . Alcohol abuse Mother   . Alcohol abuse Father        not involved in life  . Alcoholism Sister   . Healthy Sister   . Healthy Sister   . Allergic rhinitis Neg Hx   . Angioedema Neg Hx   . Asthma Neg Hx   . Eczema Neg Hx   . Immunodeficiency Neg Hx   . Urticaria Neg Hx     Past Surgical History:  Procedure Laterality Date  . AORTOGRAM Right 08/31/2017   Procedure: AORTOGRAM BILATERAL PELVIC ANGIOGRAM WITH LEFT ILIAC ARTERY STENT;  Surgeon: Conrad [place], MD;  Location: [facility];  Service: Vascular;  Laterality: Right;  . BACK SURGERY    . CARDIAC CATHETERIZATION  08/1997  . CORONARY ARTERY BYPASS GRAFT  09/12/1997   "triple"  . INGUINAL HERNIA REPAIR Right 1961  . KIDNEY REMOVED    . KNEE RECONSTRUCTION Left 1960s - 1974 X 4  . LAPAROSCOPIC ABLATION RENAL MASS  ~ 2014   "large abscess/pus ball"  . [place]   "they were wedged in the bowel"  . PATELLA FRACTURE SURGERY Left 1963  . PATELLA FRACTURE SURGERY Left ~ 1965   "removed knee cap"  . ROBOT ASSISTED LAPAROSCOPIC NEPHRECTOMY Left 10/07/2017   Procedure: XI ROBOTIC ASSISTED LAPAROSCOPIC NEPHRECTOMY OF PELVIC KIDNEY;  Surgeon: Alexis Frock, MD;  Location: [facility];  Service: Urology;  Laterality: Left;  Place robot patient tower at foot of bed like prostate per Dr. Tresa Moore. Pull extra staple loads.  . TONSILLECTOMY     Social History   Occupational History  . Not on file  Tobacco Use  . Smoking status: Former Smoker    Packs/day: 1.00    Years: 45.00    Pack years: 45.00    Types: Cigarettes    Last attempt to quit: 07/10/2017    Years since quitting: 1.3  . Smokeless tobacco: Never Used  Substance and Sexual Activity  . Alcohol use: Yes    Alcohol/week: 14.0 standard drinks    Types: 14 Shots of liquor per week    Comment: daily 2 shots of vodka  . Drug use: No  . Sexual activity: Not Currently     Garald Balding, MD   Note - This record has been created using [employer].  Chart creation errors have been sought, but may not always  have been located. Such creation errors do not reflect on  the standard of medical care.  POST OP/L SHOULDER  (surgery date 11-09-18). Pt stated still having discomfort and  PT 3x a week.

## 2018-11-20 NOTE — Therapy (Signed)
[facility] [address] [place], [place], [address] Phone: [phone]   Fax:  [phone]  Physical Therapy Treatment  Patient Details  Name: Brian Graham MRN: [id number] Date of Birth: 16-Oct-1951 Referring Provider (PT): Teresa Coombs   Encounter Date: 11/17/2018  PT End of Session - 11/20/18 0835    Visit Number  4    Number of Visits  12    Date for PT Re-Evaluation  12/22/18    Authorization Type  [employer] medicare    PT Start Time  1520    PT Stop Time  1600    PT Time Calculation (min)  40 min    Activity Tolerance  Patient tolerated treatment well;Patient limited by pain    Behavior During Therapy  [facility] for tasks assessed/performed       Past Medical History:  Diagnosis Date  . Anxiety   . Arthritis    "left knee" (08/05/2016)  . Bruises easily   . CAD (coronary artery disease) of artery bypass graft 08/05/2016   Around age 62. CABG - 3 vessel.   . Chronic kidney disease   . Colon polyp   . COPD, severe ([place]) 08/23/2016   Alpha 1 studies normal per prior pulmonary group notes Smoked 40 years, quit in 2012 June 2016 PFT from prior pulmonary group: "significant obstruction" FEV1 1.58L (47% pred), Residual volume 171% pred, DLCO 59% pred Simple Spirometry>> 09/15/2016 ratio 42% FEV1 1.02 L / 31%   . Essential tremor 06/11/2016   Plans to see Dr. Carles Collet  . Former smoker 09/01/2016   Quit 2012. 40 pack years at least.   . GERD (gastroesophageal reflux disease)   . Headache   . History of blood transfusion 08/1997   "when he had his heart surgery"  . History of shingles 1970-2013 X 3  . HTN (hypertension) 08/05/2016   Amlodipine 5mg , atenolol 50mg  BID, benazepril 20mg   . Hyperlipemia   . Hypothyroidism   . Lumbar disc disease   . Myocardial infarction ([place]) 08/1997  . Peripheral vascular disease ([place])   . PONV (postoperative nausea and vomiting)   . Urinary tract infection 09/03/2017  . Wears glasses     Past  Surgical History:  Procedure Laterality Date  . AORTOGRAM Right 08/31/2017   Procedure: AORTOGRAM BILATERAL PELVIC ANGIOGRAM WITH LEFT ILIAC ARTERY STENT;  Surgeon: Conrad [place], MD;  Location: [facility];  Service: Vascular;  Laterality: Right;  . BACK SURGERY    . CARDIAC CATHETERIZATION  08/1997  . CORONARY ARTERY BYPASS GRAFT  09/12/1997   "triple"  . INGUINAL HERNIA REPAIR Right 1961  . KIDNEY REMOVED    . KNEE RECONSTRUCTION Left 1960s - 1974 X 4  . LAPAROSCOPIC ABLATION RENAL MASS  ~ 2014   "large abscess/pus ball"  . [place]   "they were wedged in the bowel"  . PATELLA FRACTURE SURGERY Left 1963  . PATELLA FRACTURE SURGERY Left ~ 1965   "removed knee cap"  . ROBOT ASSISTED LAPAROSCOPIC NEPHRECTOMY Left 10/07/2017   Procedure: XI ROBOTIC ASSISTED LAPAROSCOPIC NEPHRECTOMY OF PELVIC KIDNEY;  Surgeon: Alexis Frock, MD;  [facility];  Service: Urology;  Laterality: Left;  Place robot patient tower at foot of bed like prostate per Dr. Tresa Moore. Pull extra staple loads.  . TONSILLECTOMY      There were no vitals filed for this visit.  Subjective Assessment - 11/20/18 0834    Subjective  Pt states improving pain and movement. Is  able to dress easier. Very minimal pain today     Currently in Pain?  No/denies    Pain Score  0-No pain                       [facility] Adult PT Treatment/Exercise - 11/20/18 0001      Shoulder Exercises: Supine   Horizontal ABduction  20 reps;AROM    External Rotation  20 reps;AROM    External Rotation Weight (lbs)  2    Flexion  20 reps;AAROM    Flexion Limitations  cane  x20/  AROM x10 2lb supine      Shoulder Exercises: Standing   Row  20 reps    Theraband Level (Shoulder Row)  Level 3 (Green)    Other Standing Exercises  AROM: scap and abd x15 each bil;       Shoulder Exercises: Pulleys   Flexion  2 minutes    Scaption  1 minute      Shoulder Exercises: Stretch   Corner Stretch  3 reps;30 seconds     Internal Rotation Stretch  5 reps    Internal Rotation Stretch Limitations  behind the back stretch 5 sec x10;       Manual Therapy   Joint Mobilization  L GHJ all motions    Passive ROM  L shoulder, all motions               PT Short Term Goals - 11/10/18 1210      PT SHORT TERM GOAL #1   Title  Pt to be independent with initial HEP     Time  2    Period  Weeks    Status  New    Target Date  11/24/18      PT SHORT TERM GOAL #2   Title  Pt to demo improved L shoulder PROM by at least 10 degrees for flexion and ER.    Time  3    Period  Weeks    Status  New    Target Date  12/01/18        PT Long Term Goals - 11/10/18 1210      PT LONG TERM GOAL #1   Title  Pt to be independent with final HEP     Time  6    Period  Weeks    Status  New    Target Date  12/22/18      PT LONG TERM GOAL #2   Title  Pt to report decreased pain in L shoulder, to 0-2/10 with activity     Time  6    Period  Weeks    Status  New    Target Date  12/22/18      PT LONG TERM GOAL #3   Title  Pt to demo improved L shoulder AROM to [facility], to improve ability for ADLs and IADLs.     Time  6    Period  Weeks    Status  New    Target Date  12/22/18      PT LONG TERM GOAL #4   Title  Pt to demo improved strength of L shoulder to be at least 4+/5 to improve ability for lifting, reaching, carrying, and IADls     Time  6    Period  Weeks    Status  New    Target Date  12/22/18  Plan - 11/20/18 0836    Clinical Impression Statement  Pt progressing with pain and ROM. Improving ability for AROM and PROM, but PROM for ER still quite painful. Plan to progress strength and AROM as tolerated.     Rehab Potential  Good    Clinical Impairments Affecting Rehab Potential  Pt to be seen at 3x/wk due to need for high frequency ROM following manipulation.     PT Frequency  3x / week    PT Duration  6 weeks    PT Treatment/Interventions  ADLs/Self Care Home  Management;Cryotherapy;Electrical Stimulation;Iontophoresis 4mg /ml Dexamethasone;Functional mobility training;Ultrasound;Moist Heat;Therapeutic activities;Therapeutic exercise;Neuromuscular re-education;Patient/family education;Passive range of motion;Manual techniques;Dry needling    Consulted and Agree with Plan of Care  Patient       Patient will benefit from skilled therapeutic intervention in order to improve the following deficits and impairments:  Decreased range of motion, Impaired UE functional use, Increased muscle spasms, Decreased activity tolerance, Pain, Improper body mechanics, Impaired flexibility, Hypomobility, Decreased mobility, Decreased strength  Visit Diagnosis: Chronic left shoulder pain  Stiffness of left shoulder, not elsewhere classified     Problem List Patient Active Problem List   Diagnosis Date Noted  . MGUS (monoclonal gammopathy of unknown significance) 08/02/2018  . Osteoporosis 06/26/2018  . Perennial allergic rhinitis 01/24/2018  . 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 Urinary tract infection 09/03/2017  . Constipation 09/03/2017  . Anxiety 07/24/2017  . Atherosclerosis of native arteries of extremity with intermittent claudication ([place]) 07/06/2017  . Pyelonephritis 04/23/2017  . Weight loss 04/23/2017  . History of adenomatous polyp of colon 04/22/2017  . Aortic atherosclerosis ([place]) 04/13/2017  . Hyperlipidemia 04/04/2017  . Hypothyroidism (acquired) 04/04/2017  . Seasonal allergies 04/04/2017  . Myalgia 10/04/2016  . Former smoker 09/01/2016  . COPD, severe ([place]) 08/23/2016  . Dysuria 08/23/2016  . Chronic pain of both knees 08/23/2016  . Hx of CABG 08/05/2016  . CAD (coronary artery disease) of artery bypass graft 08/05/2016  . Essential hypertension 08/05/2016  . Essential tremor 06/11/2016  . Tension headache 06/11/2016    Lyndee Hensen, PT, DPT 8:38 AM  11/20/18    [facility] [place], [place], [address] Phone: [phone]   Fax:  [phone]  Name: Brian Graham MRN: [id number] Date of Birth: 19-Aug-1951

## 2018-11-21 ENCOUNTER — Ambulatory Visit (INDEPENDENT_AMBULATORY_CARE_PROVIDER_SITE_OTHER): Payer: Medicare Other | Admitting: Physical Therapy

## 2018-11-21 ENCOUNTER — Encounter: Payer: Self-pay | Admitting: Physical Therapy

## 2018-11-21 DIAGNOSIS — G8929 Other chronic pain: Secondary | ICD-10-CM

## 2018-11-21 DIAGNOSIS — M25612 Stiffness of left shoulder, not elsewhere classified: Secondary | ICD-10-CM | POA: Diagnosis not present

## 2018-11-21 DIAGNOSIS — M25512 Pain in left shoulder: Secondary | ICD-10-CM

## 2018-11-21 NOTE — Therapy (Signed)
[facility] [address] [place], [place], [address] Phone: [phone]   Fax:  [phone]  Physical Therapy Treatment  Patient Details  Name: Brian Graham MRN: [id number] Date of Birth: 1951/09/22 Referring Provider (PT): Teresa Coombs   Encounter Date: 11/21/2018  PT End of Session - 11/21/18 1239    Visit Number  5    Number of Visits  12    Date for PT Re-Evaluation  12/22/18    Authorization Type  [employer] medicare    PT Start Time  3007    PT Stop Time  1056    PT Time Calculation (min)  38 min    Activity Tolerance  Patient tolerated treatment well;Patient limited by pain    Behavior During Therapy  [facility] for tasks assessed/performed       Past Medical History:  Diagnosis Date  . Anxiety   . Arthritis    "left knee" (08/05/2016)  . Bruises easily   . CAD (coronary artery disease) of artery bypass graft 08/05/2016   Around age 28. CABG - 3 vessel.   . Chronic kidney disease   . Colon polyp   . COPD, severe ([place]) 08/23/2016   Alpha 1 studies normal per prior pulmonary group notes Smoked 40 years, quit in 2012 June 2016 PFT from prior pulmonary group: "significant obstruction" FEV1 1.58L (47% pred), Residual volume 171% pred, DLCO 59% pred Simple Spirometry>> 09/15/2016 ratio 42% FEV1 1.02 L / 31%   . Essential tremor 06/11/2016   Plans to see Dr. Carles Collet  . Former smoker 09/01/2016   Quit 2012. 40 pack years at least.   . GERD (gastroesophageal reflux disease)   . Headache   . History of blood transfusion 08/1997   "when he had his heart surgery"  . History of shingles 1970-2013 X 3  . HTN (hypertension) 08/05/2016   Amlodipine 5mg , atenolol 50mg  BID, benazepril 20mg   . Hyperlipemia   . Hypothyroidism   . Lumbar disc disease   . Myocardial infarction ([place]) 08/1997  . Peripheral vascular disease ([place])   . PONV (postoperative nausea and vomiting)   . Urinary tract infection 09/03/2017  . Wears glasses     Past  Surgical History:  Procedure Laterality Date  . AORTOGRAM Right 08/31/2017   Procedure: AORTOGRAM BILATERAL PELVIC ANGIOGRAM WITH LEFT ILIAC ARTERY STENT;  Surgeon: Conrad Mackville, MD;  Location: [facility];  Service: Vascular;  Laterality: Right;  . BACK SURGERY    . CARDIAC CATHETERIZATION  08/1997  . CORONARY ARTERY BYPASS GRAFT  09/12/1997   "triple"  . INGUINAL HERNIA REPAIR Right 1961  . KIDNEY REMOVED    . KNEE RECONSTRUCTION Left 1960s - 1974 X 4  . LAPAROSCOPIC ABLATION RENAL MASS  ~ 2014   "large abscess/pus ball"  . [place]   "they were wedged in the bowel"  . PATELLA FRACTURE SURGERY Left 1963  . PATELLA FRACTURE SURGERY Left ~ 1965   "removed knee cap"  . ROBOT ASSISTED LAPAROSCOPIC NEPHRECTOMY Left 10/07/2017   Procedure: XI ROBOTIC ASSISTED LAPAROSCOPIC NEPHRECTOMY OF PELVIC KIDNEY;  Surgeon: Alexis Frock, MD;  Location: [facility];  Service: Urology;  Laterality: Left;  Place robot patient tower at foot of bed like prostate per Dr. Tresa Moore. Pull extra staple loads.  . TONSILLECTOMY      There were no vitals filed for this visit.  Subjective Assessment - 11/21/18 1239    Subjective  Pt states improving pain, and ROM.  Currently in Pain?  Yes    Pain Score  1     Pain Location  Shoulder    Pain Orientation  Left    Pain Descriptors / Indicators  Aching    Pain Type  Acute pain    Pain Onset  More than a month ago    Pain Frequency  Intermittent         [facility] PT Assessment - 11/21/18 1028      AROM   Left Shoulder Flexion  130 Degrees    Left Shoulder ABduction  130 Degrees    Left Shoulder Internal Rotation  70 Degrees    Left Shoulder External Rotation  45 Degrees      PROM   Left Shoulder Flexion  155 Degrees    Left Shoulder ABduction  150 Degrees    Left Shoulder Internal Rotation  70 Degrees    Left Shoulder External Rotation  45 Degrees                   [facility] Adult PT Treatment/Exercise - 11/21/18 1024      Exercises    Exercises  Shoulder      Shoulder Exercises: Supine   Horizontal ABduction  20 reps;AROM    Horizontal ABduction Weight (lbs)  1lb    External Rotation  20 reps;AROM    External Rotation Weight (lbs)  --    External Rotation Limitations  cane    Flexion  20 reps;AROM    Shoulder Flexion Weight (lbs)  1lb    Flexion Limitations  --      Shoulder Exercises: Sidelying   External Rotation  20 reps      Shoulder Exercises: Standing   Row  20 reps    Theraband Level (Shoulder Row)  Level 3 (Green)    Other Standing Exercises  AROM: scap and abd x15 each bil;       Shoulder Exercises: Pulleys   Flexion  2 minutes    Scaption  1 minute      Shoulder Exercises: Stretch   Corner Stretch  3 reps;30 seconds    Internal Rotation Stretch  5 reps    Internal Rotation Stretch Limitations  behind the back stretch 5 sec x10;       Manual Therapy   Joint Mobilization  L GHJ all motions    Passive ROM  L shoulder, all motions               PT Short Term Goals - 11/10/18 1210      PT SHORT TERM GOAL #1   Title  Pt to be independent with initial HEP     Time  2    Period  Weeks    Status  New    Target Date  11/24/18      PT SHORT TERM GOAL #2   Title  Pt to demo improved L shoulder PROM by at least 10 degrees for flexion and ER.    Time  3    Period  Weeks    Status  New    Target Date  12/01/18        PT Long Term Goals - 11/10/18 1210      PT LONG TERM GOAL #1   Title  Pt to be independent with final HEP     Time  6    Period  Weeks    Status  New    Target Date  12/22/18  PT LONG TERM GOAL #2   Title  Pt to report decreased pain in L shoulder, to 0-2/10 with activity     Time  6    Period  Weeks    Status  New    Target Date  12/22/18      PT LONG TERM GOAL #3   Title  Pt to demo improved L shoulder AROM to [facility], to improve ability for ADLs and IADLs.     Time  6    Period  Weeks    Status  New    Target Date  12/22/18      PT LONG TERM GOAL #4    Title  Pt to demo improved strength of L shoulder to be at least 4+/5 to improve ability for lifting, reaching, carrying, and IADls     Time  6    Period  Weeks    Status  New    Target Date  12/22/18            Plan - 11/21/18 1240    Clinical Impression Statement  Pt with improving pain, little/no pain with daily activities, but does have increased pain with ER ther ex and PROM by end of the session. Pt to benefit from continued care for improving ROM.     Rehab Potential  Good    Clinical Impairments Affecting Rehab Potential  Pt to be seen at 3x/wk due to need for high frequency ROM following manipulation.     PT Frequency  3x / week    PT Duration  6 weeks    PT Treatment/Interventions  ADLs/Self Care Home Management;Cryotherapy;Electrical Stimulation;Iontophoresis 4mg /ml Dexamethasone;Functional mobility training;Ultrasound;Moist Heat;Therapeutic activities;Therapeutic exercise;Neuromuscular re-education;Patient/family education;Passive range of motion;Manual techniques;Dry needling    Consulted and Agree with Plan of Care  Patient       Patient will benefit from skilled therapeutic intervention in order to improve the following deficits and impairments:  Decreased range of motion, Impaired UE functional use, Increased muscle spasms, Decreased activity tolerance, Pain, Improper body mechanics, Impaired flexibility, Hypomobility, Decreased mobility, Decreased strength  Visit Diagnosis: Chronic left shoulder pain  Stiffness of left shoulder, not elsewhere classified     Problem List Patient Active Problem List   Diagnosis Date Noted  . MGUS (monoclonal gammopathy of unknown significance) 08/02/2018  . Osteoporosis 06/26/2018  . Perennial allergic rhinitis 01/24/2018  . 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 Urinary tract infection 09/03/2017  . Constipation 09/03/2017  . Anxiety 07/24/2017  . Atherosclerosis of native arteries of  extremity with intermittent claudication ([place]) 07/06/2017  . Pyelonephritis 04/23/2017  . Weight loss 04/23/2017  . History of adenomatous polyp of colon 04/22/2017  . Aortic atherosclerosis ([place]) 04/13/2017  . Hyperlipidemia 04/04/2017  . Hypothyroidism (acquired) 04/04/2017  . Seasonal allergies 04/04/2017  . Myalgia 10/04/2016  . Former smoker 09/01/2016  . COPD, severe ([place]) 08/23/2016  . Dysuria 08/23/2016  . Chronic pain of both knees 08/23/2016  . Hx of CABG 08/05/2016  . CAD (coronary artery disease) of artery bypass graft 08/05/2016  . Essential hypertension 08/05/2016  . Essential tremor 06/11/2016  . Tension headache 06/11/2016   Lyndee Hensen, PT, DPT 12:42 PM  11/21/18    [facility] [place], [place], [address] Phone: [phone]   Fax:  [phone]  Name: Brian Graham MRN: [id number] Date of Birth: 07/11/1951

## 2018-11-23 ENCOUNTER — Ambulatory Visit (INDEPENDENT_AMBULATORY_CARE_PROVIDER_SITE_OTHER): Payer: Medicare HMO | Admitting: Physical Therapy

## 2018-11-23 ENCOUNTER — Encounter: Payer: Self-pay | Admitting: Physical Therapy

## 2018-11-23 DIAGNOSIS — M25612 Stiffness of left shoulder, not elsewhere classified: Secondary | ICD-10-CM | POA: Diagnosis not present

## 2018-11-23 DIAGNOSIS — M25512 Pain in left shoulder: Secondary | ICD-10-CM

## 2018-11-23 DIAGNOSIS — G8929 Other chronic pain: Secondary | ICD-10-CM

## 2018-11-23 NOTE — Therapy (Signed)
[facility] [address] [place], [place], [address] Phone: [phone]   Fax:  [phone]  Physical Therapy Treatment  Patient Details  Name: Brian Graham MRN: [id number] Date of Birth: 1951-02-11 Referring Provider (PT): Teresa Coombs   Encounter Date: 11/23/2018  PT End of Session - 11/23/18 1034    Visit Number  6    Number of Visits  12    Date for PT Re-Evaluation  12/22/18    Authorization Type  [employer] medicare    PT Start Time  1020    PT Stop Time  1058    PT Time Calculation (min)  38 min    Activity Tolerance  Patient tolerated treatment well;Patient limited by pain    Behavior During Therapy  [facility] for tasks assessed/performed       Past Medical History:  Diagnosis Date  . Anxiety   . Arthritis    "left knee" (08/05/2016)  . Bruises easily   . CAD (coronary artery disease) of artery bypass graft 08/05/2016   Around age 56. CABG - 3 vessel.   . Chronic kidney disease   . Colon polyp   . COPD, severe ([place]) 08/23/2016   Alpha 1 studies normal per prior pulmonary group notes Smoked 40 years, quit in 2012 June 2016 PFT from prior pulmonary group: "significant obstruction" FEV1 1.58L (47% pred), Residual volume 171% pred, DLCO 59% pred Simple Spirometry>> 09/15/2016 ratio 42% FEV1 1.02 L / 31%   . Essential tremor 06/11/2016   Plans to see Dr. Carles Collet  . Former smoker 09/01/2016   Quit 2012. 40 pack years at least.   . GERD (gastroesophageal reflux disease)   . Headache   . History of blood transfusion 08/1997   "when he had his heart surgery"  . History of shingles 1970-2013 X 3  . HTN (hypertension) 08/05/2016   Amlodipine 5mg , atenolol 50mg  BID, benazepril 20mg   . Hyperlipemia   . Hypothyroidism   . Lumbar disc disease   . Myocardial infarction ([place]) 08/1997  . Peripheral vascular disease ([place])   . PONV (postoperative nausea and vomiting)   . Urinary tract infection 09/03/2017  . Wears glasses     Past  Surgical History:  Procedure Laterality Date  . AORTOGRAM Right 08/31/2017   Procedure: AORTOGRAM BILATERAL PELVIC ANGIOGRAM WITH LEFT ILIAC ARTERY STENT;  Surgeon: Conrad Bogue, MD;  Location: [facility];  Service: Vascular;  Laterality: Right;  . BACK SURGERY    . CARDIAC CATHETERIZATION  08/1997  . CORONARY ARTERY BYPASS GRAFT  09/12/1997   "triple"  . INGUINAL HERNIA REPAIR Right 1961  . KIDNEY REMOVED    . KNEE RECONSTRUCTION Left 1960s - 1974 X 4  . LAPAROSCOPIC ABLATION RENAL MASS  ~ 2014   "large abscess/pus ball"  . [place]   "they were wedged in the bowel"  . PATELLA FRACTURE SURGERY Left 1963  . PATELLA FRACTURE SURGERY Left ~ 1965   "removed knee cap"  . ROBOT ASSISTED LAPAROSCOPIC NEPHRECTOMY Left 10/07/2017   Procedure: XI ROBOTIC ASSISTED LAPAROSCOPIC NEPHRECTOMY OF PELVIC KIDNEY;  Surgeon: Alexis Frock, MD;  [facility];  Service: Urology;  Laterality: Left;  Place robot patient tower at foot of bed like prostate per Dr. Tresa Moore. Pull extra staple loads.  . TONSILLECTOMY      There were no vitals filed for this visit.  Subjective Assessment - 11/23/18 1032    Subjective  Pt states minimal pain today, has been  trying to use L UE more.     Currently in Pain?  No/denies    Pain Score  0-No pain                       [facility] Adult PT Treatment/Exercise - 11/23/18 1034      Exercises   Exercises  Shoulder      Shoulder Exercises: Supine   Horizontal ABduction  --    Horizontal ABduction Weight (lbs)  --    External Rotation  20 reps;AROM    External Rotation Limitations  cane    Flexion  --    Shoulder Flexion Weight (lbs)  --    Other Supine Exercises  Pec stretch 30 sec x4;       Shoulder Exercises: Sidelying   External Rotation  20 reps      Shoulder Exercises: Standing   External Rotation  20 reps    Theraband Level (Shoulder External Rotation)  Level 2 (Red)    Internal Rotation  20 reps    Theraband Level  (Shoulder Internal Rotation)  Level 2 (Red)    Row  20 reps    Theraband Level (Shoulder Row)  Level 3 (Green)    Other Standing Exercises  AROM: scap and abd x20 each bil; x10 to 90 deg, x10 full motion    Other Standing Exercises  Wall slides x20, flexion;       Shoulder Exercises: Pulleys   Flexion  2 minutes    Scaption  1 minute      Shoulder Exercises: Stretch   Corner Stretch  3 reps;30 seconds    Internal Rotation Stretch  --    Internal Rotation Stretch Limitations  --      Manual Therapy   Manual Therapy  Soft tissue mobilization    Joint Mobilization  L GHJ all motions    Soft tissue mobilization  DTM to L pec with stretching     Passive ROM  L shoulder, all motions               PT Short Term Goals - 11/10/18 1210      PT SHORT TERM GOAL #1   Title  Pt to be independent with initial HEP     Time  2    Period  Weeks    Status  New    Target Date  11/24/18      PT SHORT TERM GOAL #2   Title  Pt to demo improved L shoulder PROM by at least 10 degrees for flexion and ER.    Time  3    Period  Weeks    Status  New    Target Date  12/01/18        PT Long Term Goals - 11/10/18 1210      PT LONG TERM GOAL #1   Title  Pt to be independent with final HEP     Time  6    Period  Weeks    Status  New    Target Date  12/22/18      PT LONG TERM GOAL #2   Title  Pt to report decreased pain in L shoulder, to 0-2/10 with activity     Time  6    Period  Weeks    Status  New    Target Date  12/22/18      PT LONG TERM GOAL #3   Title  Pt to demo  improved L shoulder AROM to [facility], to improve ability for ADLs and IADLs.     Time  6    Period  Weeks    Status  New    Target Date  12/22/18      PT LONG TERM GOAL #4   Title  Pt to demo improved strength of L shoulder to be at least 4+/5 to improve ability for lifting, reaching, carrying, and IADls     Time  6    Period  Weeks    Status  New    Target Date  12/22/18            Plan - 11/23/18 1102     Clinical Impression Statement  Pt with improving AROM and PROM, near full ROM for flexion. He has improved ability for ER AROM and strengthening, but continues to have motion limitations and pain at end range with PROM and AAROM. He is progressing well, plan to work towards d/c next week.     Rehab Potential  Good    Clinical Impairments Affecting Rehab Potential  Pt to be seen at 3x/wk due to need for high frequency ROM following manipulation.     PT Frequency  3x / week    PT Duration  6 weeks    PT Treatment/Interventions  ADLs/Self Care Home Management;Cryotherapy;Electrical Stimulation;Iontophoresis 4mg /ml Dexamethasone;Functional mobility training;Ultrasound;Moist Heat;Therapeutic activities;Therapeutic exercise;Neuromuscular re-education;Patient/family education;Passive range of motion;Manual techniques;Dry needling    Consulted and Agree with Plan of Care  Patient       Patient will benefit from skilled therapeutic intervention in order to improve the following deficits and impairments:  Decreased range of motion, Impaired UE functional use, Increased muscle spasms, Decreased activity tolerance, Pain, Improper body mechanics, Impaired flexibility, Hypomobility, Decreased mobility, Decreased strength  Visit Diagnosis: Chronic left shoulder pain  Stiffness of left shoulder, not elsewhere classified     Problem List Patient Active Problem List   Diagnosis Date Noted  . MGUS (monoclonal gammopathy of unknown significance) 08/02/2018  . Osteoporosis 06/26/2018  . Perennial allergic rhinitis 01/24/2018  . 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 Urinary tract infection 09/03/2017  . Constipation 09/03/2017  . Anxiety 07/24/2017  . Atherosclerosis of native arteries of extremity with intermittent claudication ([place]) 07/06/2017  . Pyelonephritis 04/23/2017  . Weight loss 04/23/2017  . History of adenomatous polyp of colon 04/22/2017  . Aortic  atherosclerosis ([place]) 04/13/2017  . Hyperlipidemia 04/04/2017  . Hypothyroidism (acquired) 04/04/2017  . Seasonal allergies 04/04/2017  . Myalgia 10/04/2016  . Former smoker 09/01/2016  . COPD, severe ([place]) 08/23/2016  . Dysuria 08/23/2016  . Chronic pain of both knees 08/23/2016  . Hx of CABG 08/05/2016  . CAD (coronary artery disease) of artery bypass graft 08/05/2016  . Essential hypertension 08/05/2016  . Essential tremor 06/11/2016  . Tension headache 06/11/2016   Lyndee Hensen, PT, DPT 11:04 AM  11/23/18    [facility] [place], [place], [address] Phone: [phone]   Fax:  [phone]  Name: Stelios Kirby MRN: [id number] Date of Birth: 1951-09-02

## 2018-11-27 ENCOUNTER — Encounter: Payer: Self-pay | Admitting: Physical Therapy

## 2018-11-27 ENCOUNTER — Ambulatory Visit (INDEPENDENT_AMBULATORY_CARE_PROVIDER_SITE_OTHER): Payer: Medicare HMO | Admitting: Physical Therapy

## 2018-11-27 DIAGNOSIS — G8929 Other chronic pain: Secondary | ICD-10-CM

## 2018-11-27 DIAGNOSIS — M25612 Stiffness of left shoulder, not elsewhere classified: Secondary | ICD-10-CM

## 2018-11-27 DIAGNOSIS — M25512 Pain in left shoulder: Secondary | ICD-10-CM

## 2018-11-27 NOTE — Therapy (Addendum)
[facility] [address] [place], [place], [address] Phone: [phone]   Fax:  [phone]  Physical Therapy Treatment  Patient Details  Name: Brian Graham MRN: [id number] Date of Birth: [birth year] Referring Provider (PT): Teresa Coombs   Encounter Date: 11/27/2018  PT End of Session - 11/27/18 1052    Visit Number  7    Number of Visits  12    Date for PT Re-Evaluation  12/22/18    Authorization Type  [employer] medicare    PT Start Time  1015    PT Stop Time  1053    PT Time Calculation (min)  38 min    Activity Tolerance  Patient tolerated treatment well;Patient limited by pain    Behavior During Therapy  [facility] for tasks assessed/performed       Past Medical History:  Diagnosis Date  . Anxiety   . Arthritis    "left knee" (08/05/2016)  . Bruises easily   . CAD (coronary artery disease) of artery bypass graft 08/05/2016   Around age 77. CABG - 3 vessel.   . Chronic kidney disease   . Colon polyp   . COPD, severe ([place]) 08/23/2016   Alpha 1 studies normal per prior pulmonary group notes Smoked 40 years, quit in 2012 June 2016 PFT from prior pulmonary group: "significant obstruction" FEV1 1.58L (47% pred), Residual volume 171% pred, DLCO 59% pred Simple Spirometry>> 09/15/2016 ratio 42% FEV1 1.02 L / 31%   . Essential tremor 06/11/2016   Plans to see Dr. Carles Collet  . Former smoker 09/01/2016   Quit 2012. 40 pack years at least.   . GERD (gastroesophageal reflux disease)   . Headache   . History of blood transfusion 08/1997   "when he had his heart surgery"  . History of shingles 1970-2013 X 3  . HTN (hypertension) 08/05/2016   Amlodipine 51m, atenolol 546mBID, benazepril 2028m. Hyperlipemia   . Hypothyroidism   . Lumbar disc disease   . Myocardial infarction ([facility]/1998  . Peripheral vascular disease ([place] . PONV (postoperative nausea and vomiting)   . Urinary tract infection 09/03/2017  . Wears glasses     Past  Surgical History:  Procedure Laterality Date  . AORTOGRAM Right 08/31/2017   Procedure: AORTOGRAM BILATERAL PELVIC ANGIOGRAM WITH LEFT ILIAC ARTERY STENT;  Surgeon: CheConrad BurlingtonD;  [facility];  Laterality: Right;  . BACK SURGERY    . CARDIAC CATHETERIZATION  08/1997  . CORONARY ARTERY BYPASS GRAFT  09/12/1997   "triple"  . INGUINAL HERNIA REPAIR Right 1961  . KIDNEY REMOVED    . KNEE RECONSTRUCTION Left 1960s - 1974 X 4  . LAPAROSCOPIC ABLATION RENAL MASS  ~ 2014   "large abscess/pus ball"  . [place]"they were wedged in the bowel"  . PATELLA FRACTURE SURGERY Left 1963  . PATELLA FRACTURE SURGERY Left ~ 1965   "removed knee cap"  . ROBOT ASSISTED LAPAROSCOPIC NEPHRECTOMY Left 10/07/2017   Procedure: XI ROBOTIC ASSISTED LAPAROSCOPIC NEPHRECTOMY OF PELVIC KIDNEY;  Surgeon: ManAlexis FrockD;  Location[facility];  Service: Urology;  Laterality: Left;  Place robot patient tower at foot of bed like prostate per Dr. ManTresa Mooreull extra staple loads.  . TONSILLECTOMY      There were no vitals filed for this visit.  Subjective Assessment - 11/27/18 1052    Subjective  Pt states improving pain and ROM.  Currently in Pain?  No/denies    Pain Score  0-No pain         [facility] PT Assessment - 11/27/18 0001      AROM   Left Shoulder Flexion  140 Degrees    Left Shoulder ABduction  150 Degrees    Left Shoulder Internal Rotation  70 Degrees    Left Shoulder External Rotation  60 Degrees      PROM   Left Shoulder Flexion  150 Degrees    Left Shoulder ABduction  170 Degrees    Left Shoulder Internal Rotation  70 Degrees    Left Shoulder External Rotation  60 Degrees      Strength   Left Shoulder Flexion  4+/5    Left Shoulder ABduction  4+/5    Left Shoulder Internal Rotation  5/5    Left Shoulder External Rotation  4+/5                   [facility] Adult PT Treatment/Exercise - 11/27/18 1032      Exercises   Exercises   Shoulder      Shoulder Exercises: Supine   Horizontal ABduction  20 reps;AROM    Horizontal ABduction Weight (lbs)  2lb    External Rotation  20 reps;AROM    External Rotation Limitations  cane    Flexion  20 reps;AROM    Shoulder Flexion Weight (lbs)  2lb    Other Supine Exercises  Pec stretch 30 sec x3      Shoulder Exercises: Sidelying   External Rotation  20 reps      Shoulder Exercises: Standing   External Rotation  20 reps    Theraband Level (Shoulder External Rotation)  Level 3 (Green)    Internal Rotation  20 reps    Theraband Level (Shoulder Internal Rotation)  Level 3 (Green)    Row  20 reps    Theraband Level (Shoulder Row)  Level 3 (Green)    Other Standing Exercises  AROM: scap and abd x20 each bil; x10 to 90 deg, x10 full motion    Other Standing Exercises  Wall slides x20, flexion; and circles      Shoulder Exercises: Pulleys   Flexion  --    Scaption  --      Shoulder Exercises: Stretch   Corner Stretch  3 reps;30 seconds      Manual Therapy   Manual Therapy  Soft tissue mobilization    Joint Mobilization  L GHJ all motions    Soft tissue mobilization  --    Passive ROM  L shoulder, all motions               PT Short Term Goals - 11/27/18 1052      PT SHORT TERM GOAL #1   Title  Pt to be independent with initial HEP     Time  2    Period  Weeks    Status  Achieved      PT SHORT TERM GOAL #2   Title  Pt to demo improved L shoulder PROM by at least 10 degrees for flexion and ER.    Time  3    Period  Weeks    Status  Achieved        PT Long Term Goals - 11/10/18 1210      PT LONG TERM GOAL #1   Title  Pt to be independent with final HEP     Time  6  Period  Weeks    Status  New    Target Date  12/22/18      PT LONG TERM GOAL #2   Title  Pt to report decreased pain in L shoulder, to 0-2/10 with activity     Time  6    Period  Weeks    Status  New    Target Date  12/22/18      PT LONG TERM GOAL #3   Title  Pt to demo  improved L shoulder AROM to [facility], to improve ability for ADLs and IADLs.     Time  6    Period  Weeks    Status  New    Target Date  12/22/18      PT LONG TERM GOAL #4   Title  Pt to demo improved strength of L shoulder to be at least 4+/5 to improve ability for lifting, reaching, carrying, and IADls     Time  6    Period  Weeks    Status  New    Target Date  12/22/18            Plan - 11/27/18 1054    Clinical Impression Statement  Pt making good progress. Plan to d/c at next visit, will review final HEP next visit, and issue handout. Pt with much improved ROM and pain with regular activities. He does have pain with end range flexion and ER, but all motions are improving.     Rehab Potential  Good    Clinical Impairments Affecting Rehab Potential  Pt to be seen at 3x/wk due to need for high frequency ROM following manipulation.     PT Frequency  3x / week    PT Duration  6 weeks    PT Treatment/Interventions  ADLs/Self Care Home Management;Cryotherapy;Electrical Stimulation;Iontophoresis 34m/ml Dexamethasone;Functional mobility training;Ultrasound;Moist Heat;Therapeutic activities;Therapeutic exercise;Neuromuscular re-education;Patient/family education;Passive range of motion;Manual techniques;Dry needling    Consulted and Agree with Plan of Care  Patient       Patient will benefit from skilled therapeutic intervention in order to improve the following deficits and impairments:  Decreased range of motion, Impaired UE functional use, Increased muscle spasms, Decreased activity tolerance, Pain, Improper body mechanics, Impaired flexibility, Hypomobility, Decreased mobility, Decreased strength  Visit Diagnosis: Chronic left shoulder pain  Stiffness of left shoulder, not elsewhere classified     Problem List Patient Active Problem List   Diagnosis Date Noted  . MGUS (monoclonal gammopathy of unknown significance) 08/02/2018  . Osteoporosis 06/26/2018  . Perennial allergic  rhinitis 01/24/2018  . 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 Urinary tract infection 09/03/2017  . Constipation 09/03/2017  . Anxiety 07/24/2017  . Atherosclerosis of native arteries of extremity with intermittent claudication ([place] 07/06/2017  . Pyelonephritis 04/23/2017  . Weight loss 04/23/2017  . History of adenomatous polyp of colon 04/22/2017  . Aortic atherosclerosis ([place] 04/13/2017  . Hyperlipidemia 04/04/2017  . Hypothyroidism (acquired) 04/04/2017  . Seasonal allergies 04/04/2017  . Myalgia 10/04/2016  . Former smoker 09/01/2016  . COPD, severe ([place] 08/23/2016  . Dysuria 08/23/2016  . Chronic pain of both knees 08/23/2016  . Hx of CABG 08/05/2016  . CAD (coronary artery disease) of artery bypass graft 08/05/2016  . Essential hypertension 08/05/2016  . Essential tremor 06/11/2016  . Tension headache 06/11/2016    LLyndee Hensen PT, DPT 10:56 AM  11/27/18    [facility] [id number] [place]  [address] Phone: [phone]   Fax:  [phone]  Name: Brian Graham MRN: [id number] Date of Birth: 16-Jul-1951     PHYSICAL THERAPY DISCHARGE SUMMARY  Visits from Start of Care:7   Plan: Patient agrees to discharge.  Patient goals were partially met. Patient is being discharged due to not returning since the last visit.  ?????      Pt having other medical issues that he was hospitalized for, did not return to PT.   Lyndee Hensen, PT, DPT 10:59 AM  02/06/19

## 2018-11-28 ENCOUNTER — Ambulatory Visit (INDEPENDENT_AMBULATORY_CARE_PROVIDER_SITE_OTHER): Payer: Medicare HMO

## 2018-11-28 ENCOUNTER — Ambulatory Visit: Payer: Medicare HMO | Admitting: Family Medicine

## 2018-11-28 ENCOUNTER — Encounter: Payer: Self-pay | Admitting: Family Medicine

## 2018-11-28 VITALS — BP 138/70 | HR 60 | Temp 98.0°F | Ht 69.0 in | Wt 154.2 lb

## 2018-11-28 DIAGNOSIS — R140 Abdominal distension (gaseous): Secondary | ICD-10-CM

## 2018-11-28 DIAGNOSIS — R6881 Early satiety: Secondary | ICD-10-CM

## 2018-11-28 DIAGNOSIS — K5900 Constipation, unspecified: Secondary | ICD-10-CM | POA: Diagnosis not present

## 2018-11-28 DIAGNOSIS — R10819 Abdominal tenderness, unspecified site: Secondary | ICD-10-CM

## 2018-11-28 DIAGNOSIS — R82998 Other abnormal findings in urine: Secondary | ICD-10-CM

## 2018-11-28 DIAGNOSIS — R944 Abnormal results of kidney function studies: Secondary | ICD-10-CM | POA: Diagnosis not present

## 2018-11-28 LAB — URINALYSIS, ROUTINE W REFLEX MICROSCOPIC
Bilirubin Urine: NEGATIVE
Ketones, ur: NEGATIVE
Leukocytes, UA: NEGATIVE
Nitrite: NEGATIVE
Specific Gravity, Urine: 1.02 (ref 1.000–1.030)
Total Protein, Urine: 100 — AB
Urine Glucose: NEGATIVE
Urobilinogen, UA: 0.2 (ref 0.0–1.0)
pH: 7.5 (ref 5.0–8.0)

## 2018-11-28 LAB — COMPREHENSIVE METABOLIC PANEL
ALBUMIN: 4.3 g/dL (ref 3.5–5.2)
ALT: 16 U/L (ref 0–53)
AST: 15 U/L (ref 0–37)
Alkaline Phosphatase: 44 U/L (ref 39–117)
BILIRUBIN TOTAL: 0.8 mg/dL (ref 0.2–1.2)
BUN: 20 mg/dL (ref 6–23)
CALCIUM: 9.9 mg/dL (ref 8.4–10.5)
CO2: 33 mEq/L — ABNORMAL HIGH (ref 19–32)
Chloride: 100 mEq/L (ref 96–112)
Creatinine, Ser: 1.59 mg/dL — ABNORMAL HIGH (ref 0.40–1.50)
GFR: 46.19 mL/min — ABNORMAL LOW (ref >60.00)
Glucose, Bld: 97 mg/dL (ref 70–99)
Potassium: 4.7 mEq/L (ref 3.5–5.1)
Sodium: 139 mEq/L (ref 135–145)
TOTAL PROTEIN: 7.2 g/dL (ref 6.0–8.3)

## 2018-11-28 LAB — CBC WITH DIFFERENTIAL/PLATELET
Basophils Absolute: 0 10*3/uL (ref 0.0–0.1)
Basophils Relative: 0.5 % (ref 0.0–3.0)
Eosinophils Absolute: 0.4 10*3/uL (ref 0.0–0.7)
Eosinophils Relative: 4 % (ref 0.0–5.0)
HEMATOCRIT: 44.7 % (ref 39.0–52.0)
Hemoglobin: 14.7 g/dL (ref 13.0–17.0)
Lymphocytes Relative: 16.5 % (ref 12.0–46.0)
Lymphs Abs: 1.4 10*3/uL (ref 0.7–4.0)
MCHC: 32.9 g/dL (ref 30.0–36.0)
MCV: 95 fl (ref 78.0–100.0)
Monocytes Absolute: 0.9 10*3/uL (ref 0.1–1.0)
Monocytes Relative: 9.7 % (ref 3.0–12.0)
Neutro Abs: 6.1 10*3/uL (ref 1.4–7.7)
Neutrophils Relative %: 69.3 % (ref 43.0–77.0)
Platelets: 232 10*3/uL (ref 150.0–400.0)
RBC: 4.7 Mil/uL (ref 4.22–5.81)
RDW: 14.2 % (ref 11.5–15.5)
WBC: 8.8 10*3/uL (ref 4.0–10.5)

## 2018-11-28 NOTE — Patient Instructions (Addendum)
Phq2?  Please stop by lab before you go.  I am also going to get an x-ray to look at the stool burden.  Definitely getting urine culture given history of similar symptoms with urinary tract infections  I do not think your hernia is the cause.  I do think constipation could be the cause but I will reach out to Dr. Bess Harvest before suggesting MiraLAX firmly.  Can trial FD GARD over-the-counter.  It is $30 for 36 tablets on their website-it looks like it is also available at [facility] and [facility]

## 2018-11-28 NOTE — Progress Notes (Signed)
Subjective:  Brian Graham is a 68 y.o. year old very pleasant male patient who presents for/with See problem oriented charting ROS-  Left shoulder is doing better and feels well overall.  No fever or chills.  Does have early satiety and a lot of bloating.  Complains of constipation.  Not getting significant abdominal pain  Past Medical History-  Patient Active Problem List   Diagnosis Date Noted  . Osteoporosis 06/26/2018    Priority: High  . Atherosclerosis of native arteries of extremity with intermittent claudication ([place]) 07/06/2017    Priority: High  . Weight loss 04/23/2017    Priority: High  . Former smoker 09/01/2016    Priority: High  . COPD, severe ([place]) 08/23/2016    Priority: High  . Hx of CABG 08/05/2016    Priority: High  . CAD (coronary artery disease) of artery bypass graft 08/05/2016    Priority: High  . History of adenomatous polyp of colon 04/22/2017    Priority: Medium  . Hyperlipidemia 04/04/2017    Priority: Medium  . Hypothyroidism (acquired) 04/04/2017    Priority: Medium  . Essential hypertension 08/05/2016    Priority: Medium  . Essential tremor 06/11/2016    Priority: Medium  . Aortic atherosclerosis ([place]) 04/13/2017    Priority: Low  . Seasonal allergies 04/04/2017    Priority: Low  . Myalgia 10/04/2016    Priority: Low  . Dysuria 08/23/2016    Priority: Low  . Chronic pain of both knees 08/23/2016    Priority: Low  . Tension headache 06/11/2016    Priority: Low  . MGUS (monoclonal gammopathy of unknown significance) 08/02/2018  . Perennial allergic rhinitis 01/24/2018  . 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 Urinary tract infection 09/03/2017  . Constipation 09/03/2017  . Anxiety 07/24/2017  . Pyelonephritis 04/23/2017    Medications- reviewed and updated Current Outpatient Medications  Medication Sig Dispense Refill  . acetaminophen (TYLENOL) 500 MG tablet Take 1,000 mg by mouth every 6  (six) hours as needed for mild pain.    Marland Kitchen albuterol (PROVENTIL HFA;VENTOLIN HFA) 108 (90 Base) MCG/ACT inhaler Inhale 2 puffs into the lungs every 6 (six) hours as needed for wheezing or shortness of breath. 1 Inhaler 5  . albuterol (PROVENTIL) (2.5 MG/3ML) 0.083% nebulizer solution Take 3 mLs (2.5 mg total) by nebulization every 6 (six) hours as needed for wheezing or shortness of breath. DX: J44.9 300 mL 11  . alendronate (FOSAMAX) 70 MG tablet Take 1 tablet (70 mg total) by mouth every 7 (seven) days. Take with a full glass of water on an empty stomach. 13 tablet 3  . amLODipine (NORVASC) 5 MG tablet TAKE 1 TABLET DAILY 90 tablet 3  . aspirin EC 81 MG tablet Take 81 mg by mouth daily.    Marland Kitchen atenolol (TENORMIN) 50 MG tablet TAKE 1 TABLET DAILY 90 tablet 3  . atorvastatin (LIPITOR) 80 MG tablet TAKE 1 TABLET DAILY (NEEDS OFFICE VISIT BEFORE ADDITIONAL REFILLS)    . benazepril (LOTENSIN) 20 MG tablet TAKE 1 TABLET DAILY 90 tablet 4  . budesonide-formoterol (SYMBICORT) 160-4.5 MCG/ACT inhaler USE 2 INHALATIONS TWICE A DAY 3 Inhaler 3  . Misc. Devices (PULSE OXIMETER) MISC 1 application by Does not apply route as needed. 1 each 0  . ondansetron (ZOFRAN-ODT) 4 MG disintegrating tablet PLACE 1 TO 2 TABLETS ON TONGUE AND ALLOW TO DISSOLVE EVERY 8 HOURS AS NEEDED FOR NAUSEA / VOMITING 30 tablet 0  . rosuvastatin (  CRESTOR) 40 MG tablet Take 1 tablet (40 mg total) by mouth daily. 90 tablet 3  . SPIRIVA RESPIMAT 2.5 MCG/ACT AERS USE 2 INHALATIONS DAILY 12 g 3  . SYNTHROID 125 MCG tablet Take 1 tablet (125 mcg total) by mouth daily before breakfast. 90 tablet 3  . traMADol (ULTRAM) 50 MG tablet Take 1 tablet (50 mg total) by mouth every 6 (six) hours as needed for moderate pain or severe pain. 30 tablet 5   No current facility-administered medications for this visit.     Objective: BP 138/70 (BP Location: Left Arm, Patient Position: Sitting, Cuff Size: Large)   Pulse 60   Temp 98 F (36.7 C) (Oral)    Ht 5\' 9"  (1.753 m)   Wt 154 lb 3.2 oz (69.9 kg)   SpO2 96%   BMI 22.77 kg/m  Gen: NAD, resting comfortably CV: RRR no murmurs rubs or gallops Lungs: CTAB no crackles, wheeze, rhonchi Abdomen: soft/nontender other than mild suprapubic tenderness/nondistended/normal bowel sounds. No rebound or guarding.  Ext: no edema Skin: warm, dry  Assessment/Plan:  Suprapubic tenderness - Plan: Urine Culture, Urinalysis, Urinalysis  Early satiety - Plan: CBC with Differential/Platelet, Comprehensive metabolic panel, DG Abd 1 View  Constipation, unspecified constipation type - Plan: CBC with Differential/Platelet, Comprehensive metabolic panel, DG Abd 1 View  Bloating - Plan: CBC with Differential/Platelet, Comprehensive metabolic panel, DG Abd 1 View S:  Used to be nauseous years ago everyday at 10 AM - was evaluated by GI at the time and had colonoscopy and EGD and reassuring findings. Ultimately isues found to be related to UTI/urinary issues. Has had similar issues with UTI recurrences since that time.   Starting a month ago noted similar symptoms with dry heaves around 10 AM each day. Ondansetron helps but then comes back the next day. Usually eats lunch and dinner- has been eating half of what normally eats but has only lost 1 l. Has an empty feeling in his stomach/ a lot of burping and gurgling in stomach as well as loud burping in particular. Also has more flatulence.  Not much abdominal pain with this-does have some suprapubic tenderness though  Has seen Dr. Tresa Moore around beginning of this and did a urine which was negative for infection and also had kidney test/blood work which was reassuring.   Hernia is getting slightly larger but no pain in this area.  Has had a significant change in his bowel movements-used to go daily but now Up to 1.5 weeks between stooling.  Reportedly urologist Dr. Tresa Moore said no laxative. Hard and hard BM.Everyday taking [facility] brand stool softener- might be colace.  A/P:  68 year old man with suprapubic tenderness, bloating, early satiety, nausea particularly in the morning with decent improvement with ondansetron  -with suprapubic tenderness-we will get urinalysis as well as urine culture-particularly since patient has had issues with nausea in the past with urinary tract infections. -We will get updated CBC and CMP.  Considered lipase but doubt pancreatitis - I do wonder if his constipation could be causing the symptoms-would like to trial MiraLAX but have reached out to Dr. Tresa Moore first before starting this. - We will get abdominal films to assess stool burden. - We will trial FD gard over-the-counter for potential functional dyspepsia - if pain not related to constipatin (doesn't improve with treatment of this and no obvious findings with above workup- may need to refer back to GI for early colonoscopy. Last colonoscopy 2013 - could also update TSH (thought about after blood  already drawn)  Future Appointments  Date Time Provider [place]  11/29/2018 10:15 AM Lyndee Hensen, PT [facility]  01/24/2019 11:00 AM [facility] None  01/31/2019 10:00 AM Nicholas Lose, MD [facility] None   Lab/Order associations: Suprapubic tenderness - Plan: Urine Culture, Urinalysis, Urinalysis  Early satiety - Plan: CBC with Differential/Platelet, Comprehensive metabolic panel, DG Abd 1 View  Constipation, unspecified constipation type - Plan: CBC with Differential/Platelet, Comprehensive metabolic panel, DG Abd 1 View  Bloating - Plan: CBC with Differential/Platelet, Comprehensive metabolic panel, DG Abd 1 View  Return precautions advised.  Garret Reddish, MD

## 2018-11-28 NOTE — Addendum Note (Signed)
Addended by: [name] on: 11/28/2018 08:08 PM   Modules accepted: Orders

## 2018-11-29 ENCOUNTER — Ambulatory Visit
Admission: RE | Admit: 2018-11-29 | Discharge: 2018-11-29 | Disposition: A | Discharge status: Home / Self Care | Payer: Medicare HMO | Source: Ambulatory Visit | Attending: Family Medicine | Admitting: Family Medicine

## 2018-11-29 ENCOUNTER — Encounter: Payer: Medicare Other | Admitting: Physical Therapy

## 2018-11-29 DIAGNOSIS — R82998 Other abnormal findings in urine: Secondary | ICD-10-CM

## 2018-11-29 DIAGNOSIS — R944 Abnormal results of kidney function studies: Secondary | ICD-10-CM

## 2018-11-29 DIAGNOSIS — Z905 Acquired absence of kidney: Secondary | ICD-10-CM | POA: Diagnosis not present

## 2018-11-29 LAB — URINE CULTURE
MICRO NUMBER:: 21922
SPECIMEN QUALITY:: ADEQUATE

## 2018-11-30 ENCOUNTER — Other Ambulatory Visit: Payer: Self-pay

## 2018-11-30 ENCOUNTER — Encounter: Payer: Medicare HMO | Admitting: Physical Therapy

## 2018-11-30 DIAGNOSIS — R7989 Other specified abnormal findings of blood chemistry: Secondary | ICD-10-CM

## 2018-12-01 ENCOUNTER — Other Ambulatory Visit: Payer: Medicare HMO

## 2018-12-04 ENCOUNTER — Encounter (HOSPITAL_COMMUNITY): Payer: Self-pay

## 2018-12-04 ENCOUNTER — Telehealth: Payer: Self-pay | Admitting: Radiology

## 2018-12-04 ENCOUNTER — Inpatient Hospital Stay (HOSPITAL_COMMUNITY)
Admission: EM | Admit: 2018-12-04 | Discharge: 2018-12-08 | DRG: 683 | Disposition: A | Discharge status: Home / Self Care | Payer: Medicare HMO | Attending: Internal Medicine | Admitting: Internal Medicine

## 2018-12-04 ENCOUNTER — Telehealth: Payer: Self-pay | Admitting: Family Medicine

## 2018-12-04 ENCOUNTER — Other Ambulatory Visit: Payer: Self-pay

## 2018-12-04 ENCOUNTER — Emergency Department (HOSPITAL_COMMUNITY): Payer: Medicare HMO

## 2018-12-04 ENCOUNTER — Other Ambulatory Visit (INDEPENDENT_AMBULATORY_CARE_PROVIDER_SITE_OTHER): Payer: Medicare HMO

## 2018-12-04 DIAGNOSIS — Z95828 Presence of other vascular implants and grafts: Secondary | ICD-10-CM

## 2018-12-04 DIAGNOSIS — D472 Monoclonal gammopathy: Secondary | ICD-10-CM | POA: Diagnosis present

## 2018-12-04 DIAGNOSIS — Z87891 Personal history of nicotine dependence: Secondary | ICD-10-CM | POA: Diagnosis not present

## 2018-12-04 DIAGNOSIS — J3089 Other allergic rhinitis: Secondary | ICD-10-CM | POA: Diagnosis not present

## 2018-12-04 DIAGNOSIS — R7989 Other specified abnormal findings of blood chemistry: Secondary | ICD-10-CM | POA: Diagnosis not present

## 2018-12-04 DIAGNOSIS — I25810 Atherosclerosis of coronary artery bypass graft(s) without angina pectoris: Secondary | ICD-10-CM | POA: Diagnosis not present

## 2018-12-04 DIAGNOSIS — G250 Essential tremor: Secondary | ICD-10-CM | POA: Diagnosis present

## 2018-12-04 DIAGNOSIS — I700 Atherosclerosis of aorta: Secondary | ICD-10-CM | POA: Diagnosis not present

## 2018-12-04 DIAGNOSIS — Z882 Allergy status to sulfonamides status: Secondary | ICD-10-CM

## 2018-12-04 DIAGNOSIS — I739 Peripheral vascular disease, unspecified: Secondary | ICD-10-CM | POA: Diagnosis present

## 2018-12-04 DIAGNOSIS — Z905 Acquired absence of kidney: Secondary | ICD-10-CM | POA: Diagnosis not present

## 2018-12-04 DIAGNOSIS — J449 Chronic obstructive pulmonary disease, unspecified: Secondary | ICD-10-CM | POA: Diagnosis present

## 2018-12-04 DIAGNOSIS — I10 Essential (primary) hypertension: Secondary | ICD-10-CM | POA: Diagnosis present

## 2018-12-04 DIAGNOSIS — Z881 Allergy status to other antibiotic agents status: Secondary | ICD-10-CM

## 2018-12-04 DIAGNOSIS — N179 Acute kidney failure, unspecified: Secondary | ICD-10-CM | POA: Diagnosis present

## 2018-12-04 DIAGNOSIS — K219 Gastro-esophageal reflux disease without esophagitis: Secondary | ICD-10-CM | POA: Diagnosis present

## 2018-12-04 DIAGNOSIS — Z7982 Long term (current) use of aspirin: Secondary | ICD-10-CM | POA: Diagnosis not present

## 2018-12-04 DIAGNOSIS — Z79891 Long term (current) use of opiate analgesic: Secondary | ICD-10-CM

## 2018-12-04 DIAGNOSIS — E785 Hyperlipidemia, unspecified: Secondary | ICD-10-CM | POA: Diagnosis not present

## 2018-12-04 DIAGNOSIS — Z8619 Personal history of other infectious and parasitic diseases: Secondary | ICD-10-CM

## 2018-12-04 DIAGNOSIS — I252 Old myocardial infarction: Secondary | ICD-10-CM | POA: Diagnosis not present

## 2018-12-04 DIAGNOSIS — R69 Illness, unspecified: Secondary | ICD-10-CM | POA: Diagnosis not present

## 2018-12-04 DIAGNOSIS — Z79899 Other long term (current) drug therapy: Secondary | ICD-10-CM

## 2018-12-04 DIAGNOSIS — K529 Noninfective gastroenteritis and colitis, unspecified: Secondary | ICD-10-CM | POA: Diagnosis not present

## 2018-12-04 DIAGNOSIS — Z951 Presence of aortocoronary bypass graft: Secondary | ICD-10-CM

## 2018-12-04 DIAGNOSIS — Z973 Presence of spectacles and contact lenses: Secondary | ICD-10-CM | POA: Diagnosis not present

## 2018-12-04 DIAGNOSIS — M810 Age-related osteoporosis without current pathological fracture: Secondary | ICD-10-CM | POA: Diagnosis present

## 2018-12-04 DIAGNOSIS — Z79890 Hormone replacement therapy (postmenopausal): Secondary | ICD-10-CM

## 2018-12-04 DIAGNOSIS — Z7951 Long term (current) use of inhaled steroids: Secondary | ICD-10-CM

## 2018-12-04 DIAGNOSIS — E876 Hypokalemia: Secondary | ICD-10-CM | POA: Diagnosis present

## 2018-12-04 DIAGNOSIS — E039 Hypothyroidism, unspecified: Secondary | ICD-10-CM | POA: Diagnosis present

## 2018-12-04 DIAGNOSIS — F419 Anxiety disorder, unspecified: Secondary | ICD-10-CM | POA: Diagnosis present

## 2018-12-04 DIAGNOSIS — N2889 Other specified disorders of kidney and ureter: Secondary | ICD-10-CM | POA: Diagnosis not present

## 2018-12-04 DIAGNOSIS — Z888 Allergy status to other drugs, medicaments and biological substances status: Secondary | ICD-10-CM

## 2018-12-04 DIAGNOSIS — Z811 Family history of alcohol abuse and dependence: Secondary | ICD-10-CM

## 2018-12-04 LAB — URINALYSIS, ROUTINE W REFLEX MICROSCOPIC
BILIRUBIN URINE: NEGATIVE
Glucose, UA: NEGATIVE mg/dL
Ketones, ur: NEGATIVE mg/dL
Leukocytes, UA: NEGATIVE
Nitrite: NEGATIVE
Protein, ur: NEGATIVE mg/dL
Specific Gravity, Urine: 1.011 (ref 1.005–1.030)
pH: 5 (ref 5.0–8.0)

## 2018-12-04 LAB — BASIC METABOLIC PANEL
Anion gap: 12 (ref 5–15)
BUN: 67 mg/dL — ABNORMAL HIGH (ref 6–23)
BUN: 68 mg/dL — AB (ref 8–23)
CO2: 27 mEq/L (ref 19–32)
CO2: 25 mmol/L (ref 22–32)
Calcium: 8.6 mg/dL (ref 8.4–10.5)
Calcium: 8.4 mg/dL — ABNORMAL LOW (ref 8.9–10.3)
Chloride: 98 mEq/L (ref 96–112)
Chloride: 99 mmol/L (ref 98–111)
Creatinine, Ser: 7.12 mg/dL (ref 0.40–1.50)
Creatinine, Ser: 7.22 mg/dL — ABNORMAL HIGH (ref 0.61–1.24)
GFR calc Af Amer: 8 mL/min — ABNORMAL LOW (ref >60)
GFR calc non Af Amer: 7 mL/min — ABNORMAL LOW (ref >60)
GFR: 8.2 mL/min — CL (ref >60.00)
Glucose, Bld: 148 mg/dL — ABNORMAL HIGH (ref 70–99)
Glucose, Bld: 109 mg/dL — ABNORMAL HIGH (ref 70–99)
Potassium: 3.8 mEq/L (ref 3.5–5.1)
Potassium: 3.4 mmol/L — ABNORMAL LOW (ref 3.5–5.1)
SODIUM: 139 meq/L (ref 135–145)
Sodium: 136 mmol/L (ref 135–145)

## 2018-12-04 LAB — CBC
HEMATOCRIT: 43.5 % (ref 39.0–52.0)
Hemoglobin: 13.9 g/dL (ref 13.0–17.0)
MCH: 30 pg (ref 26.0–34.0)
MCHC: 32 g/dL (ref 30.0–36.0)
MCV: 93.8 fL (ref 80.0–100.0)
Platelets: 255 10*3/uL (ref 150–400)
RBC: 4.64 MIL/uL (ref 4.22–5.81)
RDW: 13.9 % (ref 11.5–15.5)
WBC: 8.8 10*3/uL (ref 4.0–10.5)
nRBC: 0 % (ref 0.0–0.2)

## 2018-12-04 LAB — CREATININE, URINE, RANDOM: Creatinine, Urine: 133.07 mg/dL

## 2018-12-04 LAB — NA AND K (SODIUM & POTASSIUM), RAND UR
Potassium Urine: 25 mmol/L
Sodium, Ur: 35 mmol/L

## 2018-12-04 LAB — MICROALBUMIN / CREATININE URINE RATIO
Creatinine,U: 85 mg/dL
Microalb Creat Ratio: 5.1 mg/g (ref 0.0–30.0)
Microalb, Ur: 4.3 mg/dL — ABNORMAL HIGH (ref 0.0–1.9)

## 2018-12-04 MED ORDER — SODIUM CHLORIDE 0.9 % IV BOLUS
1000.0000 mL | Freq: Once | INTRAVENOUS | Status: AC
  Administered 2018-12-04: 1000 mL via INTRAVENOUS

## 2018-12-04 NOTE — Telephone Encounter (Signed)
Called and spoke with patient. I advised him to go to [facility] ED for evaluation. He verbalized understanding and stated he would go.

## 2018-12-04 NOTE — ED Triage Notes (Signed)
Pt her after PCP called him stating kidney function tests were abnormal.  Hx of left kidney removal in 2018.  Creatine from labs is 7.12 increased from 1.59.  A&Ox4 with occasional flank pain.

## 2018-12-04 NOTE — Telephone Encounter (Signed)
Needs to go to hospital for acute kidney injury- kidney function has worsened and we need to evaluate more quickly than we can on outpatient basis

## 2018-12-04 NOTE — Telephone Encounter (Signed)
CRITICAL VALUE STICKER  CRITICAL VALUE: Creatinine 7.12          GFR 8.20  RECEIVER (on-site recipient of call): Kayren Eaves  DATE & TIME NOTIFIED: 12/04/2018 @ 1546 hrs  MESSENGER (representative from lab): Hope   MD NOTIFIED: Garret Reddish   TIME OF NOTIFICATION: 0254 hrs   RESPONSE:

## 2018-12-04 NOTE — Telephone Encounter (Signed)
See note  Copied from [place] [phone]. Topic: General - Other >> Dec 04, 2018  4:19 PM Virl Axe D wrote: Reason for CRM: Pt called back wanting to speak with Roselyn Reef again. Please advise.

## 2018-12-04 NOTE — ED Notes (Signed)
Patient transported to CT 

## 2018-12-04 NOTE — ED Provider Notes (Signed)
[facility] EMERGENCY DEPARTMENT Provider Note   CSN: [id number] Arrival date & time: 12/04/18  1703     History   Chief Complaint Chief Complaint  Patient presents with  . Abnormal Lab    HPI Brian Graham is a 68 y.o. male.  68 yo M with a chief complaint of worsening renal function.  The patient had seen his PCP in the office and found that his creatinine had significantly increased.  Prior to this the patient had been ill with diarrheal illness.  Lasted for about a week, he felt extremely weak and had difficulty getting up and moving around.  Also had significantly decreased oral intake.  He was having some mild lower abdominal cramping.  Initially this was all preceded by constipation and the patient had taken some laxatives.  Now his diarrhea has resolved and he has been able to take some oral intake but less than normal.  He denies continued abdominal pain.  Last had about 2 days ago.  Denies difficulty with urination denies feeling like he does not empty his bladder completely.  He denies flank pain.  He had one kidney removed earlier this year.  The history is provided by the patient.  Abnormal Lab  Illness  Severity:  Moderate Onset quality:  Sudden Duration:  1 week Timing:  Constant Progression:  Worsening Chronicity:  New Associated symptoms: abdominal pain and diarrhea   Associated symptoms: no chest pain, no congestion, no fever, no headaches, no myalgias, no rash, no shortness of breath and no vomiting     Past Medical History:  Diagnosis Date  . Anxiety   . Arthritis    "left knee" (08/05/2016)  . Bruises easily   . CAD (coronary artery disease) of artery bypass graft 08/05/2016   Around age 25. CABG - 3 vessel.   . Chronic kidney disease   . Colon polyp   . 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 Essential tremor 06/11/2016   Plans to see Dr. Carles Collet  . Former smoker 09/01/2016   Quit 2012. 40 pack years at least.   . GERD (gastroesophageal reflux disease)   . Headache   . History of blood transfusion 08/1997   "when he had his heart surgery"  . History of shingles 1970-2013 X 3  . HTN (hypertension) 08/05/2016   Amlodipine 5mg , atenolol 50mg  BID, benazepril 20mg   . Hyperlipemia   . Hypothyroidism   . Lumbar disc disease   . Myocardial infarction ([place]) 08/1997  . Peripheral vascular disease ([place])   . PONV (postoperative nausea and vomiting)   . Urinary tract infection 09/03/2017  . Wears glasses     Patient Active Problem List   Diagnosis Date Noted  . MGUS (monoclonal gammopathy of unknown significance) 08/02/2018  . Osteoporosis 06/26/2018  . Perennial allergic rhinitis 01/24/2018  . 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 Urinary tract infection 09/03/2017  . Constipation 09/03/2017  . Anxiety 07/24/2017  . Atherosclerosis of native arteries of extremity with intermittent claudication ([place]) 07/06/2017  . Pyelonephritis 04/23/2017  . Weight loss 04/23/2017  . History of adenomatous polyp of colon 04/22/2017  . Aortic atherosclerosis ([place]) 04/13/2017  . Hyperlipidemia  04/04/2017  . Hypothyroidism (acquired) 04/04/2017  . Seasonal allergies 04/04/2017  . Myalgia 10/04/2016  . Former smoker 09/01/2016  . 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 Dysuria 08/23/2016  . Chronic pain of both knees 08/23/2016  . Hx of CABG 08/05/2016  . CAD (coronary artery disease) of artery bypass graft 08/05/2016  . Essential hypertension 08/05/2016  . Essential tremor 06/11/2016  . Tension headache 06/11/2016    Past Surgical History:  Procedure Laterality Date  . AORTOGRAM Right 08/31/2017   Procedure: AORTOGRAM  BILATERAL PELVIC ANGIOGRAM WITH LEFT ILIAC ARTERY STENT;  Surgeon: Conrad Lombard, MD;  Location: [facility];  Service: Vascular;  Laterality: Right;  . BACK SURGERY    . CARDIAC CATHETERIZATION  08/1997  . CORONARY ARTERY BYPASS GRAFT  09/12/1997   "triple"  . INGUINAL HERNIA REPAIR Right 1961  . KIDNEY REMOVED    . KNEE RECONSTRUCTION Left 1960s - 1974 X 4  . LAPAROSCOPIC ABLATION RENAL MASS  ~ 2014   "large abscess/pus ball"  . [place]   "they were wedged in the bowel"  . PATELLA FRACTURE SURGERY Left 1963  . PATELLA FRACTURE SURGERY Left ~ 1965   "removed knee cap"  . ROBOT ASSISTED LAPAROSCOPIC NEPHRECTOMY Left 10/07/2017   Procedure: XI ROBOTIC ASSISTED LAPAROSCOPIC NEPHRECTOMY OF PELVIC KIDNEY;  Surgeon: Alexis Frock, MD;  Location: [facility];  Service: Urology;  Laterality: Left;  Place robot patient tower at foot of bed like prostate per Dr. Tresa Moore. Pull extra staple loads.  . TONSILLECTOMY          Home Medications    Prior to Admission medications   Medication Sig Start Date End Date Taking? Authorizing Provider  acetaminophen (TYLENOL) 500 MG tablet Take 500-1,000 mg by mouth every 6 (six) hours as needed (for pain).    Yes [provider]  albuterol (PROVENTIL HFA;VENTOLIN HFA) 108 (90 Base) MCG/ACT inhaler Inhale 2 puffs into the lungs every 6 (six) hours as needed for wheezing or shortness of breath. 04/03/18  Yes Juanito Doom, MD  albuterol (PROVENTIL) (2.5 MG/3ML) 0.083% nebulizer solution Take 3 mLs (2.5 mg total) by nebulization every 6 (six) hours as needed for wheezing or shortness of breath. DX: J44.9 04/19/18  Yes Juanito Doom, MD  alendronate (FOSAMAX) 70 MG tablet Take 1 tablet (70 mg total) by mouth every 7 (seven) days. Take with a full glass of water on an empty stomach. Patient taking differently: Take 70 mg by mouth every Sunday. Take with a full glass of water on an empty stomach. 07/18/18  Yes [name], MD    amLODipine (NORVASC) 5 MG tablet TAKE 1 TABLET DAILY Patient taking differently: Take 5 mg by mouth daily.  11/17/18  Yes [name], MD  aspirin EC 81 MG tablet Take 81 mg by mouth daily.   Yes [provider]  atenolol (TENORMIN) 50 MG tablet TAKE 1 TABLET DAILY Patient taking differently: Take 50 mg by mouth daily.  07/27/18  Yes [name], MD  benazepril (LOTENSIN) 20 MG tablet TAKE 1 TABLET DAILY Patient taking differently: Take 20 mg by mouth daily.  08/30/18  Yes [name], MD  budesonide-formoterol (SYMBICORT) 160-4.5 MCG/ACT inhaler USE 2 INHALATIONS TWICE A DAY Patient taking differently: Inhale 2 puffs into the lungs 2 (two) times daily.  09/25/18  Yes Juanito Doom, MD  Misc. Devices (PULSE OXIMETER) MISC 1 application by Does not apply route as needed. 09/21/17  Yes  Vivi Barrack, MD  ondansetron (ZOFRAN-ODT) 4 MG disintegrating tablet PLACE 1 TO 2 TABLETS ON TONGUE AND ALLOW TO DISSOLVE EVERY 8 HOURS AS NEEDED FOR NAUSEA / VOMITING Patient taking differently: Take 4-8 mg by mouth See admin instructions. PLACE 1 TO 2 TABLETS ON TONGUE AND ALLOW TO DISSOLVE EVERY 8 HOURS AS NEEDED FOR NAUSEA / VOMITING 01/17/18  Yes [name], MD  rosuvastatin (CRESTOR) 40 MG tablet Take 1 tablet (40 mg total) by mouth daily. Patient taking differently: Take 40 mg by mouth at bedtime.  06/15/18  Yes [name], MD  SPIRIVA RESPIMAT 2.5 MCG/ACT AERS USE 2 INHALATIONS DAILY Patient taking differently: Inhale 1 puff into the lungs 2 (two) times daily.  06/05/18  Yes Juanito Doom, MD  SYNTHROID 125 MCG tablet Take 1 tablet (125 mcg total) by mouth daily before breakfast. 02/20/18  Yes [name], MD  traMADol (ULTRAM) 50 MG tablet Take 1 tablet (50 mg total) by mouth every 6 (six) hours as needed for moderate pain or severe pain. 11/07/18  Yes [name], MD    Family History Family History  Problem Relation Age of Onset  . Alcohol  abuse Mother   . Alcohol abuse Father        not involved in life  . Alcoholism Sister   . Healthy Sister   . Healthy Sister   . Allergic rhinitis Neg Hx   . Angioedema Neg Hx   . Asthma Neg Hx   . Eczema Neg Hx   . Immunodeficiency Neg Hx   . Urticaria Neg Hx     Social History Social History   Tobacco Use  . Smoking status: Former Smoker    Packs/day: 1.00    Years: 45.00    Pack years: 45.00    Types: Cigarettes    Last attempt to quit: 07/10/2017    Years since quitting: 1.4  . Smokeless tobacco: Never Used  Substance Use Topics  . Alcohol use: Yes    Alcohol/week: 14.0 standard drinks    Types: 14 Shots of liquor per week    Comment: daily 2 shots of vodka  . Drug use: No     Allergies   Other; Keflex [cephalexin]; Sulfa antibiotics; Ciprofloxacin; Levaquin [levofloxacin]; and Omeprazole   Review of Systems Review of Systems  Constitutional: Negative for chills and fever.  HENT: Negative for congestion and facial swelling.   Eyes: Negative for discharge and visual disturbance.  Respiratory: Negative for shortness of breath.   Cardiovascular: Negative for chest pain and palpitations.  Gastrointestinal: Positive for abdominal pain and diarrhea. Negative for vomiting.  Musculoskeletal: Negative for arthralgias and myalgias.  Skin: Negative for color change and rash.  Neurological: Negative for tremors, syncope and headaches.  Psychiatric/Behavioral: Negative for confusion and dysphoric mood.     Physical Exam Updated Vital Signs BP 123/70   Pulse (!) 52   Temp 97.9 F (36.6 C) (Oral)   Resp 18   SpO2 98%   Physical Exam Vitals signs and nursing note reviewed.  Constitutional:      Appearance: He is well-developed.  HENT:     Head: Normocephalic and atraumatic.  Eyes:     Pupils: Pupils are equal, round, and reactive to light.  Neck:     Musculoskeletal: Normal range of motion and neck supple.     Vascular: No JVD.  Cardiovascular:     Rate  and Rhythm: Normal rate and regular rhythm.  Heart sounds: No murmur. No friction rub. No gallop.   Pulmonary:     Effort: No respiratory distress.     Breath sounds: No wheezing.  Abdominal:     General: There is no distension.     Tenderness: There is no guarding or rebound.  Musculoskeletal: Normal range of motion.  Skin:    Coloration: Skin is not pale.     Findings: No rash.  Neurological:     Mental Status: He is alert and oriented to person, place, and time.  Psychiatric:        Behavior: Behavior normal.      ED Treatments / Results  Labs (all labs ordered are listed, but only abnormal results are displayed) Labs Reviewed  BASIC METABOLIC PANEL - Abnormal; Notable for the following components:      Result Value   Potassium 3.4 (*)    Glucose, Bld 109 (*)    BUN 68 (*)    Creatinine, Ser 7.22 (*)    Calcium 8.4 (*)    GFR calc non Af Amer 7 (*)    GFR calc Af Amer 8 (*)    All other components within normal limits  URINALYSIS, ROUTINE W REFLEX MICROSCOPIC - Abnormal; Notable for the following components:   APPearance HAZY (*)    Hgb urine dipstick SMALL (*)    Bacteria, UA RARE (*)    All other components within normal limits  CBC  CREATININE, URINE, RANDOM  NA AND K (SODIUM & POTASSIUM), RAND UR    EKG None  Radiology Ct Renal Stone Study  Result Date: 12/04/2018 CLINICAL DATA:  Abnormal kidney function tests. Creatinine 7.1, increased from 1.6. History of left nephrectomy 2018. EXAM: CT ABDOMEN AND PELVIS WITHOUT CONTRAST TECHNIQUE: Multidetector CT imaging of the abdomen and pelvis was performed following the standard protocol without IV contrast. COMPARISON:  08/13/2017 FINDINGS: Lower chest: Lung bases are clear. Hepatobiliary: No focal liver abnormality is seen. No gallstones, gallbladder wall thickening, or biliary dilatation. Pancreas: Unremarkable. No pancreatic ductal dilatation or surrounding inflammatory changes. Spleen: Normal in size without  focal abnormality. Adrenals/Urinary Tract: No adrenal gland nodules. 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 Right kidney is normal in size. No hydronephrosis or hydroureter. No renal or ureteral stone. Bladder is unremarkable. Stomach/Bowel: Stomach is within normal limits. Appendix appears normal. No evidence of bowel wall thickening, distention, or inflammatory changes. Vascular/Lymphatic: Aortic atherosclerosis. No enlarged abdominal or pelvic lymph nodes. Reproductive: Prostate is unremarkable. Other: No free air or free fluid in the abdomen. Small subxiphoid ventral abdominal wall hernia containing fat. Musculoskeletal: Degenerative changes in the spine. No destructive bone lesions. IMPRESSION: 1. Resection of left kidney. 2. Right kidney is unremarkable. No evidence of renal or ureteral stone or obstruction. 3. Small ventral abdominal wall hernia containing fat. Aortic Atherosclerosis (ICD10-I70.0). Electronically Signed   By: Lucienne Capers M.D.   On: 12/04/2018 22:51    Procedures Procedures (including critical care time)  Medications Ordered in ED Medications  sodium chloride 0.9 % bolus 1,000 mL (1,000 mLs Intravenous New Bag/Given 12/04/18 2205)     Initial Impression / Assessment and Plan / ED Course  I have reviewed the triage vital signs and the nursing notes.  Pertinent labs & imaging results that were available during my care of the patient were reviewed by me and considered in my medical decision making (see chart for details).     68 yo M with a chief complaint of acute  kidney injury.  The patient had seen his family doctor after a diarrheal illness and was found to have worsening renal function.  He had it repeated today and had significantly worsened.  He was then sent to the ED for evaluation.  He had some lower abdominal pain earlier with a diarrheal illness that is resolved.  CT scan was performed that shows no obstruction to the solitary  kidney.  He is given a bolus of IV fluids.  Will discuss with the hospitalist for admission.  The patients results and plan were reviewed and discussed.   Any x-rays performed were independently reviewed by myself.   Differential diagnosis were considered with the presenting HPI.  Medications  sodium chloride 0.9 % bolus 1,000 mL (1,000 mLs Intravenous New Bag/Given 12/04/18 2205)    Vitals:   12/04/18 2115 12/04/18 2130 12/04/18 2145 12/04/18 2215  BP: 110/71 112/70 102/65 123/70  Pulse: (!) 54 (!) 56 (!) 54 (!) 52  Resp:    18  Temp:      TempSrc:      SpO2: 98% 97% 96% 98%    Final diagnoses:  AKI (acute kidney injury) ([place])    Admission/ observation were discussed with the admitting physician, patient and/or family and they are comfortable with the plan.    Final Clinical Impressions(s) / ED Diagnoses   Final diagnoses:  AKI (acute kidney injury) [facility])    ED Discharge Orders    None       Deno Etienne, DO 12/04/18 2324

## 2018-12-05 DIAGNOSIS — Z905 Acquired absence of kidney: Secondary | ICD-10-CM

## 2018-12-05 DIAGNOSIS — N179 Acute kidney failure, unspecified: Principal | ICD-10-CM

## 2018-12-05 DIAGNOSIS — K529 Noninfective gastroenteritis and colitis, unspecified: Secondary | ICD-10-CM | POA: Diagnosis not present

## 2018-12-05 LAB — CBC
HCT: 41.2 % (ref 39.0–52.0)
Hemoglobin: 13.4 g/dL (ref 13.0–17.0)
MCH: 30.7 pg (ref 26.0–34.0)
MCHC: 32.5 g/dL (ref 30.0–36.0)
MCV: 94.3 fL (ref 80.0–100.0)
Platelets: 204 10*3/uL (ref 150–400)
RBC: 4.37 MIL/uL (ref 4.22–5.81)
RDW: 14 % (ref 11.5–15.5)
WBC: 7.6 10*3/uL (ref 4.0–10.5)
nRBC: 0 % (ref 0.0–0.2)

## 2018-12-05 LAB — BASIC METABOLIC PANEL
Anion gap: 10 (ref 5–15)
BUN: 60 mg/dL — ABNORMAL HIGH (ref 8–23)
CALCIUM: 7.2 mg/dL — AB (ref 8.9–10.3)
CO2: 21 mmol/L — ABNORMAL LOW (ref 22–32)
Chloride: 109 mmol/L (ref 98–111)
Creatinine, Ser: 6.03 mg/dL — ABNORMAL HIGH (ref 0.61–1.24)
GFR calc Af Amer: 10 mL/min — ABNORMAL LOW (ref >60)
GFR calc non Af Amer: 9 mL/min — ABNORMAL LOW (ref >60)
Glucose, Bld: 129 mg/dL — ABNORMAL HIGH (ref 70–99)
Potassium: 3.3 mmol/L — ABNORMAL LOW (ref 3.5–5.1)
Sodium: 140 mmol/L (ref 135–145)

## 2018-12-05 MED ORDER — SODIUM CHLORIDE 0.9 % IV SOLN
INTRAVENOUS | Status: DC
  Administered 2018-12-05 – 2018-12-08 (×7): via INTRAVENOUS

## 2018-12-05 MED ORDER — ALBUTEROL SULFATE (2.5 MG/3ML) 0.083% IN NEBU: 2.5000 mg | INHALATION_SOLUTION | Freq: Four times a day (QID) | RESPIRATORY_TRACT | Status: DC | PRN

## 2018-12-05 MED ORDER — ASPIRIN EC 81 MG PO TBEC
81.0000 mg | DELAYED_RELEASE_TABLET | Freq: Every day | ORAL | Status: DC
  Administered 2018-12-05 – 2018-12-08 (×4): 81 mg via ORAL
  Filled 2018-12-05 (×4): qty 1

## 2018-12-05 MED ORDER — UMECLIDINIUM BROMIDE 62.5 MCG/INH IN AEPB
1.0000 | INHALATION_SPRAY | Freq: Two times a day (BID) | RESPIRATORY_TRACT | Status: DC
  Administered 2018-12-05 – 2018-12-08 (×4): 1 via RESPIRATORY_TRACT
  Filled 2018-12-05: qty 7

## 2018-12-05 MED ORDER — POTASSIUM CHLORIDE CRYS ER 20 MEQ PO TBCR
40.0000 meq | EXTENDED_RELEASE_TABLET | Freq: Once | ORAL | Status: AC
  Administered 2018-12-05: 40 meq via ORAL

## 2018-12-05 MED ORDER — FLUTICASONE FUROATE-VILANTEROL 200-25 MCG/INH IN AEPB
1.0000 | INHALATION_SPRAY | Freq: Every day | RESPIRATORY_TRACT | Status: DC
  Administered 2018-12-05 – 2018-12-08 (×4): 1 via RESPIRATORY_TRACT
  Filled 2018-12-05: qty 28

## 2018-12-05 MED ORDER — ALBUTEROL SULFATE HFA 108 (90 BASE) MCG/ACT IN AERS: 2.0000 | INHALATION_SPRAY | Freq: Four times a day (QID) | RESPIRATORY_TRACT | Status: DC | PRN

## 2018-12-05 MED ORDER — LEVOTHYROXINE SODIUM 25 MCG PO TABS
125.0000 ug | ORAL_TABLET | Freq: Every day | ORAL | Status: DC
  Administered 2018-12-06 – 2018-12-08 (×3): 125 ug via ORAL
  Filled 2018-12-05 (×3): qty 1

## 2018-12-05 MED ORDER — SODIUM CHLORIDE 0.9 % IV BOLUS
1000.0000 mL | Freq: Once | INTRAVENOUS | Status: AC
  Administered 2018-12-05: 1000 mL via INTRAVENOUS

## 2018-12-05 MED ORDER — ROSUVASTATIN CALCIUM 20 MG PO TABS
40.0000 mg | ORAL_TABLET | Freq: Every day | ORAL | Status: DC
  Administered 2018-12-05 – 2018-12-07 (×3): 40 mg via ORAL
  Filled 2018-12-05 (×3): qty 2

## 2018-12-05 NOTE — Plan of Care (Signed)
  Problem: Activity: Goal: Risk for activity intolerance will decrease Outcome: Progressing   Problem: Coping: Goal: Level of anxiety will decrease Outcome: Progressing   Problem: Elimination: Goal: Will not experience complications related to bowel motility Outcome: Progressing   Problem: Safety: Goal: Ability to remain free from injury will improve Outcome: Progressing   

## 2018-12-05 NOTE — Progress Notes (Signed)
Pt refused respiratory panel swab. On call NP Tylene Fantasia, made aware. Will continue to monitor.

## 2018-12-05 NOTE — H&P (Signed)
History and Physical    Brian Graham ZOX:[id number] DOB: 1951-01-16 DOA: 12/04/2018  PCP: [name], MD  Patient coming from:  home  Chief Complaint:  Abnormal labs  HPI: Brian Graham is a 68 y.o. 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  Pt last week had 4 days of explosive diarrhea over 20 x a day with associated wretching, not really vomiting but had essentially no po intake.  He reports he slept and had diarrhea for 4 days.  This has all resolved and he has been feeling better for about 2 days but has not really increased his fluids once he was able to hold down fluids.  He continued to take his meds during the illness.  He was his pcp last week cr was a little bumped to 1.5 on 1/7 and had follow up with cr 7.1 yesterday.  PCP called him and instructed him to come to ED for further work up.  Pt feels fine and has no complaints.  Pt referred for admission for aki.  Review of Systems: As per HPI otherwise 10 point review of systems negative.   Past Medical History:  Diagnosis Date  . Anxiety   . Arthritis    "left knee" (08/05/2016)  . Bruises easily   . CAD (coronary artery disease) of artery bypass graft 08/05/2016   Around age 41. CABG - 3 vessel.   . Chronic kidney disease   . Colon polyp   . COPD, severe ([place]) 08/23/2016   Alpha 1 studies normal per prior pulmonary group notes Smoked 40 years, quit in 2012 June 2016 PFT from prior pulmonary group: "significant obstruction" FEV1 1.58L (47% pred), Residual volume 171% pred, DLCO 59% pred Simple Spirometry>> 09/15/2016 ratio 42% FEV1 1.02 L / 31%   . Essential tremor 06/11/2016   Plans to see Dr. Carles Collet  . Former smoker 09/01/2016   Quit 2012. 40 pack years at least.   . GERD (gastroesophageal reflux disease)   . Headache   . History of blood transfusion 08/1997   "when he had his heart surgery"    . History of shingles 1970-2013 X 3  . HTN (hypertension) 08/05/2016   Amlodipine 5mg , atenolol 50mg  BID, benazepril 20mg   . Hyperlipemia   . Hypothyroidism   . Lumbar disc disease   . Myocardial infarction ([place]) 08/1997  . Peripheral vascular disease ([place])   . PONV (postoperative nausea and vomiting)   . Urinary tract infection 09/03/2017  . Wears glasses     Past Surgical History:  Procedure Laterality Date  . AORTOGRAM Right 08/31/2017   Procedure: AORTOGRAM BILATERAL PELVIC ANGIOGRAM WITH LEFT ILIAC ARTERY STENT;  Surgeon: Conrad Sanborn, MD;  Location: [facility];  Service: Vascular;  Laterality: Right;  . BACK SURGERY    . CARDIAC CATHETERIZATION  08/1997  . CORONARY ARTERY BYPASS GRAFT  09/12/1997   "triple"  . INGUINAL HERNIA REPAIR Right 1961  . KIDNEY REMOVED    . KNEE RECONSTRUCTION Left 1960s - 1974 X 4  . LAPAROSCOPIC ABLATION RENAL MASS  ~ 2014   "large abscess/pus ball"  . [place]   "they were wedged in the bowel"  . PATELLA FRACTURE SURGERY Left 1963  . PATELLA FRACTURE SURGERY Left ~ 1965   "removed knee cap"  . ROBOT ASSISTED LAPAROSCOPIC NEPHRECTOMY Left 10/07/2017   Procedure: XI ROBOTIC ASSISTED LAPAROSCOPIC NEPHRECTOMY OF PELVIC  KIDNEY;  Surgeon: Alexis Frock, MD;  Location: [facility];  Service: Urology;  Laterality: Left;  Place robot patient tower at foot of bed like prostate per Dr. Tresa Moore. Pull extra staple loads.  . TONSILLECTOMY       reports that he quit smoking about 16 months ago. His smoking use included cigarettes. He has a 45.00 pack-year smoking history. He has never used smokeless tobacco. He reports current alcohol use of about 14.0 standard drinks of alcohol per week. He reports that he does not use drugs.  Allergies  Allergen Reactions  . Other Itching and Rash    Gel used for ultrasound SEVERELY broke out the skin   . Keflex [Cephalexin] Diarrhea, Nausea Only and Other (See Comments)    Insomnia  . Sulfa Antibiotics  Other (See Comments)    Insomnia  . Ciprofloxacin Diarrhea  . Levaquin [Levofloxacin] Other (See Comments)    Insomnia   . Omeprazole Diarrhea    Family History  Problem Relation Age of Onset  . Alcohol abuse Mother   . Alcohol abuse Father        not involved in life  . Alcoholism Sister   . Healthy Sister   . Healthy Sister   . Allergic rhinitis Neg Hx   . Angioedema Neg Hx   . Asthma Neg Hx   . Eczema Neg Hx   . Immunodeficiency Neg Hx   . Urticaria Neg Hx     Prior to Admission medications   Medication Sig Start Date End Date Taking? Authorizing Provider  acetaminophen (TYLENOL) 500 MG tablet Take 500-1,000 mg by mouth every 6 (six) hours as needed (for pain).    Yes [provider]  albuterol (PROVENTIL HFA;VENTOLIN HFA) 108 (90 Base) MCG/ACT inhaler Inhale 2 puffs into the lungs every 6 (six) hours as needed for wheezing or shortness of breath. 04/03/18  Yes Juanito Doom, MD  albuterol (PROVENTIL) (2.5 MG/3ML) 0.083% nebulizer solution Take 3 mLs (2.5 mg total) by nebulization every 6 (six) hours as needed for wheezing or shortness of breath. DX: J44.9 04/19/18  Yes Juanito Doom, MD  alendronate (FOSAMAX) 70 MG tablet Take 1 tablet (70 mg total) by mouth every 7 (seven) days. Take with a full glass of water on an empty stomach. Patient taking differently: Take 70 mg by mouth every Sunday. Take with a full glass of water on an empty stomach. 07/18/18  Yes [name], MD  amLODipine (NORVASC) 5 MG tablet TAKE 1 TABLET DAILY Patient taking differently: Take 5 mg by mouth daily.  11/17/18  Yes [name], MD  aspirin EC 81 MG tablet Take 81 mg by mouth daily.   Yes [provider]  atenolol (TENORMIN) 50 MG tablet TAKE 1 TABLET DAILY Patient taking differently: Take 50 mg by mouth daily.  07/27/18  Yes [name], MD  benazepril (LOTENSIN) 20 MG tablet TAKE 1 TABLET DAILY Patient taking differently: Take 20 mg by mouth daily.   08/30/18  Yes [name], MD  budesonide-formoterol (SYMBICORT) 160-4.5 MCG/ACT inhaler USE 2 INHALATIONS TWICE A DAY Patient taking differently: Inhale 2 puffs into the lungs 2 (two) times daily.  09/25/18  Yes Juanito Doom, MD  Misc. Devices (PULSE OXIMETER) MISC 1 application by Does not apply route as needed. 09/21/17  Yes Vivi Barrack, MD  ondansetron (ZOFRAN-ODT) 4 MG disintegrating tablet PLACE 1 TO 2 TABLETS ON TONGUE AND ALLOW TO DISSOLVE EVERY 8 HOURS AS NEEDED FOR  NAUSEA / VOMITING Patient taking differently: Take 4-8 mg by mouth See admin instructions. PLACE 1 TO 2 TABLETS ON TONGUE AND ALLOW TO DISSOLVE EVERY 8 HOURS AS NEEDED FOR NAUSEA / VOMITING 01/17/18  Yes [name], MD  rosuvastatin (CRESTOR) 40 MG tablet Take 1 tablet (40 mg total) by mouth daily. Patient taking differently: Take 40 mg by mouth at bedtime.  06/15/18  Yes [name], MD  SPIRIVA RESPIMAT 2.5 MCG/ACT AERS USE 2 INHALATIONS DAILY Patient taking differently: Inhale 1 puff into the lungs 2 (two) times daily.  06/05/18  Yes Juanito Doom, MD  SYNTHROID 125 MCG tablet Take 1 tablet (125 mcg total) by mouth daily before breakfast. 02/20/18  Yes [name], MD  traMADol (ULTRAM) 50 MG tablet Take 1 tablet (50 mg total) by mouth every 6 (six) hours as needed for moderate pain or severe pain. 11/07/18  Yes [name], MD    Physical Exam: Vitals:   12/04/18 2115 12/04/18 2130 12/04/18 2145 12/04/18 2215  BP: 110/71 112/70 102/65 123/70  Pulse: (!) 54 (!) 56 (!) 54 (!) 52  Resp:    18  Temp:      TempSrc:      SpO2: 98% 97% 96% 98%      Constitutional: NAD, calm, comfortable Vitals:   12/04/18 2115 12/04/18 2130 12/04/18 2145 12/04/18 2215  BP: 110/71 112/70 102/65 123/70  Pulse: (!) 54 (!) 56 (!) 54 (!) 52  Resp:    18  Temp:      TempSrc:      SpO2: 98% 97% 96% 98%   Eyes: PERRL, lids and conjunctivae normal ENMT: Mucous membranes are moist. Posterior pharynx  clear of any exudate or lesions.Normal dentition.  Neck: normal, supple, no masses, no thyromegaly Respiratory: clear to auscultation bilaterally, no wheezing, no crackles. Normal respiratory effort. No accessory muscle use.  Cardiovascular: Regular rate and rhythm, no murmurs / rubs / gallops. No extremity edema. 2+ pedal pulses. No carotid bruits.  Abdomen: no tenderness, no masses palpated. No hepatosplenomegaly. Bowel sounds positive.  Musculoskeletal: no clubbing / cyanosis. No joint deformity upper and lower extremities. Good ROM, no contractures. Normal muscle tone.  Skin: no rashes, lesions, ulcers. No induration Neurologic: CN 2-12 grossly intact. Sensation intact, DTR normal. Strength 5/5 in all 4.  Psychiatric: Normal judgment and insight. Alert and oriented x 3. Normal mood.    Labs on Admission: I have personally reviewed following labs and imaging studies  CBC: Recent Labs  Lab 11/28/18 1212 12/04/18 1801  WBC 8.8 8.8  NEUTROABS 6.1  --   HGB 14.7 13.9  HCT 44.7 43.5  MCV 95.0 93.8  PLT 232.0 093   Basic Metabolic Panel: Recent Labs  Lab 11/28/18 1212 12/04/18 1104 12/04/18 1801  NA 139 139 136  K 4.7 3.8 3.4*  CL 100 98 99  CO2 33* 27 25  GLUCOSE 97 148* 109*  BUN 20 67* 68*  CREATININE 1.59* 7.12* 7.22*  CALCIUM 9.9 8.6 8.4*   GFR: Estimated Creatinine Clearance: 9.7 mL/min (A) (by C-G formula based on SCr of 7.22 mg/dL (H)). Liver Function Tests: Recent Labs  Lab 11/28/18 1212  AST 15  ALT 16  ALKPHOS 44  BILITOT 0.8  PROT 7.2  ALBUMIN 4.3   No results for input(s): LIPASE, AMYLASE in the last 168 hours. No results for input(s): AMMONIA in the last 168 hours. Coagulation Profile: No results for input(s): INR, PROTIME in the last 168 hours.  Cardiac Enzymes: No results for input(s): CKTOTAL, CKMB, CKMBINDEX, TROPONINI in the last 168 hours. BNP (last 3 results) No results for input(s): PROBNP in the last 8760 hours. HbA1C: No results for  input(s): HGBA1C in the last 72 hours. CBG: No results for input(s): GLUCAP in the last 168 hours. Lipid Profile: No results for input(s): CHOL, HDL, LDLCALC, TRIG, CHOLHDL, LDLDIRECT in the last 72 hours. Thyroid Function Tests: No results for input(s): TSH, T4TOTAL, FREET4, T3FREE, THYROIDAB in the last 72 hours. Anemia Panel: No results for input(s): VITAMINB12, FOLATE, FERRITIN, TIBC, IRON, RETICCTPCT in the last 72 hours. Urine analysis:    Component Value Date/Time   COLORURINE YELLOW 12/04/2018 2206   APPEARANCEUR HAZY (A) 12/04/2018 2206   APPEARANCEUR Clear 08/23/2016 1141   LABSPEC 1.011 12/04/2018 2206   PHURINE 5.0 12/04/2018 2206   GLUCOSEU NEGATIVE 12/04/2018 2206   GLUCOSEU NEGATIVE 11/28/2018 1212   HGBUR SMALL (A) 12/04/2018 2206   BILIRUBINUR NEGATIVE 12/04/2018 2206   BILIRUBINUR negative 09/03/2017 1155   BILIRUBINUR Negative 08/23/2016 1141   KETONESUR NEGATIVE 12/04/2018 2206   PROTEINUR NEGATIVE 12/04/2018 2206   UROBILINOGEN 0.2 11/28/2018 1212   NITRITE NEGATIVE 12/04/2018 2206   LEUKOCYTESUR NEGATIVE 12/04/2018 2206   LEUKOCYTESUR 2+ (A) 08/23/2016 1141   Sepsis Labs: !!!!!!!!!!!!!!!!!!!!!!!!!!!!!!!!!!!!!!!!!!!! @LABRCNTIP (procalcitonin:4,lacticidven:4) ) Recent Results (from the past 240 hour(s))  Urine Culture     Status: None   Collection Time: 11/28/18 12:12 PM  Result Value Ref Range Status   MICRO NUMBER: [id number]  Final   SPECIMEN QUALITY: Adequate  Final   Sample Source URINE  Final   STATUS: FINAL  Final   ISOLATE 1:   Final    Single organism less than 10,000 CFU/mL isolated. These organisms, commonly found on external and internal genitalia, are considered colonizers. No further testing performed.     Radiological Exams on Admission: Ct Renal Stone Study  Result Date: 12/04/2018 CLINICAL DATA:  Abnormal kidney function tests. Creatinine 7.1, increased from 1.6. History of left nephrectomy 2018. EXAM: CT ABDOMEN AND PELVIS WITHOUT  CONTRAST TECHNIQUE: Multidetector CT imaging of the abdomen and pelvis was performed following the standard protocol without IV contrast. COMPARISON:  08/13/2017 FINDINGS: Lower chest: Lung bases are clear. Hepatobiliary: No focal liver abnormality is seen. No gallstones, gallbladder wall thickening, or biliary dilatation. Pancreas: Unremarkable. No pancreatic ductal dilatation or surrounding inflammatory changes. Spleen: Normal in size without focal abnormality. Adrenals/Urinary Tract: No adrenal gland nodules. 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 Right kidney is normal in size. No hydronephrosis or hydroureter. No renal or ureteral stone. Bladder is unremarkable. Stomach/Bowel: Stomach is within normal limits. Appendix appears normal. No evidence of bowel wall thickening, distention, or inflammatory changes. Vascular/Lymphatic: Aortic atherosclerosis. No enlarged abdominal or pelvic lymph nodes. Reproductive: Prostate is unremarkable. Other: No free air or free fluid in the abdomen. Small subxiphoid ventral abdominal wall hernia containing fat. Musculoskeletal: Degenerative changes in the spine. No destructive bone lesions. IMPRESSION: 1. Resection of left kidney. 2. Right kidney is unremarkable. No evidence of renal or ureteral stone or obstruction. 3. Small ventral abdominal wall hernia containing fat. Aortic Atherosclerosis (ICD10-I70.0). Electronically Signed   By: Lucienne Capers M.D.   On: 12/04/2018 22:51    Old chart reviewed Case discussed with the edp   Assessment/Plan 68 yo male with recent gi illness comes in with AKI  Principal Problem:   AKI (acute kidney injury) ([place])- give another liter of ivf bolus ns already received one in  ed.  Ns at 125cc/hour after that.  From recent gi losses with addition of ace inh (atn+prerenal) +nephrectomy.  Hold ace.  Hold all bp meds.  Making good urine.  Monitor I/os.  Repeat bmp in the am.  k good.  Active  Problems:   S/p nephrectomy-noted    Gastroenteritis, acute- resolved    Hx of CABG- noted, stable    CAD (coronary artery disease) of artery bypass graft- noted    Essential hypertension- holding bp meds at this time due to aki    MGUS (monoclonal gammopathy of unknown significance)- stable, noted    DVT prophylaxis: ambulate Code Status: full Family Communication: none Disposition Plan:  days Consults called:  none Admission status:  admit   Rhone Ozaki A MD [facility]  If 7PM-7AM, please contact night-coverage [email or web address] Password [facility]  12/05/2018, 12:32 AM

## 2018-12-05 NOTE — Progress Notes (Signed)
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  GI symptoms has currently resolved. Patient was admitted for acute renal failure.  Assessment & Plan:   GDJ:[id number] prerenal from severe volume depletion in the setting his oral intake, gastroenteritis w ACEI at home.  CT stone 1/13:unremarkable right kidney, no evidence of renal or ureteral stone or obstruction.Reviewed his urinalysis has no protein, 0-5 WBC. Microalbumin creatinine ratio is normal at 5.1.Patient reports he has been voiding very well.Creatinine downtrending as below we will continue on aggressive IV fluid hydration, monitor electrolytes potassium and replete.  Continue to hold ACE inhibitor's, NSAIDs.  Check urine electrolytes.  Monitor I/o closely.  Recent Labs  Lab 11/28/18 1212 12/04/18 1104 12/04/18 1801 12/05/18 0241  CREATININE 1.59* 7.12* 7.22* 6.03*   Hypokalemia :replEting. Watch while he is having high UOP. Recent Labs  Lab 11/28/18 1212 12/04/18 1104 12/04/18 1801 12/05/18 0241  K 4.7 3.8 3.4* 3.3*    Recent Gastroenteritis, acute : resolved PTA.  Tolerating diet well.  Encouraged PO.  S/p nephrectomy of left kidney: monitor creat  Severe COPD, on Symbicort, Spiriva at home, resume home inhalers, as needed nebs.  CAD w Hx of CABG: No chest pain.  Resume home aspirin, statin.  Essential hypertension: Blood pressure well controlled.  Continue to hold ACE inhibitor, atenolol and amlodipine.  Hypothyroidism, continue Synthroid.  DVT prophylaxis: SCD. Early ambulationg Code Status: FULL Family Communication:  none at bedside Disposition Plan: Home once creatinine improves.   Consultants: None  Procedures: none    Antimicrobials:none  Subjective: Patient resting comfortably.  Denies any nausea vomiting abdominal pain.  Reports he is tolerating diet well.  He feels "good".  Objective: Vitals:   12/04/18 2215 12/05/18 0200 12/05/18 0630 12/05/18 0833  BP: 123/70 121/69  (!) 101/57  Pulse: (!) 52 60  (!) 58  Resp: 18 18  18   Temp:    98.4 F (36.9 C)  TempSrc:    Oral  SpO2: 98% 98%  95%  Weight:   69.9 kg   Height:   5\' 9"  (1.753 m)     Intake/Output Summary (Last 24 hours) at 12/05/2018 1118 Last data filed at 12/05/2018 1046 Gross per 24 hour  Intake 2490 ml  Output 475 ml  Net 2015 ml   Filed Weights   12/05/18 0630  Weight: 69.9 kg   Weight change:   Body mass index is 22.76 kg/m.  Examination:  General exam: Appears calm and comfortable ,Not in distress,average built HEENT:PERRL,Oral mucosa appear dry , Ear/Nose normal on gross exam Respiratory system: Bilateral equal air entry, normal vesicular breath sounds, no wheezes or crackles  Cardiovascular system: S1 & S2 heard,No JVD, murmurs.No pedal edema. Gastrointestinal system: Abdomen is  soft, non tender, non distended, BS +  Nervous System:Alert and oriented. No focal neurological deficits/moving extremities. Extremities: No edema, no clubbing,distal peripheral pulses palpable. Skin: No rashes, lesions,no icterus MSK: Normal muscle bulk,tone ,power  Data Reviewed: I have personally reviewed following labs and imaging studies  CBC: Recent Labs  Lab 11/28/18 1212 12/04/18 1801 12/05/18 0241  WBC 8.8 8.8 7.6  NEUTROABS 6.1  --   --  HGB 14.7 13.9 13.4  HCT 44.7 43.5 41.2  MCV 95.0 93.8 94.3  PLT 232.0 255 867   Basic Metabolic Panel: Recent Labs  Lab 11/28/18 1212 12/04/18 1104 12/04/18 1801 12/05/18 0241  NA 139 139 136 140  K 4.7 3.8 3.4* 3.3*  CL 100 98 99 109  CO2 33* 27 25 21*  GLUCOSE 97  148* 109* 129*  BUN 20 67* 68* 60*  CREATININE 1.59* 7.12* 7.22* 6.03*  CALCIUM 9.9 8.6 8.4* 7.2*   GFR: Estimated Creatinine Clearance: 11.6 mL/min (A) (by C-G formula based on SCr of 6.03 mg/dL (H)). Liver Function Tests: Recent Labs  Lab 11/28/18 1212  AST 15  ALT 16  ALKPHOS 44  BILITOT 0.8  PROT 7.2  ALBUMIN 4.3   No results for input(s): LIPASE, AMYLASE in the last 168 hours. No results for input(s): AMMONIA in the last 168 hours. Coagulation Profile: No results for input(s): INR, PROTIME in the last 168 hours. Cardiac Enzymes: No results for input(s): CKTOTAL, CKMB, CKMBINDEX, TROPONINI in the last 168 hours. BNP (last 3 results) No results for input(s): PROBNP in the last 8760 hours. HbA1C: No results for input(s): HGBA1C in the last 72 hours. CBG: No results for input(s): GLUCAP in the last 168 hours. Lipid Profile: No results for input(s): CHOL, HDL, LDLCALC, TRIG, CHOLHDL, LDLDIRECT in the last 72 hours. Thyroid Function Tests: No results for input(s): TSH, T4TOTAL, FREET4, T3FREE, THYROIDAB in the last 72 hours. Anemia Panel: No results for input(s): VITAMINB12, FOLATE, FERRITIN, TIBC, IRON, RETICCTPCT in the last 72 hours. Sepsis Labs: No results for input(s): PROCALCITON, LATICACIDVEN in the last 168 hours.  Recent Results (from the past 240 hour(s))  Urine Culture     Status: None   Collection Time: 11/28/18 12:12 PM  Result Value Ref Range Status   MICRO NUMBER: [id number]  Final   SPECIMEN QUALITY: Adequate  Final   Sample Source URINE  Final   STATUS: FINAL  Final   ISOLATE 1:   Final    Single organism less than 10,000 CFU/mL isolated. These organisms, commonly found on external and internal genitalia, are considered colonizers. No further testing performed.      Radiology Studies: Ct Renal Stone Study  Result Date: 12/04/2018 CLINICAL DATA:  Abnormal kidney function tests. Creatinine 7.1, increased from 1.6. History of left nephrectomy 2018.  EXAM: CT ABDOMEN AND PELVIS WITHOUT CONTRAST TECHNIQUE: Multidetector CT imaging of the abdomen and pelvis was performed following the standard protocol without IV contrast. COMPARISON:  08/13/2017 FINDINGS: Lower chest: Lung bases are clear. Hepatobiliary: No focal liver abnormality is seen. No gallstones, gallbladder wall thickening, or biliary dilatation. Pancreas: Unremarkable. No pancreatic ductal dilatation or surrounding inflammatory changes. Spleen: Normal in size without focal abnormality. Adrenals/Urinary Tract: No adrenal gland nodules. 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 Right kidney is normal in size. No hydronephrosis or hydroureter. No renal or ureteral stone. Bladder is unremarkable. Stomach/Bowel: Stomach is within normal limits. Appendix appears normal. No evidence of bowel wall thickening, distention, or inflammatory changes. Vascular/Lymphatic: Aortic atherosclerosis. No enlarged abdominal or pelvic lymph nodes. Reproductive: Prostate is unremarkable. Other: No free air or free fluid in the abdomen. Small subxiphoid ventral abdominal wall hernia containing fat. Musculoskeletal: Degenerative changes in the spine. No destructive bone lesions. IMPRESSION: 1. Resection of left kidney. 2. Right kidney is unremarkable. No evidence of renal or ureteral stone or obstruction. 3. Small ventral abdominal wall hernia containing fat. Aortic Atherosclerosis (ICD10-I70.0). Electronically  Signed   By: Lucienne Capers M.D.   On: 12/04/2018 22:51     Scheduled Meds: . aspirin EC  81 mg Oral Daily  . fluticasone furoate-vilanterol  1 puff Inhalation Daily  . [START ON 12/06/2018] levothyroxine  125 mcg Oral QAC breakfast  . rosuvastatin  40 mg Oral QHS  . umeclidinium bromide  1 puff Inhalation BID   Continuous Infusions: . sodium chloride 125 mL/hr at 12/05/18 1044     LOS: 1 day   Time spent: More than 50% of that time was spent in counseling and/or  coordination of care.  Antonieta Pert, MD [facility]  12/05/2018, 11:18 AM

## 2018-12-05 NOTE — Progress Notes (Signed)
Pt not in acute distress, called and gave report to Laura,RN, transferred to 5M19. Endorsed accordingly.

## 2018-12-05 NOTE — Progress Notes (Signed)
New Admission Note:   Arrival Method: Transferred from 5N in bed.  Mental Orientation: Alert and oriented x4  Telemetry: NA Assessment: Completed Skin: Intact IV: LAC infusing  Pain: 0  Tubes: None Safety Measures: Safety Fall Prevention Plan has been discussed  Admission: completed  [facility] [place] Orientation: Patient has been orientated to the room, unit and staff.   Family:  None    Orders to be reviewed and implemented. Will continue to monitor the patient. Call light has been placed within reach and bed alarm has been activated.   Baldo Ash, RN

## 2018-12-06 LAB — MAGNESIUM: Magnesium: 1.6 mg/dL — ABNORMAL LOW (ref 1.7–2.4)

## 2018-12-06 LAB — BASIC METABOLIC PANEL
ANION GAP: 10 (ref 5–15)
BUN: 39 mg/dL — ABNORMAL HIGH (ref 8–23)
CO2: 23 mmol/L (ref 22–32)
Calcium: 6.8 mg/dL — ABNORMAL LOW (ref 8.9–10.3)
Chloride: 110 mmol/L (ref 98–111)
Creatinine, Ser: 4.21 mg/dL — ABNORMAL HIGH (ref 0.61–1.24)
GFR calc Af Amer: 16 mL/min — ABNORMAL LOW (ref >60)
GFR calc non Af Amer: 14 mL/min — ABNORMAL LOW (ref >60)
Glucose, Bld: 103 mg/dL — ABNORMAL HIGH (ref 70–99)
Potassium: 3.7 mmol/L (ref 3.5–5.1)
Sodium: 143 mmol/L (ref 135–145)

## 2018-12-06 MED ORDER — MAGNESIUM OXIDE 400 (241.3 MG) MG PO TABS
400.0000 mg | ORAL_TABLET | Freq: Two times a day (BID) | ORAL | Status: DC
  Administered 2018-12-06 – 2018-12-08 (×5): 400 mg via ORAL
  Filled 2018-12-06 (×5): qty 1

## 2018-12-06 NOTE — Telephone Encounter (Signed)
Returned patient call and clarified that he needed to be seen at the hospital and not outpatient by his kidney dr. Patient verbalized understanding. He did go to the hospital and was admitted

## 2018-12-06 NOTE — Progress Notes (Signed)
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  GI symptoms has currently resolved. Patient was admitted for acute renal failure.  Assessment & Plan:   AKI non oliguric:Suspecting prerenal from severe volume depletion in the setting poor oral intake, gastroenteritis w ACEI use at home.  CT stone on 1/13:unremarkable right kidney, no evidence of renal or ureteral stone or obstruction.Reviewed his urinalysis has no protein, 0-5 WBC. Microalbumin creatinine ratio is normal at 5.1. UOP 2.6 L/24 hr, renal function is improving as below.  Continue on aggressive IV fluid hydration at NS at 125 ml/hr, monitor electrolytes potassium and mag.  Continue to hold ACE inhibitor's, NSAIDs.  I did review case with nephrology this morning who recommends to continue current treatment.  Recent Labs  Lab 12/04/18 1104 12/04/18 1801 12/05/18 0241 12/06/18 0540  CREATININE 7.12* 7.22* 6.03* 4.21*   Hypokalemia : Improved.   Hypomagnesemia- added oral magnesium oxide.  Recent Gastroenteritis, acute : resolved PTA.  Tolerating diet well.  Encouraged PO.  S/p nephrectomy of left kidney: monitor creat  Severe COPD, on Symbicort, Spiriva at home, resume home inhalers, as needed nebs.  CAD w Hx of CABG: No chest pain.  Resume home aspirin, statin.  Essential hypertension: Blood pressure well controlled.  Continue to hold ACE inhibitor, atenolol and amlodipine.  Hypothyroidism, continue Synthroid.  DVT prophylaxis: SCD. Early ambulationg Code Status: FULL Family Communication: none at bedside Disposition Plan: Pending  clinical improvement.     Consultants: None  Procedures: none   Antimicrobials:none  Subjective: seen/ examined this morning. Feels "great" No fever overnight had low-grade temp yesterday, patient refused resp swab test.  He denies any myalgia cough urinary symptoms.  Objective: Vitals:   12/06/18 0452 12/06/18 0452 12/06/18 0856 12/06/18 1103  BP: 115/74 115/74  (!) 141/73  Pulse: (!) 58 (!) 59  (!) 55  Resp: 18 18  16   Temp: 98.1 F (36.7 C) 98.1 F (36.7 C)  98.9 F (37.2 C)  TempSrc: Oral Oral  Oral  SpO2: 98% 99% 96% 97%  Weight:      Height:        Intake/Output Summary (Last 24 hours) at 12/06/2018 1342 Last data filed at 12/06/2018 1037 Gross per 24 hour  Intake 3479.38 ml  Output 2680 ml  Net 799.38 ml   Filed Weights   12/05/18 0630 12/05/18 2105  Weight: 69.9 kg 69.8 kg   Weight change: -0.1 kg  Body mass index is 22.72 kg/m.  Examination:  General exam: Calm, comfortable, not in acute distress,average built.  HEENT:Oral mucosa moist, Ear/Nose WNL grossly, dentition normal. Respiratory system: Bilateral equal air entry, no crackles and wheezing, no use of accessory muscle. Cardiovascular system: regular rate and rhythm, S1 & S2 heard, No JVD/murmurs.No pedal edema. Gastrointestinal system: Abdomen soft, nontender non-distended, BS +. No hepatosplenomegaly palpable. Nervous System:Alert, awake and oriented at baseline.Able to move UE and LE, sensation intact. Extremities: No edema, distal peripheral pulses palpable.  Skin: No rashes,no icterus. MSK: Normal muscle bulk,tone ,power  Data Reviewed: I have personally reviewed following labs and imaging studies  CBC: Recent Labs  Lab 12/04/18  1801 12/05/18 0241  WBC 8.8 7.6  HGB 13.9 13.4  HCT 43.5 41.2  MCV 93.8 94.3  PLT 255 500   Basic Metabolic Panel: Recent Labs  Lab 12/04/18 1104 12/04/18 1801 12/05/18 0241 12/06/18 0540  NA 139 136 140 143  K 3.8 3.4* 3.3* 3.7  CL 98 99 109 110    CO2 27 25 21* 23  GLUCOSE 148* 109* 129* 103*  BUN 67* 68* 60* 39*  CREATININE 7.12* 7.22* 6.03* 4.21*  CALCIUM 8.6 8.4* 7.2* 6.8*  MG  --   --   --  1.6*   GFR: Estimated Creatinine Clearance: 16.6 mL/min (A) (by C-G formula based on SCr of 4.21 mg/dL (H)). Liver Function Tests: No results for input(s): AST, ALT, ALKPHOS, BILITOT, PROT, ALBUMIN in the last 168 hours. No results for input(s): LIPASE, AMYLASE in the last 168 hours. No results for input(s): AMMONIA in the last 168 hours. Coagulation Profile: No results for input(s): INR, PROTIME in the last 168 hours. Cardiac Enzymes: No results for input(s): CKTOTAL, CKMB, CKMBINDEX, TROPONINI in the last 168 hours. BNP (last 3 results) No results for input(s): PROBNP in the last 8760 hours. HbA1C: No results for input(s): HGBA1C in the last 72 hours. CBG: No results for input(s): GLUCAP in the last 168 hours. Lipid Profile: No results for input(s): CHOL, HDL, LDLCALC, TRIG, CHOLHDL, LDLDIRECT in the last 72 hours. Thyroid Function Tests: No results for input(s): TSH, T4TOTAL, FREET4, T3FREE, THYROIDAB in the last 72 hours. Anemia Panel: No results for input(s): VITAMINB12, FOLATE, FERRITIN, TIBC, IRON, RETICCTPCT in the last 72 hours. Sepsis Labs: No results for input(s): PROCALCITON, LATICACIDVEN in the last 168 hours.  Recent Results (from the past 240 hour(s))  Urine Culture     Status: None   Collection Time: 11/28/18 12:12 PM  Result Value Ref Range Status   MICRO NUMBER: [id number]  Final   SPECIMEN QUALITY: Adequate  Final   Sample Source URINE  Final   STATUS: FINAL  Final   ISOLATE 1:   Final    Single organism less than 10,000 CFU/mL isolated. These organisms, commonly found on external and internal genitalia, are considered colonizers. No further testing performed.      Radiology Studies: Ct Renal Stone Study  Result Date: 12/04/2018 CLINICAL DATA:  Abnormal kidney function tests. Creatinine 7.1, increased  from 1.6. History of left nephrectomy 2018. EXAM: CT ABDOMEN AND PELVIS WITHOUT CONTRAST TECHNIQUE: Multidetector CT imaging of the abdomen and pelvis was performed following the standard protocol without IV contrast. COMPARISON:  08/13/2017 FINDINGS: Lower chest: Lung bases are clear. Hepatobiliary: No focal liver abnormality is seen. No gallstones, gallbladder wall thickening, or biliary dilatation. Pancreas: Unremarkable. No pancreatic ductal dilatation or surrounding inflammatory changes. Spleen: Normal in size without focal abnormality. Adrenals/Urinary Tract: No adrenal gland nodules. 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 Right kidney is normal in size. No hydronephrosis or hydroureter. No renal or ureteral stone. Bladder is unremarkable. Stomach/Bowel: Stomach is within normal limits. Appendix appears normal. No evidence of bowel wall thickening, distention, or inflammatory changes. Vascular/Lymphatic: Aortic atherosclerosis. No enlarged abdominal or pelvic lymph nodes. Reproductive: Prostate is unremarkable. Other: No free air or free fluid in the abdomen. Small subxiphoid ventral abdominal wall hernia containing fat. Musculoskeletal: Degenerative changes in the spine. No destructive bone lesions. IMPRESSION: 1. Resection of left kidney. 2. Right kidney is unremarkable. No evidence of renal or ureteral stone or obstruction. 3. Small ventral abdominal wall hernia  containing fat. Aortic Atherosclerosis (ICD10-I70.0). Electronically Signed   By: Lucienne Capers M.D.   On: 12/04/2018 22:51     Scheduled Meds: . aspirin EC  81 mg Oral Daily  . fluticasone furoate-vilanterol  1 puff Inhalation Daily  . levothyroxine  125 mcg Oral QAC breakfast  . magnesium oxide  400 mg Oral BID  . rosuvastatin  40 mg Oral QHS  . umeclidinium bromide  1 puff Inhalation BID   Continuous Infusions: . sodium chloride 125 mL/hr at 12/06/18 0216     LOS: 2 days   Time spent: More  than 50% of that time was spent in counseling and/or coordination of care.  Antonieta Pert, MD [facility]  12/06/2018, 1:42 PM

## 2018-12-07 LAB — BASIC METABOLIC PANEL
Anion gap: 8 (ref 5–15)
BUN: 22 mg/dL (ref 8–23)
CO2: 25 mmol/L (ref 22–32)
Calcium: 6.7 mg/dL — ABNORMAL LOW (ref 8.9–10.3)
Chloride: 112 mmol/L — ABNORMAL HIGH (ref 98–111)
Creatinine, Ser: 2.96 mg/dL — ABNORMAL HIGH (ref 0.61–1.24)
GFR calc Af Amer: 24 mL/min — ABNORMAL LOW (ref >60)
GFR calc non Af Amer: 21 mL/min — ABNORMAL LOW (ref >60)
Glucose, Bld: 90 mg/dL (ref 70–99)
Potassium: 3.7 mmol/L (ref 3.5–5.1)
Sodium: 145 mmol/L (ref 135–145)

## 2018-12-07 MED ORDER — ZOLPIDEM TARTRATE 5 MG PO TABS
5.0000 mg | ORAL_TABLET | Freq: Every evening | ORAL | Status: DC | PRN
  Administered 2018-12-07: 5 mg via ORAL
  Filled 2018-12-07: qty 1

## 2018-12-07 NOTE — Care Management Important Message (Signed)
Important Message  Patient Details  Name: Brian Graham MRN: [id number] Date of Birth: 1951/05/07   Medicare Important Message Given:  Yes    [name] 12/07/2018, 11:46 AM

## 2018-12-07 NOTE — Progress Notes (Signed)
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  GI symptoms has currently resolved. Patient was admitted for acute renal failure.  Assessment & Plan:   AKI non oliguric:Suspecting prerenal from severe volume depletion in the setting poor oral intake, gastroenteritis w ACEI use at home.  CT stone on 1/13:unremarkable right kidney, no evidence of renal or ureteral stone or obstruction.Reviewed his urinalysis has no protein, 0-5 WBC. Microalbumin creatinine ratio is normal at 5.1.  Having adequate urine output,and renal function is improving with downtrending creatinine.  Cut down NS to 50 mL/h, continue oral hydration.monitor electrolytes.Continue to hold ACE inhibitor's, NSAIDs.  I discussed with Dr. Jimmy Footman again today, and agrees with current plan.  Patient is requesting to be discharged home today.After my counseling he was agreeable to stay only 1 more night.  Recent Labs  Lab 12/04/18 1104 12/04/18 1801 12/05/18 0241 12/06/18 0540 12/07/18 0651  CREATININE 7.12* 7.22* 6.03* 4.21* 2.96*   Hypokalemia : Improved.   Hypomagnesemia- added oral magnesium oxide.  Recent Gastroenteritis, acute : resolved PTA.  Tolerating diet well.  Encouraged PO.  S/p nephrectomy of left kidney: monitor creat  Severe COPD, on Symbicort, Spiriva at home, resume home inhalers, as needed nebs.  CAD w Hx of CABG: No chest pain.  Resume home aspirin, statin.  Essential hypertension: Blood pressure well controlled.  Continue to hold ACE inhibitor, atenolol and amlodipine.   Hypothyroidism, continue Synthroid.  DVT  prophylaxis: SCD. Early ambulationg Code Status: FULL Family Communication: none at bedside Disposition Plan: Pending clinical improvement. he is requesting to be discharged today, he is agreeable to stay 1 more night.   Consultants: None  Procedures: none   Antimicrobials:none  Subjective: Seen and examined. "I am ready to go home today" " I can drink and Pee myself at home" Denies any new complaints. Having adequate urine output.  Objective: Vitals:   12/06/18 2054 12/07/18 0453 12/07/18 0911 12/07/18 0929  BP: 137/66 (!) 137/59  136/70  Pulse: (!) 55 (!) 52  (!) 52  Resp: 18 18  18   Temp: 98.7 F (37.1 C) 98.2 F (36.8 C)  98 F (36.7 C)  TempSrc: Oral Oral  Oral  SpO2: 98% 97% 94% 97%  Weight:      Height:        Intake/Output Summary (Last 24 hours) at 12/07/2018 1243 Last data filed at 12/07/2018 0830 Gross per 24 hour  Intake 4581.1 ml  Output 3500 ml  Net 1081.1 ml   Filed Weights   12/05/18 0630 12/05/18 2105  Weight: 69.9 kg 69.8 kg   Weight change:   Body mass index is 22.72 kg/m.  Examination:  General exam: agitated and anxious, not in acute distress,average built.  HEENT:Oral mucosa moist, Ear/Nose WNL grossly, dentition normal. Respiratory system: Bilateral equal air entry, no crackles and wheezing, no use of accessory muscle. Cardiovascular system: regular rate and rhythm, S1 & S2 heard, No JVD/murmurs.No pedal edema. Gastrointestinal system: Abdomen soft, nontender non-distended, BS +. No hepatosplenomegaly palpable. Nervous System:Alert, awake and oriented at baseline.Able to move UE and LE, sensation intact. Extremities: No edema, distal peripheral pulses  palpable.  Skin: No rashes,no icterus. MSK: Normal muscle bulk,tone ,power  Data Reviewed: I have personally reviewed following labs and imaging studies  CBC: Recent Labs  Lab 12/04/18 1801 12/05/18 0241  WBC 8.8 7.6  HGB 13.9 13.4  HCT 43.5 41.2  MCV 93.8 94.3  PLT 255 633    Basic Metabolic Panel: Recent Labs  Lab 12/04/18 1104 12/04/18 1801 12/05/18 0241 12/06/18 0540 12/07/18 0651  NA 139 136 140 143 145  K 3.8 3.4* 3.3* 3.7 3.7  CL 98 99 109 110 112*  CO2 27 25 21* 23 25  GLUCOSE 148* 109* 129* 103* 90  BUN 67* 68* 60* 39* 22  CREATININE 7.12* 7.22* 6.03* 4.21* 2.96*  CALCIUM 8.6 8.4* 7.2* 6.8* 6.7*  MG  --   --   --  1.6*  --    GFR: Estimated Creatinine Clearance: 23.6 mL/min (A) (by C-G formula based on SCr of 2.96 mg/dL (H)). Liver Function Tests: No results for input(s): AST, ALT, ALKPHOS, BILITOT, PROT, ALBUMIN in the last 168 hours. No results for input(s): LIPASE, AMYLASE in the last 168 hours. No results for input(s): AMMONIA in the last 168 hours. Coagulation Profile: No results for input(s): INR, PROTIME in the last 168 hours. Cardiac Enzymes: No results for input(s): CKTOTAL, CKMB, CKMBINDEX, TROPONINI in the last 168 hours. BNP (last 3 results) No results for input(s): PROBNP in the last 8760 hours. HbA1C: No results for input(s): HGBA1C in the last 72 hours. CBG: No results for input(s): GLUCAP in the last 168 hours. Lipid Profile: No results for input(s): CHOL, HDL, LDLCALC, TRIG, CHOLHDL, LDLDIRECT in the last 72 hours. Thyroid Function Tests: No results for input(s): TSH, T4TOTAL, FREET4, T3FREE, THYROIDAB in the last 72 hours. Anemia Panel: No results for input(s): VITAMINB12, FOLATE, FERRITIN, TIBC, IRON, RETICCTPCT in the last 72 hours. Sepsis Labs: No results for input(s): PROCALCITON, LATICACIDVEN in the last 168 hours.  Recent Results (from the past 240 hour(s))  Urine Culture     Status: None   Collection Time: 11/28/18 12:12 PM  Result Value Ref Range Status   MICRO NUMBER: [id number]  Final   SPECIMEN QUALITY: Adequate  Final   Sample Source URINE  Final   STATUS: FINAL  Final   ISOLATE 1:   Final    Single organism less than 10,000 CFU/mL isolated. These organisms, commonly found on external and internal  genitalia, are considered colonizers. No further testing performed.      Radiology Studies: No results found.   Scheduled Meds: . aspirin EC  81 mg Oral Daily  . fluticasone furoate-vilanterol  1 puff Inhalation Daily  . levothyroxine  125 mcg Oral QAC breakfast  . magnesium oxide  400 mg Oral BID  . rosuvastatin  40 mg Oral QHS  . umeclidinium bromide  1 puff Inhalation BID   Continuous Infusions: . sodium chloride 125 mL/hr at 12/07/18 1008     LOS: 3 days   Time spent: More than 50% of that time was spent in counseling and/or coordination of care.  Antonieta Pert, MD [facility]  12/07/2018, 12:43 PM

## 2018-12-08 ENCOUNTER — Telehealth: Payer: Self-pay | Admitting: Family Medicine

## 2018-12-08 LAB — BASIC METABOLIC PANEL
Anion gap: 9 (ref 5–15)
Anion gap: 9 (ref 5–15)
BUN: 15 mg/dL (ref 8–23)
BUN: 14 mg/dL (ref 8–23)
CALCIUM: 6.7 mg/dL — AB (ref 8.9–10.3)
CO2: 25 mmol/L (ref 22–32)
CO2: 24 mmol/L (ref 22–32)
Calcium: 6.7 mg/dL — ABNORMAL LOW (ref 8.9–10.3)
Chloride: 109 mmol/L (ref 98–111)
Chloride: 110 mmol/L (ref 98–111)
Creatinine, Ser: 2.63 mg/dL — ABNORMAL HIGH (ref 0.61–1.24)
Creatinine, Ser: 2.29 mg/dL — ABNORMAL HIGH (ref 0.61–1.24)
GFR calc Af Amer: 28 mL/min — ABNORMAL LOW (ref >60)
GFR calc Af Amer: 33 mL/min — ABNORMAL LOW (ref >60)
GFR calc non Af Amer: 24 mL/min — ABNORMAL LOW (ref >60)
GFR calc non Af Amer: 28 mL/min — ABNORMAL LOW (ref >60)
Glucose, Bld: 85 mg/dL (ref 70–99)
Glucose, Bld: 90 mg/dL (ref 70–99)
Potassium: 3.7 mmol/L (ref 3.5–5.1)
Potassium: 3.9 mmol/L (ref 3.5–5.1)
SODIUM: 143 mmol/L (ref 135–145)
Sodium: 143 mmol/L (ref 135–145)

## 2018-12-08 NOTE — Discharge Summary (Signed)
Physician Discharge Summary  Brian Graham KNL:[id number] DOB: 06-16-1951 DOA: 12/04/2018  PCP: [name], MD  Admit date: 12/04/2018 Discharge date: 12/08/2018  Admitted From: Home Disposition: Home  Recommendations for Outpatient Follow-up:  1. 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  GI symptoms has currently resolved. Patient was admitted for acute renal failure. Patient was managed conservatively holding his medication, with aggressive IV fluids.  This time patient is clinically improved, creatinine is down to 2.2 patient is tolerating p.o. well, adequate urine output and adamant on going home today  Assessment & Plan:   AKI non oliguric:Suspecting prerenal from severe volume depletion in the setting poor oral intake, gastroenteritis w ACEI use at home.  CT stone on 1/13:unremarkable right kidney, no evidence of renal or ureteral stone or obstruction.Reviewed his urinalysis has no protein, 0-5 WBC. Microalbumin creatinine ratio is normal at 5.1.  Having adequate urine output,and renal function is improved to 2.2 Continue to hold ACE inhibitor's, NSAIDs.  I discussed with Dr. Jimmy Footman and agrees w plan.  Patient is adamant on going home today I have instructed him to follow-up with PCP on Monday and check his labs.  Home today.  Recent Labs  Lab 12/05/18 0241 12/06/18 0540 12/07/18 0651 12/08/18 0339 12/08/18 0939  CREATININE 6.03* 4.21* 2.96* 2.63* 2.29*    Hypokalemia : Improved.   Hypomagnesemia-  repleted.  Recent Gastroenteritis, acute : resolved PTA.    Tolerating oral well.   S/p nephrectomy of left kidney: monitor creat as o/p.  Severe COPD, on Symbicort, Spiriva at home, cont home inhalers, as needed nebs.  CAD w Hx of CABG: No chest pain.  cont home aspirin, statin.  Essential hypertension: Blood pressure well controlled. We will continue to hold ACE inhibitor, atenolol and amlodipine.  Advised to follow-up with PCP regarding his antihypertensive medications and monitor blood pressure at home  Hypothyroidism, continue Synthroid.  Again patient is adamant on leaving today.  He was very upset when I saw him this morning.  He agrees with my recommendation to follow-up with his doctor on Monday and also to avoid nephrotoxic medication and ensure adequate hydration at home.  Discharge Diagnoses:  Principal Problem:   AKI (acute kidney injury) ([place]) Active Problems:   Hx of CABG   CAD (coronary artery disease) of artery bypass graft   Essential hypertension   MGUS (monoclonal gammopathy of unknown significance)   S/p nephrectomy   Gastroenteritis, acute    Discharge Instructions  Discharge Instructions    Diet - low sodium heart healthy   Complete by:  As directed    Increase activity slowly   Complete by:  As directed    Other Restrictions   Complete by:  As directed    Please do not take your blood pressure medication especially benazepril until you discuss with your doctor. Please avoid NSAIDs like ibuprofen, Motrin. Ensure you are drinking enough water every day. Follow-up with your family doctor on Monday and check your kidney function.     Allergies as of 12/08/2018  Reactions   Other Itching, Rash   Gel used for ultrasound SEVERELY broke out the skin    Keflex [cephalexin] Diarrhea, Nausea Only, Other (See Comments)   Insomnia   Sulfa Antibiotics Other (See Comments)   Insomnia   Ciprofloxacin Diarrhea   Levaquin [levofloxacin] Other (See  Comments)   Insomnia   Omeprazole Diarrhea      Medication List    STOP taking these medications   amLODipine 5 MG tablet Commonly known as:  NORVASC   atenolol 50 MG tablet Commonly known as:  TENORMIN   benazepril 20 MG tablet Commonly known as:  LOTENSIN     TAKE these medications   acetaminophen 500 MG tablet Commonly known as:  TYLENOL Take 500-1,000 mg by mouth every 6 (six) hours as needed (for pain).   albuterol 108 (90 Base) MCG/ACT inhaler Commonly known as:  PROVENTIL HFA;VENTOLIN HFA Inhale 2 puffs into the lungs every 6 (six) hours as needed for wheezing or shortness of breath.   albuterol (2.5 MG/3ML) 0.083% nebulizer solution Commonly known as:  PROVENTIL Take 3 mLs (2.5 mg total) by nebulization every 6 (six) hours as needed for wheezing or shortness of breath. DX: J44.9   alendronate 70 MG tablet Commonly known as:  FOSAMAX Take 1 tablet (70 mg total) by mouth every 7 (seven) days. Take with a full glass of water on an empty stomach. What changed:  when to take this   aspirin EC 81 MG tablet Take 81 mg by mouth daily.   budesonide-formoterol 160-4.5 MCG/ACT inhaler Commonly known as:  SYMBICORT USE 2 INHALATIONS TWICE A DAY What changed:    how much to take  how to take this  when to take this  additional instructions   ondansetron 4 MG disintegrating tablet Commonly known as:  ZOFRAN-ODT PLACE 1 TO 2 TABLETS ON TONGUE AND ALLOW TO DISSOLVE EVERY 8 HOURS AS NEEDED FOR NAUSEA / VOMITING What changed:    how much to take  how to take this  when to take this   Pulse Oximeter Misc 1 application by Does not apply route as needed.   rosuvastatin 40 MG tablet Commonly known as:  CRESTOR Take 1 tablet (40 mg total) by mouth daily. What changed:  when to take this   SPIRIVA RESPIMAT 2.5 MCG/ACT Aers Generic drug:  Tiotropium Bromide Monohydrate USE 2 INHALATIONS DAILY What changed:  See the new instructions.   SYNTHROID 125 MCG  tablet Generic drug:  levothyroxine Take 1 tablet (125 mcg total) by mouth daily before breakfast.   traMADol 50 MG tablet Commonly known as:  ULTRAM Take 1 tablet (50 mg total) by mouth every 6 (six) hours as needed for moderate pain or severe pain.      Follow-up Information    [name], MD Follow up in 3 day(s).   Specialty:  Family Medicine Why:  to check BMP Contact information: [place] [address] [phone]          Allergies  Allergen Reactions  . Other Itching and Rash    Gel used for ultrasound SEVERELY broke out the skin   . Keflex [Cephalexin] Diarrhea, Nausea Only and Other (See Comments)    Insomnia  . Sulfa Antibiotics Other (See Comments)    Insomnia  . Ciprofloxacin Diarrhea  . Levaquin [Levofloxacin] Other (See Comments)    Insomnia   . Omeprazole Diarrhea    Consultations:  Nephrology at curbside   Procedures/Studies: Dg Abd 1  View  Result Date: 11/28/2018 CLINICAL DATA:  Generalized abdominal pain constipation for 3 months. EXAM: ABDOMEN - 1 VIEW COMPARISON:  None. FINDINGS: No abnormal bowel dilatation is noted. Large amount of stool seen throughout the colon. No radio-opaque calculi or other significant radiographic abnormality are seen. IMPRESSION: Large stool burden.  No evidence of bowel obstruction or ileus. Electronically Signed   By: Marijo Conception, M.D.   On: 11/28/2018 16:12   US Renal  Result Date: 11/29/2018 CLINICAL DATA:  Worsening GFR with granular casts in urine EXAM: RENAL / URINARY TRACT ULTRASOUND COMPLETE COMPARISON:  None. FINDINGS: Right Kidney: Renal measurements: 11.4 x 6.6 x 5.7 cm. = volume: 224 mL . Echogenicity within normal limits. No mass or hydronephrosis visualized. Left Kidney: Surgically removed. Bladder: Appears normal for degree of bladder distention. IMPRESSION: Status post left nephrectomy. No acute abnormality noted. Electronically Signed   By: Inez Catalina M.D.   On:  11/29/2018 11:53   Ct Renal Stone Study  Result Date: 12/04/2018 CLINICAL DATA:  Abnormal kidney function tests. Creatinine 7.1, increased from 1.6. History of left nephrectomy 2018. EXAM: CT ABDOMEN AND PELVIS WITHOUT CONTRAST TECHNIQUE: Multidetector CT imaging of the abdomen and pelvis was performed following the standard protocol without IV contrast. COMPARISON:  08/13/2017 FINDINGS: Lower chest: Lung bases are clear. Hepatobiliary: No focal liver abnormality is seen. No gallstones, gallbladder wall thickening, or biliary dilatation. Pancreas: Unremarkable. No pancreatic ductal dilatation or surrounding inflammatory changes. Spleen: Normal in size without focal abnormality. Adrenals/Urinary Tract: No adrenal gland nodules. 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 Right kidney is normal in size. No hydronephrosis or hydroureter. No renal or ureteral stone. Bladder is unremarkable. Stomach/Bowel: Stomach is within normal limits. Appendix appears normal. No evidence of bowel wall thickening, distention, or inflammatory changes. Vascular/Lymphatic: Aortic atherosclerosis. No enlarged abdominal or pelvic lymph nodes. Reproductive: Prostate is unremarkable. Other: No free air or free fluid in the abdomen. Small subxiphoid ventral abdominal wall hernia containing fat. Musculoskeletal: Degenerative changes in the spine. No destructive bone lesions. IMPRESSION: 1. Resection of left kidney. 2. Right kidney is unremarkable. No evidence of renal or ureteral stone or obstruction. 3. Small ventral abdominal wall hernia containing fat. Aortic Atherosclerosis (ICD10-I70.0). Electronically Signed   By: Lucienne Capers M.D.   On: 12/04/2018 22:51   (Echo, Carotid, EGD, Colonoscopy, ERCP)    Subjective:   Discharge Exam: Vitals:   12/08/18 0840 12/08/18 0859  BP: 134/66   Pulse: (!) 50   Resp: 17   Temp: 98.5 F (36.9 C)   SpO2: 98% 98%   Vitals:   12/07/18 2034 12/08/18  0637 12/08/18 0840 12/08/18 0859  BP: (!) 145/68 (!) 121/52 134/66   Pulse: (!) 48 (!) 52 (!) 50   Resp: 20 17 17    Temp: 98.2 F (36.8 C) 98.3 F (36.8 C) 98.5 F (36.9 C)   TempSrc: Oral Oral Oral   SpO2:  97% 98% 98%  Weight: 70.1 kg     Height:        General: Pt is alert, awake, not in acute distress Cardiovascular: RRR, S1/S2 +, no rubs, no gallops Respiratory: CTA bilaterally, no wheezing, no rhonchi Abdominal: Soft, NT, ND, bowel sounds + Extremities: no edema, no cyanosis   The results of significant diagnostics from this hospitalization (including imaging, microbiology, ancillary and laboratory) are listed below for reference.     Microbiology: Recent Results (from the past 240 hour(s))  Urine Culture  Status: None   Collection Time: 11/28/18 12:12 PM  Result Value Ref Range Status   MICRO NUMBER: [id number]  Final   SPECIMEN QUALITY: Adequate  Final   Sample Source URINE  Final   STATUS: FINAL  Final   ISOLATE 1:   Final    Single organism less than 10,000 CFU/mL isolated. These organisms, commonly found on external and internal genitalia, are considered colonizers. No further testing performed.     Labs: BNP (last 3 results) No results for input(s): BNP in the last 8760 hours. Basic Metabolic Panel: Recent Labs  Lab 12/05/18 0241 12/06/18 0540 12/07/18 0651 12/08/18 0339 12/08/18 0939  NA 140 143 145 143 143  K 3.3* 3.7 3.7 3.7 3.9  CL 109 110 112* 109 110  CO2 21* 23 25 25 24   GLUCOSE 129* 103* 90 85 90  BUN 60* 39* 22 15 14   CREATININE 6.03* 4.21* 2.96* 2.63* 2.29*  CALCIUM 7.2* 6.8* 6.7* 6.7* 6.7*  MG  --  1.6*  --   --   --    Liver Function Tests: No results for input(s): AST, ALT, ALKPHOS, BILITOT, PROT, ALBUMIN in the last 168 hours. No results for input(s): LIPASE, AMYLASE in the last 168 hours. No results for input(s): AMMONIA in the last 168 hours. CBC: Recent Labs  Lab 12/04/18 1801 12/05/18 0241  WBC 8.8 7.6  HGB 13.9 13.4   HCT 43.5 41.2  MCV 93.8 94.3  PLT 255 204   Cardiac Enzymes: No results for input(s): CKTOTAL, CKMB, CKMBINDEX, TROPONINI in the last 168 hours. BNP: Invalid input(s): POCBNP CBG: No results for input(s): GLUCAP in the last 168 hours. D-Dimer No results for input(s): DDIMER in the last 72 hours. Hgb A1c No results for input(s): HGBA1C in the last 72 hours. Lipid Profile No results for input(s): CHOL, HDL, LDLCALC, TRIG, CHOLHDL, LDLDIRECT in the last 72 hours. Thyroid function studies No results for input(s): TSH, T4TOTAL, T3FREE, THYROIDAB in the last 72 hours.  Invalid input(s): FREET3 Anemia work up No results for input(s): VITAMINB12, FOLATE, FERRITIN, TIBC, IRON, RETICCTPCT in the last 72 hours. Urinalysis    Component Value Date/Time   COLORURINE YELLOW 12/04/2018 2206   APPEARANCEUR HAZY (A) 12/04/2018 2206   APPEARANCEUR Clear 08/23/2016 1141   LABSPEC 1.011 12/04/2018 2206   PHURINE 5.0 12/04/2018 2206   GLUCOSEU NEGATIVE 12/04/2018 2206   GLUCOSEU NEGATIVE 11/28/2018 1212   HGBUR SMALL (A) 12/04/2018 2206   BILIRUBINUR NEGATIVE 12/04/2018 2206   BILIRUBINUR negative 09/03/2017 1155   BILIRUBINUR Negative 08/23/2016 1141   KETONESUR NEGATIVE 12/04/2018 2206   PROTEINUR NEGATIVE 12/04/2018 2206   UROBILINOGEN 0.2 11/28/2018 1212   NITRITE NEGATIVE 12/04/2018 2206   LEUKOCYTESUR NEGATIVE 12/04/2018 2206   LEUKOCYTESUR 2+ (A) 08/23/2016 1141   Sepsis Labs Invalid input(s): PROCALCITONIN,  WBC,  LACTICIDVEN Microbiology Recent Results (from the past 240 hour(s))  Urine Culture     Status: None   Collection Time: 11/28/18 12:12 PM  Result Value Ref Range Status   MICRO NUMBER: [id number]  Final   SPECIMEN QUALITY: Adequate  Final   Sample Source URINE  Final   STATUS: FINAL  Final   ISOLATE 1:   Final    Single organism less than 10,000 CFU/mL isolated. These organisms, commonly found on external and internal genitalia, are considered colonizers. No further  testing performed.     Time coordinating discharge: 35 minutes  SIGNED:   Antonieta Pert, MD  [facility] 12/08/2018, 11:05 AM  If 7PM-7AM, please contact night-coverage [email or web address]

## 2018-12-08 NOTE — Telephone Encounter (Signed)
Should patient come in sooner or is he fine with his appt that is scheduled?  Copied from [place] [phone]. Topic: General - Other >> Dec 08, 2018 12:34 PM Carolyn Stare wrote:  Pt has a Hosp fup on 12/20/2018 and since Dr [name] was the one that sent pt to the hosp he found out he has kidney failure and is thinkg Dr [name] may want to see him sooner. Please check and let pt know

## 2018-12-08 NOTE — Progress Notes (Signed)
Patient discharged to home. Patient AVS reviewed and signed. Patient capable re-verbalizing medications and follow-up appointments. IV removed. Patient belongings sent with patient. Patient educated to return to the ED in the event of SOB, chest pain or dizziness.   Kania Regnier B. RN 

## 2018-12-08 NOTE — Telephone Encounter (Signed)
error 

## 2018-12-10 ENCOUNTER — Encounter: Payer: Self-pay | Admitting: Family Medicine

## 2018-12-12 ENCOUNTER — Ambulatory Visit: Payer: Medicare HMO | Admitting: Family Medicine

## 2018-12-12 ENCOUNTER — Encounter: Payer: Self-pay | Admitting: Family Medicine

## 2018-12-12 VITALS — BP 146/84 | HR 52 | Temp 97.7°F | Ht 69.0 in | Wt 153.4 lb

## 2018-12-12 DIAGNOSIS — E039 Hypothyroidism, unspecified: Secondary | ICD-10-CM

## 2018-12-12 DIAGNOSIS — E785 Hyperlipidemia, unspecified: Secondary | ICD-10-CM | POA: Diagnosis not present

## 2018-12-12 DIAGNOSIS — I10 Essential (primary) hypertension: Secondary | ICD-10-CM | POA: Diagnosis not present

## 2018-12-12 DIAGNOSIS — N179 Acute kidney failure, unspecified: Secondary | ICD-10-CM | POA: Diagnosis not present

## 2018-12-12 DIAGNOSIS — I25810 Atherosclerosis of coronary artery bypass graft(s) without angina pectoris: Secondary | ICD-10-CM

## 2018-12-12 DIAGNOSIS — J449 Chronic obstructive pulmonary disease, unspecified: Secondary | ICD-10-CM

## 2018-12-12 LAB — BASIC METABOLIC PANEL
BUN: 18 mg/dL (ref 6–23)
CO2: 30 mEq/L (ref 19–32)
Calcium: 8.7 mg/dL (ref 8.4–10.5)
Chloride: 103 mEq/L (ref 96–112)
Creatinine, Ser: 1.7 mg/dL — ABNORMAL HIGH (ref 0.40–1.50)
GFR: 40.23 mL/min — ABNORMAL LOW (ref >60.00)
Glucose, Bld: 81 mg/dL (ref 70–99)
Potassium: 4.8 mEq/L (ref 3.5–5.1)
SODIUM: 141 meq/L (ref 135–145)

## 2018-12-12 MED ORDER — ONDANSETRON 4 MG PO TBDP: ORAL_TABLET | ORAL | 0 refills | Status: DC

## 2018-12-12 NOTE — Progress Notes (Signed)
Subjective:  Brian Graham is a 68 y.o. year old very pleasant male patient who presents for/with See problem oriented charting ROS-patient feels well today other than some constipation.  Only minimal weight loss despite hospitalization.  No chest pain reported.  No edema.  No lower abdominal pain.  No fever.  Past Medical History-  Patient Active Problem List   Diagnosis Date Noted  . Osteoporosis 06/26/2018    Priority: High  . Atherosclerosis of native arteries of extremity with intermittent claudication ([place]) 07/06/2017    Priority: High  . Weight loss 04/23/2017    Priority: High  . Former smoker 09/01/2016    Priority: High  . COPD, severe ([place]) 08/23/2016    Priority: High  . Hx of CABG 08/05/2016    Priority: High  . CAD (coronary artery disease) of artery bypass graft 08/05/2016    Priority: High  . History of adenomatous polyp of colon 04/22/2017    Priority: Medium  . Hyperlipidemia 04/04/2017    Priority: Medium  . Hypothyroidism (acquired) 04/04/2017    Priority: Medium  . Essential hypertension 08/05/2016    Priority: Medium  . Essential tremor 06/11/2016    Priority: Medium  . Aortic atherosclerosis ([place]) 04/13/2017    Priority: Low  . Seasonal allergies 04/04/2017    Priority: Low  . Myalgia 10/04/2016    Priority: Low  . Dysuria 08/23/2016    Priority: Low  . Chronic pain of both knees 08/23/2016    Priority: Low  . Tension headache 06/11/2016    Priority: Low  . S/p nephrectomy 12/05/2018  . Gastroenteritis, acute 12/05/2018  . AKI (acute kidney injury) ([place]) 12/04/2018  . MGUS (monoclonal gammopathy of unknown significance) 08/02/2018  . Perennial allergic rhinitis 01/24/2018  . 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 Urinary tract infection 09/03/2017  . Constipation 09/03/2017  . Anxiety 07/24/2017  . Pyelonephritis 04/23/2017    Medications- reviewed and updated Current Outpatient Medications   Medication Sig Dispense Refill  . acetaminophen (TYLENOL) 500 MG tablet Take 500-1,000 mg by mouth every 6 (six) hours as needed (for pain).     Marland Kitchen albuterol (PROVENTIL HFA;VENTOLIN HFA) 108 (90 Base) MCG/ACT inhaler Inhale 2 puffs into the lungs every 6 (six) hours as needed for wheezing or shortness of breath. 1 Inhaler 5  . albuterol (PROVENTIL) (2.5 MG/3ML) 0.083% nebulizer solution Take 3 mLs (2.5 mg total) by nebulization every 6 (six) hours as needed for wheezing or shortness of breath. DX: J44.9 300 mL 11  . alendronate (FOSAMAX) 70 MG tablet Take 1 tablet (70 mg total) by mouth every 7 (seven) days. Take with a full glass of water on an empty stomach. (Patient taking differently: Take 70 mg by mouth every Sunday. Take with a full glass of water on an empty stomach.) 13 tablet 3  . aspirin EC 81 MG tablet Take 81 mg by mouth daily.    . budesonide-formoterol (SYMBICORT) 160-4.5 MCG/ACT inhaler USE 2 INHALATIONS TWICE A DAY (Patient taking differently: Inhale 2 puffs into the lungs 2 (two) times daily. ) 3 Inhaler 3  . Misc. Devices (PULSE OXIMETER) MISC 1 application by Does not apply route as needed. 1 each 0  . ondansetron (ZOFRAN-ODT) 4 MG disintegrating tablet PLACE 1 TO 2 TABLETS ON TONGUE AND ALLOW TO DISSOLVE EVERY 8 HOURS AS NEEDED FOR NAUSEA / VOMITING 30 tablet 0  . rosuvastatin (CRESTOR) 40 MG tablet Take 1 tablet (40 mg total) by mouth daily. (  Patient taking differently: Take 40 mg by mouth at bedtime. ) 90 tablet 3  . SPIRIVA RESPIMAT 2.5 MCG/ACT AERS USE 2 INHALATIONS DAILY (Patient taking differently: Inhale 1 puff into the lungs 2 (two) times daily. ) 12 g 3  . SYNTHROID 125 MCG tablet Take 1 tablet (125 mcg total) by mouth daily before breakfast. 90 tablet 3  . traMADol (ULTRAM) 50 MG tablet Take 1 tablet (50 mg total) by mouth every 6 (six) hours as needed for moderate pain or severe pain. 30 tablet 5   No current facility-administered medications for this visit.      Objective: BP (!) 146/84 (BP Location: Left Arm, Cuff Size: Normal)   Pulse (!) 52   Temp 97.7 F (36.5 C) (Oral)   Ht 5\' 9"  (1.753 m)   Wt 153 lb 6.4 oz (69.6 kg)   SpO2 99%   BMI 22.65 kg/m  Gen: NAD, resting comfortably CV: RRR no murmurs rubs or gallops Lungs: CTAB no crackles, wheeze, rhonchi Abdomen: soft/nontender/nondistended/normal bowel sounds. No rebound or guarding.  Ext: no edema Skin: warm, dry  Assessment/Plan:   Acute kidney injury ([place]) - Plan: Basic metabolic panel  S: Patient was seen by me January 7 for suprapubic tenderness and creatinine that bumped at that time to 1.6 up from 1-1.2 range.  We got a renal ultrasound given his history of recent urological surgery which was reassuring.  We plan for repeat a few days later.  Apparently after patient was seen he developed a gastrointestinal illness with severe diarrhea with 20 episodes in 1 day.  He felt wiped out after this for 4 days and barely drank any fluids.  We were unaware of him feeling poorly like this.  He continued benazepril during this time.  We plan to repeat creatinine check and when he returned after the above-creatinine was up over 7.  We sent him to the hospital for acute kidney injury where the above history of GI illness and poor oral intake was discovered.  He was treated by holding blood pressure medication particularly benazepril as well as aggressive IV fluid hydration.  He was hospitalized over 4 days and by the time of discharge creatinine had trended down to the 2.2-2.3 range.  CT stone study on January 13 was unremarkable other than his baseline urological system structure status post nephrectomy of left kidney.  Microalbumin to creatinine ratio was normal.  He did have temporary hypokalemia and hypomagnesemia-these were repleted.  No signs of gastroenteritis while he was in the hospital. A/P: Improved kidney function today with creatinine up to 1.7 and filtration rate up to 40.  We are  going to plan to reevaluate in 2 weeks-encouraged him to continue to push fluids.  Hopefully his kidney function continues to return to baseline with creatinine around 1-1.2  In regards to hypertension- elevated today-recommended restarting amlodipine 5 mg and atenolol 50 mg twice a day.  We will hold benazepril until next visit at least  In regards to other chronic diseases- he may continue on his home rosuvastatin 40 mg-has had stable hyperlipidemia.  He can continue his Synthroid 125 mcg-has had stable hypothyroidism.  For his essential tremor-worsened off of atenolol-he plans to restart this.  Known coronary artery disease-without chest pain or shortness of breath.  Known COPD-no recent issues-continue home Symbicort.   Future Appointments  Date Time Provider [place]  12/27/2018  9:20 AM [name], MD [facility]  01/24/2019 11:00 AM [facility] LAB 3 [facility] None  01/31/2019 10:00 AM Nicholas Lose, MD [facility] None   Lab/Order associations: Acute kidney injury ([place]) - Plan: Basic metabolic panel   Meds ordered this encounter  Medications  . ondansetron (ZOFRAN-ODT) 4 MG disintegrating tablet    Sig: PLACE 1 TO 2 TABLETS ON TONGUE AND ALLOW TO DISSOLVE EVERY 8 HOURS AS NEEDED FOR NAUSEA / VOMITING    Dispense:  30 tablet    Refill:  0    Return precautions advised.  Garret Reddish, MD

## 2018-12-12 NOTE — Patient Instructions (Signed)
Please stop by lab before you go   Start amlodipine and atenolol back-continue these as long as blood pressure is over 100/60  Hold your Fosamax until we make sure your filtration rate is back over 35.  A higher filtration rate is a good thing.  Higher creatinine is a bad thing.  Why do not we go ahead and plan a 2-week blood pressure and kidney check in-we will check blood work again when I see you back

## 2018-12-13 DIAGNOSIS — J441 Chronic obstructive pulmonary disease with (acute) exacerbation: Secondary | ICD-10-CM | POA: Diagnosis not present

## 2018-12-14 ENCOUNTER — Telehealth: Payer: Self-pay | Admitting: Pulmonary Disease

## 2018-12-14 MED ORDER — ALBUTEROL SULFATE (2.5 MG/3ML) 0.083% IN NEBU: 2.5000 mg | INHALATION_SOLUTION | Freq: Four times a day (QID) | RESPIRATORY_TRACT | 11 refills | Status: DC | PRN

## 2018-12-14 MED ORDER — ALBUTEROL SULFATE 108 (90 BASE) MCG/ACT IN AEPB: 2.0000 | INHALATION_SPRAY | RESPIRATORY_TRACT | 1 refills | Status: DC | PRN

## 2018-12-14 NOTE — Telephone Encounter (Signed)
I have spoke to patient he was needing the icd 10 code added to his script for albuterol I have done this he also need his rescue inhaler change to Proair instead I have sent these into the preferred pharmacy  Nothing further needed at this time.

## 2018-12-14 NOTE — Telephone Encounter (Signed)
Patient in the lobby about medication billing issues. Requested to speak with a nurse//fmb

## 2018-12-15 DIAGNOSIS — R69 Illness, unspecified: Secondary | ICD-10-CM | POA: Diagnosis not present

## 2018-12-20 ENCOUNTER — Inpatient Hospital Stay: Payer: Medicare HMO | Admitting: Family Medicine

## 2018-12-27 ENCOUNTER — Encounter: Payer: Self-pay | Admitting: Family Medicine

## 2018-12-27 ENCOUNTER — Ambulatory Visit: Payer: Medicare HMO | Admitting: Family Medicine

## 2018-12-27 VITALS — BP 148/80 | HR 65 | Temp 97.7°F | Ht 69.0 in | Wt 154.0 lb

## 2018-12-27 DIAGNOSIS — I745 Embolism and thrombosis of iliac artery: Secondary | ICD-10-CM

## 2018-12-27 DIAGNOSIS — I10 Essential (primary) hypertension: Secondary | ICD-10-CM | POA: Diagnosis not present

## 2018-12-27 DIAGNOSIS — R7989 Other specified abnormal findings of blood chemistry: Secondary | ICD-10-CM | POA: Diagnosis not present

## 2018-12-27 DIAGNOSIS — G250 Essential tremor: Secondary | ICD-10-CM | POA: Diagnosis not present

## 2018-12-27 DIAGNOSIS — N179 Acute kidney failure, unspecified: Secondary | ICD-10-CM

## 2018-12-27 LAB — BASIC METABOLIC PANEL
BUN: 18 mg/dL (ref 6–23)
CO2: 30 mEq/L (ref 19–32)
Calcium: 9 mg/dL (ref 8.4–10.5)
Chloride: 104 mEq/L (ref 96–112)
Creatinine, Ser: 1.35 mg/dL (ref 0.40–1.50)
GFR: 52.48 mL/min — ABNORMAL LOW (ref >60.00)
Glucose, Bld: 108 mg/dL — ABNORMAL HIGH (ref 70–99)
Potassium: 4.4 mEq/L (ref 3.5–5.1)
Sodium: 142 mEq/L (ref 135–145)

## 2018-12-27 MED ORDER — AMLODIPINE BESYLATE 5 MG PO TABS: 5.0000 mg | ORAL_TABLET | Freq: Every day | ORAL | 3 refills | Status: DC

## 2018-12-27 MED ORDER — ATENOLOL 50 MG PO TABS: 50.0000 mg | ORAL_TABLET | Freq: Two times a day (BID) | ORAL | 3 refills | Status: DC

## 2018-12-27 NOTE — Progress Notes (Signed)
Phone [phone]   Subjective:  Brian Graham is a 68 y.o. year old very pleasant male patient who presents for/with See problem oriented charting ROS- No chest pain or shortness of breath. No headache or blurry vision.  Appetite better- no vomiting or diarrhea. Occasional mild nausea.   Past Medical History-  Patient Active Problem List   Diagnosis Date Noted  . Osteoporosis 06/26/2018    Priority: High  . Atherosclerosis of native arteries of extremity with intermittent claudication ([place]) 07/06/2017    Priority: High  . Weight loss 04/23/2017    Priority: High  . Former smoker 09/01/2016    Priority: High  . COPD, severe ([place]) 08/23/2016    Priority: High  . Hx of CABG 08/05/2016    Priority: High  . CAD (coronary artery disease) of artery bypass graft 08/05/2016    Priority: High  . History of adenomatous polyp of colon 04/22/2017    Priority: Medium  . Hyperlipidemia 04/04/2017    Priority: Medium  . Hypothyroidism (acquired) 04/04/2017    Priority: Medium  . Essential hypertension 08/05/2016    Priority: Medium  . Essential tremor 06/11/2016    Priority: Medium  . Aortic atherosclerosis ([place]) 04/13/2017    Priority: Low  . Seasonal allergies 04/04/2017    Priority: Low  . Myalgia 10/04/2016    Priority: Low  . Dysuria 08/23/2016    Priority: Low  . Chronic pain of both knees 08/23/2016    Priority: Low  . Tension headache 06/11/2016    Priority: Low  . S/p nephrectomy 12/05/2018  . Gastroenteritis, acute 12/05/2018  . AKI (acute kidney injury) ([place]) 12/04/2018  . MGUS (monoclonal gammopathy of unknown significance) 08/02/2018  . Perennial allergic rhinitis 01/24/2018  . 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 Urinary tract infection 09/03/2017  . Constipation 09/03/2017  . Anxiety 07/24/2017  . Pyelonephritis 04/23/2017    Medications- reviewed and updated Current Outpatient Medications  Medication Sig  Dispense Refill  . acetaminophen (TYLENOL) 500 MG tablet Take 500-1,000 mg by mouth every 6 (six) hours as needed (for pain).     Marland Kitchen albuterol (PROVENTIL) (2.5 MG/3ML) 0.083% nebulizer solution Take 3 mLs (2.5 mg total) by nebulization every 6 (six) hours as needed for wheezing or shortness of breath. J44.9 300 mL 11  . Albuterol Sulfate (PROAIR RESPICLICK) 683 (90 Base) MCG/ACT AEPB Inhale 2 puffs into the lungs every 4 (four) hours as needed. 3 each 1  . alendronate (FOSAMAX) 70 MG tablet Take 1 tablet (70 mg total) by mouth every 7 (seven) days. Take with a full glass of water on an empty stomach. (Patient taking differently: Take 70 mg by mouth every Sunday. Take with a full glass of water on an empty stomach.) 13 tablet 3  . amLODipine (NORVASC) 5 MG tablet Take 1 tablet (5 mg total) by mouth daily. 90 tablet 3  . aspirin EC 81 MG tablet Take 81 mg by mouth daily.    Marland Kitchen atenolol (TENORMIN) 50 MG tablet Take 1 tablet (50 mg total) by mouth 2 (two) times daily. 180 tablet 3  . budesonide-formoterol (SYMBICORT) 160-4.5 MCG/ACT inhaler USE 2 INHALATIONS TWICE A DAY (Patient taking differently: Inhale 2 puffs into the lungs 2 (two) times daily. ) 3 Inhaler 3  . Misc. Devices (PULSE OXIMETER) MISC 1 application by Does not apply route as needed. 1 each 0  . ondansetron (ZOFRAN-ODT) 4 MG disintegrating tablet PLACE 1 TO 2 TABLETS ON TONGUE AND  ALLOW TO DISSOLVE EVERY 8 HOURS AS NEEDED FOR NAUSEA / VOMITING 30 tablet 0  . rosuvastatin (CRESTOR) 40 MG tablet Take 1 tablet (40 mg total) by mouth daily. (Patient taking differently: Take 40 mg by mouth at bedtime. ) 90 tablet 3  . SPIRIVA RESPIMAT 2.5 MCG/ACT AERS USE 2 INHALATIONS DAILY (Patient taking differently: Inhale 1 puff into the lungs 2 (two) times daily. ) 12 g 3  . SYNTHROID 125 MCG tablet Take 1 tablet (125 mcg total) by mouth daily before breakfast. 90 tablet 3  . traMADol (ULTRAM) 50 MG tablet Take 1 tablet (50 mg total) by mouth every 6 (six)  hours as needed for moderate pain or severe pain. 30 tablet 5   No current facility-administered medications for this visit.      Objective:  BP (!) 148/80   Pulse 65   Temp 97.7 F (36.5 C) (Oral)   Ht 5\' 9"  (1.753 m)   Wt 154 lb (69.9 kg)   SpO2 93%   BMI 22.74 kg/m  Gen: NAD, resting comfortably CV: RRR no murmurs rubs or gallops Lungs: CTAB no crackles, wheeze, rhonchi Abdomen: soft/nontender/nondistended/normal bowel sounds.  Ext: no edema Skin: warm, dry     Assessment and Plan   #AKI-improving but still with creatinine elevation at last visit #Hypertension #Essential tremor S: Patient was seen 2 weeks ago for hospital follow-up for acute kidney injury due to dehydration-creatinine was trending down-does have unilateral kidney.  Last visit I recommended he restart his amlodipine 5 mg and atenolol 50 mg twice a day.  Our plan was to hold benazepril until follow-up today.  Our hope was restarting the atenolol would also help with his essential tremor-he reports this has been very helpful  Home #s 140s over 80s A/P: With elevated creatinine/prior AKI-we will repeat BMP today - Hypertension still poorly controlled on amlodipine 5 mg and atenolol 50 mg twice a day- if creatinine has improved likely restart benazepril and then recheck blood pressure and creatinine in 2 to 3 weeks - Tremor is better back on atenolol-we will continue   Essential hypertension Improved back on Amlodipine 5mg , atenolol 50mg  BID-likely restart benazepril 20mg  if creatinine is further improved-see above  Iliac artery occlusion (HCC) Has seen Dr. Chen-s/p L CIA PTA+S for L CIA occlusion in late 2018.  Last follow-up was March 2020 and appeared plan was follow-up in 1 year- I do not see this yet scheduled and we will discuss at next visit    Future Appointments  Date Time Provider [place]  01/10/2019  9:20 AM [name], MD [facility]  01/24/2019 11:00 AM [facility] LAB 3  [facility] None  01/31/2019 10:00 AM Nicholas Lose, MD [facility] None  2-week follow-up planned  Lab/Order associations: Essential hypertension - Plan: Basic metabolic panel  Essential tremor  Elevated serum creatinine  Acute kidney injury ([place])  Meds ordered this encounter  Medications  . amLODipine (NORVASC) 5 MG tablet    Sig: Take 1 tablet (5 mg total) by mouth daily.    Dispense:  90 tablet    Refill:  3  . atenolol (TENORMIN) 50 MG tablet    Sig: Take 1 tablet (50 mg total) by mouth 2 (two) times daily.    Dispense:  180 tablet    Refill:  3    Return precautions advised.  Garret Reddish, MD

## 2018-12-27 NOTE — Assessment & Plan Note (Signed)
Has seen Dr. Chen-s/p L CIA PTA+S for L CIA occlusion in late 2018.  Last follow-up was March 2020 and appeared plan was follow-up in 1 year- I do not see this yet scheduled and we will discuss at next visit

## 2018-12-27 NOTE — Patient Instructions (Addendum)
Please stop by lab before you go If you do not have mychart- we will call you about results within 5 business days of [place] receiving them.  If you have mychart- we will send your results within 3 business days of [place] receiving them.  If abnormal or we want to clarify a result, we will call or mychart you to make sure you receive the message.  If you have questions or concerns or don't hear within 5-7 days, please send [place] a message or call us.   We may restart the benazepril- if I do not comment on this when we send you a message- please reach back out to [place]. If we restart- I would plan to still recheck kidneys in 2-3 weeks - we could see you back at that time to recheck blood pressure as well

## 2018-12-27 NOTE — Assessment & Plan Note (Signed)
Improved back on Amlodipine 5mg , atenolol 50mg  BID-likely restart benazepril 20mg  if creatinine is further improved

## 2019-01-10 ENCOUNTER — Ambulatory Visit: Payer: Medicare HMO | Admitting: Family Medicine

## 2019-01-12 ENCOUNTER — Telehealth: Payer: Self-pay | Admitting: Hematology and Oncology

## 2019-01-12 NOTE — Telephone Encounter (Signed)
[place] 3/11 moved f/u from AM to PM. Left message for patient. Schedule mailed. Other appointments remain the same.

## 2019-01-13 DIAGNOSIS — J441 Chronic obstructive pulmonary disease with (acute) exacerbation: Secondary | ICD-10-CM | POA: Diagnosis not present

## 2019-01-24 ENCOUNTER — Inpatient Hospital Stay: Payer: Medicare HMO | Attending: Hematology and Oncology

## 2019-01-24 DIAGNOSIS — Z79899 Other long term (current) drug therapy: Secondary | ICD-10-CM | POA: Diagnosis not present

## 2019-01-24 DIAGNOSIS — D472 Monoclonal gammopathy: Secondary | ICD-10-CM | POA: Diagnosis present

## 2019-01-24 DIAGNOSIS — M85819 Other specified disorders of bone density and structure, unspecified shoulder: Secondary | ICD-10-CM | POA: Insufficient documentation

## 2019-01-24 LAB — COMPREHENSIVE METABOLIC PANEL
ALT: 14 U/L (ref 0–44)
AST: 16 U/L (ref 15–41)
Albumin: 4.2 g/dL (ref 3.5–5.0)
Alkaline Phosphatase: 50 U/L (ref 38–126)
Anion gap: 7 (ref 5–15)
BUN: 25 mg/dL — AB (ref 8–23)
CO2: 29 mmol/L (ref 22–32)
CREATININE: 1.52 mg/dL — AB (ref 0.61–1.24)
Calcium: 8.9 mg/dL (ref 8.9–10.3)
Chloride: 103 mmol/L (ref 98–111)
GFR calc Af Amer: 54 mL/min — ABNORMAL LOW (ref >60)
GFR calc non Af Amer: 46 mL/min — ABNORMAL LOW (ref >60)
Glucose, Bld: 97 mg/dL (ref 70–99)
POTASSIUM: 4.4 mmol/L (ref 3.5–5.1)
Sodium: 139 mmol/L (ref 135–145)
Total Bilirubin: 1.1 mg/dL (ref 0.3–1.2)
Total Protein: 7.7 g/dL (ref 6.5–8.1)

## 2019-01-24 LAB — CBC WITH DIFFERENTIAL (CANCER CENTER ONLY)
Abs Immature Granulocytes: 0.02 10*3/uL (ref 0.00–0.07)
BASOS PCT: 1 %
Basophils Absolute: 0.1 10*3/uL (ref 0.0–0.1)
EOS ABS: 0.4 10*3/uL (ref 0.0–0.5)
Eosinophils Relative: 5 %
HCT: 40.8 % (ref 39.0–52.0)
Hemoglobin: 13.1 g/dL (ref 13.0–17.0)
Immature Granulocytes: 0 %
Lymphocytes Relative: 19 %
Lymphs Abs: 1.6 10*3/uL (ref 0.7–4.0)
MCH: 30.1 pg (ref 26.0–34.0)
MCHC: 32.1 g/dL (ref 30.0–36.0)
MCV: 93.8 fL (ref 80.0–100.0)
Monocytes Absolute: 0.8 10*3/uL (ref 0.1–1.0)
Monocytes Relative: 10 %
Neutro Abs: 5.7 10*3/uL (ref 1.7–7.7)
Neutrophils Relative %: 65 %
Platelet Count: 260 10*3/uL (ref 150–400)
RBC: 4.35 MIL/uL (ref 4.22–5.81)
RDW: 13.8 % (ref 11.5–15.5)
WBC Count: 8.7 10*3/uL (ref 4.0–10.5)
nRBC: 0 % (ref 0.0–0.2)

## 2019-01-25 LAB — KAPPA/LAMBDA LIGHT CHAINS
Kappa free light chain: 44 mg/L — ABNORMAL HIGH (ref 3.3–19.4)
Kappa, lambda light chain ratio: 1.81 — ABNORMAL HIGH (ref 0.26–1.65)
Lambda free light chains: 24.3 mg/L (ref 5.7–26.3)

## 2019-01-30 LAB — MULTIPLE MYELOMA PANEL, SERUM
ALBUMIN/GLOB SERPL: 1.2 (ref 0.7–1.7)
ALPHA 1: 0.3 g/dL (ref 0.0–0.4)
Albumin SerPl Elph-Mcnc: 3.6 g/dL (ref 2.9–4.4)
Alpha2 Glob SerPl Elph-Mcnc: 1.1 g/dL — ABNORMAL HIGH (ref 0.4–1.0)
B-Globulin SerPl Elph-Mcnc: 0.8 g/dL (ref 0.7–1.3)
Gamma Glob SerPl Elph-Mcnc: 1 g/dL (ref 0.4–1.8)
Globulin, Total: 3.2 g/dL (ref 2.2–3.9)
IgA: 249 mg/dL (ref 61–437)
IgG (Immunoglobin G), Serum: 904 mg/dL (ref 700–1600)
IgM (Immunoglobulin M), Srm: 145 mg/dL (ref 20–172)
Total Protein ELP: 6.8 g/dL (ref 6.0–8.5)

## 2019-01-30 NOTE — Progress Notes (Signed)
Patient Care Team: [name], MD as PCP - General (Family Medicine) Juanito Doom, MD as Consulting Physician (Pulmonary Disease) Minus Breeding, MD as Consulting Physician (Cardiology) Lyndee Hensen, PT as Physical Therapist (Physical Therapy)  DIAGNOSIS:    ICD-10-CM   1. MGUS (monoclonal gammopathy of unknown significance) D47.2 Beta 2 microglobulin, serum    Multiple Myeloma Panel (SPEP&IFE w/QIG)    Kappa/lambda light chains    CBC with Differential ([place])    CMP ([place] only)    CHIEF COMPLIANT: Follow-up of MGUS  INTERVAL HISTORY: Brian Graham is a 68 y.o. with above-mentioned history of MGUS. On 12/04/18 he presented to the ED for worsening kidney function following a diarrheal illness. His most recent labs from 01/24/19 showed Hg 13.1, creatinine 1.52, kappa light chain 44.0, lambda light chain 24.3, kappa lambda ratio 1.81, IgG 904, m protein not detected. He presents to the clinic alone today and is doing well. He reports his creatinine levels have been decreasing as he has recovered from his illness in January. He reviewed his medication list with me.   REVIEW OF SYSTEMS:   Constitutional: Denies fevers, chills or abnormal weight loss Eyes: Denies blurriness of vision Ears, nose, mouth, throat, and face: Denies mucositis or sore throat Respiratory: Denies cough, dyspnea or wheezes Cardiovascular: Denies palpitation, chest discomfort Gastrointestinal: Denies nausea, heartburn or change in bowel habits Skin: Denies abnormal skin rashes Lymphatics: Denies new lymphadenopathy or easy bruising Neurological: Denies numbness, tingling or new weaknesses Behavioral/Psych: Mood is stable, no new changes  Extremities: No lower extremity edema All other systems were reviewed with the patient and are negative.  I have reviewed the past medical history, past surgical history, social history and family history with the patient and they are  unchanged from previous note.  ALLERGIES:  is allergic to other; keflex [cephalexin]; sulfa antibiotics; ciprofloxacin; levaquin [levofloxacin]; and omeprazole.  MEDICATIONS:  Current Outpatient Medications  Medication Sig Dispense Refill  . acetaminophen (TYLENOL) 500 MG tablet Take 500-1,000 mg by mouth every 6 (six) hours as needed (for pain).     Marland Kitchen albuterol (PROVENTIL) (2.5 MG/3ML) 0.083% nebulizer solution Take 3 mLs (2.5 mg total) by nebulization every 6 (six) hours as needed for wheezing or shortness of breath. J44.9 300 mL 11  . Albuterol Sulfate (PROAIR RESPICLICK) 638 (90 Base) MCG/ACT AEPB Inhale 2 puffs into the lungs every 4 (four) hours as needed. 3 each 1  . alendronate (FOSAMAX) 70 MG tablet Take 1 tablet (70 mg total) by mouth every 7 (seven) days. Take with a full glass of water on an empty stomach. (Patient taking differently: Take 70 mg by mouth every Sunday. Take with a full glass of water on an empty stomach.) 13 tablet 3  . amLODipine (NORVASC) 5 MG tablet Take 1 tablet (5 mg total) by mouth daily. 90 tablet 3  . aspirin EC 81 MG tablet Take 81 mg by mouth daily.    Marland Kitchen atenolol (TENORMIN) 50 MG tablet Take 1 tablet (50 mg total) by mouth 2 (two) times daily. 180 tablet 3  . budesonide-formoterol (SYMBICORT) 160-4.5 MCG/ACT inhaler USE 2 INHALATIONS TWICE A DAY (Patient taking differently: Inhale 2 puffs into the lungs 2 (two) times daily. ) 3 Inhaler 3  . Misc. Devices (PULSE OXIMETER) MISC 1 application by Does not apply route as needed. 1 each 0  . ondansetron (ZOFRAN-ODT) 4 MG disintegrating tablet PLACE 1 TO 2 TABLETS ON TONGUE AND ALLOW TO DISSOLVE EVERY 8  HOURS AS NEEDED FOR NAUSEA / VOMITING 30 tablet 0  . rosuvastatin (CRESTOR) 40 MG tablet Take 1 tablet (40 mg total) by mouth daily. (Patient taking differently: Take 40 mg by mouth at bedtime. ) 90 tablet 3  . SPIRIVA RESPIMAT 2.5 MCG/ACT AERS USE 2 INHALATIONS DAILY (Patient taking differently: Inhale 1 puff into the  lungs 2 (two) times daily. ) 12 g 3  . SYNTHROID 125 MCG tablet Take 1 tablet (125 mcg total) by mouth daily before breakfast. 90 tablet 3  . traMADol (ULTRAM) 50 MG tablet Take 1 tablet (50 mg total) by mouth every 6 (six) hours as needed for moderate pain or severe pain. 30 tablet 5   No current facility-administered medications for this visit.     PHYSICAL EXAMINATION: ECOG PERFORMANCE STATUS: 0 - Asymptomatic  Vitals:   01/31/19 1435  BP: 133/66  Pulse: 71  Resp: 17  Temp: 97.8 F (36.6 C)  SpO2: 94%   Filed Weights   01/31/19 1435  Weight: 152 lb (68.9 kg)    GENERAL: alert, no distress and comfortable SKIN: skin color, texture, turgor are normal, no rashes or significant lesions EYES: normal, Conjunctiva are pink and non-injected, sclera clear OROPHARYNX: no exudate, no erythema and lips, buccal mucosa, and tongue normal  NECK: supple, thyroid normal size, non-tender, without nodularity LYMPH: no palpable lymphadenopathy in the cervical, axillary or inguinal LUNGS: clear to auscultation and percussion with normal breathing effort HEART: regular rate & rhythm and no murmurs and no lower extremity edema ABDOMEN: abdomen soft, non-tender and normal bowel sounds MUSCULOSKELETAL: no cyanosis of digits and no clubbing  NEURO: alert & oriented x 3 with fluent speech, no focal motor/sensory deficits EXTREMITIES: No lower extremity edema  LABORATORY DATA:  I have reviewed the data as listed CMP Latest Ref Rng & Units 01/24/2019 12/27/2018 12/12/2018  Glucose 70 - 99 mg/dL 97 108(H) 81  BUN 8 - 23 mg/dL 25(H) 18 18  Creatinine 0.61 - 1.24 mg/dL 1.52(H) 1.35 1.70(H)  Sodium 135 - 145 mmol/L 139 142 141  Potassium 3.5 - 5.1 mmol/L 4.4 4.4 4.8  Chloride 98 - 111 mmol/L 103 104 103  CO2 22 - 32 mmol/L 29 30 30   Calcium 8.9 - 10.3 mg/dL 8.9 9.0 8.7  Total Protein 6.5 - 8.1 g/dL 7.7 - -  Total Bilirubin 0.3 - 1.2 mg/dL 1.1 - -  Alkaline Phos 38 - 126 U/L 50 - -  AST 15 - 41 U/L 16  - -  ALT 0 - 44 U/L 14 - -    Lab Results  Component Value Date   WBC 8.7 01/24/2019   HGB 13.1 01/24/2019   HCT 40.8 01/24/2019   MCV 93.8 01/24/2019   PLT 260 01/24/2019   NEUTROABS 5.7 01/24/2019    ASSESSMENT & PLAN:  MGUS (monoclonal gammopathy of unknown significance) Elevated light chains: Kappa 80, K:L ratio 14, SPEP revealed no significant M protein. Normal hemoglobin, creatinine, calcium Bone survey: No lytic lesions X-ray of the shoulder reveal osteopenia.  No clear lytic lesions. The entire work-up was initiated because patient fell down steps and hurt his left shoulder.  X-ray of the shoulder revealed changes that were slightly concerning for myeloma although there were no lytic changes.  This led to blood work that showed elevated kappa and that prompted the referral.  Lab review 01/24/2019: CBC is normal hemoglobin 13.1 Kappa 44, lambda 24.3, ratio 1.81 SPEP: No M protein, normal immunofixation Creatinine 1.52, calcium 8.9  Renal  failure due to dehydration: He was hospitalized and received IV fluids with marked improvement.  His creatinine is once again slightly elevated and he will increase his fluid intake.  I reviewed the blood work which showed an elevation of kappa light chain but no evidence of monoclonal protein. I recommended continued watchful monitoring with recheck of blood work in 1 year.  If after year his labs look stable then he may be seen on an as-needed basis.    Orders Placed This Encounter  Procedures  . Beta 2 microglobulin, serum    Standing Status:   Future    Standing Expiration Date:   03/06/2020  . Multiple Myeloma Panel (SPEP&IFE w/QIG)    Standing Status:   Future    Standing Expiration Date:   03/06/2020  . Kappa/lambda light chains    Standing Status:   Future    Standing Expiration Date:   03/06/2020  . CBC with Differential ([facility] Only)    Standing Status:   Future    Standing Expiration Date:   01/31/2020  . CMP ([place] only)    Standing Status:   Future    Standing Expiration Date:   01/31/2020   The patient has a good understanding of the overall plan. he agrees with it. he will call with any problems that may develop before the next visit here.  Nicholas Lose, MD 01/31/2019  Julious Oka Dorshimer am acting as scribe for Dr. Nicholas Lose.  I have reviewed the above documentation for accuracy and completeness, and I agree with the above.

## 2019-01-31 ENCOUNTER — Other Ambulatory Visit: Payer: Self-pay

## 2019-01-31 ENCOUNTER — Inpatient Hospital Stay (HOSPITAL_BASED_OUTPATIENT_CLINIC_OR_DEPARTMENT_OTHER): Payer: Medicare HMO | Admitting: Hematology and Oncology

## 2019-01-31 DIAGNOSIS — D472 Monoclonal gammopathy: Secondary | ICD-10-CM | POA: Diagnosis not present

## 2019-01-31 DIAGNOSIS — Z79899 Other long term (current) drug therapy: Secondary | ICD-10-CM

## 2019-01-31 DIAGNOSIS — M85819 Other specified disorders of bone density and structure, unspecified shoulder: Secondary | ICD-10-CM | POA: Diagnosis not present

## 2019-01-31 NOTE — Assessment & Plan Note (Signed)
Elevated light chains: Kappa 80, K:L ratio 14, SPEP revealed no significant M protein. Normal hemoglobin, creatinine, calcium Bone survey: No lytic lesions X-ray of the shoulder reveal osteopenia.  No clear lytic lesions. The entire work-up was initiated because patient fell down steps and hurt his left shoulder.  X-ray of the shoulder revealed changes that were slightly concerning for myeloma although there were no lytic changes.  This led to blood work that showed elevated kappa and that prompted the referral.  Lab review 01/24/2019: CBC is normal hemoglobin 13.1 Kappa 44, lambda 24.3, ratio 1.81 SPEP: No M protein, normal immunofixation Creatinine 1.52, calcium 8.9  I reviewed the blood work which showed an elevation of kappa light chain but no evidence of monoclonal protein. I recommended continued watchful monitoring with recheck of blood work in 1 year.

## 2019-02-07 ENCOUNTER — Encounter: Payer: Self-pay | Admitting: Family Medicine

## 2019-02-07 ENCOUNTER — Other Ambulatory Visit: Payer: Self-pay

## 2019-02-07 ENCOUNTER — Ambulatory Visit: Payer: Medicare HMO | Admitting: Family Medicine

## 2019-02-07 VITALS — BP 102/70 | HR 66 | Temp 97.6°F | Ht 69.0 in | Wt 153.0 lb

## 2019-02-07 DIAGNOSIS — I10 Essential (primary) hypertension: Secondary | ICD-10-CM | POA: Diagnosis not present

## 2019-02-07 DIAGNOSIS — E785 Hyperlipidemia, unspecified: Secondary | ICD-10-CM

## 2019-02-07 DIAGNOSIS — E039 Hypothyroidism, unspecified: Secondary | ICD-10-CM | POA: Diagnosis not present

## 2019-02-07 LAB — BASIC METABOLIC PANEL
BUN: 25 mg/dL — ABNORMAL HIGH (ref 6–23)
CO2: 32 mEq/L (ref 19–32)
Calcium: 9.4 mg/dL (ref 8.4–10.5)
Chloride: 100 mEq/L (ref 96–112)
Creatinine, Ser: 1.88 mg/dL — ABNORMAL HIGH (ref 0.40–1.50)
GFR: 35.8 mL/min — ABNORMAL LOW (ref >60.00)
GLUCOSE: 86 mg/dL (ref 70–99)
Potassium: 4.5 mEq/L (ref 3.5–5.1)
Sodium: 138 mEq/L (ref 135–145)

## 2019-02-07 LAB — LDL CHOLESTEROL, DIRECT: Direct LDL: 45 mg/dL

## 2019-02-07 LAB — TSH: TSH: 0.22 u[IU]/mL — ABNORMAL LOW (ref 0.35–4.50)

## 2019-02-07 NOTE — Assessment & Plan Note (Signed)
S: controlled on amlodipine 5 mg, atenolol 50 mg twice daily- with creatinine improvement last visit we restarted benazepril 20 mg  Blood pressure at home 120s/78.  BP Readings from Last 3 Encounters:  02/07/19 102/70  01/31/19 133/66  12/27/18 (!) 148/80  A/P: Blood pressure seems to be running low normal but looks really good at home- creatinine worsened on full dose benazepril 20 mg- if it is not improved-we will decrease him to 10 mg dose-can send this in in the future if needed

## 2019-02-07 NOTE — Patient Instructions (Addendum)
If kidney function has not improved at all- lets cut benazepril in half to 10mg  and try that from home- let me know how blood pressures look in a month fi we make that change  Please stop by lab before you go If you do not have mychart- we will call you about results within 5 business days of [place] receiving them.  If you have mychart- we will send your results within 3 business days of [place] receiving them.  If abnormal or we want to clarify a result, we will call or mychart you to make sure you receive the message.  If you have questions or concerns or don't hear within 5-7 days, please send [place] a message or call us.

## 2019-02-07 NOTE — Progress Notes (Signed)
Phone [phone]   Subjective:  Brian Graham is a 68 y.o. year old very pleasant male patient who presents for/with See problem oriented charting ROS- some aching in morning seems to be entire morning- if takes tramadol it resolves. Tried new mattress but didn't help.  No chest pain or shortness of breath. No headache or blurry vision.   Past Medical History-  Patient Active Problem List   Diagnosis Date Noted  . Osteoporosis 06/26/2018    Priority: High  . Atherosclerosis of native arteries of extremity with intermittent claudication ([place]) 07/06/2017    Priority: High  . Weight loss 04/23/2017    Priority: High  . Former smoker 09/01/2016    Priority: High  . COPD, severe ([place]) 08/23/2016    Priority: High  . Hx of CABG 08/05/2016    Priority: High  . CAD (coronary artery disease) of artery bypass graft 08/05/2016    Priority: High  . 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 History of adenomatous polyp of colon 04/22/2017    Priority: Medium  . Hyperlipidemia 04/04/2017    Priority: Medium  . Hypothyroidism (acquired) 04/04/2017    Priority: Medium  . Essential hypertension 08/05/2016    Priority: Medium  . Essential tremor 06/11/2016    Priority: Medium  . Aortic atherosclerosis ([place]) 04/13/2017    Priority: Low  . Seasonal allergies 04/04/2017    Priority: Low  . Myalgia 10/04/2016    Priority: Low  . Dysuria 08/23/2016    Priority: Low  . Chronic pain of both knees 08/23/2016    Priority: Low  . Tension headache 06/11/2016    Priority: Low  . S/p nephrectomy 12/05/2018  . Gastroenteritis, acute 12/05/2018  . AKI (acute kidney injury) ([place]) 12/04/2018  . MGUS (monoclonal gammopathy of unknown significance) 08/02/2018  . Perennial allergic rhinitis 01/24/2018  . 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 Urinary tract infection 09/03/2017  . Constipation 09/03/2017  . Anxiety 07/24/2017  . Pyelonephritis 04/23/2017    Medications-  reviewed and updated Current Outpatient Medications  Medication Sig Dispense Refill  . acetaminophen (TYLENOL) 500 MG tablet Take 500-1,000 mg by mouth every 6 (six) hours as needed (for pain).     Marland Kitchen albuterol (PROVENTIL) (2.5 MG/3ML) 0.083% nebulizer solution Take 3 mLs (2.5 mg total) by nebulization every 6 (six) hours as needed for wheezing or shortness of breath. J44.9 300 mL 11  . Albuterol Sulfate (PROAIR RESPICLICK) 825 (90 Base) MCG/ACT AEPB Inhale 2 puffs into the lungs every 4 (four) hours as needed. 3 each 1  . alendronate (FOSAMAX) 70 MG tablet Take 1 tablet (70 mg total) by mouth every 7 (seven) days. Take with a full glass of water on an empty stomach. (Patient taking differently: Take 70 mg by mouth every Sunday. Take with a full glass of water on an empty stomach.) 13 tablet 3  . amLODipine (NORVASC) 5 MG tablet Take 1 tablet (5 mg total) by mouth daily. 90 tablet 3  . aspirin EC 81 MG tablet Take 81 mg by mouth daily.    Marland Kitchen atenolol (TENORMIN) 50 MG tablet Take 1 tablet (50 mg total) by mouth 2 (two) times daily. 180 tablet 3  . budesonide-formoterol (SYMBICORT) 160-4.5 MCG/ACT inhaler USE 2 INHALATIONS TWICE A DAY (Patient taking differently: Inhale 2 puffs into the lungs 2 (two) times daily. ) 3 Inhaler 3  . Misc. Devices (PULSE OXIMETER) MISC 1 application by Does not apply route as needed. 1 each 0  .  ondansetron (ZOFRAN-ODT) 4 MG disintegrating tablet PLACE 1 TO 2 TABLETS ON TONGUE AND ALLOW TO DISSOLVE EVERY 8 HOURS AS NEEDED FOR NAUSEA / VOMITING 30 tablet 0  . rosuvastatin (CRESTOR) 40 MG tablet Take 1 tablet (40 mg total) by mouth daily. (Patient taking differently: Take 40 mg by mouth at bedtime. ) 90 tablet 3  . SPIRIVA RESPIMAT 2.5 MCG/ACT AERS USE 2 INHALATIONS DAILY (Patient taking differently: Inhale 1 puff into the lungs 2 (two) times daily. ) 12 g 3  . SYNTHROID 125 MCG tablet Take 1 tablet (125 mcg total) by mouth daily before breakfast. 90 tablet 3  . traMADol  (ULTRAM) 50 MG tablet Take 1 tablet (50 mg total) by mouth every 6 (six) hours as needed for moderate pain or severe pain. 30 tablet 5   No current facility-administered medications for this visit.      Objective:  BP 102/70 (BP Location: Left Arm, Patient Position: Sitting, Cuff Size: Normal)   Pulse 66   Temp 97.6 F (36.4 C) (Oral)   Ht 5\' 9"  (1.753 m)   Wt 153 lb (69.4 kg)   SpO2 92%   BMI 22.59 kg/m  Gen: NAD, resting comfortably CV: RRR no murmurs rubs or gallops Lungs: CTAB no crackles, wheeze, rhonchi Abdomen: soft/nontender/nondistended Ext: no edema Skin: warm, dry Neuro: Normal gait and speech    Assessment and Plan   #hypertension S: controlled on amlodipine 5 mg, atenolol 50 mg twice daily- with creatinine improvement last visit we restarted benazepril 20 mg  Blood pressure at home 120s/78.  BP Readings from Last 3 Encounters:  02/07/19 102/70  01/31/19 133/66  12/27/18 (!) 148/80  A/P: Blood pressure seems to be running low normal but looks really good at home- creatinine worsened on full dose benazepril 20 mg- if it is not improved-we will decrease him to 10 mg dose-can send this in in the future if needed   #hypothyroidism S: On thyroid medication-Synthroid 125 mcg Lab Results  Component Value Date   TSH 1.38 05/22/2018   A/P:  Stable. Continue current medications.  #hyperlipidemia S: well controlled on rosuvastatin 40mg  Lab Results  Component Value Date   CHOL 167 05/22/2018   HDL 64.90 05/22/2018   LDLCALC 68 05/22/2018   TRIG 168.0 (H) 05/22/2018   CHOLHDL 3 05/22/2018   A/P: likely stable- update LDL today  #Diffuse arthralgias/pains in the morning-resolves with tramadol daily- willing to refill  Future Appointments  Date Time Provider [place]  03/06/2019  9:00 AM [facility] - [facility]  03/06/2019 10:00 AM [facility] - [facility]  03/06/2019 10:15 AM Marty Heck, MD [facility]  01/24/2020 11:00 AM  [facility] 3 [facility] None  01/31/2020 10:00 AM Nicholas Lose, MD [facility] None   Did not schedule planned follow-up-6 months would be reasonable  Lab/Order associations: Hypothyroidism (acquired) - Plan: TSH  Essential hypertension - Plan: Basic metabolic panel, LDL cholesterol, direct  Hyperlipidemia, unspecified hyperlipidemia type  Return precautions advised.  Garret Reddish, MD

## 2019-02-08 ENCOUNTER — Other Ambulatory Visit: Payer: Self-pay

## 2019-02-08 DIAGNOSIS — R7989 Other specified abnormal findings of blood chemistry: Secondary | ICD-10-CM

## 2019-02-08 DIAGNOSIS — E039 Hypothyroidism, unspecified: Secondary | ICD-10-CM

## 2019-02-11 DIAGNOSIS — J441 Chronic obstructive pulmonary disease with (acute) exacerbation: Secondary | ICD-10-CM | POA: Diagnosis not present

## 2019-02-18 ENCOUNTER — Other Ambulatory Visit: Payer: Self-pay | Admitting: Family Medicine

## 2019-02-26 ENCOUNTER — Other Ambulatory Visit: Payer: Self-pay

## 2019-02-26 DIAGNOSIS — I739 Peripheral vascular disease, unspecified: Secondary | ICD-10-CM

## 2019-02-26 DIAGNOSIS — I745 Embolism and thrombosis of iliac artery: Secondary | ICD-10-CM

## 2019-03-01 ENCOUNTER — Encounter: Payer: Self-pay | Admitting: Family Medicine

## 2019-03-01 ENCOUNTER — Other Ambulatory Visit (INDEPENDENT_AMBULATORY_CARE_PROVIDER_SITE_OTHER): Payer: Medicare HMO

## 2019-03-01 ENCOUNTER — Other Ambulatory Visit: Payer: Self-pay

## 2019-03-01 DIAGNOSIS — E039 Hypothyroidism, unspecified: Secondary | ICD-10-CM

## 2019-03-01 DIAGNOSIS — R7989 Other specified abnormal findings of blood chemistry: Secondary | ICD-10-CM | POA: Diagnosis not present

## 2019-03-01 LAB — BASIC METABOLIC PANEL
BUN: 18 mg/dL (ref 6–23)
CO2: 31 mEq/L (ref 19–32)
Calcium: 9.3 mg/dL (ref 8.4–10.5)
Chloride: 100 mEq/L (ref 96–112)
Creatinine, Ser: 1.67 mg/dL — ABNORMAL HIGH (ref 0.40–1.50)
GFR: 41.03 mL/min — ABNORMAL LOW (ref >60.00)
Glucose, Bld: 126 mg/dL — ABNORMAL HIGH (ref 70–99)
Potassium: 4.4 mEq/L (ref 3.5–5.1)
Sodium: 139 mEq/L (ref 135–145)

## 2019-03-01 LAB — TSH: TSH: 0.23 u[IU]/mL — ABNORMAL LOW (ref 0.35–4.50)

## 2019-03-02 ENCOUNTER — Encounter: Payer: Self-pay | Admitting: Family Medicine

## 2019-03-05 ENCOUNTER — Other Ambulatory Visit: Payer: Self-pay

## 2019-03-05 DIAGNOSIS — E039 Hypothyroidism, unspecified: Secondary | ICD-10-CM

## 2019-03-05 DIAGNOSIS — R7989 Other specified abnormal findings of blood chemistry: Secondary | ICD-10-CM

## 2019-03-06 ENCOUNTER — Ambulatory Visit: Payer: Medicare HMO | Admitting: Vascular Surgery

## 2019-03-06 ENCOUNTER — Encounter (HOSPITAL_COMMUNITY): Payer: Medicare HMO

## 2019-03-14 DIAGNOSIS — J441 Chronic obstructive pulmonary disease with (acute) exacerbation: Secondary | ICD-10-CM | POA: Diagnosis not present

## 2019-03-25 ENCOUNTER — Encounter: Payer: Self-pay | Admitting: Family Medicine

## 2019-03-26 ENCOUNTER — Ambulatory Visit (INDEPENDENT_AMBULATORY_CARE_PROVIDER_SITE_OTHER): Payer: Medicare HMO | Admitting: Family Medicine

## 2019-03-26 ENCOUNTER — Encounter: Payer: Self-pay | Admitting: Family Medicine

## 2019-03-26 VITALS — BP 128/71 | HR 68 | Temp 96.5°F | Ht 69.0 in

## 2019-03-26 DIAGNOSIS — T148XXA Other injury of unspecified body region, initial encounter: Secondary | ICD-10-CM | POA: Diagnosis not present

## 2019-03-26 DIAGNOSIS — D692 Other nonthrombocytopenic purpura: Secondary | ICD-10-CM

## 2019-03-26 DIAGNOSIS — E039 Hypothyroidism, unspecified: Secondary | ICD-10-CM

## 2019-03-26 MED ORDER — SYNTHROID 112 MCG PO TABS: 112.0000 ug | ORAL_TABLET | Freq: Every day | ORAL | 3 refills | Status: DC

## 2019-03-26 NOTE — Progress Notes (Addendum)
Phone [phone]   Subjective:  Virtual visit via Video note. Chief complaint: Chief Complaint  Patient presents with  . bruises on back   This visit type was conducted due to national recommendations for restrictions regarding the COVID-19 Pandemic (e.g. social distancing).  This format is felt to be most appropriate for this patient at this time balancing risks to patient and risks to population by having him in for in person visit.  No physical exam was performed (except for noted visual exam or audio findings with Telehealth visits).    Our team/I connected with Brian Graham at  3:40 PM EDT by a video enabled telemedicine application ([email or web address]) and verified that I am speaking with the correct person using two identifiers.  Location patient: [place] Location provider: [facility], office Persons participating in the virtual visit:  patient  Our team/I discussed the limitations of evaluation and management by telemedicine and the availability of in person appointments. In light of current covid-19 pandemic, patient also understands that we are trying to protect them by minimizing in office contact if at all possible.  The patient expressed consent for telemedicine visit and agreed to proceed. Patient understands insurance will be billed.   ROS- no fever/chills/nausea/vomiting. No flank pain- other than some pain over bruised area  Past Medical History-  Patient Active Problem List   Diagnosis Date Noted  . MGUS (monoclonal gammopathy of unknown significance) 08/02/2018    Priority: High  . Osteoporosis 06/26/2018    Priority: High  . Atherosclerosis of native arteries of extremity with intermittent claudication ([place]) 07/06/2017    Priority: High  . Weight loss 04/23/2017    Priority: High  . Former smoker 09/01/2016    Priority: High  . COPD, severe ([place]) 08/23/2016    Priority: High  . Hx of CABG 08/05/2016    Priority: High  . CAD (coronary artery disease) of artery  bypass graft 08/05/2016    Priority: High  . 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 History of adenomatous polyp of colon 04/22/2017    Priority: Medium  . Hyperlipidemia 04/04/2017    Priority: Medium  . Hypothyroidism (acquired) 04/04/2017    Priority: Medium  . Essential hypertension 08/05/2016    Priority: Medium  . Essential tremor 06/11/2016    Priority: Medium  . Aortic atherosclerosis ([place]) 04/13/2017    Priority: Low  . Seasonal allergies 04/04/2017    Priority: Low  . Myalgia 10/04/2016    Priority: Low  . Dysuria 08/23/2016    Priority: Low  . Chronic pain of both knees 08/23/2016    Priority: Low  . Tension headache 06/11/2016    Priority: Low  . S/p nephrectomy 12/05/2018  . Gastroenteritis, acute 12/05/2018  . AKI (acute kidney injury) ([place]) 12/04/2018  . Perennial allergic rhinitis 01/24/2018  . 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 Urinary tract infection 09/03/2017  . Constipation 09/03/2017  . Anxiety 07/24/2017  . Pyelonephritis 04/23/2017    Medications- reviewed and updated Current Outpatient Medications  Medication Sig Dispense Refill  . acetaminophen (TYLENOL) 500 MG tablet Take 500-1,000 mg by mouth every 6 (six) hours as needed (for pain).     Marland Kitchen albuterol (PROVENTIL) (2.5 MG/3ML) 0.083% nebulizer solution Take 3 mLs (2.5 mg total) by nebulization every 6 (six) hours as needed for wheezing or shortness of breath. J44.9 300 mL 11  . Albuterol Sulfate (PROAIR RESPICLICK) 211 (90 Base) MCG/ACT AEPB Inhale 2 puffs into the lungs every 4 (four)  hours as needed. 3 each 1  . alendronate (FOSAMAX) 70 MG tablet Take 1 tablet (70 mg total) by mouth every 7 (seven) days. Take with a full glass of water on an empty stomach. (Patient taking differently: Take 70 mg by mouth every Sunday. Take with a full glass of water on an empty stomach.) 13 tablet 3  . amLODipine (NORVASC) 5 MG tablet Take 1 tablet (5 mg total) by mouth daily. 90 tablet 3  .  aspirin EC 81 MG tablet Take 81 mg by mouth daily.    Marland Kitchen atenolol (TENORMIN) 50 MG tablet Take 1 tablet (50 mg total) by mouth 2 (two) times daily. 180 tablet 3  . budesonide-formoterol (SYMBICORT) 160-4.5 MCG/ACT inhaler USE 2 INHALATIONS TWICE A DAY (Patient taking differently: Inhale 2 puffs into the lungs 2 (two) times daily. ) 3 Inhaler 3  . Misc. Devices (PULSE OXIMETER) MISC 1 application by Does not apply route as needed. 1 each 0  . ondansetron (ZOFRAN-ODT) 4 MG disintegrating tablet PLACE 1 TO 2 TABLETS ON TONGUE AND ALLOW TO DISSOLVE EVERY 8 HOURS AS NEEDED FOR NAUSEA / VOMITING 30 tablet 0  . rosuvastatin (CRESTOR) 40 MG tablet Take 1 tablet (40 mg total) by mouth daily. (Patient taking differently: Take 40 mg by mouth at bedtime. ) 90 tablet 3  . SPIRIVA RESPIMAT 2.5 MCG/ACT AERS USE 2 INHALATIONS DAILY (Patient taking differently: Inhale 1 puff into the lungs 2 (two) times daily. ) 12 g 3  . SYNTHROID 125 MCG tablet TAKE 1 TABLET DAILY BEFORE BREAKFAST 90 tablet 3  . traMADol (ULTRAM) 50 MG tablet Take 1 tablet (50 mg total) by mouth every 6 (six) hours as needed for moderate pain or severe pain. 30 tablet 5  . SYNTHROID 112 MCG tablet Take 1 tablet (112 mcg total) by mouth daily before breakfast. 90 tablet 3   No current facility-administered medications for this visit.      Objective:  BP 128/71 (BP Location: Left Arm, Patient Position: Sitting, Cuff Size: Normal)   Pulse 68   Temp (!) 96.5 F (35.8 C) (Oral)   Ht 5\' 9"  (1.753 m)   SpO2 96%   BMI 22.59 kg/m  Gen: NAD, resting comfortably Lungs: nonlabored, normal respiratory rate  Skin: appears dry, no obvious rash, bruising over left hip in at least 10 x 10 cm area. Smaller bruising in left axilla. Also has some slight bruising in left groin     Assessment and Plan   # Bruising/baseline senile purpura S: patient has noted some bruising on lower left back, upper left back and lower left groin started last Wednesday but  has worsened with time. That morning- Picked up 40 lb bag of dirt and felt some pain along left side right after he did. States didn't lift with his legs.   Also scratched right arm and right arm bruised very easiy.   had some mild pain in back this mroning. Wife thought it looked a little swollen  He is on an anspirin but no NOAC  No claudication. Patient was worried could be related to prior iliac stent A/P: I suspect patient had a muscular strain at the time of his heavy lifting and potential bruising related to that- given his senile purpura (always bruises on wrist and currently with large bruise on right wrist) much lower threshold for bruising in first place. He was most worried about stent malfunction. No claudication and I think that is less likely the cause- woudlnt expect  bruising up into axilla.  - does have MGUS- if has worsening issues or more extensive bruising that is not improving- then would need to consider bloodwork to check on platelets  Hypothyroidism (acquired) S: Patient was supposed to have reduction in synthroid to 112 mcg (see last result note). Does not appear staff sent this in.  A/P: will reduce to 112 mcg and encouraged patient to schedule 6 week recheck TSH to make sure in normal range.    Future Appointments  Date Time Provider [place]  01/24/2020 11:00 AM [facility] 3 [facility] None  01/31/2020 10:00 AM Nicholas Lose, MD [facility] None   Lab/Order associations: Bruising  Senile purpura ([place])  Hypothyroidism (acquired)  Meds ordered this encounter  Medications  . SYNTHROID 112 MCG tablet    Sig: Take 1 tablet (112 mcg total) by mouth daily before breakfast.    Dispense:  90 tablet    Refill:  3    Return precautions advised.  Garret Reddish, MD

## 2019-03-26 NOTE — Telephone Encounter (Signed)
.  bt

## 2019-03-26 NOTE — Telephone Encounter (Signed)
I spoke with pt and he agreed to have a evisit with Dr. [name] today.

## 2019-03-26 NOTE — Patient Instructions (Addendum)
Health Maintenance Due  Topic Date Due  . Hepatitis C Screening Will discuss with Dr. [name] during Evisit. 1951-01-06    Depression screen [facility] 2/9 11/28/2018 09/03/2017 08/23/2016  Decreased Interest 0 0 0  Down, Depressed, Hopeless 0 0 0  PHQ - 2 Score 0 0 0   Video visit

## 2019-03-27 NOTE — Assessment & Plan Note (Signed)
S: Patient was supposed to have reduction in synthroid to 112 mcg (see last result note). Does not appear staff sent this in.  A/P: will reduce to 112 mcg and encouraged patient to schedule 6 week recheck TSH to make sure in normal range.

## 2019-04-13 DIAGNOSIS — J441 Chronic obstructive pulmonary disease with (acute) exacerbation: Secondary | ICD-10-CM | POA: Diagnosis not present

## 2019-04-30 ENCOUNTER — Encounter: Payer: Self-pay | Admitting: Family Medicine

## 2019-04-30 MED ORDER — TRAMADOL HCL 50 MG PO TABS: 50.0000 mg | ORAL_TABLET | Freq: Four times a day (QID) | ORAL | 5 refills | Status: DC | PRN

## 2019-04-30 NOTE — Telephone Encounter (Signed)
Last OV 03/26/19 Last refill 11/07/18 #30/5 Next OV not scheduled  Forwarding to Dr. [name]

## 2019-05-03 ENCOUNTER — Telehealth: Payer: Self-pay | Admitting: Pulmonary Disease

## 2019-05-03 MED ORDER — PROAIR RESPICLICK 108 (90 BASE) MCG/ACT IN AEPB: 2.0000 | INHALATION_SPRAY | RESPIRATORY_TRACT | 1 refills | Status: DC | PRN

## 2019-05-03 NOTE — Telephone Encounter (Signed)
lmtcb x1 pt 

## 2019-05-03 NOTE — Telephone Encounter (Signed)
Called patient Scheduled f/u 6/18 with SG Rx refill sent Nothing further needed.

## 2019-05-03 NOTE — Telephone Encounter (Signed)
Pt is calling back [phone]

## 2019-05-10 ENCOUNTER — Other Ambulatory Visit: Payer: Self-pay

## 2019-05-10 ENCOUNTER — Ambulatory Visit: Payer: Medicare HMO | Admitting: Acute Care

## 2019-05-10 ENCOUNTER — Encounter: Payer: Self-pay | Admitting: Acute Care

## 2019-05-10 DIAGNOSIS — J449 Chronic obstructive pulmonary disease, unspecified: Secondary | ICD-10-CM

## 2019-05-10 DIAGNOSIS — J3089 Other allergic rhinitis: Secondary | ICD-10-CM

## 2019-05-10 NOTE — Patient Instructions (Addendum)
It is good to see you today Continue Symbicort and Spiriva as you have been doing. Symbicort 2 puffs twice daily Rinse after use Spiriva 2 puffs once daily Use rescue inhaler 2 puffs as needed for break though shortness of breath Continue Zytrec daily as you have been doing. Add Flonase for any increase in allergic rhinitis as needed ( Fluticasone is the generic which is fine) Note your daily symptoms > remember "red flags" for COPD:  Increase in cough, increase in sputum production, increase in shortness of breath or activity intolerance. If you notice these symptoms, please call to be seen.   Continue to be safe, wear your mask when you venture away from home, wash your hands frequently, try to avoid touching your face. Let us know if you need anything at all.

## 2019-05-10 NOTE — Assessment & Plan Note (Signed)
Managed well with maintenance medications Plan Continue Zytrec daily as you have been doing. Add Flonase for any increase in allergic rhinitis as needed ( Fluticasone is the generic which is fine)  Let us know if you need anything at all.

## 2019-05-10 NOTE — Assessment & Plan Note (Addendum)
Stable Interval Plan Continue Symbicort and Spiriva as you have been doing. Symbicort 2 puffs twice daily Rinse after use Spiriva 2 puffs once daily Use rescue inhaler 2 puffs as needed for break though shortness of breath Follow up in 1 year with either Dr. [place] Bells or Judson Roch NP  Note your daily symptoms > remember "red flags" for COPD:  Increase in cough, increase in sputum production, increase in shortness of breath or activity intolerance. If you notice these symptoms, please call to be seen.   Continue to be safe, wear your mask when you venture away from home, wash your hands frequently, try to avoid touching your face. Let us know if you need anything at all.

## 2019-05-10 NOTE — Progress Notes (Signed)
History of Present Illness Brian Graham is a 68 y.o. male former  with COPD. He is followed by Dr. Lake Graham.   Synopsis: First referred in 2017 for severe COPD after being followed by the pulmonary group in [place] before moving to [place]. 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   05/10/2019 Annual Visit Pt. Presents for annual follow up. He states he has had  a stable interval. He is compliant with his Symbicort and Spiriva. He has not been coughing or producing mucus. No wheezing.  He states he did use his rescue inhaler last month, but he feels this is due to anxiety cause by having to wear a mask in the [place] environment. He states he is getting used to wearing the mask. He has not had to use it at all this month. He has had no flares. He states his allergies have been pretty good. He takes Zyrtec daily.He denies fever, chest pain, orthopnea or hemoptysis.  Test Results: Labs: Alpha 1 studies normal per prior pulmonary group notes 2014 M-M January 2019 serum IgE elevated at 147 May 2019 serum IgE normal 111  Chest imaging: Images from May 2018 CT chest independently reviewed showing moderate to severe emphysema, airway thickening but no mass or lesion August 2018 chest x-ray images independently reviewed showing emphysema, no mass, no infiltrate  PFT: June 2016 PFT from prior pulmonary group: "significant obstruction" FEV1 1.58L (47% pred), Residual volume 171% pred, DLCO 59% pred Simple Spirometry>> 09/15/2016 ratio 42% FEV1 1.02 L / 31%  Microbiology: January 2019 sputum culture AFB negative, fungus negative, bacteria negative  Records from his visit with primary care earlier this year reviewed where he was treated with Fosamax for osteoporosis.  There is also note about evaluating his immunoglobulins with a kappa lambda light chain  analysis, he later saw Dr. 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    CBC Latest Ref Rng & Units 01/24/2019 12/05/2018 12/04/2018  WBC 4.0 - 10.5 K/uL 8.7 7.6 8.8  Hemoglobin 13.0 - 17.0 g/dL 13.1 13.4 13.9  Hematocrit 39.0 - 52.0 % 40.8 41.2 43.5  Platelets 150 - 400 K/uL 260 204 255    BMP Latest Ref Rng & Units 03/01/2019 02/07/2019 01/24/2019  Glucose 70 - 99 mg/dL 126(H) 86 97  BUN 6 - 23 mg/dL 18 25(H) 25(H)  Creatinine 0.40 - 1.50 mg/dL 1.67(H) 1.88(H) 1.52(H)  BUN/Creat Ratio 10 - 24 - - -  Sodium 135 - 145 mEq/L 139 138 139  Potassium 3.5 - 5.1 mEq/L 4.4 4.5 4.4  Chloride 96 - 112 mEq/L 100 100 103  CO2 19 - 32 mEq/L 31 32 29  Calcium 8.4 - 10.5 mg/dL 9.3 9.4 8.9    BNP    Component Value Date/Time   BNP 209.6 (H) 07/10/2017 2310    ProBNP No results found for: PROBNP  PFT No results found for: FEV1PRE, FEV1POST, FVCPRE, FVCPOST, TLC, DLCOUNC, PREFEV1FVCRT, PSTFEV1FVCRT  No results found.   Past medical hx Past Medical History:  Diagnosis Date  . Anxiety   . Arthritis    "left knee" (08/05/2016)  . Bruises easily   . CAD (coronary artery disease) of artery bypass graft 08/05/2016   Around age 26. CABG - 3 vessel.   . Chronic kidney disease   . Colon polyp   . COPD, severe ([place]) 08/23/2016  Alpha 1 studies normal per prior pulmonary group notes Smoked 40 years, quit in 2012 June 2016 PFT from prior pulmonary group: "significant obstruction" FEV1 1.58L (47% pred), Residual volume 171% pred, DLCO 59% pred Simple Spirometry>> 09/15/2016 ratio 42% FEV1 1.02 L / 31%   . Essential tremor 06/11/2016   Plans to see Dr. Carles Graham  . Former smoker 09/01/2016   Quit 2012. 40 pack years at least.   . GERD (gastroesophageal reflux disease)   . Headache   . History of blood transfusion 08/1997   "when he had his heart surgery"  . History of shingles 1970-2013 X 3  . HTN (hypertension) 08/05/2016   Amlodipine 5mg , atenolol 50mg  BID, benazepril  20mg   . Hyperlipemia   . Hypothyroidism   . Lumbar disc disease   . Myocardial infarction ([place]) 08/1997  . Peripheral vascular disease ([place])   . PONV (postoperative nausea and vomiting)   . Urinary tract infection 09/03/2017  . Wears glasses      Social History   Tobacco Use  . Smoking status: Former Smoker    Packs/day: 1.00    Years: 45.00    Pack years: 45.00    Types: Cigarettes    Quit date: 07/10/2017    Years since quitting: 1.8  . Smokeless tobacco: Never Used  Substance Use Topics  . Alcohol use: Yes    Alcohol/week: 14.0 standard drinks    Types: 14 Shots of liquor per week    Comment: daily 2 shots of vodka  . Drug use: No    BrianGraham reports that he quit smoking about 22 months ago. His smoking use included cigarettes. He has a 45.00 pack-year smoking history. He has never used smokeless tobacco. He reports current alcohol use of about 14.0 standard drinks of alcohol per week. He reports that he does not use drugs.  Tobacco Cessation: Former smoker with a 45 pack year smoking history.Quit 2018  Past surgical hx, Family hx, Social hx all reviewed.  Current Outpatient Medications on File Prior to Visit  Medication Sig  . acetaminophen (TYLENOL) 500 MG tablet Take 500-1,000 mg by mouth every 6 (six) hours as needed (for pain).   Marland Kitchen albuterol (PROVENTIL) (2.5 MG/3ML) 0.083% nebulizer solution Take 3 mLs (2.5 mg total) by nebulization every 6 (six) hours as needed for wheezing or shortness of breath. J44.9  . Albuterol Sulfate (PROAIR RESPICLICK) 716 (90 Base) MCG/ACT AEPB Inhale 2 puffs into the lungs every 4 (four) hours as needed.  Marland Kitchen alendronate (FOSAMAX) 70 MG tablet Take 1 tablet (70 mg total) by mouth every 7 (seven) days. Take with a full glass of water on an empty stomach. (Patient taking differently: Take 70 mg by mouth every Sunday. Take with a full glass of water on an empty stomach.)  . amLODipine (NORVASC) 5 MG tablet Take 1 tablet (5 mg total) by mouth  daily.  Marland Kitchen aspirin EC 81 MG tablet Take 81 mg by mouth daily.  Marland Kitchen atenolol (TENORMIN) 50 MG tablet Take 1 tablet (50 mg total) by mouth 2 (two) times daily.  . budesonide-formoterol (SYMBICORT) 160-4.5 MCG/ACT inhaler USE 2 INHALATIONS TWICE A DAY (Patient taking differently: Inhale 2 puffs into the lungs 2 (two) times daily. )  . Misc. Devices (PULSE OXIMETER) MISC 1 application by Does not apply route as needed.  . ondansetron (ZOFRAN-ODT) 4 MG disintegrating tablet PLACE 1 TO 2 TABLETS ON TONGUE AND ALLOW TO DISSOLVE EVERY 8 HOURS AS NEEDED FOR NAUSEA / VOMITING  . rosuvastatin (  CRESTOR) 40 MG tablet Take 1 tablet (40 mg total) by mouth daily. (Patient taking differently: Take 40 mg by mouth at bedtime. )  . SPIRIVA RESPIMAT 2.5 MCG/ACT AERS USE 2 INHALATIONS DAILY (Patient taking differently: Inhale 1 puff into the lungs 2 (two) times daily. )  . SYNTHROID 112 MCG tablet Take 1 tablet (112 mcg total) by mouth daily before breakfast.  . SYNTHROID 125 MCG tablet TAKE 1 TABLET DAILY BEFORE BREAKFAST  . traMADol (ULTRAM) 50 MG tablet Take 1 tablet (50 mg total) by mouth every 6 (six) hours as needed for moderate pain or severe pain.   No current facility-administered medications on file prior to visit.      Allergies  Allergen Reactions  . Other Itching and Rash    Gel used for ultrasound SEVERELY broke out the skin   . Keflex [Cephalexin] Diarrhea, Nausea Only and Other (See Comments)    Insomnia  . Sulfa Antibiotics Other (See Comments)    Insomnia  . Ciprofloxacin Diarrhea  . Levaquin [Levofloxacin] Other (See Comments)    Insomnia   . Omeprazole Diarrhea    Review Of Systems:  Constitutional:   No  weight loss, night sweats,  Fevers, chills, fatigue, or  lassitude.  HEENT:   No headaches,  Difficulty swallowing,  Tooth/dental problems, or  Sore throat,                No sneezing, itching, ear ache, occasional nasal congestion, occasional post nasal drip,   CV:  No chest pain,   Orthopnea, PND, swelling in lower extremities, anasarca, dizziness, palpitations, syncope.   GI  No heartburn, indigestion, abdominal pain, nausea, vomiting, diarrhea, change in bowel habits, loss of appetite, bloody stools.   Resp: No shortness of breath with exertion or at rest.  No excess mucus, no productive cough,  No non-productive cough,  No coughing up of blood.  No change in color of mucus.  No wheezing.  No chest wall deformity  Skin: no rash or lesions.  GU: no dysuria, change in color of urine, no urgency or frequency.  No flank pain, no hematuria   MS:  No joint pain or swelling.  No decreased range of motion.  No back pain.  Psych:  No change in mood or affect. No depression or anxiety.  No memory loss.   Vital Signs BP 130/70 (BP Location: Left Arm, Cuff Size: Normal)   Pulse 61   Temp 98.3 F (36.8 C) (Oral)   Ht 5\' 9"  (1.753 m)   Wt 148 lb 12.8 oz (67.5 kg)   SpO2 96%   BMI 21.97 kg/m    Physical Exam:  General- No distress,  A&Ox3, pleasant ENT: No sinus tenderness, TM clear, pale nasal mucosa, no oral exudate,no post nasal drip, no LAN Cardiac: S1, S2, regular rate and rhythm, no murmur Chest: No wheeze/ rales/ dullness; no accessory muscle use, no nasal flaring, no sternal retractions Abd.: Soft Non-tender, ND, BS +, Body mass index is 21.97 kg/m. Ext: No clubbing cyanosis, edema, no obvious deformities Neuro:  normal strength, MAE x 4, A&O x 3, appropriate Skin: No rashes, warm and dry Psych: normal mood and behavior   Assessment/Plan  COPD, severe (HCC) Stable Interval Plan Continue Symbicort and Spiriva as you have been doing. Symbicort 2 puffs twice daily Rinse after use Spiriva 2 puffs once daily Use rescue inhaler 2 puffs as needed for break though shortness of breath Follow up in 1 year with either Dr. Lake Graham  or Sarah NP  Note your daily symptoms > remember "red flags" for COPD:  Increase in cough, increase in sputum production,  increase in shortness of breath or activity intolerance. If you notice these symptoms, please call to be seen.   Continue to be safe, wear your mask when you venture away from home, wash your hands frequently, try to avoid touching your face. Let us know if you need anything at all.    Perennial allergic rhinitis Managed well with maintenance medications Plan Continue Zytrec daily as you have been doing. Add Flonase for any increase in allergic rhinitis as needed ( Fluticasone is the generic which is fine)  Let us know if you need anything at all.      Magdalen Spatz, NP 05/10/2019  11:18 AM

## 2019-05-11 ENCOUNTER — Other Ambulatory Visit (INDEPENDENT_AMBULATORY_CARE_PROVIDER_SITE_OTHER): Payer: Medicare HMO

## 2019-05-11 DIAGNOSIS — R7989 Other specified abnormal findings of blood chemistry: Secondary | ICD-10-CM

## 2019-05-11 DIAGNOSIS — E039 Hypothyroidism, unspecified: Secondary | ICD-10-CM | POA: Diagnosis not present

## 2019-05-11 LAB — BASIC METABOLIC PANEL
BUN: 27 mg/dL — ABNORMAL HIGH (ref 6–23)
CO2: 28 mEq/L (ref 19–32)
Calcium: 8.9 mg/dL (ref 8.4–10.5)
Chloride: 103 mEq/L (ref 96–112)
Creatinine, Ser: 2.08 mg/dL — ABNORMAL HIGH (ref 0.40–1.50)
GFR: 31.83 mL/min — ABNORMAL LOW (ref >60.00)
Glucose, Bld: 88 mg/dL (ref 70–99)
Potassium: 4.5 mEq/L (ref 3.5–5.1)
Sodium: 140 mEq/L (ref 135–145)

## 2019-05-11 LAB — TSH: TSH: 0.77 u[IU]/mL (ref 0.35–4.50)

## 2019-05-14 DIAGNOSIS — J441 Chronic obstructive pulmonary disease with (acute) exacerbation: Secondary | ICD-10-CM | POA: Diagnosis not present

## 2019-05-15 ENCOUNTER — Other Ambulatory Visit: Payer: Self-pay | Admitting: *Deleted

## 2019-05-15 DIAGNOSIS — Z122 Encounter for screening for malignant neoplasm of respiratory organs: Secondary | ICD-10-CM

## 2019-05-15 DIAGNOSIS — F17210 Nicotine dependence, cigarettes, uncomplicated: Secondary | ICD-10-CM

## 2019-05-15 DIAGNOSIS — Z87891 Personal history of nicotine dependence: Secondary | ICD-10-CM

## 2019-05-16 ENCOUNTER — Other Ambulatory Visit: Payer: Self-pay

## 2019-05-16 DIAGNOSIS — R7989 Other specified abnormal findings of blood chemistry: Secondary | ICD-10-CM

## 2019-05-19 ENCOUNTER — Other Ambulatory Visit: Payer: Self-pay | Admitting: Pulmonary Disease

## 2019-05-21 MED ORDER — SPIRIVA RESPIMAT 2.5 MCG/ACT IN AERS: INHALATION_SPRAY | RESPIRATORY_TRACT | 3 refills | Status: DC

## 2019-06-04 ENCOUNTER — Other Ambulatory Visit: Payer: Self-pay | Admitting: Family Medicine

## 2019-06-05 ENCOUNTER — Other Ambulatory Visit: Payer: Self-pay

## 2019-06-05 ENCOUNTER — Other Ambulatory Visit (INDEPENDENT_AMBULATORY_CARE_PROVIDER_SITE_OTHER): Payer: Medicare HMO

## 2019-06-05 DIAGNOSIS — R7989 Other specified abnormal findings of blood chemistry: Secondary | ICD-10-CM | POA: Diagnosis not present

## 2019-06-05 LAB — BASIC METABOLIC PANEL
BUN: 17 mg/dL (ref 6–23)
CO2: 31 mEq/L (ref 19–32)
Calcium: 8.7 mg/dL (ref 8.4–10.5)
Chloride: 104 mEq/L (ref 96–112)
Creatinine, Ser: 1.7 mg/dL — ABNORMAL HIGH (ref 0.40–1.50)
GFR: 40.17 mL/min — ABNORMAL LOW (ref >60.00)
Glucose, Bld: 102 mg/dL — ABNORMAL HIGH (ref 70–99)
Potassium: 4.4 mEq/L (ref 3.5–5.1)
Sodium: 142 mEq/L (ref 135–145)

## 2019-06-13 DIAGNOSIS — J441 Chronic obstructive pulmonary disease with (acute) exacerbation: Secondary | ICD-10-CM | POA: Diagnosis not present

## 2019-06-28 ENCOUNTER — Telehealth: Payer: Self-pay | Admitting: Acute Care

## 2019-06-28 ENCOUNTER — Other Ambulatory Visit: Payer: Self-pay

## 2019-06-28 ENCOUNTER — Ambulatory Visit (INDEPENDENT_AMBULATORY_CARE_PROVIDER_SITE_OTHER)
Admission: RE | Admit: 2019-06-28 | Discharge: 2019-06-28 | Disposition: A | Discharge status: Home / Self Care | Payer: Medicare HMO | Source: Ambulatory Visit | Attending: Acute Care | Admitting: Acute Care

## 2019-06-28 DIAGNOSIS — Z87891 Personal history of nicotine dependence: Secondary | ICD-10-CM

## 2019-06-28 DIAGNOSIS — R69 Illness, unspecified: Secondary | ICD-10-CM | POA: Diagnosis not present

## 2019-06-28 DIAGNOSIS — F17210 Nicotine dependence, cigarettes, uncomplicated: Secondary | ICD-10-CM | POA: Diagnosis not present

## 2019-06-28 DIAGNOSIS — Z122 Encounter for screening for malignant neoplasm of respiratory organs: Secondary | ICD-10-CM

## 2019-06-28 NOTE — Telephone Encounter (Signed)
Spoke with Marzetta Board at [facility] had CT Chest today and had concerning results, impression follows:  IMPRESSION: 1. Lung-RADS 4A, suspicious. Follow up low-dose chest CT without contrast in 3 months (please use the following order, "CT CHEST LCS NODULE FOLLOW-UP W/O CM") is recommended. Alternatively, PET may be considered when there is a solid component 29mm or larger. 2. Aortic atherosclerosis, in addition to left main and 3 vessel coronary artery disease. Status post median sternotomy for CABG including LIMA to the LAD. 3. Mild diffuse bronchial wall thickening with moderate centrilobular and paraseptal emphysema; imaging findings suggestive of underlying COPD.  These results will be called to the ordering clinician or representative by the Radiologist Assistant, and communication documented in the PACS or zVision Dashboard.  Aortic Atherosclerosis (ICD10-I70.0) and Emphysema (ICD10-J43.9).   Electronically Signed   By: Vinnie Langton M.D.   On: 06/28/2019 13:14  Will forward to APP of the day since Judson Roch not in the office, thanks

## 2019-06-28 NOTE — Telephone Encounter (Signed)
Part of LDCT program   Brian Graham, will leave this to you to follow up on as nodules are <8 mm .  Can decide if PET or CT chest in 3 months

## 2019-06-29 ENCOUNTER — Other Ambulatory Visit: Payer: Self-pay | Admitting: *Deleted

## 2019-06-29 DIAGNOSIS — Z87891 Personal history of nicotine dependence: Secondary | ICD-10-CM

## 2019-06-29 DIAGNOSIS — Z122 Encounter for screening for malignant neoplasm of respiratory organs: Secondary | ICD-10-CM

## 2019-06-29 DIAGNOSIS — F17210 Nicotine dependence, cigarettes, uncomplicated: Secondary | ICD-10-CM

## 2019-07-02 NOTE — Telephone Encounter (Signed)
Thanks Tammy. I called him Friday, and we are doing a 3 month follow up scan. I appreciate your help, as always!!

## 2019-07-14 DIAGNOSIS — J441 Chronic obstructive pulmonary disease with (acute) exacerbation: Secondary | ICD-10-CM | POA: Diagnosis not present

## 2019-07-17 ENCOUNTER — Encounter: Payer: Self-pay | Admitting: Family Medicine

## 2019-07-18 MED ORDER — ONDANSETRON 4 MG PO TBDP: ORAL_TABLET | ORAL | 0 refills | Status: DC

## 2019-07-18 NOTE — Telephone Encounter (Signed)
Last OV 03/26/19 Last refill 12/12/18 #30/0

## 2019-07-31 ENCOUNTER — Ambulatory Visit (INDEPENDENT_AMBULATORY_CARE_PROVIDER_SITE_OTHER): Payer: Medicare HMO

## 2019-07-31 ENCOUNTER — Other Ambulatory Visit: Payer: Self-pay

## 2019-07-31 DIAGNOSIS — Z23 Encounter for immunization: Secondary | ICD-10-CM | POA: Diagnosis not present

## 2019-08-07 ENCOUNTER — Encounter: Payer: Self-pay | Admitting: Family Medicine

## 2019-08-07 ENCOUNTER — Other Ambulatory Visit: Payer: Self-pay | Admitting: Family Medicine

## 2019-08-08 NOTE — Telephone Encounter (Signed)
Called pt and confirmed that he is taking Atenolol 50 mg once daily. Rx refilled.

## 2019-08-09 ENCOUNTER — Encounter: Payer: Self-pay | Admitting: Family Medicine

## 2019-08-12 ENCOUNTER — Other Ambulatory Visit: Payer: Self-pay | Admitting: Family Medicine

## 2019-08-13 ENCOUNTER — Ambulatory Visit: Payer: Medicare HMO | Admitting: Family Medicine

## 2019-08-13 ENCOUNTER — Other Ambulatory Visit: Payer: Self-pay

## 2019-08-13 ENCOUNTER — Encounter: Payer: Self-pay | Admitting: Family Medicine

## 2019-08-13 VITALS — BP 120/70 | HR 68 | Temp 98.6°F | Wt 147.6 lb

## 2019-08-13 DIAGNOSIS — E785 Hyperlipidemia, unspecified: Secondary | ICD-10-CM | POA: Diagnosis not present

## 2019-08-13 DIAGNOSIS — Z1159 Encounter for screening for other viral diseases: Secondary | ICD-10-CM | POA: Diagnosis not present

## 2019-08-13 DIAGNOSIS — R5383 Other fatigue: Secondary | ICD-10-CM

## 2019-08-13 DIAGNOSIS — N189 Chronic kidney disease, unspecified: Secondary | ICD-10-CM | POA: Insufficient documentation

## 2019-08-13 DIAGNOSIS — I10 Essential (primary) hypertension: Secondary | ICD-10-CM

## 2019-08-13 DIAGNOSIS — M810 Age-related osteoporosis without current pathological fracture: Secondary | ICD-10-CM | POA: Diagnosis not present

## 2019-08-13 DIAGNOSIS — R1110 Vomiting, unspecified: Secondary | ICD-10-CM

## 2019-08-13 DIAGNOSIS — N183 Chronic kidney disease, stage 3 unspecified: Secondary | ICD-10-CM

## 2019-08-13 DIAGNOSIS — D72829 Elevated white blood cell count, unspecified: Secondary | ICD-10-CM | POA: Diagnosis not present

## 2019-08-13 DIAGNOSIS — N184 Chronic kidney disease, stage 4 (severe): Secondary | ICD-10-CM | POA: Insufficient documentation

## 2019-08-13 DIAGNOSIS — E039 Hypothyroidism, unspecified: Secondary | ICD-10-CM | POA: Diagnosis not present

## 2019-08-13 DIAGNOSIS — R944 Abnormal results of kidney function studies: Secondary | ICD-10-CM

## 2019-08-13 LAB — COMPREHENSIVE METABOLIC PANEL
ALT: 18 U/L (ref 0–53)
AST: 20 U/L (ref 0–37)
Albumin: 4.3 g/dL (ref 3.5–5.2)
Alkaline Phosphatase: 62 U/L (ref 39–117)
BUN: 22 mg/dL (ref 6–23)
CO2: 27 mEq/L (ref 19–32)
Calcium: 9.5 mg/dL (ref 8.4–10.5)
Chloride: 105 mEq/L (ref 96–112)
Creatinine, Ser: 2.44 mg/dL — ABNORMAL HIGH (ref 0.40–1.50)
GFR: 26.46 mL/min — ABNORMAL LOW (ref >60.00)
Glucose, Bld: 98 mg/dL (ref 70–99)
Potassium: 4.7 mEq/L (ref 3.5–5.1)
Sodium: 142 mEq/L (ref 135–145)
Total Bilirubin: 0.7 mg/dL (ref 0.2–1.2)
Total Protein: 7 g/dL (ref 6.0–8.3)

## 2019-08-13 LAB — CBC WITH DIFFERENTIAL/PLATELET
Basophils Absolute: 0.1 10*3/uL (ref 0.0–0.1)
Basophils Relative: 0.7 % (ref 0.0–3.0)
Eosinophils Absolute: 0.5 10*3/uL (ref 0.0–0.7)
Eosinophils Relative: 4.8 % (ref 0.0–5.0)
HCT: 39.7 % (ref 39.0–52.0)
Hemoglobin: 13 g/dL (ref 13.0–17.0)
Lymphocytes Relative: 15.2 % (ref 12.0–46.0)
Lymphs Abs: 1.7 10*3/uL (ref 0.7–4.0)
MCHC: 32.7 g/dL (ref 30.0–36.0)
MCV: 94.7 fl (ref 78.0–100.0)
Monocytes Absolute: 0.8 10*3/uL (ref 0.1–1.0)
Monocytes Relative: 7.4 % (ref 3.0–12.0)
Neutro Abs: 7.8 10*3/uL — ABNORMAL HIGH (ref 1.4–7.7)
Neutrophils Relative %: 71.9 % (ref 43.0–77.0)
Platelets: 272 10*3/uL (ref 150.0–400.0)
RBC: 4.19 Mil/uL — ABNORMAL LOW (ref 4.22–5.81)
RDW: 15.2 % (ref 11.5–15.5)
WBC: 10.9 10*3/uL — ABNORMAL HIGH (ref 4.0–10.5)

## 2019-08-13 LAB — LIPID PANEL
Cholesterol: 115 mg/dL (ref 0–200)
HDL: 54.1 mg/dL (ref >39.00)
LDL Cholesterol: 39 mg/dL (ref 0–99)
NonHDL: 61.01
Total CHOL/HDL Ratio: 2
Triglycerides: 110 mg/dL (ref 0.0–149.0)
VLDL: 22 mg/dL (ref 0.0–40.0)

## 2019-08-13 LAB — VITAMIN D 25 HYDROXY (VIT D DEFICIENCY, FRACTURES): VITD: 31.74 ng/mL (ref 30.00–100.00)

## 2019-08-13 LAB — TSH: TSH: 0.62 u[IU]/mL (ref 0.35–4.50)

## 2019-08-13 MED ORDER — ONDANSETRON 4 MG PO TBDP: ORAL_TABLET | ORAL | 1 refills | Status: DC

## 2019-08-13 MED ORDER — TRAMADOL HCL 50 MG PO TABS: 50.0000 mg | ORAL_TABLET | Freq: Two times a day (BID) | ORAL | 5 refills | Status: DC | PRN

## 2019-08-13 MED ORDER — ESOMEPRAZOLE MAGNESIUM 40 MG PO CPDR: 40.0000 mg | DELAYED_RELEASE_CAPSULE | Freq: Every day | ORAL | 5 refills | Status: DC

## 2019-08-13 NOTE — Patient Instructions (Addendum)
Lets try nexium daily before first meal of the day and do a recheck in 3-4 weeks- sooner if symptoms worsen  Consider adding miralax at next visit if making progress- we are also considering imaging depending on how things go  Please stop by lab before you go If you do not have mychart- we will call you about results within 5 business days of [place] receiving them.  If you have mychart- we will send your results within 3 business days of [place] receiving them.  If abnormal or we want to clarify a result, we will call or mychart you to make sure you receive the message.  If you have questions or concerns or don't hear within 5-7 days, please send [place] a message or call us.

## 2019-08-13 NOTE — Addendum Note (Signed)
Addended by: [name] on: 08/13/2019 08:58 PM   Modules accepted: Orders

## 2019-08-13 NOTE — Progress Notes (Signed)
Phone [phone]   Subjective:  Brian Graham is a 68 y.o. year old very pleasant male patient who presents for/with See problem oriented charting Chief Complaint  Patient presents with  . Follow-up  . Hypertension  . Hyperlipidemia  . Hypothyroidism  . COPD  . Osteoporosis   ROS- no fever/chills/diarrhea. No cough/congestion. Not coughing up blood. No hematesis. No vomiting. Has nausea and dry heaves.    Past Medical History-  Patient Active Problem List   Diagnosis Date Noted  . MGUS (monoclonal gammopathy of unknown significance) 08/02/2018    Priority: High  . Osteoporosis 06/26/2018    Priority: High  . Atherosclerosis of native arteries of extremity with intermittent claudication ([place]) 07/06/2017    Priority: High  . Weight loss 04/23/2017    Priority: High  . Former smoker 09/01/2016    Priority: High  . COPD, severe ([place]) 08/23/2016    Priority: High  . Hx of CABG 08/05/2016    Priority: High  . CAD (coronary artery disease) of artery bypass graft 08/05/2016    Priority: High  . 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 History of adenomatous polyp of colon 04/22/2017    Priority: Medium  . Hyperlipidemia 04/04/2017    Priority: Medium  . Hypothyroidism (acquired) 04/04/2017    Priority: Medium  . Essential hypertension 08/05/2016    Priority: Medium  . Essential tremor 06/11/2016    Priority: Medium  . Aortic atherosclerosis ([place]) 04/13/2017    Priority: Low  . Seasonal allergies 04/04/2017    Priority: Low  . Myalgia 10/04/2016    Priority: Low  . Dysuria 08/23/2016    Priority: Low  . Chronic pain of both knees 08/23/2016    Priority: Low  . Tension headache 06/11/2016    Priority: Low  . S/p nephrectomy 12/05/2018  . Gastroenteritis, acute 12/05/2018  . AKI (acute kidney injury) ([place]) 12/04/2018  . Perennial allergic rhinitis 01/24/2018  . 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 Urinary tract infection 09/03/2017  .  Constipation 09/03/2017  . Anxiety 07/24/2017  . Pyelonephritis 04/23/2017    Medications- reviewed and updated Current Outpatient Medications  Medication Sig Dispense Refill  . acetaminophen (TYLENOL) 500 MG tablet Take 500-1,000 mg by mouth every 6 (six) hours as needed (for pain).     Marland Kitchen albuterol (PROVENTIL) (2.5 MG/3ML) 0.083% nebulizer solution Take 3 mLs (2.5 mg total) by nebulization every 6 (six) hours as needed for wheezing or shortness of breath. J44.9 300 mL 11  . Albuterol Sulfate (PROAIR RESPICLICK) 616 (90 Base) MCG/ACT AEPB Inhale 2 puffs into the lungs every 4 (four) hours as needed. 3 each 1  . alendronate (FOSAMAX) 70 MG tablet Take 1 tablet (70 mg total) by mouth every 7 (seven) days. Take with a full glass of water on an empty stomach. (Patient taking differently: Take 70 mg by mouth every Sunday. Take with a full glass of water on an empty stomach.) 13 tablet 3  . amLODipine (NORVASC) 5 MG tablet Take 1 tablet (5 mg total) by mouth daily. 90 tablet 3  . aspirin EC 81 MG tablet Take 81 mg by mouth daily.    Marland Kitchen atenolol (TENORMIN) 50 MG tablet TAKE 1 TABLET DAILY 90 tablet 1  . budesonide-formoterol (SYMBICORT) 160-4.5 MCG/ACT inhaler USE 2 INHALATIONS TWICE A DAY (Patient taking differently: Inhale 2 puffs into the lungs 2 (two) times daily. ) 3 Inhaler 3  . Misc. Devices (PULSE OXIMETER) MISC 1 application by Does  not apply route as needed. 1 each 0  . ondansetron (ZOFRAN-ODT) 4 MG disintegrating tablet PLACE 1 TO 2 TABLETS ON TONGUE AND ALLOW TO DISSOLVE EVERY 8 HOURS AS NEEDED FOR NAUSEA / VOMITING 60 tablet 1  . rosuvastatin (CRESTOR) 40 MG tablet TAKE 1 TABLET DAILY 90 tablet 0  . SYNTHROID 112 MCG tablet Take 1 tablet (112 mcg total) by mouth daily before breakfast. 90 tablet 3  . SYNTHROID 125 MCG tablet TAKE 1 TABLET DAILY BEFORE BREAKFAST 90 tablet 3  . Tiotropium Bromide Monohydrate (SPIRIVA RESPIMAT) 2.5 MCG/ACT AERS USE 2 INHALATIONS DAILY 12 g 3  . traMADol  (ULTRAM) 50 MG tablet Take 1 tablet (50 mg total) by mouth 2 (two) times daily as needed for moderate pain or severe pain. 60 tablet 5  . esomeprazole (NEXIUM) 40 MG capsule Take 1 capsule (40 mg total) by mouth daily at 12 noon. 30 capsule 5   No current facility-administered medications for this visit.      Objective:  BP 120/70   Pulse 68   Temp 98.6 F (37 C)   Wt 147 lb 9.6 oz (67 kg)   SpO2 98%   BMI 21.80 kg/m  Gen: NAD, resting comfortably CV: RRR no murmurs rubs or gallops Lungs: CTAB no crackles, wheeze, rhonchi Abdomen: soft/nontender/nondistended/normal bowel sounds. No rebound or guarding.  Hernia above umbilicus noted-easily reducible Ext: no edema Skin: warm, dry     Assessment and Plan   #nausea/dry heaving and burping/fatigue S:Prior to 2012- a lot of dry heaving and burping. Went to GI at that time- colonoscopy came out well and then EGD was reassuring as well. 1st surgery got better after kidney surgery especially the dry heaves which can really be violent.   He states symptoms have returned and are very prominent.Last 6 months dry heaves have recurred and he has lost 7 lbs from last visit here he states. Also burping. One night seemed to be choking on his vomit and then recurred again. Had been on align years ago which did not help as far a she could tell. Taking ondansetron twice a day and that seems to help. Will get a pain below belly button abotu 7/10 that lasts for a few seconds- gets up and pain is better once dry heaving. Can range from not at all to 2x a day- better with ondansetron. Gets burning sensation with burps as well. Tums helps  Constipation since surgery 2 years ago on kidney. Takes [employer] brand stool softener.  Bowel movements perhaps once a week- very hard when he does go. Has never taken miralax. Has used dulcolax in past. Gets some hemorrhoid bleeding with this.   A lot of fatigue lately for last month. Flu shot 2 weeks ago. Knee started  bothering him a few days later- swelling some then better. Also had back aches after flu shot  Had lyme disease 40 years ago-he asks if this could be recurrence-no recent tick bites reported-discussed unlikely A/P: Nausea/dry heaves/fatigue/weight loss to be unknown etiology.   I am concerned patient could be experiencing reflux leading to nausea, burning in throat.  Also wonder if constipation could be contributing.  We will update labs to look for cause of fatigue.  We will trial Nexium-has had diarrhea on omeprazole in the past and may have to stop this.  Could try Pepcid.  Recommended 3-week follow-up.  He has follow-up with vascular within a few weeks- I doubt his issues related to PAD.  Consider MiraLAX for  constipation in the future.  Low threshold abdominal imaging or refer to GI -Refill ondansetron as has been very helpful for nausea  From AVS "  Patient Instructions  Lets try nexium daily before first meal of the day and do a recheck in 3-4 weeks- sooner if symptoms worsen  Consider adding miralax at next visit if making progress- we are also considering imaging depending on how things go "  # Hypertension S: compliant with amlodipine 5 mg, atenolol 50mg  BID. Restart 12/27/2018 of benazepril 20mg . PT states he is doing well on his bp meds and his bp is well controlled at home. A/P: Stable. Continue current medications.   #hyperlipidemia/CAD S:  controlled on Rosuvastatin 40 mg daily. Pt states he is experiencing leg and muscle cramps from his statin but he also understands the importance of statins.  No reported chest pain or shortness of breath Lab Results  Component Value Date   CHOL 167 05/22/2018   HDL 64.90 05/22/2018   LDLCALC 68 05/22/2018   LDLDIRECT 45.0 02/07/2019   TRIG 168.0 (H) 05/22/2018   CHOLHDL 3 05/22/2018   A/P: Lipids are stable.  No chest pain or increased shortness of breath for CAD.  CAD appears stable.  Continue current medications-try to update lipid  panel-added after visit so not sure if can be added on  #hypothyroidism S: On thyroid medication-Taking Synthroid 112 mcg daily. Pt states he is dong well on his synthroid. Lab Results  Component Value Date   TSH 0.77 05/11/2019   A/P: With fatigue issues-update TSH today.  Hopefully stable   # COPD S:Albuterol prn, Symbicort daily, and Spiriva daily.  Denies worsening breathing issues A/P:  Stable. Continue current medications.    # Osteoporosis S:Taking Fosamax 70 mg weekly-started last August.  Last bone density July 2019. A/P: Hopefully stable-continue current medication-we did discuss Fosamax could be increasing risk of reflux- he would like to continue medication at this time  #Tramadol-NCCSRS reviewed.  States very helpful for knee pain-has been taking twice a day.  We discussed could be contributing to constipation  Recommended follow up: 3-4 week follow-up recommended Future Appointments  Date Time Provider [place]  08/28/2019  9:00 AM [facility] 5 - AB [facility]  08/28/2019 10:00 AM [facility] 5 - AB [facility]  08/28/2019 11:20 AM Marty Heck, MD [facility]  09/20/2019  3:00 PM [name], MD [facility]  01/24/2020 11:00 AM [facility] 3 [facility] None  01/31/2020 10:00 AM Nicholas Lose, MD [facility] None    Lab/Order associations:   ICD-10-CM   1. Fatigue, unspecified type  R53.83 CBC with Differential/Platelet    Comprehensive metabolic panel    TSH    VITAMIN D 25 Hydroxy (Vit-D Deficiency, Fractures)  2. Essential hypertension  I10 CBC with Differential/Platelet    Comprehensive metabolic panel  3. Hypothyroidism (acquired)  E03.9 TSH  4. Age-related osteoporosis without current pathological fracture  M81.0 VITAMIN D 25 Hydroxy (Vit-D Deficiency, Fractures)  5. Encounter for hepatitis C screening test for low risk patient  Z11.59 Hepatitis C antibody  6. Hyperlipidemia, unspecified hyperlipidemia type  E78.5 Lipid panel     Meds ordered this encounter  Medications  . ondansetron (ZOFRAN-ODT) 4 MG disintegrating tablet    Sig: PLACE 1 TO 2 TABLETS ON TONGUE AND ALLOW TO DISSOLVE EVERY 8 HOURS AS NEEDED FOR NAUSEA / VOMITING    Dispense:  60 tablet    Refill:  1  . esomeprazole ([place])  40 MG capsule    Sig: Take 1 capsule (40 mg total) by mouth daily at 12 noon.    Dispense:  30 capsule    Refill:  5  . traMADol (ULTRAM) 50 MG tablet    Sig: Take 1 tablet (50 mg total) by mouth 2 (two) times daily as needed for moderate pain or severe pain.    Dispense:  60 tablet    Refill:  5    Return precautions advised.  Garret Reddish, MD

## 2019-08-14 DIAGNOSIS — J441 Chronic obstructive pulmonary disease with (acute) exacerbation: Secondary | ICD-10-CM | POA: Diagnosis not present

## 2019-08-14 LAB — HEPATITIS C ANTIBODY
Hepatitis C Ab: NONREACTIVE
SIGNAL TO CUT-OFF: 0.01 (ref <1.00)

## 2019-08-17 ENCOUNTER — Telehealth: Payer: Self-pay

## 2019-08-17 ENCOUNTER — Inpatient Hospital Stay: Admission: RE | Admit: 2019-08-17 | Payer: Medicare HMO | Source: Ambulatory Visit

## 2019-08-17 NOTE — Telephone Encounter (Signed)
Called pt wife an advised.

## 2019-08-17 NOTE — Telephone Encounter (Signed)
So a CT scan without contrast essentially means no IV contrast.  He is not getting IV contrast.  The oral contrast may help with imaging but should not damage kidneys-IV contrast may affect his kidneys

## 2019-08-17 NOTE — Telephone Encounter (Signed)
Called pt to discuss results, wife answered. Says results were reviewed via Mychart. He is scheduled for CT with oral contrast on 08/31/19. CT abd/pelv is ordered w/o contrast. Dr. [name], do you want/need to change order?

## 2019-08-28 ENCOUNTER — Other Ambulatory Visit: Payer: Self-pay | Admitting: *Deleted

## 2019-08-28 ENCOUNTER — Ambulatory Visit: Payer: Medicare HMO | Admitting: Vascular Surgery

## 2019-08-28 ENCOUNTER — Other Ambulatory Visit: Payer: Self-pay

## 2019-08-28 ENCOUNTER — Ambulatory Visit (HOSPITAL_COMMUNITY)
Admission: RE | Admit: 2019-08-28 | Discharge: 2019-08-28 | Disposition: A | Discharge status: Home / Self Care | Payer: Medicare HMO | Source: Ambulatory Visit | Attending: Vascular Surgery | Admitting: Vascular Surgery

## 2019-08-28 ENCOUNTER — Ambulatory Visit (INDEPENDENT_AMBULATORY_CARE_PROVIDER_SITE_OTHER)
Admission: RE | Admit: 2019-08-28 | Discharge: 2019-08-28 | Disposition: A | Discharge status: Home / Self Care | Payer: Medicare HMO | Source: Ambulatory Visit | Attending: Vascular Surgery | Admitting: Vascular Surgery

## 2019-08-28 DIAGNOSIS — I739 Peripheral vascular disease, unspecified: Secondary | ICD-10-CM | POA: Diagnosis not present

## 2019-08-28 DIAGNOSIS — I745 Embolism and thrombosis of iliac artery: Secondary | ICD-10-CM

## 2019-08-28 DIAGNOSIS — I70212 Atherosclerosis of native arteries of extremities with intermittent claudication, left leg: Secondary | ICD-10-CM | POA: Diagnosis not present

## 2019-08-28 NOTE — Progress Notes (Signed)
Virtual Visit via Telephone Note   I connected with Brian Graham on 08/28/2019 using the [employer] by telephone and verified that I was speaking with the correct person using two identifiers.    The limitations of evaluation and management by telemedicine and the availability of in person appointments have been previously discussed with the patient and are documented in the patients chart. The patient expressed understanding and consented to proceed.  PCP: [name], MD   Chief Complaint: 1 year follow-up for surveillance of left iliac stent  History of Present Illness: Brian Graham is a 68 y.o. male who presents for one-year follow-up for ongoing surveillance of left iliac stent.  Patient previously had left common iliac artery angioplasty and stent on 08/31/2017 by Dr. Bridgett Larsson for claudication.  He reports his legs are doing well.  He has no claudication, rest pain, or tissue loss in his lower extremities.  His only concern is he states he has some numbness in his arms whenever he sleeps on one particular side.  Past Medical History:  Diagnosis Date  . Anxiety   . Arthritis    "left knee" (08/05/2016)  . Bruises easily   . CAD (coronary artery disease) of artery bypass graft 08/05/2016   Around age 30. CABG - 3 vessel.   . Chronic kidney disease   . Colon polyp   . COPD, severe ([place]) 08/23/2016   Alpha 1 studies normal per prior pulmonary group notes Smoked 40 years, quit in 2012 June 2016 PFT from prior pulmonary group: "significant obstruction" FEV1 1.58L (47% pred), Residual volume 171% pred, DLCO 59% pred Simple Spirometry>> 09/15/2016 ratio 42% FEV1 1.02 L / 31%   . Essential tremor 06/11/2016   Plans to see Dr. Carles Collet  . Former smoker 09/01/2016   Quit 2012. 40 pack years at least.   . GERD (gastroesophageal reflux disease)   . Headache   . History of blood transfusion 08/1997   "when he had his heart surgery"  . History of shingles 1970-2013 X 3  . HTN  (hypertension) 08/05/2016   Amlodipine 5mg , atenolol 50mg  BID, benazepril 20mg   . Hyperlipemia   . Hypothyroidism   . Lumbar disc disease   . Myocardial infarction ([place]) 08/1997  . Peripheral vascular disease ([place])   . PONV (postoperative nausea and vomiting)   . Urinary tract infection 09/03/2017  . Wears glasses     Past Surgical History:  Procedure Laterality Date  . AORTOGRAM Right 08/31/2017   Procedure: AORTOGRAM BILATERAL PELVIC ANGIOGRAM WITH LEFT ILIAC ARTERY STENT;  Surgeon: Conrad , MD;  Location: [facility];  Service: Vascular;  Laterality: Right;  . BACK SURGERY    . CARDIAC CATHETERIZATION  08/1997  . CORONARY ARTERY BYPASS GRAFT  09/12/1997   "triple"  . INGUINAL HERNIA REPAIR Right 1961  . KIDNEY REMOVED    . KNEE RECONSTRUCTION Left 1960s - 1974 X 4  . LAPAROSCOPIC ABLATION RENAL MASS  ~ 2014   "large abscess/pus ball"  . [place]   "they were wedged in the bowel"  . PATELLA FRACTURE SURGERY Left 1963  . PATELLA FRACTURE SURGERY Left ~ 1965   "removed knee cap"  . ROBOT ASSISTED LAPAROSCOPIC NEPHRECTOMY Left 10/07/2017   Procedure: XI ROBOTIC ASSISTED LAPAROSCOPIC NEPHRECTOMY OF PELVIC KIDNEY;  Surgeon: Alexis Frock, MD;  Location: [facility];  Service: Urology;  Laterality: Left;  Place robot patient tower at foot of bed like prostate per Dr. Tresa Moore. Pull  extra staple loads.  . TONSILLECTOMY      No outpatient medications have been marked as taking for the 08/28/19 encounter (Appointment) with Marty Heck, MD.    12 system ROS was negative unless otherwise noted in HPI   Observations/Objective:  ABIs are normal bilaterally 1.1 on the right with a triphasic waveform and 1.01 on the left with a triphasic waveform  Iliac duplex suggest the left common iliac stent is widely patent with no flow-limiting stenosis.  Assessment and Plan:  68 year old male previous underwent a left common iliac stent by Dr. Bridgett Larsson in 2018 for claudication  presents for one-year follow-up for ongoing surveillance.  He has normal ABIs with triphasic waveform at the ankles.  He has patent stent on duplex.  He is otherwise having no symptoms in his lower extremities.  Offered reassurance and told the studies look great.  We will plan to see him again in 1 year with ABIs and aortoiliac duplex for ongoing surveillance.  Follow Up Instructions:   Follow up 1 year with studies as above.   I discussed the assessment and treatment plan with the patient. The patient was provided an opportunity to ask questions and all were answered. The patient agreed with the plan and demonstrated an understanding of the instructions.   The patient was advised to call back or seek an in-person evaluation if the symptoms worsen or if the condition fails to improve as anticipated.  I spent 5 minutes with the patient via telephone encounter.   Signed, Marty Heck [facility] Office: [phone]  08/28/2019, 11:53 AM

## 2019-08-31 ENCOUNTER — Ambulatory Visit: Payer: Medicare HMO | Admitting: Family Medicine

## 2019-08-31 ENCOUNTER — Ambulatory Visit (INDEPENDENT_AMBULATORY_CARE_PROVIDER_SITE_OTHER)
Admission: RE | Admit: 2019-08-31 | Discharge: 2019-08-31 | Disposition: A | Discharge status: Home / Self Care | Payer: Medicare HMO | Source: Ambulatory Visit | Attending: Family Medicine | Admitting: Family Medicine

## 2019-08-31 ENCOUNTER — Other Ambulatory Visit: Payer: Self-pay

## 2019-08-31 DIAGNOSIS — K469 Unspecified abdominal hernia without obstruction or gangrene: Secondary | ICD-10-CM | POA: Diagnosis not present

## 2019-08-31 DIAGNOSIS — N189 Chronic kidney disease, unspecified: Secondary | ICD-10-CM | POA: Diagnosis not present

## 2019-08-31 DIAGNOSIS — R5383 Other fatigue: Secondary | ICD-10-CM

## 2019-08-31 DIAGNOSIS — R944 Abnormal results of kidney function studies: Secondary | ICD-10-CM | POA: Diagnosis not present

## 2019-08-31 DIAGNOSIS — R1110 Vomiting, unspecified: Secondary | ICD-10-CM | POA: Diagnosis not present

## 2019-08-31 DIAGNOSIS — N183 Chronic kidney disease, stage 3 unspecified: Secondary | ICD-10-CM

## 2019-09-05 ENCOUNTER — Other Ambulatory Visit: Payer: Self-pay | Admitting: Family Medicine

## 2019-09-12 ENCOUNTER — Other Ambulatory Visit: Payer: Self-pay

## 2019-09-12 ENCOUNTER — Encounter: Payer: Self-pay | Admitting: Family Medicine

## 2019-09-12 DIAGNOSIS — R944 Abnormal results of kidney function studies: Secondary | ICD-10-CM

## 2019-09-13 ENCOUNTER — Other Ambulatory Visit: Payer: Self-pay

## 2019-09-13 ENCOUNTER — Other Ambulatory Visit (INDEPENDENT_AMBULATORY_CARE_PROVIDER_SITE_OTHER): Payer: Medicare HMO

## 2019-09-13 DIAGNOSIS — R944 Abnormal results of kidney function studies: Secondary | ICD-10-CM

## 2019-09-13 DIAGNOSIS — J441 Chronic obstructive pulmonary disease with (acute) exacerbation: Secondary | ICD-10-CM | POA: Diagnosis not present

## 2019-09-13 LAB — BASIC METABOLIC PANEL
BUN: 26 mg/dL — ABNORMAL HIGH (ref 6–23)
CO2: 30 mEq/L (ref 19–32)
Calcium: 8.9 mg/dL (ref 8.4–10.5)
Chloride: 103 mEq/L (ref 96–112)
Creatinine, Ser: 2.54 mg/dL — ABNORMAL HIGH (ref 0.40–1.50)
GFR: 25.25 mL/min — ABNORMAL LOW (ref >60.00)
Glucose, Bld: 106 mg/dL — ABNORMAL HIGH (ref 70–99)
Potassium: 4.7 mEq/L (ref 3.5–5.1)
Sodium: 141 mEq/L (ref 135–145)

## 2019-09-14 ENCOUNTER — Other Ambulatory Visit: Payer: Self-pay

## 2019-09-14 DIAGNOSIS — N184 Chronic kidney disease, stage 4 (severe): Secondary | ICD-10-CM

## 2019-09-19 ENCOUNTER — Encounter: Payer: Self-pay | Admitting: Family Medicine

## 2019-09-19 ENCOUNTER — Ambulatory Visit: Payer: Self-pay | Admitting: *Deleted

## 2019-09-19 ENCOUNTER — Encounter (HOSPITAL_COMMUNITY): Payer: Self-pay

## 2019-09-19 ENCOUNTER — Ambulatory Visit (HOSPITAL_COMMUNITY)
Admission: EM | Admit: 2019-09-19 | Discharge: 2019-09-19 | Disposition: A | Discharge status: Home / Self Care | Payer: Medicare HMO | Attending: Family Medicine | Admitting: Family Medicine

## 2019-09-19 ENCOUNTER — Other Ambulatory Visit: Payer: Self-pay

## 2019-09-19 DIAGNOSIS — N3090 Cystitis, unspecified without hematuria: Secondary | ICD-10-CM

## 2019-09-19 DIAGNOSIS — N189 Chronic kidney disease, unspecified: Secondary | ICD-10-CM

## 2019-09-19 LAB — POCT URINALYSIS DIP (DEVICE)
Bilirubin Urine: NEGATIVE
Glucose, UA: NEGATIVE mg/dL
Ketones, ur: NEGATIVE mg/dL
Nitrite: NEGATIVE
Protein, ur: >=300 mg/dL — AB
Specific Gravity, Urine: 1.025 (ref 1.005–1.030)
Urobilinogen, UA: 0.2 mg/dL (ref 0.0–1.0)
pH: 6.5 (ref 5.0–8.0)

## 2019-09-19 MED ORDER — AMOXICILLIN-POT CLAVULANATE 875-125 MG PO TABS: 1.0000 | ORAL_TABLET | Freq: Two times a day (BID) | ORAL | 0 refills | Status: DC

## 2019-09-19 NOTE — Telephone Encounter (Signed)
  Pt called in and also sent a note to Dr. [name] via his MyChart c/o seeing blood in his urine this morning with burning and having the urge to go but only having dribbles come out. He mentioned he has CKD and has had a kidney removed. Denies flank or abd pain.  Protocol is for him to go to the ED now.   I let him know he needed to go to the ED.   I also suggested the [facility].   "I would rather go to the urgent care  than sit in the ED for 8 hours".    He asked where the [facility] was so I let him know.   He verbalized understanding of the instructions.   He asked if he needed to go now and I said yes.    I was concerned about him getting an infection from the retained urine and with him only having one kidney it was best he go now.    He agreed.  I sent my notes to Dr. Ansel Bong office.      Reason for Disposition . [1] Unable to urinate (or only a few drops) > 4 hours AND [2] bladder feels very full (e.g., palpable bladder or strong urge to urinate)    Urine red/brown, burning with urination and can only dribble a little urine out but feels like he needs to go.   Also has CKD with only 1 kidney.  Answer Assessment - Initial Assessment Questions 1. COLOR of URINE: "Describe the color of the urine."  (e.g., tea-colored, pink, red, blood clots, bloody)     I woke up 5:30 this morning and everything was fine.   aAt 10:30 my urine was red/brown and I was burning.   I need to go but only having a dribble is coming out. 2. ONSET: "When did the bleeding start?"      This morning 10:30 AM 3. EPISODES: "How many times has there been blood in the urine?" or "How many times today?"     Twice 4. PAIN with URINATION: "Is there any pain with passing your urine?" If so, ask: "How bad is the pain?"  (Scale 1-10; or mild, moderate, severe)    - MILD - complains slightly about urination hurting    - MODERATE - interferes with normal activities      - SEVERE - excruciating, unwilling or  unable to urinate because of the pain      Burning with urination.   I feel urge to go but only dribbles come out.   I feel fine otherwise. 5. FEVER: "Do you have a fever?" If so, ask: "What is your temperature, how was it measured, and when did it start?"     No fever 6. ASSOCIATED SYMPTOMS: "Are you passing urine more frequently than usual?"     No back pain or abd pain.     I've not heard from the kidney specialist.     I have an appt tomorrow with Dr. [name]. 7. OTHER SYMPTOMS: "Do you have any other symptoms?" (e.g., back/flank pain, abdominal pain, vomiting)     Denies 8. PREGNANCY: "Is there any chance you are pregnant?" "When was your last menstrual period?"     N/A  Protocols used: URINE - BLOOD IN-A-AH

## 2019-09-19 NOTE — ED Provider Notes (Signed)
[facility]    ASSESSMENT & PLAN:  1. Cystitis   2. Chronic kidney disease, unspecified CKD stage     To begin: Meds ordered this encounter  Medications  . amoxicillin-clavulanate (AUGMENTIN) 875-125 MG tablet    Sig: Take 1 tablet by mouth every 12 (twelve) hours.    Dispense:  14 tablet    Refill:  0  Reports he has taken this before to treat a UTI.  Declines blood work today. No signs of pyelonephritis. Urine culture sent. Will notify patient when results available. Will follow up with his PCP or here if not showing improvement over the next 48 hours, sooner if needed.  Outlined signs and symptoms indicating need for more acute intervention. Patient verbalized understanding. After Visit Summary given.  SUBJECTIVE:  Brian Graham is a 68 y.o. male who complains of urinary frequency and "dark urine" first noted this morning; abrupt onset. Reports h/o bladder infections with similar symptoms. Has one kidney. Reports Cr stable around 2.5. Without associated flank pain, fever, chills, penile discharge or bleeding. No specific aggravating or alleviating factors reported. No LE edema. Normal PO intake without n/v/d. Without specific abdominal pain. Ambulatory without difficulty. OTC treatment: none.  ROS: As in HPI. All other systems negative.   OBJECTIVE:  Vitals:   09/19/19 1158  BP: 125/78  Pulse: 69  Resp: 16  Temp: 98.1 F (36.7 C)  TempSrc: Oral  SpO2: 100%   General appearance: alert; no distress HENT: oropharynx: moist Lungs: unlabored respirations Abdomen: soft, non-tender Back: no CVA tenderness Extremities: no edema; symmetrical with no gross deformities Skin: warm and dry Neurologic: normal gait Psychological: alert and cooperative; normal mood and affect  Labs Reviewed  POCT URINALYSIS DIP (DEVICE) - Abnormal; Notable for the following components:      Result Value   Hgb urine dipstick LARGE (*)    Protein, ur >=300 (*)    Leukocytes,Ua MODERATE (*)    All other components within normal limits  URINE CULTURE    Allergies  Allergen Reactions  . Other Itching and Rash    Gel used for ultrasound SEVERELY broke out the skin   . Keflex [Cephalexin] Diarrhea, Nausea Only and Other (See Comments)    Insomnia  . Sulfa Antibiotics Other (See Comments)    Insomnia  . Ciprofloxacin Diarrhea  . Levaquin [Levofloxacin] Other (See Comments)    Insomnia   . Omeprazole Diarrhea    Past Medical History:  Diagnosis Date  . Anxiety   . Arthritis    "left knee" (08/05/2016)  . Bruises easily   . CAD (coronary artery disease) of artery bypass graft 08/05/2016   Around age 58. CABG - 3 vessel.   . Chronic kidney disease   . Colon polyp   . COPD, severe ([place]) 08/23/2016   Alpha 1 studies normal per prior pulmonary group notes Smoked 40 years, quit in 2012 June 2016 PFT from prior pulmonary group: "significant obstruction" FEV1 1.58L (47% pred), Residual volume 171% pred, DLCO 59% pred Simple Spirometry>> 09/15/2016 ratio 42% FEV1 1.02 L / 31%   . Essential tremor 06/11/2016   Plans to see Dr. Carles Collet  . Former smoker 09/01/2016   Quit 2012. 40 pack years at least.   . GERD (gastroesophageal reflux disease)   . Headache   . History of blood transfusion 08/1997   "when he had his heart surgery"  . History of shingles 1970-2013 X 3  . HTN (hypertension) 08/05/2016   Amlodipine 5mg , atenolol  50mg  BID, benazepril 20mg   . Hyperlipemia   . Hypothyroidism   . Lumbar disc disease   . Myocardial infarction ([place]) 08/1997  . Peripheral vascular disease ([place])   . PONV (postoperative nausea and vomiting)   . Urinary tract infection 09/03/2017  . Wears glasses    Social History   Socioeconomic History  . Marital status: Married    Spouse name: Not on file  . Number of children: Not on file  . Years of education: Not on file  . Highest education level: Not on file  Occupational History  . Not on file  Social Needs  .  Financial resource strain: Not on file  . Food insecurity    Worry: Not on file    Inability: Not on file  . Transportation needs    Medical: Not on file    Non-medical: Not on file  Tobacco Use  . Smoking status: Former Smoker    Packs/day: 1.00    Years: 45.00    Pack years: 45.00    Types: Cigarettes    Quit date: 07/10/2017    Years since quitting: 2.1  . Smokeless tobacco: Never Used  Substance and Sexual Activity  . Alcohol use: Yes    Alcohol/week: 14.0 standard drinks    Types: 14 Shots of liquor per week    Comment: daily 2 shots of vodka  . Drug use: No  . Sexual activity: Not Currently  Lifestyle  . Physical activity    Days per week: Not on file    Minutes per session: Not on file  . Stress: Not on file  Relationships  . Social Herbalist on phone: Not on file    Gets together: Not on file    Attends religious service: Not on file    Active member of club or organization: Not on file    Attends meetings of clubs or organizations: Not on file    Relationship status: Not on file  . Intimate partner violence    Fear of current or ex partner: Not on file    Emotionally abused: Not on file    Physically abused: Not on file    Forced sexual activity: Not on file  Other Topics Concern  . Not on file  Social History Narrative   Lives with wife. 2 adopted daughters from [place].       Retired from [employer] History  Problem Relation Age of Onset  . Alcohol abuse Mother   . Alcohol abuse Father        not involved in life  . Alcoholism Sister   . Healthy Sister   . Healthy Sister   . Allergic rhinitis Neg Hx   . Angioedema Neg Hx   . Asthma Neg Hx   . Eczema Neg Hx   . Immunodeficiency Neg Hx   . Urticaria Neg Hx        Vanessa Kick, MD 09/19/19 1248

## 2019-09-19 NOTE — ED Triage Notes (Signed)
Patient presents to Urgent Care with complaints of frequent and dark urination since this morning. Patient reports he only has one kidney and so deals w/ problems like this frequently.

## 2019-09-19 NOTE — Telephone Encounter (Signed)
See note

## 2019-09-20 ENCOUNTER — Other Ambulatory Visit: Payer: Self-pay

## 2019-09-20 ENCOUNTER — Encounter: Payer: Self-pay | Admitting: Family Medicine

## 2019-09-20 ENCOUNTER — Ambulatory Visit: Payer: Medicare HMO | Admitting: Family Medicine

## 2019-09-20 VITALS — BP 120/64 | HR 68 | Temp 98.2°F | Ht 69.0 in | Wt 148.8 lb

## 2019-09-20 DIAGNOSIS — N184 Chronic kidney disease, stage 4 (severe): Secondary | ICD-10-CM

## 2019-09-20 DIAGNOSIS — I10 Essential (primary) hypertension: Secondary | ICD-10-CM

## 2019-09-20 DIAGNOSIS — M810 Age-related osteoporosis without current pathological fracture: Secondary | ICD-10-CM | POA: Diagnosis not present

## 2019-09-20 DIAGNOSIS — R300 Dysuria: Secondary | ICD-10-CM | POA: Diagnosis not present

## 2019-09-20 DIAGNOSIS — R310 Gross hematuria: Secondary | ICD-10-CM | POA: Diagnosis not present

## 2019-09-20 DIAGNOSIS — R1110 Vomiting, unspecified: Secondary | ICD-10-CM | POA: Diagnosis not present

## 2019-09-20 LAB — URINE CULTURE: Culture: NO GROWTH

## 2019-09-20 NOTE — Progress Notes (Signed)
Phone [phone]  Subjective:  In person visit Brian Graham is a 68 y.o. year old very pleasant male patient who presents for/with See problem oriented charting Chief Complaint  Patient presents with  . Follow-up     ROS- no fever/chills. Daily dry heaves if doesn't take zofran/ondansetron. No chest or shortness of breath. No burning with peeing other than yesterday- none today.    Past Medical History-  Patient Active Problem List   Diagnosis Date Noted  . Chronic kidney disease (CKD), stage IV (severe) ([place]) 08/13/2019    Priority: High  . S/p nephrectomy 12/05/2018    Priority: High  . MGUS (monoclonal gammopathy of unknown significance) 08/02/2018    Priority: High  . Osteoporosis 06/26/2018    Priority: High  . Atherosclerosis of native arteries of extremity with intermittent claudication ([place]) 07/06/2017    Priority: High  . Weight loss 04/23/2017    Priority: High  . Former smoker 09/01/2016    Priority: High  . COPD, severe ([place]) 08/23/2016    Priority: High  . Hx of CABG 08/05/2016    Priority: High  . CAD (coronary artery disease) of artery bypass graft 08/05/2016    Priority: High  . 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 History of adenomatous polyp of colon 04/22/2017    Priority: Medium  . Hyperlipidemia 04/04/2017    Priority: Medium  . Hypothyroidism (acquired) 04/04/2017    Priority: Medium  . Essential hypertension 08/05/2016    Priority: Medium  . Essential tremor 06/11/2016    Priority: Medium  . Perennial allergic rhinitis 01/24/2018    Priority: Low  . Anxiety 07/24/2017    Priority: Low  . Pyelonephritis 04/23/2017    Priority: Low  . Aortic atherosclerosis ([place]) 04/13/2017    Priority: Low  . Seasonal allergies 04/04/2017    Priority: Low  . Myalgia 10/04/2016    Priority: Low  . Dysuria 08/23/2016    Priority: Low  . Chronic pain of both knees 08/23/2016    Priority: Low  . Tension headache  06/11/2016    Priority: Low  . Constipation 09/03/2017    Medications- reviewed and updated Current Outpatient Medications  Medication Sig Dispense Refill  . acetaminophen (TYLENOL) 500 MG tablet Take 500-1,000 mg by mouth every 6 (six) hours as needed (for pain).     Marland Kitchen albuterol (PROVENTIL) (2.5 MG/3ML) 0.083% nebulizer solution Take 3 mLs (2.5 mg total) by nebulization every 6 (six) hours as needed for wheezing or shortness of breath. J44.9 300 mL 11  . Albuterol Sulfate (PROAIR RESPICLICK) 381 (90 Base) MCG/ACT AEPB Inhale 2 puffs into the lungs every 4 (four) hours as needed. 3 each 1  . amLODipine (NORVASC) 5 MG tablet Take 1 tablet (5 mg total) by mouth daily. 90 tablet 3  . aspirin EC 81 MG tablet Take 81 mg by mouth daily.    Marland Kitchen atenolol (TENORMIN) 50 MG tablet TAKE 1 TABLET DAILY 90 tablet 1  . budesonide-formoterol (SYMBICORT) 160-4.5 MCG/ACT inhaler USE 2 INHALATIONS TWICE A DAY (Patient taking differently: Inhale 2 puffs into the lungs 2 (two) times daily. ) 3 Inhaler 3  . rosuvastatin (CRESTOR) 40 MG tablet TAKE 1 TABLET DAILY (NEED APPOINTMENT) 90 tablet 3  . SYNTHROID 112 MCG tablet Take 1 tablet (112 mcg total) by mouth daily before breakfast. 90 tablet 3  . Tiotropium Bromide Monohydrate (SPIRIVA RESPIMAT) 2.5 MCG/ACT AERS USE 2 INHALATIONS DAILY 12 g 3  . traMADol (ULTRAM)  50 MG tablet Take 1 tablet (50 mg total) by mouth 2 (two) times daily as needed for moderate pain or severe pain. 60 tablet 5   No current facility-administered medications for this visit.      Objective:  BP 120/64   Pulse 68   Temp 98.2 F (36.8 C) (Temporal)   Ht 5\' 9"  (1.753 m)   Wt 148 lb 12.8 oz (67.5 kg)   SpO2 93%   BMI 21.97 kg/m  Gen: NAD, resting comfortably CV: RRR no murmurs rubs or gallops Lungs: CTAB no crackles, wheeze, rhonchi Abdomen: soft/nontender/nondistended/normal bowel sounds.  Ext: no edema Skin: warm, dry Neuro: normal gait and speech    Assessment and Plan     #Gross hematuria/dysuria S: Patient was seen at urgent care yesterday and diagnosed with UTI and started on Augmentin   I do not see a description in urgent care note about hematuria.  Today patient reports, patient states he woke up yesterday and noted burning with urination as well as frequency starting suddenly that morning.  He noted blood in his urine which concerned him and let him to seek care.  Patient was started on Augmentin but reports he felt very poorly on the antibiotic-does not appear it was adjusted for GFR under 30 and we discussed that it may have made him feel more ill-he had previously tolerated Augmentin well but his GFR had not been under 30 in the past.  UA had evidence of hemoglobin and leukocytes were noted. A/P: 68 year old male with unilateral functioning kidney who had dysuria and gross hematuria yesterday-dysuria resolved within hours with 1 dose of Augmentin but he did not tolerate the Augmentin.  Urine culture today did not show evidence of UTI.  We discussed possible prostatitis-recommended rectal exam which she declined today.  Since he also had gross hematuria-he does agree to follow-up with urology and knows to follow-up with [place] if he has new or worsening symptoms.  #Dry heaves/hiccups (he describes as "inverted" hiccup and wife states not a hiccup) S: Patient continues to have dry heaves but thankfully ondansetron does help. Esomeprazole 40 daily was started- no improvement in burping and deep hiccuping or improvement in dry heaves  Patient states he can count on the dry heaves every day starting exactly at 10 AM if misses doses of ondansetron.  He has history of very similar almost identical symptoms about 7 years ago when he started having kidney infection issues.  He did recently have a CT scan of his abdomen pelvis within 3 weeks 08/31/2019.  He also has a upcoming CT scan of his chest for lung cancer screening.Marland Kitchen  He does complain of low appetite at this time   He reports had extensive GI work-up in the past when had similar including PPI trial, endoscopy, colonoscopy that were unrevealing. A/P: Unclear etiology of dry heaves and hiccups -on noncontrast CT no evidence of recurrent intra-abdominal infection-discussed noncontrast CT could possibly miss smaller infections.  It seems when patient has had these type symptoms in the past has had serious underlying etiology.  I wonder if the progression of his kidneys to CKD stage IV range with GFR in mid 20s -Continue Zofran as needed as this has been very beneficial -I have already referred to nephrology-pending -Recommended urology follow-up-he also agrees to call and try to schedule this due to gross hematuria as above -Consider GI referral if above work-up is reassuring and symptoms persist -Remain off PPI as did not make significant improvement in symptoms  and less than ideal with CKD   #Osteoporosis-patient will need to Remain off Fosamax as long as GFR under 30  # Hypertension S: compliant with amlodipine 5 mg, atenolol 50mg   With decreasing renal function remains off benazepril 20mg  A/P: Stable. Continue current medications.    #CKD stage IV-appears to have progressed to stage IV with GFR under 30-he agrees to come back in 2 weeks to recheck-avoid nephrotoxic agents.  Pending nephrology referral  Recommended follow up: 2-week BMP recheck-as needed depending on results and follow-up with specialist team Future Appointments  Date Time Provider [place]  10/01/2019 10:30 AM [facility] 1 [facility]  10/04/2019 10:00 AM [facility] LAB [facility]  01/24/2020 11:00 AM [facility] LAB 3 [facility] None  01/31/2020 10:00 AM Nicholas Lose, MD [facility] None   Lab/Order associations:   ICD-10-CM   1. Gross hematuria  R31.0 Ambulatory referral to Urology  2. Essential hypertension  P03 Basic metabolic panel  3. Chronic kidney disease (CKD), stage IV (severe) ([facility])  N18.4   4. Age-related  osteoporosis without current pathological fracture  M81.0   5. Dysuria  R30.0     Return precautions advised.  Garret Reddish, MD

## 2019-09-20 NOTE — Assessment & Plan Note (Signed)
#  CKD stage IV-appears to have progressed to stage IV with GFR under 30-he agrees to come back in 2 weeks to recheck-avoid nephrotoxic agents.  Pending nephrology referral

## 2019-09-20 NOTE — Patient Instructions (Addendum)
Schedule kidney check in 2 weeks with lab visit  Please call Dr. Tresa Moore to schedule follow up visit under gross hematuria (blood in urine you can see).   Your initial urine showed Moderate leukocytes and large hemoglobin at urgent care Urine culture was negative on final culture So we are unclear what cause of symptoms was though seemed to improve on 2 doses augmentin( amoxillin- clavulanic acid) - we did not continue this due to no clear UTI.   If burning sensation returns- please return to see [place] so we can repeat UA and culture and I would also get a urine microscopic exam at that time.

## 2019-10-01 ENCOUNTER — Other Ambulatory Visit: Payer: Self-pay

## 2019-10-01 ENCOUNTER — Ambulatory Visit (INDEPENDENT_AMBULATORY_CARE_PROVIDER_SITE_OTHER)
Admission: RE | Admit: 2019-10-01 | Discharge: 2019-10-01 | Disposition: A | Discharge status: Home / Self Care | Payer: Medicare HMO | Source: Ambulatory Visit | Attending: Acute Care | Admitting: Acute Care

## 2019-10-01 DIAGNOSIS — Z122 Encounter for screening for malignant neoplasm of respiratory organs: Secondary | ICD-10-CM

## 2019-10-01 DIAGNOSIS — J439 Emphysema, unspecified: Secondary | ICD-10-CM | POA: Diagnosis not present

## 2019-10-01 DIAGNOSIS — R918 Other nonspecific abnormal finding of lung field: Secondary | ICD-10-CM

## 2019-10-01 DIAGNOSIS — F17210 Nicotine dependence, cigarettes, uncomplicated: Secondary | ICD-10-CM

## 2019-10-01 DIAGNOSIS — R911 Solitary pulmonary nodule: Secondary | ICD-10-CM | POA: Diagnosis not present

## 2019-10-01 DIAGNOSIS — Z87891 Personal history of nicotine dependence: Secondary | ICD-10-CM

## 2019-10-04 ENCOUNTER — Other Ambulatory Visit: Payer: Self-pay

## 2019-10-04 ENCOUNTER — Telehealth: Payer: Self-pay | Admitting: Acute Care

## 2019-10-04 ENCOUNTER — Other Ambulatory Visit (INDEPENDENT_AMBULATORY_CARE_PROVIDER_SITE_OTHER): Payer: Medicare HMO

## 2019-10-04 DIAGNOSIS — Z122 Encounter for screening for malignant neoplasm of respiratory organs: Secondary | ICD-10-CM

## 2019-10-04 DIAGNOSIS — I10 Essential (primary) hypertension: Secondary | ICD-10-CM

## 2019-10-04 DIAGNOSIS — F17210 Nicotine dependence, cigarettes, uncomplicated: Secondary | ICD-10-CM

## 2019-10-04 DIAGNOSIS — Z87891 Personal history of nicotine dependence: Secondary | ICD-10-CM

## 2019-10-04 LAB — BASIC METABOLIC PANEL
BUN: 23 mg/dL (ref 6–23)
CO2: 30 mEq/L (ref 19–32)
Calcium: 8.6 mg/dL (ref 8.4–10.5)
Chloride: 104 mEq/L (ref 96–112)
Creatinine, Ser: 2.49 mg/dL — ABNORMAL HIGH (ref 0.40–1.50)
GFR: 25.83 mL/min — ABNORMAL LOW (ref >60.00)
Glucose, Bld: 98 mg/dL (ref 70–99)
Potassium: 4.2 mEq/L (ref 3.5–5.1)
Sodium: 141 mEq/L (ref 135–145)

## 2019-10-04 NOTE — Progress Notes (Signed)
Kidney function is stable  [facility] team-any update on the nephrology referral?  He appears to be remaining in chronic kidney disease stage IV range

## 2019-10-04 NOTE — Telephone Encounter (Signed)
Pt informed of CT results per Sarah Groce, NP.  PT verbalized understanding.  Copy sent to PCP.  Order placed for 1 yr f/u CT.  

## 2019-10-05 ENCOUNTER — Other Ambulatory Visit: Payer: Self-pay | Admitting: Pulmonary Disease

## 2019-10-08 ENCOUNTER — Telehealth: Payer: Self-pay | Admitting: Pulmonary Disease

## 2019-10-08 MED ORDER — BUDESONIDE-FORMOTEROL FUMARATE 160-4.5 MCG/ACT IN AERO: INHALATION_SPRAY | RESPIRATORY_TRACT | 3 refills | Status: DC

## 2019-10-08 NOTE — Telephone Encounter (Signed)
I sent the Rx to [facility] for a 90 day supply. I called pt to let him know but there was no answer. I left message to have him call me back. Will await return call.

## 2019-10-09 DIAGNOSIS — R310 Gross hematuria: Secondary | ICD-10-CM | POA: Diagnosis not present

## 2019-10-09 NOTE — Telephone Encounter (Signed)
Spoke with pt. He is aware that his prescription has been taken care of. Nothing further was needed.

## 2019-10-14 DIAGNOSIS — J441 Chronic obstructive pulmonary disease with (acute) exacerbation: Secondary | ICD-10-CM | POA: Diagnosis not present

## 2019-10-24 DIAGNOSIS — I2510 Atherosclerotic heart disease of native coronary artery without angina pectoris: Secondary | ICD-10-CM | POA: Diagnosis not present

## 2019-10-24 DIAGNOSIS — N2581 Secondary hyperparathyroidism of renal origin: Secondary | ICD-10-CM | POA: Diagnosis not present

## 2019-10-24 DIAGNOSIS — D472 Monoclonal gammopathy: Secondary | ICD-10-CM | POA: Diagnosis not present

## 2019-10-24 DIAGNOSIS — Z905 Acquired absence of kidney: Secondary | ICD-10-CM | POA: Diagnosis not present

## 2019-10-24 DIAGNOSIS — N179 Acute kidney failure, unspecified: Secondary | ICD-10-CM | POA: Diagnosis not present

## 2019-10-24 DIAGNOSIS — D631 Anemia in chronic kidney disease: Secondary | ICD-10-CM | POA: Diagnosis not present

## 2019-10-24 DIAGNOSIS — N189 Chronic kidney disease, unspecified: Secondary | ICD-10-CM | POA: Diagnosis not present

## 2019-10-24 DIAGNOSIS — I129 Hypertensive chronic kidney disease with stage 1 through stage 4 chronic kidney disease, or unspecified chronic kidney disease: Secondary | ICD-10-CM | POA: Diagnosis not present

## 2019-10-24 DIAGNOSIS — E039 Hypothyroidism, unspecified: Secondary | ICD-10-CM | POA: Diagnosis not present

## 2019-10-24 DIAGNOSIS — I745 Embolism and thrombosis of iliac artery: Secondary | ICD-10-CM | POA: Diagnosis not present

## 2019-10-24 DIAGNOSIS — N184 Chronic kidney disease, stage 4 (severe): Secondary | ICD-10-CM | POA: Diagnosis not present

## 2019-10-26 DIAGNOSIS — N184 Chronic kidney disease, stage 4 (severe): Secondary | ICD-10-CM | POA: Diagnosis not present

## 2019-11-02 ENCOUNTER — Encounter: Payer: Self-pay | Admitting: Family Medicine

## 2019-11-02 ENCOUNTER — Ambulatory Visit (INDEPENDENT_AMBULATORY_CARE_PROVIDER_SITE_OTHER): Payer: Medicare HMO | Admitting: Family Medicine

## 2019-11-02 ENCOUNTER — Encounter: Payer: Self-pay | Admitting: Internal Medicine

## 2019-11-02 VITALS — Wt 146.0 lb

## 2019-11-02 DIAGNOSIS — N184 Chronic kidney disease, stage 4 (severe): Secondary | ICD-10-CM | POA: Diagnosis not present

## 2019-11-02 DIAGNOSIS — D472 Monoclonal gammopathy: Secondary | ICD-10-CM

## 2019-11-02 DIAGNOSIS — J309 Allergic rhinitis, unspecified: Secondary | ICD-10-CM | POA: Diagnosis not present

## 2019-11-02 DIAGNOSIS — R1110 Vomiting, unspecified: Secondary | ICD-10-CM | POA: Diagnosis not present

## 2019-11-02 MED ORDER — FLUTICASONE PROPIONATE 50 MCG/ACT NA SUSP: 2.0000 | Freq: Every day | NASAL | 3 refills | Status: DC

## 2019-11-02 NOTE — Patient Instructions (Signed)
There are no preventive care reminders to display for this patient.  Depression screen [facility] 2/9 08/13/2019 11/28/2018 09/03/2017  Decreased Interest 1 0 0  Down, Depressed, Hopeless 0 0 0  PHQ - 2 Score 1 0 0    Recommended follow up:No follow-ups on file.

## 2019-11-02 NOTE — Progress Notes (Signed)
Phone [phone] Virtual visit via Video note   Subjective:  Chief complaint: Chief Complaint  Patient presents with  . Chronic Kidney Disease  . Nausea   This visit type was conducted due to national recommendations for restrictions regarding the COVID-19 Pandemic (e.g. social distancing).  This format is felt to be most appropriate for this patient at this time balancing risks to patient and risks to population by having him in for in person visit.  No physical exam was performed (except for noted visual exam or audio findings with Telehealth visits).    Our team/I connected with [name] at 11:00 AM EST by a video enabled telemedicine application (doxy.me or caregility through epic) and verified that I am speaking with the correct person using two identifiers.  Location patient: Home[facility] Location provider: [facility], office Persons participating in the virtual visit:  patient  Our team/I discussed the limitations of evaluation and management by telemedicine and the availability of in person appointments. In light of current covid-19 pandemic, patient also understands that we are trying to protect them by minimizing in office contact if at all possible.  The patient expressed consent for telemedicine visit and agreed to proceed. Patient understands insurance will be billed.   ROS-patient without fever or chills.  Does have some postnasal drip which is a chronic issue-improving on Zyrtec.  Complains of recurrent dry heaves.  Past Medical History-  Patient Active Problem List   Diagnosis Date Noted  . Chronic kidney disease (CKD), stage IV (severe) ([place]) 08/13/2019    Priority: High  . S/p nephrectomy 12/05/2018    Priority: High  . MGUS (monoclonal gammopathy of unknown significance) 08/02/2018    Priority: High  . Osteoporosis 06/26/2018    Priority: High  . Atherosclerosis of native arteries of extremity with intermittent claudication ([place]) 07/06/2017    Priority:  High  . Weight loss 04/23/2017    Priority: High  . Former smoker 09/01/2016    Priority: High  . COPD, severe ([place]) 08/23/2016    Priority: High  . Hx of CABG 08/05/2016    Priority: High  . CAD (coronary artery disease) of artery bypass graft 08/05/2016    Priority: High  . 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 History of adenomatous polyp of colon 04/22/2017    Priority: Medium  . Hyperlipidemia 04/04/2017    Priority: Medium  . Hypothyroidism (acquired) 04/04/2017    Priority: Medium  . Essential hypertension 08/05/2016    Priority: Medium  . Essential tremor 06/11/2016    Priority: Medium  . Perennial allergic rhinitis 01/24/2018    Priority: Low  . Anxiety 07/24/2017    Priority: Low  . Pyelonephritis 04/23/2017    Priority: Low  . Aortic atherosclerosis ([place]) 04/13/2017    Priority: Low  . Seasonal allergies 04/04/2017    Priority: Low  . Myalgia 10/04/2016    Priority: Low  . Dysuria 08/23/2016    Priority: Low  . Chronic pain of both knees 08/23/2016    Priority: Low  . Tension headache 06/11/2016    Priority: Low  . Constipation 09/03/2017    Medications- reviewed and updated Current Outpatient Medications  Medication Sig Dispense Refill  . acetaminophen (TYLENOL) 500 MG tablet Take 500-1,000 mg by mouth every 6 (six) hours as needed (for pain).     Marland Kitchen albuterol (PROVENTIL) (2.5 MG/3ML) 0.083% nebulizer solution Take 3 mLs (2.5 mg total) by nebulization every 6 (six) hours as needed for  wheezing or shortness of breath. J44.9 300 mL 11  . Albuterol Sulfate (PROAIR RESPICLICK) 497 (90 Base) MCG/ACT AEPB Inhale 2 puffs into the lungs every 4 (four) hours as needed. 3 each 1  . aspirin EC 81 MG tablet Take 81 mg by mouth daily.    Marland Kitchen atenolol (TENORMIN) 50 MG tablet TAKE 1 TABLET DAILY 90 tablet 1  . budesonide-formoterol (SYMBICORT) 160-4.5 MCG/ACT inhaler USE 2 INHALATIONS TWICE A DAY 30.6 g 3  . ondansetron (ZOFRAN-ODT) 4 MG  disintegrating tablet Take 4-8 mg by mouth every 8 (eight) hours as needed.    Marland Kitchen SYNTHROID 112 MCG tablet Take 1 tablet (112 mcg total) by mouth daily before breakfast. 90 tablet 3  . Tiotropium Bromide Monohydrate (SPIRIVA RESPIMAT) 2.5 MCG/ACT AERS USE 2 INHALATIONS DAILY 12 g 3  . traMADol (ULTRAM) 50 MG tablet Take 1 tablet (50 mg total) by mouth 2 (two) times daily as needed for moderate pain or severe pain. 60 tablet 5  . fluticasone (FLONASE) 50 MCG/ACT nasal spray Place 2 sprays into both nostrils daily. 16 g 3   No current facility-administered medications for this visit.     Objective:  Wt 146 lb (66.2 kg)   BMI 21.56 kg/m  self reported vitals Gen: NAD, resting comfortably Lungs: nonlabored, normal respiratory rate  Skin: appears dry, no obvious rash     Assessment and Plan   #Chronic kidney disease stage IV #Dry heaves #Allergic rhinitis-postnasal drip contributing to dry heaves #MGUS-follows with oncology S: Had visit with [facility] on 10/24/2019.  Patient states he had a good visit with Dr. Justin Mend. Sees him back January 4th. 9 tubes of blood drawn. Creatinine went up to 3 day  At this point cause is unknown of worsening renal function 1. Concern that it may be vascular.  Patient requests sooner Dr. Carlis Abbott Vascular consult.  2. Also mentioned if MGUS could be related. Sees oncology back in march 3. With ongoing dry heaves- Dr. Justin Mend suggested a visit with Dr. Carlean Purl in case that contributes to prerenal element  Patient also reports a bad week last week with his dry heaves- last week had significant dry heaves. Couldn't eat for almost a week- startingto feel better. When he wakes up in the AM he has a lot of post nasal drip and if he swallows the sputum it makes symptoms worse.   Zyrtec somewhat helpful for reducing amount of PND-suggesting allergies may be an underlying cause.  Ondansetron was not helpful last week but startingto be more helpful. Has lost 9 lbs on  home scales in last 3-4 months  A/P: Chronic kidney disease stage IV of unknown etiology.  Currently working with Dr. Justin Mend.  Thankfully patient has close follow-up in early January  Dry heaves also with for recent worsening.  Patient also with 9 pound weight loss in last 3 to 4 months on home scales.  See my prior notes but patient tends to get dry heaves from non-GI pathology.  In the past he has had extensive work-up-but for example in November 2018 when he was having these issues was related to recurrent UTIs from atrophic kidney and later had nephrectomy.  With chronicity of symptoms-do not suspect COVID-19  Patient request the following 1.  A referral to Dr. Carlis Abbott of vascular surgery for his opinion on if this could be a vascular issue-appears patient had aortoiliac duplex within the last 9 months but I am struggling to find the actual report.  I am not sure  what else could be done-options with contrast would be limited due to CKD stage IV 2.  Referral to Dr. Carlean Purl of GI-I told patient I thought Dr. Carlean Purl would weigh in on if this was GI in origin and may be at least able to help with symptoms even if he did not think was GI in origin. 3.  Since postnasal drip seems to make dry heaving worse-we opted to add Flonase to his Zyrtec. 4.  We will get records from Dr. Justin Mend and review-would be happy to try to get patient back in the oncology sooner depending on what his thoughts were from MGUS perspective-previously thought to be stable and I have not considered contribution to CKD-but once again will review nephrology notes  Recommended follow up: We did not schedule plan follow-up-patient is very good about following up after consults-we will touch base after above consults.  At the latest would want to do a 3 to 25-month follow-up Future Appointments  Date Time Provider [place]  11/02/2019 11:00 AM [name], MD [facility]  01/24/2020 11:00 AM [facility] 3 [facility]  None  01/31/2020 10:00 AM Nicholas Lose, MD [facility] None    Lab/Order associations:   ICD-10-CM   1. Dry heaves  R11.10 GI/gastroenterology  2. Chronic kidney disease (CKD), stage IV (severe) (HCC)  N18.4 Ambulatory referral to Vascular Surgery  3. Allergic rhinitis, unspecified seasonality, unspecified trigger  J30.9   4. MGUS (monoclonal gammopathy of unknown significance)  D47.2     Meds ordered this encounter  Medications  . fluticasone (FLONASE) 50 MCG/ACT nasal spray    Sig: Place 2 sprays into both nostrils daily.    Dispense:  16 g    Refill:  3   Return precautions advised.  Garret Reddish, MD

## 2019-11-08 ENCOUNTER — Encounter: Payer: Self-pay | Admitting: Family Medicine

## 2019-11-08 MED ORDER — ONDANSETRON 4 MG PO TBDP: ORAL_TABLET | ORAL | 3 refills | Status: DC

## 2019-11-13 DIAGNOSIS — J441 Chronic obstructive pulmonary disease with (acute) exacerbation: Secondary | ICD-10-CM | POA: Diagnosis not present

## 2019-11-15 ENCOUNTER — Ambulatory Visit: Payer: Medicare HMO | Admitting: Internal Medicine

## 2019-11-15 ENCOUNTER — Encounter: Payer: Self-pay | Admitting: Internal Medicine

## 2019-11-15 VITALS — BP 160/92 | HR 48 | Temp 98.0°F | Ht 69.0 in | Wt 149.1 lb

## 2019-11-15 DIAGNOSIS — R110 Nausea: Secondary | ICD-10-CM

## 2019-11-15 DIAGNOSIS — R1110 Vomiting, unspecified: Secondary | ICD-10-CM

## 2019-11-15 DIAGNOSIS — R634 Abnormal weight loss: Secondary | ICD-10-CM

## 2019-11-15 NOTE — Progress Notes (Signed)
Brian Graham 68 y.o. 1951-08-06 [id number]  Assessment & Plan:   Encounter Diagnoses  Name Primary?  . Loss of weight Yes  . Nausea without vomiting   . Dry heaves     He had a recurrence of this problem that he had years ago.  I cannot really related to any type of urinary tract infection issues etc.  Temporarily he has had some associations with it but I cannot make a link pathophysiologically.  I wonder if it is not a functional disturbance anxiety driven etc.  He needs an EGD which we will do.  Consider mirtazapine.  Release of records not done today will need to follow-up on that for his previous procedure particular the colonoscopy since I am redoing the EGD.  I appreciate the opportunity to care for this patient. CC: [name], MD   Subjective:   Chief Complaint: Anorexia nausea weight loss  HPI The patient is a 1 year old married man with a 1 year or more history of nausea dry heaves anorexia.  He had a similar problem back in 2013 when he was in the [place] area, and he had an EGD and a colonoscopy at that time, he relates that he was found to have a "pus sac" around one of his kidneys which was in the pelvis and shrunken.  When he had that drained and treated he seemed to improve after that.  "It just went away".  However over the last 1 to 2 years since having that kidney removed laparoscopically though is not implicating a cause, he has had problems again with nausea especially in the morning some dry heaves burping flatulence at times and lack of appetite.  His weight is down.  He thinks about 15 pounds.  No significant lower GI symptoms other than some constipation at times.  Does not have overt dysphagia.  Had a screening colonoscopy around 2013 or 13 which was normal he thinks.  Wt Readings from Last 3 Encounters:  11/15/19 149 lb 2 oz (67.6 kg)  11/02/19 146 lb (66.2 kg)  09/20/19 148 lb 12.8 oz (67.5 kg)   He was 70 kg in January which was  about 153 pounds  CT of the abdomen and pelvis in October negative though without contrast.  That was part of his hematuria work-up.  No kidney stone.  Chest CT October negative emphysema follow-up closer follow-up because of a nodule though reverted to 12 months follow-up.  Lab Results  Component Value Date   ALT 18 08/13/2019   AST 20 08/13/2019   ALKPHOS 62 08/13/2019   BILITOT 0.7 08/13/2019   Lab Results  Component Value Date   CREATININE 2.49 (H) 10/04/2019   BUN 23 10/04/2019   NA 141 10/04/2019   K 4.2 10/04/2019   CL 104 10/04/2019   CO2 30 10/04/2019   Lab Results  Component Value Date   WBC 10.9 (H) 08/13/2019   HGB 13.0 08/13/2019   HCT 39.7 08/13/2019   MCV 94.7 08/13/2019   PLT 272.0 08/13/2019   Lab Results  Component Value Date   TSH 0.62 08/13/2019    Allergies  Allergen Reactions  . Other Itching and Rash    Gel used for ultrasound SEVERELY broke out the skin   . Keflex [Cephalexin] Diarrhea, Nausea Only and Other (See Comments)    Insomnia  . Sulfa Antibiotics Other (See Comments)    Insomnia  . Ciprofloxacin Diarrhea  . Levaquin [Levofloxacin] Other (See Comments)  Insomnia   . Omeprazole Diarrhea   Current Meds  Medication Sig  . acetaminophen (TYLENOL) 500 MG tablet Take 500-1,000 mg by mouth every 6 (six) hours as needed (for pain).   Marland Kitchen albuterol (PROVENTIL) (2.5 MG/3ML) 0.083% nebulizer solution Take 3 mLs (2.5 mg total) by nebulization every 6 (six) hours as needed for wheezing or shortness of breath. J44.9  . Albuterol Sulfate (PROAIR RESPICLICK) 254 (90 Base) MCG/ACT AEPB Inhale 2 puffs into the lungs every 4 (four) hours as needed.  Marland Kitchen aspirin EC 81 MG tablet Take 81 mg by mouth daily.  Marland Kitchen atenolol (TENORMIN) 50 MG tablet TAKE 1 TABLET DAILY  . budesonide-formoterol (SYMBICORT) 160-4.5 MCG/ACT inhaler USE 2 INHALATIONS TWICE A DAY  . fluticasone (FLONASE) 50 MCG/ACT nasal spray Place 2 sprays into both nostrils daily.  . ondansetron  (ZOFRAN-ODT) 4 MG disintegrating tablet PLACE 1 TO 2 TABLETS ON TONGUE AND ALLOW TO DISSOLVE EVERY 8 HOURS AS NEEDED FOR NAUSEA / VOMITING  . SYNTHROID 112 MCG tablet Take 1 tablet (112 mcg total) by mouth daily before breakfast.  . Tiotropium Bromide Monohydrate (SPIRIVA RESPIMAT) 2.5 MCG/ACT AERS USE 2 INHALATIONS DAILY  . traMADol (ULTRAM) 50 MG tablet Take 1 tablet (50 mg total) by mouth 2 (two) times daily as needed for moderate pain or severe pain.   Past Medical History:  Diagnosis Date  . Anxiety   . Arthritis    "left knee" (08/05/2016)  . Bruises easily   . CAD (coronary artery disease) of artery bypass graft 08/05/2016   Around age 30. CABG - 3 vessel.   . Chronic kidney disease   . Colon polyp   . COPD, severe ([place]) 08/23/2016   Alpha 1 studies normal per prior pulmonary group notes Smoked 40 years, quit in 2012 June 2016 PFT from prior pulmonary group: "significant obstruction" FEV1 1.58L (47% pred), Residual volume 171% pred, DLCO 59% pred Simple Spirometry>> 09/15/2016 ratio 42% FEV1 1.02 L / 31%   . Essential tremor 06/11/2016   Plans to see Dr. Carles Collet  . Former smoker 09/01/2016   Quit 2012. 40 pack years at least.   . GERD (gastroesophageal reflux disease)   . Headache   . History of blood transfusion 08/1997   "when he had his heart surgery"  . History of shingles 1970-2013 X 3  . HTN (hypertension) 08/05/2016   Amlodipine 5mg , atenolol 50mg  BID, benazepril 20mg   . Hyperlipemia   . Hypothyroidism   . Lumbar disc disease   . MGUS (monoclonal gammopathy of unknown significance)   . Myocardial infarction ([place]) 08/1997  . Peripheral vascular disease ([place])   . PONV (postoperative nausea and vomiting)   . Urinary tract infection 09/03/2017  . Wears glasses    Past Surgical History:  Procedure Laterality Date  . AORTOGRAM Right 08/31/2017   Procedure: AORTOGRAM BILATERAL PELVIC ANGIOGRAM WITH LEFT ILIAC ARTERY STENT;  Surgeon: Conrad Butte, MD;  Location: [facility] [facility];   Service: Vascular;  Laterality: Right;  . BACK SURGERY    . CARDIAC CATHETERIZATION  08/1997  . COLONOSCOPY  2014  . CORONARY ARTERY BYPASS GRAFT  09/12/1997   "triple"  . ESOPHAGOGASTRODUODENOSCOPY    . INGUINAL HERNIA REPAIR Right 1961  . KIDNEY REMOVED    . KNEE RECONSTRUCTION Left 1960s - 1974 X 4  . LAPAROSCOPIC ABLATION RENAL MASS  ~ 2014   "large abscess/pus ball"  . [place]   "they were wedged in the bowel"  . PATELLA FRACTURE  SURGERY Left 1963  . PATELLA FRACTURE SURGERY Left ~ 1965   "removed knee cap"  . ROBOT ASSISTED LAPAROSCOPIC NEPHRECTOMY Left 10/07/2017   Procedure: XI ROBOTIC ASSISTED LAPAROSCOPIC NEPHRECTOMY OF PELVIC KIDNEY;  Surgeon: Alexis Frock, MD;  Location: [facility];  Service: Urology;  Laterality: Left;  Place robot patient tower at foot of bed like prostate per Dr. Tresa Moore. Pull extra staple loads.  . TONSILLECTOMY     Social History   Social History Narrative   Lives with wife. 2 adopted daughters from [place].       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   Review of Systems Positive for anxiety allergies, otherwise negative.  He did have transient hematuria in the fall and was evaluated and it was negative.  Objective:   Physical Exam @BP  (!) 160/92 (BP Location: Left Arm, Patient Position: Sitting, Cuff Size: Normal)   Pulse (!) 48   Temp 98 F (36.7 C) (Oral)   Ht 5\' 9"  (1.753 m)   Wt 149 lb 2 oz (67.6 kg)   BMI 22.02 kg/m @  General:  Well-developed, well-nourished and in no acute distress Eyes:  anicteric. ENT:   Mouth and posterior pharynx free of lesions.  Neck:   supple w/o thyromegaly or mass.  Lungs: Clear to auscultation bilaterally. Heart:  S1S2, no rubs, murmurs, gallops. Abdomen:  soft, non-tender, no hepatosplenomegaly, , or mass and BS+.   + small hernia above the umbilicus  soft nontender reducible  Lymph:  no cervical or supraclavicular adenopathy. Extremities:   no edema, cyanosis or clubbing Skin   no rash. Neuro:  A&O x 3.  Psych:  appropriate mood and  Affect.   Data Reviewed:  As per HPI

## 2019-11-15 NOTE — Patient Instructions (Signed)
If you are age 69 or older, your body mass index should be between 23-30. Your Body mass index is 22.02 kg/m. If this is out of the aforementioned range listed, please consider follow up with your Primary Care Provider.  If you are age 65 or younger, your body mass index should be between 19-25. Your Body mass index is 22.02 kg/m. If this is out of the aformentioned range listed, please consider follow up with your Primary Care Provider.   You have been scheduled for an endoscopy. Please follow written instructions given to you at your visit today. If you use inhalers (even only as needed), please bring them with you on the day of your procedure.  Due to recent changes in healthcare laws, you may see the results of your imaging and laboratory studies on MyChart before your provider has had a chance to review them.  We understand that in some cases there may be results that are confusing or concerning to you. Not all laboratory results come back in the same time frame and the provider may be waiting for multiple results in order to interpret others.  Please give [place] 48 hours in order for your provider to thoroughly review all the results before contacting the office for clarification of your results.   Thank you for choosing me and [facility]

## 2019-11-19 ENCOUNTER — Other Ambulatory Visit: Payer: Self-pay | Admitting: Internal Medicine

## 2019-11-19 ENCOUNTER — Encounter: Payer: Self-pay | Admitting: Internal Medicine

## 2019-11-19 DIAGNOSIS — Z1159 Encounter for screening for other viral diseases: Secondary | ICD-10-CM

## 2019-11-21 ENCOUNTER — Ambulatory Visit (INDEPENDENT_AMBULATORY_CARE_PROVIDER_SITE_OTHER): Payer: Medicare HMO

## 2019-11-21 ENCOUNTER — Other Ambulatory Visit: Payer: Self-pay | Admitting: Internal Medicine

## 2019-11-21 DIAGNOSIS — Z1159 Encounter for screening for other viral diseases: Secondary | ICD-10-CM

## 2019-11-22 LAB — SARS CORONAVIRUS 2 (TAT 6-24 HRS): SARS Coronavirus 2: NEGATIVE

## 2019-11-26 ENCOUNTER — Telehealth: Payer: Self-pay | Admitting: Family Medicine

## 2019-11-26 ENCOUNTER — Ambulatory Visit (INDEPENDENT_AMBULATORY_CARE_PROVIDER_SITE_OTHER): Payer: Medicare HMO | Admitting: Family Medicine

## 2019-11-26 ENCOUNTER — Encounter: Payer: Self-pay | Admitting: Family Medicine

## 2019-11-26 VITALS — BP 140/78 | HR 60 | Ht 69.0 in | Wt 148.4 lb

## 2019-11-26 DIAGNOSIS — E039 Hypothyroidism, unspecified: Secondary | ICD-10-CM | POA: Diagnosis not present

## 2019-11-26 DIAGNOSIS — I2510 Atherosclerotic heart disease of native coronary artery without angina pectoris: Secondary | ICD-10-CM | POA: Diagnosis not present

## 2019-11-26 DIAGNOSIS — I129 Hypertensive chronic kidney disease with stage 1 through stage 4 chronic kidney disease, or unspecified chronic kidney disease: Secondary | ICD-10-CM | POA: Diagnosis not present

## 2019-11-26 DIAGNOSIS — N2581 Secondary hyperparathyroidism of renal origin: Secondary | ICD-10-CM | POA: Diagnosis not present

## 2019-11-26 DIAGNOSIS — N179 Acute kidney failure, unspecified: Secondary | ICD-10-CM | POA: Diagnosis not present

## 2019-11-26 DIAGNOSIS — D472 Monoclonal gammopathy: Secondary | ICD-10-CM | POA: Diagnosis not present

## 2019-11-26 DIAGNOSIS — D631 Anemia in chronic kidney disease: Secondary | ICD-10-CM | POA: Diagnosis not present

## 2019-11-26 DIAGNOSIS — I745 Embolism and thrombosis of iliac artery: Secondary | ICD-10-CM | POA: Diagnosis not present

## 2019-11-26 DIAGNOSIS — N184 Chronic kidney disease, stage 4 (severe): Secondary | ICD-10-CM

## 2019-11-26 DIAGNOSIS — N189 Chronic kidney disease, unspecified: Secondary | ICD-10-CM | POA: Diagnosis not present

## 2019-11-26 DIAGNOSIS — Z905 Acquired absence of kidney: Secondary | ICD-10-CM | POA: Diagnosis not present

## 2019-11-26 NOTE — Telephone Encounter (Signed)
Patient has been scheduled for a VV today at 3pm

## 2019-11-26 NOTE — Telephone Encounter (Signed)
Please call pt and schedule appointment today with a provider if PCP not available.

## 2019-11-26 NOTE — Telephone Encounter (Signed)
Copied from [place] [phone]. Topic: Appointment Scheduling - Scheduling Inquiry for Clinic >> Nov 26, 2019 12:30 PM Celene Kras wrote: Reason for CRM: Pt called and is requesting to be seen by PCP regarding his kidney issues asap. Please advise.

## 2019-11-26 NOTE — Progress Notes (Signed)
Phone [phone] Virtual visit via Video note   Subjective:  Chief complaint: Chief Complaint  Patient presents with  . Discuss Nephrologist visit    This visit type was conducted due to national recommendations for restrictions regarding the COVID-19 Pandemic (e.g. social distancing).  This format is felt to be most appropriate for this patient at this time balancing risks to patient and risks to population by having him in for in person visit.  No physical exam was performed (except for noted visual exam or audio findings with Telehealth visits).    Our team/I connected with Brian Graham at  3:00 PM EST by a video enabled telemedicine application (doxy.me or caregility through epic) and verified that I am speaking with the correct person using two identifiers.  Location patient: [place] Location provider: [facility], office Persons participating in the virtual visit:  patient  Our team/I discussed the limitations of evaluation and management by telemedicine and the availability of in person appointments. In light of current covid-19 pandemic, patient also understands that we are trying to protect them by minimizing in office contact if at all possible.  The patient expressed consent for telemedicine visit and agreed to proceed. Patient understands insurance will be billed.   ROS- saddened by news of potential dialysis need   Past Medical History-  Patient Active Problem List   Diagnosis Date Noted  . Chronic kidney disease (CKD), stage IV (severe) ([place]) 08/13/2019    Priority: High  . S/p nephrectomy 12/05/2018    Priority: High  . MGUS (monoclonal gammopathy of unknown significance) 08/02/2018    Priority: High  . Osteoporosis 06/26/2018    Priority: High  . Atherosclerosis of native arteries of extremity with intermittent claudication ([place]) 07/06/2017    Priority: High  . Weight loss 04/23/2017    Priority: High  . Former smoker 09/01/2016    Priority: High  .  COPD, severe ([place]) 08/23/2016    Priority: High  . Hx of CABG 08/05/2016    Priority: High  . CAD (coronary artery disease) of artery bypass graft 08/05/2016    Priority: High  . 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 History of adenomatous polyp of colon 04/22/2017    Priority: Medium  . Hyperlipidemia 04/04/2017    Priority: Medium  . Hypothyroidism (acquired) 04/04/2017    Priority: Medium  . Essential hypertension 08/05/2016    Priority: Medium  . Essential tremor 06/11/2016    Priority: Medium  . Perennial allergic rhinitis 01/24/2018    Priority: Low  . Anxiety 07/24/2017    Priority: Low  . Pyelonephritis 04/23/2017    Priority: Low  . Aortic atherosclerosis ([place]) 04/13/2017    Priority: Low  . Seasonal allergies 04/04/2017    Priority: Low  . Myalgia 10/04/2016    Priority: Low  . Dysuria 08/23/2016    Priority: Low  . Chronic pain of both knees 08/23/2016    Priority: Low  . Tension headache 06/11/2016    Priority: Low  . Constipation 09/03/2017    Medications- reviewed and updated Current Outpatient Medications  Medication Sig Dispense Refill  . acetaminophen (TYLENOL) 500 MG tablet Take 500-1,000 mg by mouth every 6 (six) hours as needed (for pain).     Marland Kitchen albuterol (PROVENTIL) (2.5 MG/3ML) 0.083% nebulizer solution Take 3 mLs (2.5 mg total) by nebulization every 6 (six) hours as needed for wheezing or shortness of breath. J44.9 300 mL 11  . Albuterol Sulfate (PROAIR RESPICLICK) 124 (  90 Base) MCG/ACT AEPB Inhale 2 puffs into the lungs every 4 (four) hours as needed. 3 each 1  . aspirin EC 81 MG tablet Take 81 mg by mouth daily.    Marland Kitchen atenolol (TENORMIN) 50 MG tablet TAKE 1 TABLET DAILY 90 tablet 1  . budesonide-formoterol (SYMBICORT) 160-4.5 MCG/ACT inhaler USE 2 INHALATIONS TWICE A DAY 30.6 g 3  . fluticasone (FLONASE) 50 MCG/ACT nasal spray Place 2 sprays into both nostrils daily. 16 g 3  . ondansetron (ZOFRAN-ODT) 4 MG  disintegrating tablet PLACE 1 TO 2 TABLETS ON TONGUE AND ALLOW TO DISSOLVE EVERY 8 HOURS AS NEEDED FOR NAUSEA / VOMITING 60 tablet 3  . SYNTHROID 112 MCG tablet Take 1 tablet (112 mcg total) by mouth daily before breakfast. 90 tablet 3  . Tiotropium Bromide Monohydrate (SPIRIVA RESPIMAT) 2.5 MCG/ACT AERS USE 2 INHALATIONS DAILY 12 g 3  . traMADol (ULTRAM) 50 MG tablet Take 1 tablet (50 mg total) by mouth 2 (two) times daily as needed for moderate pain or severe pain. 60 tablet 5   No current facility-administered medications for this visit.     Objective:  BP 140/78   Pulse 60   Ht 5\' 9"  (1.753 m)   Wt 148 lb 6.1 oz (67.3 kg) Comment: AT APPT TODAY NEPHROLOGIST  SpO2 97%   BMI 21.91 kg/m  self reported vitals Gen: NAD, resting comfortably Lungs: nonlabored, normal respiratory rate  Skin: appears dry, no obvious rash    Assessment and Plan   #CKD stage IV/dry heaves S:Patient saw Dr. Justin Mend this morning. Had 9 tubes of blood drawn last month. They are unable to pinpoint what is causing progression of kidney disease. He is being placed on transplant list and suspects will need dialysis within next 6 months. They are recommending peritoneal dialysis potentially- has class to go over options.   Has EGD tomorrow.  Continued issues with dry heaves-not hungry due to nausea but if he eats it helps.  A/P: Patient with CKD stage IV-sounds like from visit with Dr. Justin Mend that this is likely to progress- being added to transplant list apparently and getting prepped for peritoneal dialysis potential.  We will await notes from Dr. Justin Mend.  We were both disappointed in hearing about potential dialysis need within 6 months but also tried to hope for the best with hopeful stable position in CKD stage IV range or slight improvement.  Counseling was provided today as this was a very difficult day for patient  In regards to dry heaves-has EGD tomorrow.  Seems like eating actually helps some so encouraged him to  continue to do so when feels nausea. Exact cause of dry heaves remains unclear  # BP slightly high at [place] [facility] today but was at a very tough visit where he found out about potential dialysis need- recheck at follow up   Recommended follow up:  He is going to keep me updated about kidneys Future Appointments  Date Time Provider [place]  11/27/2019 10:30 AM Gatha Mayer, MD [facility]  01/24/2020 11:00 AM [facility] 3 [facility] None  01/31/2020 10:00 AM Nicholas Lose, MD [facility] None    Lab/Order associations:   ICD-10-CM   1. Chronic kidney disease (CKD), stage IV (severe) (HCC)  N18.4    Time Spent: 22 minutes of total time (3:17 PM- 3:29PM) was spent on the date of the encounter performing the following actions: chart review prior to seeing the patient, obtaining history, performing a medically necessary exam, counseling  on the treatment plan, placing orders, and documenting in our EHR.   Return precautions advised.  Garret Reddish, MD

## 2019-11-26 NOTE — Patient Instructions (Signed)
There are no preventive care reminders to display for this patient.  Depression screen [facility] 2/9 08/13/2019 11/28/2018 09/03/2017  Decreased Interest 1 0 0  Down, Depressed, Hopeless 0 0 0  PHQ - 2 Score 1 0 0    Recommended follow up: No follow-ups on file.

## 2019-11-27 ENCOUNTER — Encounter: Payer: Self-pay | Admitting: Internal Medicine

## 2019-11-27 ENCOUNTER — Ambulatory Visit (AMBULATORY_SURGERY_CENTER): Payer: Medicare HMO | Admitting: Internal Medicine

## 2019-11-27 ENCOUNTER — Other Ambulatory Visit: Payer: Self-pay

## 2019-11-27 VITALS — BP 149/74 | HR 67 | Temp 97.6°F | Resp 18 | Ht 69.0 in | Wt 149.0 lb

## 2019-11-27 DIAGNOSIS — K209 Esophagitis, unspecified without bleeding: Secondary | ICD-10-CM

## 2019-11-27 DIAGNOSIS — K2970 Gastritis, unspecified, without bleeding: Secondary | ICD-10-CM

## 2019-11-27 DIAGNOSIS — R634 Abnormal weight loss: Secondary | ICD-10-CM | POA: Diagnosis not present

## 2019-11-27 DIAGNOSIS — R110 Nausea: Secondary | ICD-10-CM | POA: Diagnosis not present

## 2019-11-27 DIAGNOSIS — K3189 Other diseases of stomach and duodenum: Secondary | ICD-10-CM | POA: Diagnosis not present

## 2019-11-27 DIAGNOSIS — K208 Other esophagitis without bleeding: Secondary | ICD-10-CM | POA: Diagnosis not present

## 2019-11-27 DIAGNOSIS — K2100 Gastro-esophageal reflux disease with esophagitis, without bleeding: Secondary | ICD-10-CM | POA: Diagnosis not present

## 2019-11-27 MED ORDER — SODIUM CHLORIDE 0.9 % IV SOLN: 500.0000 mL | INTRAVENOUS | Status: DC

## 2019-11-27 NOTE — Progress Notes (Signed)
Temp Brian Graham  vs Brian Graham

## 2019-11-27 NOTE — Progress Notes (Signed)
Called to room to assist during endoscopic procedure.  Patient ID and intended procedure confirmed with present staff. Received instructions for my participation in the procedure from the performing physician.  

## 2019-11-27 NOTE — Patient Instructions (Addendum)
There is some gastritis - stomach inflammation, and some inflammation where the esophagus and stomach meedt  I took biopsies and also also took upper intestine biopsies.  I do not see anything that looks like cancer.  Once I get the biopsy results I will let you know aht they are and what to do.  I appreciate the opportunity to care for you. Gatha Mayer, MD, FACG    YOU HAD AN ENDOSCOPIC PROCEDURE TODAY AT [facility]:   Refer to the procedure report that was given to you for any specific questions about what was found during the examination.  If the procedure report does not answer your questions, please call your gastroenterologist to clarify.  If you requested that your care partner not be given the details of your procedure findings, then the procedure report has been included in a sealed envelope for you to review at your convenience later.  YOU SHOULD EXPECT: Some feelings of bloating in the abdomen. Passage of more gas than usual.  Walking can help get rid of the air that was put into your GI tract during the procedure and reduce the bloating. If you had a lower endoscopy (such as a colonoscopy or flexible sigmoidoscopy) you may notice spotting of blood in your stool or on the toilet paper. If you underwent a bowel prep for your procedure, you may not have a normal bowel movement for a few days.  Please Note:  You might notice some irritation and congestion in your nose or some drainage.  This is from the oxygen used during your procedure.  There is no need for concern and it should clear up in a day or so.  SYMPTOMS TO REPORT IMMEDIATELY:     Following upper endoscopy (EGD)  Vomiting of blood or coffee ground material  New chest pain or pain under the shoulder blades  Painful or persistently difficult swallowing  New shortness of breath  Fever of 100F or higher  Black, tarry-looking stools  For urgent or emergent issues, a gastroenterologist can be  reached at any hour by calling [phone].   DIET:  We do recommend a small meal at first, but then you may proceed to your regular diet.  Drink plenty of fluids but you should avoid alcoholic beverages for 24 hours.  ACTIVITY:  You should plan to take it easy for the rest of today and you should NOT DRIVE or use heavy machinery until tomorrow (because of the sedation medicines used during the test).    FOLLOW UP: Our staff will call the number listed on your records 48-72 hours following your procedure to check on you and address any questions or concerns that you may have regarding the information given to you following your procedure. If we do not reach you, we will leave a message.  We will attempt to reach you two times.  During this call, we will ask if you have developed any symptoms of COVID 19. If you develop any symptoms (ie: fever, flu-like symptoms, shortness of breath, cough etc.) before then, please call [phone].  If you test positive for Covid 19 in the 2 weeks post procedure, please call and report this information to [place].    If any biopsies were taken you will be contacted by phone or by letter within the next 1-3 weeks.  Please call us at [phone] if you have not heard about the biopsies in 3 weeks.    SIGNATURES/CONFIDENTIALITY: You and/or your care  partner have signed paperwork which will be entered into your electronic medical record.  These signatures attest to the fact that that the information above on your After Visit Summary has been reviewed and is understood.  Full responsibility of the confidentiality of this discharge information lies with you and/or your care-partner.   Resume medications. Information given on gastritis and esophagitis.

## 2019-11-27 NOTE — Progress Notes (Signed)
To PACU, VSS. Report to [email or web address] 

## 2019-11-27 NOTE — Op Note (Signed)
[place] Patient Name: Eldra Word Procedure Date: 11/27/2019 10:20 AM MRN: [id number] Endoscopist: Gatha Mayer , MD Age: 69 Referring MD:  Date of Birth: 08-24-1951 Gender: Male Account #: [id number] Procedure:                Upper GI endoscopy Indications:              Nausea, Weight loss Medicines:                Propofol per Anesthesia, Monitored Anesthesia Care Procedure:                Pre-Anesthesia Assessment:                           - Prior to the procedure, a History and Physical                            was performed, and patient medications and                            allergies were reviewed. The patient's tolerance of                            previous anesthesia was also reviewed. The risks                            and benefits of the procedure and the sedation                            options and risks were discussed with the patient.                            All questions were answered, and informed consent                            was obtained. Prior Anticoagulants: The patient has                            taken no previous anticoagulant or antiplatelet                            agents. ASA Grade Assessment: III - A patient with                            severe systemic disease. After reviewing the risks                            and benefits, the patient was deemed in                            satisfactory condition to undergo the procedure.                           After obtaining informed consent, the endoscope was  passed under direct vision. Throughout the                            procedure, the patient's blood pressure, pulse, and                            oxygen saturations were monitored continuously. The                            Endoscope was introduced through the mouth, and                            advanced to the second part of duodenum. The upper                            GI  endoscopy was accomplished without difficulty.                            The patient tolerated the procedure well. Scope In: Scope Out: Findings:                 LA Grade A (one or more mucosal breaks less than 5                            mm, not extending between tops of 2 mucosal folds)                            esophagitis with no bleeding was found at the                            gastroesophageal junction. Biopsies were taken with                            a cold forceps for histology. Verification of                            patient identification for the specimen was done.                            Estimated blood loss was minimal.                           Patchy mild inflammation characterized by erosions,                            erythema and granularity was found in the gastric                            antrum. Biopsies were taken with a cold forceps for                            histology. Verification of patient identification  for the specimen was done. Estimated blood loss was                            minimal.                           Diffuse atrophic mucosa was found in the second                            portion of the duodenum. Biopsies were taken with a                            cold forceps for histology. Verification of patient                            identification for the specimen was done. Estimated                            blood loss was minimal.                           The exam was otherwise without abnormality.                           The cardia and gastric fundus were normal on                            retroflexion. Complications:            No immediate complications. Estimated Blood Loss:     Estimated blood loss was minimal. Impression:               - LA Grade A reflux esophagitis with no bleeding.                            Biopsied.                           - Gastritis. Biopsied. Could be from ASA  (on 81 mg                            qd) and no PPI currently (hx of diarrhea from                            omeprazole)                           - Duodenal mucosal atrophy. Biopsied.                           - The examination was otherwise normal. Recommendation:           - Patient has a contact number available for                            emergencies. The signs and symptoms of potential  delayed complications were discussed with the                            patient. Return to normal activities tomorrow.                            Written discharge instructions were provided to the                            patient.                           - Resume previous diet.                           - Continue present medications.                           - Await pathology results. Gatha Mayer, MD 11/27/2019 10:52:06 AM This report has been signed electronically.

## 2019-11-29 ENCOUNTER — Telehealth: Payer: Self-pay

## 2019-11-29 ENCOUNTER — Telehealth: Payer: Self-pay | Admitting: *Deleted

## 2019-11-29 NOTE — Telephone Encounter (Signed)
  Follow up Call-  Call back number 11/27/2019  Post procedure Call Back phone  # [phone]  Permission to leave phone message Yes  Some recent data might be hidden     Patient questions:  Message left to call us if necessary.

## 2019-11-29 NOTE — Telephone Encounter (Signed)
Left detailed message on both patient's cell and home number to call me back with the name of his [facility]. So we can request his EGD/colon/path reports.  We have the signed ROI already.

## 2019-11-30 ENCOUNTER — Other Ambulatory Visit: Payer: Self-pay | Admitting: Pulmonary Disease

## 2019-12-03 ENCOUNTER — Other Ambulatory Visit: Payer: Self-pay

## 2019-12-03 ENCOUNTER — Encounter: Payer: Self-pay | Admitting: Family Medicine

## 2019-12-03 MED ORDER — PROAIR RESPICLICK 108 (90 BASE) MCG/ACT IN AEPB: 2.0000 | INHALATION_SPRAY | RESPIRATORY_TRACT | 1 refills | Status: DC | PRN

## 2019-12-04 NOTE — Telephone Encounter (Signed)
Patient called back and gave [place] his information on his previous GI Doctor. Dr Vella Redhead, phone # [phone], fax # [phone]. I have faxed the ROI.

## 2019-12-07 ENCOUNTER — Other Ambulatory Visit: Payer: Self-pay | Admitting: *Deleted

## 2019-12-10 ENCOUNTER — Other Ambulatory Visit: Payer: Self-pay | Admitting: *Deleted

## 2019-12-10 MED ORDER — PANTOPRAZOLE SODIUM 20 MG PO TBEC: 20.0000 mg | DELAYED_RELEASE_TABLET | Freq: Every day | ORAL | 0 refills | Status: DC

## 2019-12-11 ENCOUNTER — Ambulatory Visit: Payer: Medicare HMO

## 2019-12-14 DIAGNOSIS — J441 Chronic obstructive pulmonary disease with (acute) exacerbation: Secondary | ICD-10-CM | POA: Diagnosis not present

## 2019-12-25 ENCOUNTER — Encounter: Payer: Self-pay | Admitting: Vascular Surgery

## 2019-12-25 ENCOUNTER — Other Ambulatory Visit: Payer: Self-pay

## 2019-12-25 ENCOUNTER — Ambulatory Visit (INDEPENDENT_AMBULATORY_CARE_PROVIDER_SITE_OTHER): Payer: Medicare HMO | Admitting: Vascular Surgery

## 2019-12-25 VITALS — BP 143/78 | HR 63 | Temp 97.2°F | Resp 16 | Ht 69.0 in | Wt 144.0 lb

## 2019-12-25 DIAGNOSIS — N184 Chronic kidney disease, stage 4 (severe): Secondary | ICD-10-CM | POA: Diagnosis not present

## 2019-12-25 NOTE — Progress Notes (Signed)
Patient name: Brian Graham MRN: [id number] DOB: 02-19-1951 Sex: male  REASON FOR VISIT: Evaluate vascular etiology for declining kidney function  HPI: Brian Graham is a 69 y.o. male with history of hypertension, hyperlipidemia, COPD, CAD as well as declining kidney function and now CKD stage IV that presents to rule out vascular etiology for his declining kidney function.  In review of records and talking to the patient he was born with a left pelvic kidney.  Ultimately underwent nephrectomy for his pelvic kidney several years ago by Dr. Tammi Klippel that patient states was due to recurrent infections.  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  He is well-known to the vascular surgery service and previously underwent a left iliac stent in 2018 by Dr. Bridgett Larsson for claudication.  He was last seen last year and stent was doing fine and recommended 1 year follow-up.  In review of the records patient states his kidney function has been declining and his nephrologist is stumped as to why.  In reviewing the notes appears he had a normal creatinine in 2019 and then this started to increase in January 2020.  Ultimately this was initially thought to be potentially due to dehydration and has had some gastrointestinal illness.  Now documented as CKD stage IV per Dr. Justin Mend.   Past Medical History:  Diagnosis Date  . Anxiety   . Arthritis    "left knee" (08/05/2016)  . Bruises easily   . CAD (coronary artery disease) of artery bypass graft 08/05/2016   Around age 27. CABG - 3 vessel.   . Chronic kidney disease   . Colon polyp   . COPD, severe ([place]) 08/23/2016   Alpha 1 studies normal per prior pulmonary group notes Smoked 40 years, quit in 2012 June 2016 PFT from prior pulmonary group: "significant obstruction" FEV1 1.58L (47% pred), Residual volume 171% pred, DLCO 59% pred Simple Spirometry>> 09/15/2016 ratio 42% FEV1 1.02 L / 31%   . Emphysema of lung ([place])   . Essential tremor 06/11/2016   Plans to see Dr.  Carles Collet  . Former smoker 09/01/2016   Quit 2012. 40 pack years at least.   . GERD (gastroesophageal reflux disease)   . Headache   . History of blood transfusion 08/1997   "when he had his heart surgery"  . History of shingles 1970-2013 X 3  . HTN (hypertension) 08/05/2016   Amlodipine 5mg , atenolol 50mg  BID, benazepril 20mg   . Hyperlipemia   . Hypothyroidism   . Lumbar disc disease   . MGUS (monoclonal gammopathy of unknown significance)   . Myocardial infarction ([place]) 08/1997  . Peripheral vascular disease ([place])   . PONV (postoperative nausea and vomiting)   . Urinary tract infection 09/03/2017  . Wears glasses     Past Surgical History:  Procedure Laterality Date  . AORTOGRAM Right 08/31/2017   Procedure: AORTOGRAM BILATERAL PELVIC ANGIOGRAM WITH LEFT ILIAC ARTERY STENT;  Surgeon: Conrad Oldsmar, MD;  Location: [facility];  Service: Vascular;  Laterality: Right;  . BACK SURGERY    . CARDIAC CATHETERIZATION  08/1997  . COLONOSCOPY  2014  . CORONARY ARTERY BYPASS GRAFT  09/12/1997   "triple"  . ESOPHAGOGASTRODUODENOSCOPY    . INGUINAL HERNIA REPAIR Right 1961  . KIDNEY REMOVED    . KNEE RECONSTRUCTION Left 1960s - 1974 X 4  . LAPAROSCOPIC ABLATION RENAL MASS  ~ 2014   "large abscess/pus ball"  . LUMBAR DISC SURGERY  1990s   "they were wedged in the  bowel"  . PATELLA FRACTURE SURGERY Left 1963  . PATELLA FRACTURE SURGERY Left ~ 1965   "removed knee cap"  . ROBOT ASSISTED LAPAROSCOPIC NEPHRECTOMY Left 10/07/2017   Procedure: XI ROBOTIC ASSISTED LAPAROSCOPIC NEPHRECTOMY OF PELVIC KIDNEY;  Surgeon: Alexis Frock, MD;  Location: [facility];  Service: Urology;  Laterality: Left;  Place robot patient tower at foot of bed like prostate per Dr. Tresa Moore. Pull extra staple loads.  . TONSILLECTOMY      Family History  Problem Relation Age of Onset  . Alcohol abuse Mother   . Alcohol abuse Father        not involved in life  . Alcoholism Sister   . Healthy Sister   . Healthy Sister   .  Allergic rhinitis Neg Hx   . Angioedema Neg Hx   . Asthma Neg Hx   . Eczema Neg Hx   . Immunodeficiency Neg Hx   . Urticaria Neg Hx     SOCIAL HISTORY: Social History   Tobacco Use  . Smoking status: Former Smoker    Packs/day: 1.00    Years: 45.00    Pack years: 45.00    Types: Cigarettes    Quit date: 07/10/2017    Years since quitting: 2.4  . Smokeless tobacco: Never Used  Substance Use Topics  . Alcohol use: Yes    Alcohol/week: 14.0 standard drinks    Types: 14 Shots of liquor per week    Comment: daily 2 shots of vodka    Allergies  Allergen Reactions  . Contrast Media [Iodinated Diagnostic Agents] Other (See Comments)    NOT ALLOWED DUE TO KIDNEY ISSUES.  . Other Itching and Rash    Gel used for ultrasound SEVERELY broke out the skin   . Keflex [Cephalexin] Diarrhea, Nausea Only and Other (See Comments)    Insomnia  . Sulfa Antibiotics Other (See Comments)    Insomnia  . Ciprofloxacin Diarrhea  . Levaquin [Levofloxacin] Other (See Comments)    Insomnia   . Omeprazole Diarrhea    Current Outpatient Medications  Medication Sig Dispense Refill  . acetaminophen (TYLENOL) 500 MG tablet Take 500-1,000 mg by mouth every 6 (six) hours as needed (for pain).     Marland Kitchen albuterol (PROVENTIL) (2.5 MG/3ML) 0.083% nebulizer solution Take 3 mLs (2.5 mg total) by nebulization every 6 (six) hours as needed for wheezing or shortness of breath. J44.9 300 mL 11  . Albuterol Sulfate (PROAIR RESPICLICK) 921 (90 Base) MCG/ACT AEPB Inhale 2 puffs into the lungs every 4 (four) hours as needed. 3 each 1  . aspirin EC 81 MG tablet Take 81 mg by mouth daily.    Marland Kitchen atenolol (TENORMIN) 50 MG tablet TAKE 1 TABLET DAILY 90 tablet 1  . budesonide-formoterol (SYMBICORT) 160-4.5 MCG/ACT inhaler USE 2 INHALATIONS TWICE A DAY 30.6 g 3  . fluticasone (FLONASE) 50 MCG/ACT nasal spray Place 2 sprays into both nostrils daily. 16 g 3  . ondansetron (ZOFRAN-ODT) 4 MG disintegrating tablet PLACE 1 TO 2  TABLETS ON TONGUE AND ALLOW TO DISSOLVE EVERY 8 HOURS AS NEEDED FOR NAUSEA / VOMITING 60 tablet 3  . pantoprazole (PROTONIX) 20 MG tablet Take 1 tablet (20 mg total) by mouth daily before breakfast. 90 tablet 0  . SYNTHROID 112 MCG tablet Take 1 tablet (112 mcg total) by mouth daily before breakfast. 90 tablet 3  . Tiotropium Bromide Monohydrate (SPIRIVA RESPIMAT) 2.5 MCG/ACT AERS USE 2 INHALATIONS DAILY 12 g 3  . traMADol (ULTRAM) 50 MG tablet  Take 1 tablet (50 mg total) by mouth 2 (two) times daily as needed for moderate pain or severe pain. 60 tablet 5   No current facility-administered medications for this visit.    REVIEW OF SYSTEMS:  [X]  denotes positive finding, [ ]  denotes negative finding Cardiac  Comments:  Chest pain or chest pressure:    Shortness of breath upon exertion:    Short of breath when lying flat:    Irregular heart rhythm:        Vascular    Pain in calf, thigh, or hip brought on by ambulation:    Pain in feet at night that wakes you up from your sleep:     Blood clot in your veins:    Leg swelling:         Pulmonary    Oxygen at home:    Productive cough:     Wheezing:         Neurologic    Sudden weakness in arms or legs:     Sudden numbness in arms or legs:     Sudden onset of difficulty speaking or slurred speech:    Temporary loss of vision in one eye:     Problems with dizziness:         Gastrointestinal    Blood in stool:     Vomited blood:         Genitourinary    Burning when urinating:     Blood in urine:        Psychiatric    Major depression:         Hematologic    Bleeding problems:    Problems with blood clotting too easily:        Skin    Rashes or ulcers:        Constitutional    Fever or chills:      PHYSICAL EXAM: Vitals:   12/25/19 0931  BP: (!) 143/78  Pulse: 63  Resp: 16  Temp: (!) 97.2 F (36.2 C)  TempSrc: Temporal  SpO2: 98%  Weight: 144 lb (65.3 kg)  Height: 5\' 9"  (1.753 m)    GENERAL: The patient  is a well-nourished male, in no acute distress. The vital signs are documented above. CARDIAC: There is a regular rate and rhythm.  VASCULAR:  Palpable femoral pulses bilaterally PULMONARY: There is good air exchange bilaterally without wheezing or rales. ABDOMEN: Soft and non-tender with normal pitched bowel sounds.  MUSCULOSKELETAL: There are no major deformities or cyanosis. NEUROLOGIC: No focal weakness or paresthesias are detected. SKIN: There are no ulcers or rashes noted. PSYCHIATRIC: The patient has a normal affect.  DATA:   I reviewed the CT abdomen pelvis without contrast from 08/31/2019 and there does appear to be some atherosclerotic plaque at the right renal artery ostium.  Assessment/Plan:  69 year old male with multiple medical problems as noted above that presents for evaluation of ongoing decline in kidney function over the last year now with CKD stage IV.  Patient has undergone extensive work-up with Dr. Justin Mend as noted by [facility].  Ultimately vascular surgery was asked to evaluate if there was an underlying vascular etiology that could explain his decline in kidney function after extensive work-up.  I did review his CT scan from 2020 and there does appear to be some atherosclerotic plaque at the right renal artery ostium (and patient now has single right kidney as noted above).  Discussed that we could certainly get a right renal artery  duplex to ensure he does not have any high-grade renal artery stenosis that could be possible etiology.  His wife states he has never undergone evaluation in the past with any type of renal ultrasound.  We will get him back right renal artery ultrasound and I will contact him with results.  Discussed certainly if there is greater than 60% stenosis that we consider significant would likely plan for aortogram and intervention but need to finish the work-up first.   Marty Heck, MD [facility]  [facility]: [phone]

## 2019-12-26 ENCOUNTER — Other Ambulatory Visit: Payer: Self-pay | Admitting: *Deleted

## 2019-12-26 DIAGNOSIS — N184 Chronic kidney disease, stage 4 (severe): Secondary | ICD-10-CM

## 2019-12-27 ENCOUNTER — Other Ambulatory Visit: Payer: Self-pay

## 2019-12-27 ENCOUNTER — Telehealth: Payer: Self-pay | Admitting: Vascular Surgery

## 2019-12-27 ENCOUNTER — Ambulatory Visit (HOSPITAL_COMMUNITY)
Admission: RE | Admit: 2019-12-27 | Discharge: 2019-12-27 | Disposition: A | Discharge status: Home / Self Care | Payer: Medicare HMO | Source: Ambulatory Visit | Attending: Vascular Surgery | Admitting: Vascular Surgery

## 2019-12-27 DIAGNOSIS — N184 Chronic kidney disease, stage 4 (severe): Secondary | ICD-10-CM | POA: Insufficient documentation

## 2019-12-27 NOTE — Telephone Encounter (Signed)
I reviewed Mr. Forker's renal duplex and his single right renal artery appears widely patent.  No evidence of renal artery stenosis to suggest etiology for rising renal function.  It is a normal size kidney as well which is also reassuring.  Certainly if Dr. Justin Mend has any other questions or vascular can be of further assistance for work-up of his kidney function I'd be happy to help but no other recommendations from [place] at this time.  Marty Heck, MD [facility] [place] Office: [phone] Pager: [place]

## 2019-12-31 ENCOUNTER — Encounter: Payer: Self-pay | Admitting: *Deleted

## 2020-01-06 ENCOUNTER — Ambulatory Visit: Payer: Medicare HMO

## 2020-01-24 ENCOUNTER — Other Ambulatory Visit: Payer: Self-pay

## 2020-01-24 ENCOUNTER — Inpatient Hospital Stay: Payer: Medicare HMO | Attending: Hematology and Oncology

## 2020-01-24 DIAGNOSIS — Z79899 Other long term (current) drug therapy: Secondary | ICD-10-CM | POA: Diagnosis not present

## 2020-01-24 DIAGNOSIS — C9000 Multiple myeloma not having achieved remission: Secondary | ICD-10-CM | POA: Insufficient documentation

## 2020-01-24 DIAGNOSIS — E8581 Light chain (AL) amyloidosis: Secondary | ICD-10-CM | POA: Diagnosis not present

## 2020-01-24 DIAGNOSIS — D472 Monoclonal gammopathy: Secondary | ICD-10-CM | POA: Insufficient documentation

## 2020-01-24 DIAGNOSIS — Z881 Allergy status to other antibiotic agents status: Secondary | ICD-10-CM | POA: Diagnosis not present

## 2020-01-24 DIAGNOSIS — W109XXA Fall (on) (from) unspecified stairs and steps, initial encounter: Secondary | ICD-10-CM | POA: Insufficient documentation

## 2020-01-24 DIAGNOSIS — N179 Acute kidney failure, unspecified: Secondary | ICD-10-CM | POA: Diagnosis not present

## 2020-01-24 DIAGNOSIS — M85819 Other specified disorders of bone density and structure, unspecified shoulder: Secondary | ICD-10-CM | POA: Diagnosis not present

## 2020-01-24 DIAGNOSIS — Z882 Allergy status to sulfonamides status: Secondary | ICD-10-CM | POA: Diagnosis not present

## 2020-01-24 LAB — CBC WITH DIFFERENTIAL (CANCER CENTER ONLY)
Abs Immature Granulocytes: 0.03 10*3/uL (ref 0.00–0.07)
Basophils Absolute: 0 10*3/uL (ref 0.0–0.1)
Basophils Relative: 0 %
Eosinophils Absolute: 0.5 10*3/uL (ref 0.0–0.5)
Eosinophils Relative: 6 %
HCT: 32.2 % — ABNORMAL LOW (ref 39.0–52.0)
Hemoglobin: 10.1 g/dL — ABNORMAL LOW (ref 13.0–17.0)
Immature Granulocytes: 0 %
Lymphocytes Relative: 16 %
Lymphs Abs: 1.2 10*3/uL (ref 0.7–4.0)
MCH: 30.3 pg (ref 26.0–34.0)
MCHC: 31.4 g/dL (ref 30.0–36.0)
MCV: 96.7 fL (ref 80.0–100.0)
Monocytes Absolute: 1 10*3/uL (ref 0.1–1.0)
Monocytes Relative: 13 %
Neutro Abs: 4.9 10*3/uL (ref 1.7–7.7)
Neutrophils Relative %: 65 %
Platelet Count: 216 10*3/uL (ref 150–400)
RBC: 3.33 MIL/uL — ABNORMAL LOW (ref 4.22–5.81)
RDW: 13.2 % (ref 11.5–15.5)
WBC Count: 7.6 10*3/uL (ref 4.0–10.5)
nRBC: 0 % (ref 0.0–0.2)

## 2020-01-24 LAB — CMP (CANCER CENTER ONLY)
ALT: 16 U/L (ref 0–44)
AST: 18 U/L (ref 15–41)
Albumin: 3.3 g/dL — ABNORMAL LOW (ref 3.5–5.0)
Alkaline Phosphatase: 66 U/L (ref 38–126)
Anion gap: 8 (ref 5–15)
BUN: 18 mg/dL (ref 8–23)
CO2: 30 mmol/L (ref 22–32)
Calcium: 8.8 mg/dL — ABNORMAL LOW (ref 8.9–10.3)
Chloride: 105 mmol/L (ref 98–111)
Creatinine: 2.63 mg/dL — ABNORMAL HIGH (ref 0.61–1.24)
GFR, Est AFR Am: 28 mL/min — ABNORMAL LOW (ref >60)
GFR, Estimated: 24 mL/min — ABNORMAL LOW (ref >60)
Glucose, Bld: 92 mg/dL (ref 70–99)
Potassium: 4 mmol/L (ref 3.5–5.1)
Sodium: 143 mmol/L (ref 135–145)
Total Bilirubin: 0.6 mg/dL (ref 0.3–1.2)
Total Protein: 6.7 g/dL (ref 6.5–8.1)

## 2020-01-25 LAB — KAPPA/LAMBDA LIGHT CHAINS
Kappa free light chain: 97 mg/L — ABNORMAL HIGH (ref 3.3–19.4)
Kappa, lambda light chain ratio: 2.47 — ABNORMAL HIGH (ref 0.26–1.65)
Lambda free light chains: 39.3 mg/L — ABNORMAL HIGH (ref 5.7–26.3)

## 2020-01-28 LAB — MULTIPLE MYELOMA PANEL, SERUM
Albumin SerPl Elph-Mcnc: 3.2 g/dL (ref 2.9–4.4)
Albumin/Glob SerPl: 1.1 (ref 0.7–1.7)
Alpha 1: 0.2 g/dL (ref 0.0–0.4)
Alpha2 Glob SerPl Elph-Mcnc: 1.1 g/dL — ABNORMAL HIGH (ref 0.4–1.0)
B-Globulin SerPl Elph-Mcnc: 0.8 g/dL (ref 0.7–1.3)
Gamma Glob SerPl Elph-Mcnc: 0.9 g/dL (ref 0.4–1.8)
Globulin, Total: 3 g/dL (ref 2.2–3.9)
IgA: 213 mg/dL (ref 61–437)
IgG (Immunoglobin G), Serum: 818 mg/dL (ref 603–1613)
IgM (Immunoglobulin M), Srm: 112 mg/dL (ref 20–172)
Total Protein ELP: 6.2 g/dL (ref 6.0–8.5)

## 2020-01-30 NOTE — Progress Notes (Signed)
Patient Care Team: [name], MD as PCP - General (Family Medicine) Juanito Doom, MD as Consulting Physician (Pulmonary Disease) Minus Breeding, MD as Consulting Physician (Cardiology) Lyndee Hensen, PT as Physical Therapist (Physical Therapy)  DIAGNOSIS:    ICD-10-CM   1. MGUS (monoclonal gammopathy of unknown significance)  D47.2      CHIEF COMPLIANT: Follow-up of MGUS  INTERVAL HISTORY: Brian Graham is a 69 y.o. with above-mentioned history of MGUS. Labs on 01/24/20 showed Hg 10.1, HCT 32.2, WBC 7.6, creatinine 2.63, kappa light chin 97.0, lambda light chain 39.3, ratio 2.47, m-protein not observed. He presents to the clinic today for follow-up.  He was in the hospital with acute renal failure and there is no clear-cut etiology.  It is possibly related to vasculitis but there is no proof of that.  Dr. Justin Mend is his nephrologist who has been counseling him and suggested that if his kidney function were to get worse and he would need hemodialysis.  ALLERGIES:  is allergic to contrast media [iodinated diagnostic agents]; other; keflex [cephalexin]; sulfa antibiotics; ciprofloxacin; levaquin [levofloxacin]; and omeprazole.  MEDICATIONS:  Current Outpatient Medications  Medication Sig Dispense Refill  . acetaminophen (TYLENOL) 500 MG tablet Take 500-1,000 mg by mouth every 6 (six) hours as needed (for pain).     Marland Kitchen albuterol (PROVENTIL) (2.5 MG/3ML) 0.083% nebulizer solution Take 3 mLs (2.5 mg total) by nebulization every 6 (six) hours as needed for wheezing or shortness of breath. J44.9 300 mL 11  . Albuterol Sulfate (PROAIR RESPICLICK) 025 (90 Base) MCG/ACT AEPB Inhale 2 puffs into the lungs every 4 (four) hours as needed. 3 each 1  . aspirin EC 81 MG tablet Take 81 mg by mouth daily.    Marland Kitchen atenolol (TENORMIN) 50 MG tablet TAKE 1 TABLET DAILY 90 tablet 1  . budesonide-formoterol (SYMBICORT) 160-4.5 MCG/ACT inhaler USE 2 INHALATIONS TWICE A DAY 30.6 g 3  . fluticasone  (FLONASE) 50 MCG/ACT nasal spray Place 2 sprays into both nostrils daily. 16 g 3  . ondansetron (ZOFRAN-ODT) 4 MG disintegrating tablet PLACE 1 TO 2 TABLETS ON TONGUE AND ALLOW TO DISSOLVE EVERY 8 HOURS AS NEEDED FOR NAUSEA / VOMITING 60 tablet 3  . pantoprazole (PROTONIX) 20 MG tablet Take 1 tablet (20 mg total) by mouth daily before breakfast. 90 tablet 0  . SYNTHROID 112 MCG tablet Take 1 tablet (112 mcg total) by mouth daily before breakfast. 90 tablet 3  . Tiotropium Bromide Monohydrate (SPIRIVA RESPIMAT) 2.5 MCG/ACT AERS USE 2 INHALATIONS DAILY 12 g 3  . traMADol (ULTRAM) 50 MG tablet Take 1 tablet (50 mg total) by mouth 2 (two) times daily as needed for moderate pain or severe pain. 60 tablet 5   No current facility-administered medications for this visit.    PHYSICAL EXAMINATION: ECOG PERFORMANCE STATUS: 1 - Symptomatic but completely ambulatory  Vitals:   01/31/20 1002  BP: 137/65  Pulse: 65  Resp: 17  Temp: 98.3 F (36.8 C)  SpO2: 98%   Filed Weights   01/31/20 1002  Weight: 145 lb 3.2 oz (65.9 kg)    LABORATORY DATA:  I have reviewed the data as listed CMP Latest Ref Rng & Units 01/24/2020 10/04/2019 09/13/2019  Glucose 70 - 99 mg/dL 92 98 106(H)  BUN 8 - 23 mg/dL 18 23 26(H)  Creatinine 0.61 - 1.24 mg/dL 2.63(H) 2.49(H) 2.54(H)  Sodium 135 - 145 mmol/L 143 141 141  Potassium 3.5 - 5.1 mmol/L 4.0 4.2 4.7  Chloride 98 -  111 mmol/L 105 104 103  CO2 22 - 32 mmol/L 30 30 30   Calcium 8.9 - 10.3 mg/dL 8.8(L) 8.6 8.9  Total Protein 6.5 - 8.1 g/dL 6.7 - -  Total Bilirubin 0.3 - 1.2 mg/dL 0.6 - -  Alkaline Phos 38 - 126 U/L 66 - -  AST 15 - 41 U/L 18 - -  ALT 0 - 44 U/L 16 - -    Lab Results  Component Value Date   WBC 7.6 01/24/2020   HGB 10.1 (L) 01/24/2020   HCT 32.2 (L) 01/24/2020   MCV 96.7 01/24/2020   PLT 216 01/24/2020   NEUTROABS 4.9 01/24/2020    ASSESSMENT & PLAN:  MGUS (monoclonal gammopathy of unknown significance) Elevated light chains: Kappa 80,  K:L ratio 14, SPEP revealed no significant M protein. Normal hemoglobin, creatinine, calcium Bone survey: No lytic lesions X-ray of the shoulder reveal osteopenia. No clear lytic lesions. The entire work-up was initiated because patient fell down steps and hurt his left shoulder. X-ray of the shoulder revealed changes that were slightly concerning for myeloma although there were no lytic changes. This led to blood work that showed elevated kappa and that prompted the referral.  Lab review 01/24/2020: CBC is normal hemoglobin 10.1 Kappa 97, lambda 39, ratio 2.47 SPEP: No M protein, normal immunofixation Creatinine 2.63, calcium 8.8  Lab review: Patient has worsening kidney function and also polyclonal light chain elevation.  I do not see any M protein.  The rise in kappa simultaneously goes with the lambda and hence is not considered to be progression.     Renal failure: I called and discussed the case with Dr. Edrick Oh who suggested that we obtain the bone marrow biopsy to completely rule out plasma cell dyscrasias. We will set him up for a bone marrow biopsy to be done in 2 weeks.  No orders of the defined types were placed in this encounter.  The patient has a good understanding of the overall plan. he agrees with it. he will call with any problems that may develop before the next visit here.  Total time spent: 20 mins including face to face time and time spent for planning, charting and coordination of care  Brian Lose, MD 01/31/2020  I, Cloyde Reams Dorshimer, am acting as scribe for Dr. Nicholas Graham.  I have reviewed the above documentation for accuracy and completeness, and I agree with the above.

## 2020-01-31 ENCOUNTER — Inpatient Hospital Stay: Payer: Medicare HMO | Admitting: Hematology and Oncology

## 2020-01-31 ENCOUNTER — Other Ambulatory Visit: Payer: Self-pay | Admitting: *Deleted

## 2020-01-31 ENCOUNTER — Other Ambulatory Visit: Payer: Self-pay

## 2020-01-31 DIAGNOSIS — D472 Monoclonal gammopathy: Secondary | ICD-10-CM

## 2020-01-31 NOTE — Assessment & Plan Note (Signed)
Elevated light chains: Kappa 80, K:L ratio 14, SPEP revealed no significant M protein. Normal hemoglobin, creatinine, calcium Bone survey: No lytic lesions X-ray of the shoulder reveal osteopenia. No clear lytic lesions. The entire work-up was initiated because patient fell down steps and hurt his left shoulder. X-ray of the shoulder revealed changes that were slightly concerning for myeloma although there were no lytic changes. This led to blood work that showed elevated kappa and that prompted the referral.  Lab review 01/24/2020: CBC is normal hemoglobin 10.1 Kappa 97, lambda 39, ratio 2.47 SPEP: No M protein, normal immunofixation Creatinine 2.63, calcium 8.8  Lab review: Patient has worsening kidney function and also polyclonal light chain elevation.  I do not see any M protein.  The rise in kappa simultaneously goes with the lambda and hence is not considered to be progression.  The renal dysfunction is likely from other reason and would require a nephrology consult.  We can continue to monitor these once a year with labs.

## 2020-02-01 ENCOUNTER — Other Ambulatory Visit: Payer: Self-pay | Admitting: Family Medicine

## 2020-02-01 ENCOUNTER — Telehealth: Payer: Self-pay | Admitting: Hematology and Oncology

## 2020-02-01 NOTE — Telephone Encounter (Signed)
Are you ok to continue refill on medications?

## 2020-02-01 NOTE — Telephone Encounter (Signed)
Scheduled per 03/11 los, patient has been called and voicemail was left.

## 2020-02-02 ENCOUNTER — Other Ambulatory Visit: Payer: Self-pay | Admitting: Family Medicine

## 2020-02-05 ENCOUNTER — Ambulatory Visit: Payer: Medicare HMO | Admitting: Internal Medicine

## 2020-02-05 ENCOUNTER — Encounter: Payer: Self-pay | Admitting: Internal Medicine

## 2020-02-05 VITALS — BP 142/58 | HR 70 | Temp 98.4°F | Ht 69.0 in | Wt 147.0 lb

## 2020-02-05 DIAGNOSIS — K2100 Gastro-esophageal reflux disease with esophagitis, without bleeding: Secondary | ICD-10-CM | POA: Diagnosis not present

## 2020-02-05 DIAGNOSIS — K5909 Other constipation: Secondary | ICD-10-CM

## 2020-02-05 DIAGNOSIS — R634 Abnormal weight loss: Secondary | ICD-10-CM

## 2020-02-05 DIAGNOSIS — R110 Nausea: Secondary | ICD-10-CM | POA: Diagnosis not present

## 2020-02-05 MED ORDER — PANTOPRAZOLE SODIUM 20 MG PO TBEC: 20.0000 mg | DELAYED_RELEASE_TABLET | Freq: Every day | ORAL | 3 refills | Status: DC

## 2020-02-05 NOTE — Progress Notes (Signed)
Brian Graham 69 y.o. 27-Jun-1951 [id number]  Assessment & Plan:   Encounter Diagnoses  Name Primary?  . Gastroesophageal reflux disease with esophagitis without hemorrhage Yes  . Chronic constipation   . Loss of weight   . Chronic nausea     While not asymptomatic he is better.  We will continue current treatment plan.  Add MiraLAX daily titrating for effect with his chronic constipation.   Best of luck with MGUS work-up and kidney failure.  We discussed that ondansetron can be constipating as well, though he has had constipation for longer than he has been using that.  An additional consideration depending upon clinical course would be using mirtazapine for his nausea and that would stimulate appetite as well.  Defer to primary care or other healthcare providers at this point.  I will see him as needed.   CC: [name], MD Dr. Edrick Oh  Subjective:   Chief Complaint: Follow-up of nausea and weight loss, reflux esophagitis  HPI Brian Graham is here for follow-up after I had seen him because of nausea and weight loss, EGD on January 5 demonstrated some reflux esophagitis, gastropathy and mild villous blunting but no true celiac disease or other abnormalities.  I started him on pantoprazole 20 mg daily, he is tolerating that.  He thinks it helps.  He still has to take 1 or 2 ondansetron every morning to reduce nausea.  His appetite is better and he is eating about 2 meals a day up from 1.  His weight is climbing slightly.  Unfortunately he needs a bone marrow biopsy to look for plasma cell dyscrasias in the setting of MGUS and worsening kidney function.  He has had a nephrectomy in the past and ever since then about 3 or 4 years ago he has had hard stools outside of when he had diarrhea from omeprazole.  Stool softeners provided no benefit.  He is asking for advice on this and what he might take that is safe for his remaining kidney. Wt Readings from Last 3 Encounters:    02/05/20 147 lb (66.7 kg)  01/31/20 145 lb 3.2 oz (65.9 kg)  12/25/19 144 lb (65.3 kg)   He remains quite concerned about his kidney failure and the prospect of kidney transplant or dialysis.  He would like to know what is causing it and is satisfied with his physicians but concerned about lack of etiology.  Allergies  Allergen Reactions  . Contrast Media [Iodinated Diagnostic Agents] Other (See Comments)    NOT ALLOWED DUE TO KIDNEY ISSUES.  . Other Itching and Rash    Gel used for ultrasound SEVERELY broke out the skin   . Keflex [Cephalexin] Diarrhea, Nausea Only and Other (See Comments)    Insomnia  . Sulfa Antibiotics Other (See Comments)    Insomnia  . Ciprofloxacin Diarrhea  . Levaquin [Levofloxacin] Other (See Comments)    Insomnia   . Omeprazole Diarrhea   Current Meds  Medication Sig  . acetaminophen (TYLENOL) 500 MG tablet Take 500-1,000 mg by mouth every 6 (six) hours as needed (for pain).   Marland Kitchen albuterol (PROVENTIL) (2.5 MG/3ML) 0.083% nebulizer solution Take 3 mLs (2.5 mg total) by nebulization every 6 (six) hours as needed for wheezing or shortness of breath. J44.9  . Albuterol Sulfate (PROAIR RESPICLICK) 850 (90 Base) MCG/ACT AEPB Inhale 2 puffs into the lungs every 4 (four) hours as needed.  Marland Kitchen aspirin EC 81 MG tablet Take 81 mg by mouth daily.  Marland Kitchen  atenolol (TENORMIN) 50 MG tablet TAKE 1 TABLET DAILY  . budesonide-formoterol (SYMBICORT) 160-4.5 MCG/ACT inhaler USE 2 INHALATIONS TWICE A DAY  . fluticasone (FLONASE) 50 MCG/ACT nasal spray Place 2 sprays into both nostrils daily.  . ondansetron (ZOFRAN-ODT) 4 MG disintegrating tablet PLACE 1 TO 2 TABLETS ON TONGUE AND ALLOW TO DISSOLVE EVERY 8 HOURS AS NEEDED FOR NAUSEA / VOMITING  . pantoprazole (PROTONIX) 20 MG tablet Take 1 tablet (20 mg total) by mouth daily before breakfast.  . SYNTHROID 112 MCG tablet Take 1 tablet (112 mcg total) by mouth daily before breakfast.  . Tiotropium Bromide Monohydrate (SPIRIVA RESPIMAT)  2.5 MCG/ACT AERS USE 2 INHALATIONS DAILY  . traMADol (ULTRAM) 50 MG tablet Take 1 tablet (50 mg total) by mouth 2 (two) times daily as needed for moderate pain or severe pain.   Past Medical History:  Diagnosis Date  . Anxiety   . Arthritis    "left knee" (08/05/2016)  . Bruises easily   . CAD (coronary artery disease) of artery bypass graft 08/05/2016   Around age 6. CABG - 3 vessel.   . Chronic kidney disease   . Colon polyp   . COPD, severe ([place]) 08/23/2016   Alpha 1 studies normal per prior pulmonary group notes Smoked 40 years, quit in 2012 June 2016 PFT from prior pulmonary group: "significant obstruction" FEV1 1.58L (47% pred), Residual volume 171% pred, DLCO 59% pred Simple Spirometry>> 09/15/2016 ratio 42% FEV1 1.02 L / 31%   . Emphysema of lung ([place])   . Essential tremor 06/11/2016   Plans to see Dr. Carles Collet  . Former smoker 09/01/2016   Quit 2012. 40 pack years at least.   . GERD (gastroesophageal reflux disease)   . Headache   . History of blood transfusion 08/1997   "when he had his heart surgery"  . History of shingles 1970-2013 X 3  . HTN (hypertension) 08/05/2016   Amlodipine 55m, atenolol 567mBID, benazepril 206m. Hyperlipemia   . Hypothyroidism   . Lumbar disc disease   . MGUS (monoclonal gammopathy of unknown significance)   . Myocardial infarction ([facility]  . Peripheral vascular disease ([facility] (postoperative nausea and vomiting)   . Urinary tract infection 09/03/2017  . Wears glasses    Past Surgical History:  Procedure Laterality Date  . AORTOGRAM Right 08/31/2017   Procedure: AORTOGRAM BILATERAL PELVIC ANGIOGRAM WITH LEFT ILIAC ARTERY STENT;  Surgeon: CheConrad BurlingtonD;  [facility];  Laterality: Right;  . BACK SURGERY    . CARDIAC CATHETERIZATION  08/1997  . COLONOSCOPY  2014  . CORONARY ARTERY BYPASS GRAFT  09/12/1997   "triple"  . ESOPHAGOGASTRODUODENOSCOPY    . INGUINAL HERNIA REPAIR Right 1961  . KIDNEY  REMOVED    . KNEE RECONSTRUCTION Left 1960s - 1974 X 4  . LAPAROSCOPIC ABLATION RENAL MASS  ~ 2014   "large abscess/pus ball"  . LUMWoodruff"they were wedged in the bowel"  . PATELLA FRACTURE SURGERY Left 1963  . PATELLA FRACTURE SURGERY Left ~ 1965   "removed knee cap"  . ROBOT ASSISTED LAPAROSCOPIC NEPHRECTOMY Left 10/07/2017   Procedure: XI ROBOTIC ASSISTED LAPAROSCOPIC NEPHRECTOMY OF PELVIC KIDNEY;  Surgeon: ManAlexis FrockD;  Location: [facility];  Service: Urology;  Laterality: Left;  Place robot patient tower at foot of bed like prostate per Dr. ManTresa Mooreull extra staple loads.  . TONSILLECTOMY     Social History  Social History Narrative   Lives with wife. 2 adopted daughters from [place].       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   Review of Systems As per HPI  Objective:   Physical Exam BP (!) 142/58   Pulse 70   Temp 98.4 F (36.9 C)   Ht 5' 9"  (1.753 m)   Wt 147 lb (66.7 kg)   BMI 21.71 kg/m

## 2020-02-05 NOTE — Patient Instructions (Signed)
We have sent the following prescriptions to your mail in pharmacy:  Pantoprazole  If you have not heard from your mail in pharmacy within 1 week or if you have not received your medication in the mail, please contact us at [phone] so we may find out why.    Take a dose of Miralax daily. Adjust the dose up or down for your chronic constipation.    I appreciate the opportunity to care for you. Silvano Rusk, MD, [facility]

## 2020-02-11 ENCOUNTER — Telehealth: Payer: Self-pay | Admitting: Family Medicine

## 2020-02-11 ENCOUNTER — Other Ambulatory Visit: Payer: Self-pay

## 2020-02-11 ENCOUNTER — Encounter: Payer: Self-pay | Admitting: Family Medicine

## 2020-02-11 ENCOUNTER — Ambulatory Visit (INDEPENDENT_AMBULATORY_CARE_PROVIDER_SITE_OTHER): Payer: Medicare HMO | Admitting: Family Medicine

## 2020-02-11 VITALS — BP 140/70 | HR 61 | Temp 97.3°F | Ht 69.0 in | Wt 145.6 lb

## 2020-02-11 DIAGNOSIS — H9122 Sudden idiopathic hearing loss, left ear: Secondary | ICD-10-CM

## 2020-02-11 NOTE — Telephone Encounter (Signed)
I can see him at 51 40 today if hes able and willing to wait (as working him in)

## 2020-02-11 NOTE — Telephone Encounter (Signed)
Got a hold of patient and is kay to come in at 1140, having Tammy put him on schedule.

## 2020-02-11 NOTE — Progress Notes (Signed)
Phone [phone] In person visit   Subjective:   Brian Graham is a 69 y.o. year old very pleasant male patient who presents for/with See problem oriented charting Chief Complaint  Patient presents with  . Follow-up  . decreased hearing    This visit occurred during the SARS-CoV-2 public health emergency.  Safety protocols were in place, including screening questions prior to the visit, additional usage of staff PPE, and extensive cleaning of exam room while observing appropriate contact time as indicated for disinfecting solutions.   Past Medical History-  Patient Active Problem List   Diagnosis Date Noted  . Chronic kidney disease (CKD), stage IV (severe) ([place]) 08/13/2019    Priority: High  . S/p nephrectomy 12/05/2018    Priority: High  . MGUS (monoclonal gammopathy of unknown significance) 08/02/2018    Priority: High  . Osteoporosis 06/26/2018    Priority: High  . Atherosclerosis of native arteries of extremity with intermittent claudication ([place]) 07/06/2017    Priority: High  . Weight loss 04/23/2017    Priority: High  . Former smoker 09/01/2016    Priority: High  . COPD, severe ([place]) 08/23/2016    Priority: High  . Hx of CABG 08/05/2016    Priority: High  . CAD (coronary artery disease) of artery bypass graft 08/05/2016    Priority: High  . 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 History of adenomatous polyp of colon 04/22/2017    Priority: Medium  . Hyperlipidemia 04/04/2017    Priority: Medium  . Hypothyroidism (acquired) 04/04/2017    Priority: Medium  . Essential hypertension 08/05/2016    Priority: Medium  . Essential tremor 06/11/2016    Priority: Medium  . Perennial allergic rhinitis 01/24/2018    Priority: Low  . Anxiety 07/24/2017    Priority: Low  . Pyelonephritis 04/23/2017    Priority: Low  . Aortic atherosclerosis ([place]) 04/13/2017    Priority: Low  . Seasonal allergies 04/04/2017    Priority: Low  .  Myalgia 10/04/2016    Priority: Low  . Dysuria 08/23/2016    Priority: Low  . Chronic pain of both knees 08/23/2016    Priority: Low  . Tension headache 06/11/2016    Priority: Low  . Constipation 09/03/2017    Medications- reviewed and updated Current Outpatient Medications  Medication Sig Dispense Refill  . acetaminophen (TYLENOL) 500 MG tablet Take 500-1,000 mg by mouth every 6 (six) hours as needed (for pain).     Marland Kitchen albuterol (PROVENTIL) (2.5 MG/3ML) 0.083% nebulizer solution Take 3 mLs (2.5 mg total) by nebulization every 6 (six) hours as needed for wheezing or shortness of breath. J44.9 300 mL 11  . Albuterol Sulfate (PROAIR RESPICLICK) 093 (90 Base) MCG/ACT AEPB Inhale 2 puffs into the lungs every 4 (four) hours as needed. 3 each 1  . aspirin EC 81 MG tablet Take 81 mg by mouth daily.    Marland Kitchen atenolol (TENORMIN) 50 MG tablet TAKE 1 TABLET DAILY 90 tablet 3  . budesonide-formoterol (SYMBICORT) 160-4.5 MCG/ACT inhaler USE 2 INHALATIONS TWICE A DAY 30.6 g 3  . fluticasone (FLONASE) 50 MCG/ACT nasal spray Place 2 sprays into both nostrils daily. 16 g 3  . ondansetron (ZOFRAN-ODT) 4 MG disintegrating tablet PLACE 1 TO 2 TABLETS ON TONGUE AND ALLOW TO DISSOLVE EVERY 8 HOURS AS NEEDED FOR NAUSEA / VOMITING 60 tablet 1  . pantoprazole (PROTONIX) 20 MG tablet Take 1 tablet (20 mg total) by mouth daily before breakfast. 90  tablet 3  . SYNTHROID 112 MCG tablet Take 1 tablet (112 mcg total) by mouth daily before breakfast. 90 tablet 3  . Tiotropium Bromide Monohydrate (SPIRIVA RESPIMAT) 2.5 MCG/ACT AERS USE 2 INHALATIONS DAILY 12 g 3  . traMADol (ULTRAM) 50 MG tablet Take 1 tablet (50 mg total) by mouth 2 (two) times daily as needed for moderate pain or severe pain. 60 tablet 5   No current facility-administered medications for this visit.     Objective:  BP 140/70   Pulse 61   Temp (!) 97.3 F (36.3 C)   Ht 5\' 9"  (1.753 m)   Wt 145 lb 9.6 oz (66 kg)   SpO2 97%   BMI 21.50 kg/m  Gen:  NAD, resting comfortably TM normal bilaterally. Tried autoinsufflation with no improvement in hearing. He does not hear me speaking on the left side. He is able to hear through air conduction the tuning fork and hears it through bone conduction less. Weber test localizes to the right.  Neuro: CN II-XII intact other than hearing loss, sensation and reflexes normal (other than known decreased reflex left knee after prior surgery)  throughout, 5/5 muscle strength in bilateral upper and lower extremities. Normal finger to nose. Normal rapid alternating movements. No pronator drift. Normal romberg. Normal gait.      Assessment and Plan  Decreased Hearing in Left Ear S: pt c/o clicking in his left ear before it rains for his entire life- 2 days before and  As long as it rains and resolves in 1-2 days after stops.  Recently we had a lot of rain and he was hearing the clicking pretty regularly but had no hearing loss. Then last night,  He states he rolled over and now he can't hear from the left ear, he denies pain and states this has never happened before.  Has had some tension in neck and some pain in back of neck from tension with essential tremor. Perhaps faint left sided frontal headache-  A/P: Sudden hearing loss starting last night- will do urgent referral to ENT. Do not see obvious cause such as ear wax or otitis media with effusion (though possible).   - I do not think we need to do neuroimaging prior to ENT input for mild left frontal headache or neck tension -neuro exam reassuring other than hearing loss  Recommended follow up: as needed for acute issue Future Appointments  Date Time Provider [place]  02/14/2020  7:30 AM [facility] INFUSION [facility] None  02/14/2020  9:00 AM [facility] LAB 6 [facility] None  02/28/2020 10:15 AM Nicholas Lose, MD [facility] None    Lab/Order associations:   ICD-10-CM   1. Sudden left hearing loss  H91.22 Ambulatory referral to ENT   Time  Spent: 14 minutes of total time (11:43 AM- 11:57 AM) was spent on the date of the encounter performing the following actions: chart review prior to seeing the patient, obtaining history, performing a medically necessary exam, counseling on the treatment plan, placing orders, and documenting in our EHR.   Return precautions advised.  Garret Reddish, MD

## 2020-02-11 NOTE — Telephone Encounter (Signed)
Do not see any open app this week. Did you want to work in or have me call and let him know you are out of office two days this week and will need to see another provider?

## 2020-02-11 NOTE — Telephone Encounter (Signed)
L/m to call office.  Can you call and see if you can get him in today in the 1140?

## 2020-02-11 NOTE — Patient Instructions (Addendum)
Info for ear nose and throat visit to be given by team once available

## 2020-02-11 NOTE — Telephone Encounter (Signed)
Patient states he can not hear out of his left ear.  He does not want to see any other provider this week.  States that Dr. [name] informed him that he could be worked in if he called.  Patient is requesting to be worked in this week with Dr. [name].

## 2020-02-12 LAB — BASIC METABOLIC PANEL
BUN: 23 — AB (ref 4–21)
CO2: 30 — AB (ref 13–22)
Chloride: 104 (ref 99–108)
Creatinine: 2.5 — AB (ref 0.6–1.3)
Glucose: 98
Potassium: 4.2 (ref 3.4–5.3)
Sodium: 141 (ref 137–147)

## 2020-02-12 LAB — CBC AND DIFFERENTIAL
Hemoglobin: 13 — AB (ref 13.5–17.5)
Platelets: 272 (ref 150–399)
WBC: 10.9

## 2020-02-12 LAB — COMPREHENSIVE METABOLIC PANEL: Calcium: 8.6 — AB (ref 8.7–10.7)

## 2020-02-12 LAB — HEPATIC FUNCTION PANEL
ALT: 18 (ref 10–40)
AST: 20 (ref 14–40)

## 2020-02-12 LAB — TSH: TSH: 0.62 (ref 0.41–5.90)

## 2020-02-14 ENCOUNTER — Encounter: Payer: Self-pay | Admitting: Hematology and Oncology

## 2020-02-14 ENCOUNTER — Inpatient Hospital Stay: Payer: Medicare HMO

## 2020-02-14 ENCOUNTER — Encounter: Payer: Self-pay | Admitting: Family Medicine

## 2020-02-14 ENCOUNTER — Inpatient Hospital Stay (HOSPITAL_BASED_OUTPATIENT_CLINIC_OR_DEPARTMENT_OTHER): Payer: Medicare HMO | Admitting: Adult Health

## 2020-02-14 ENCOUNTER — Other Ambulatory Visit: Payer: Self-pay

## 2020-02-14 VITALS — BP 109/63 | HR 62 | Temp 98.0°F | Resp 17

## 2020-02-14 DIAGNOSIS — D472 Monoclonal gammopathy: Secondary | ICD-10-CM | POA: Diagnosis not present

## 2020-02-14 LAB — CBC WITH DIFFERENTIAL (CANCER CENTER ONLY)
Abs Immature Granulocytes: 0.02 10*3/uL (ref 0.00–0.07)
Basophils Absolute: 0 10*3/uL (ref 0.0–0.1)
Basophils Relative: 1 %
Eosinophils Absolute: 0.3 10*3/uL (ref 0.0–0.5)
Eosinophils Relative: 3 %
HCT: 32.3 % — ABNORMAL LOW (ref 39.0–52.0)
Hemoglobin: 10.2 g/dL — ABNORMAL LOW (ref 13.0–17.0)
Immature Granulocytes: 0 %
Lymphocytes Relative: 14 %
Lymphs Abs: 1.3 10*3/uL (ref 0.7–4.0)
MCH: 29.8 pg (ref 26.0–34.0)
MCHC: 31.6 g/dL (ref 30.0–36.0)
MCV: 94.4 fL (ref 80.0–100.0)
Monocytes Absolute: 0.8 10*3/uL (ref 0.1–1.0)
Monocytes Relative: 9 %
Neutro Abs: 6.4 10*3/uL (ref 1.7–7.7)
Neutrophils Relative %: 73 %
Platelet Count: 217 10*3/uL (ref 150–400)
RBC: 3.42 MIL/uL — ABNORMAL LOW (ref 4.22–5.81)
RDW: 13.2 % (ref 11.5–15.5)
WBC Count: 8.8 10*3/uL (ref 4.0–10.5)
nRBC: 0 % (ref 0.0–0.2)

## 2020-02-14 NOTE — Progress Notes (Signed)
[place] BONE MARROW BIOPSY/ASPIRATE PROGRESS NOTE  Procedure completed at 08:30. Patient tolerated well. Pressure dressing applied to the left hip with instructions to leave in place for 24 hours. Patient instructed to report any bleeding that saturates dressing and to take pain medication Tylenol or Tramadol as directed. Dressing dry and intact to the left hip on discharge. Patient ambulated out of clinic without incident.  Vitals:   02/14/20 0744 02/14/20 0857  BP: 138/84 109/63  Pulse: 71 62  Resp: 17 17  Temp: 98 F (36.7 C)   TempSrc: Temporal   SpO2: 99% 99%

## 2020-02-14 NOTE — Patient Instructions (Signed)
Bone Marrow Aspiration and Bone Marrow Biopsy, Adult, Care After This sheet gives you information about how to care for yourself after your procedure. Your health care provider may also give you more specific instructions. If you have problems or questions, contact your health care provider. What can I expect after the procedure? After the procedure, it is common to have:  Mild pain and tenderness.  Swelling.  Bruising. Follow these instructions at home: Puncture site care   Follow instructions from your health care provider about how to take care of the puncture site. Make sure you: ? Wash your hands with soap and water before and after you change your bandage (dressing). If soap and water are not available, use hand sanitizer. ? Change your dressing as told by your health care provider.  Check your puncture site every day for signs of infection. Check for: ? More redness, swelling, or pain. ? Fluid or blood. ? Warmth. ? Pus or a bad smell. Activity  Return to your normal activities as told by your health care provider. Ask your health care provider what activities are safe for you.  Do not lift anything that is heavier than 10 lb (4.5 kg), or the limit that you are told, until your health care provider says that it is safe.  Do not drive for 24 hours if you were given a sedative during your procedure. General instructions   Take over-the-counter and prescription medicines only as told by your health care provider.  Do not take baths, swim, or use a hot tub until your health care provider approves. Ask your health care provider if you may take showers. You may only be allowed to take sponge baths.  If directed, put ice on the affected area. To do this: ? Put ice in a plastic bag. ? Place a towel between your skin and the bag. ? Leave the ice on for 20 minutes, 2-3 times a day.  Keep all follow-up visits as told by your health care provider. This is important. Contact a  health care provider if:  Your pain is not controlled with medicine.  You have a fever.  You have more redness, swelling, or pain around the puncture site.  You have fluid or blood coming from the puncture site.  Your puncture site feels warm to the touch.  You have pus or a bad smell coming from the puncture site. Summary  After the procedure, it is common to have mild pain, tenderness, swelling, and bruising.  Follow instructions from your health care provider about how to take care of the puncture site and what activities are safe for you.  Take over-the-counter and prescription medicines only as told by your health care provider.  Contact a health care provider if you have any signs of infection, such as fluid or blood coming from the puncture site. This information is not intended to replace advice given to you by your health care provider. Make sure you discuss any questions you have with your health care provider. Document Revised: 03/27/2019 Document Reviewed: 03/27/2019 Elsevier Patient Education  2020 Elsevier Inc.  

## 2020-02-14 NOTE — Progress Notes (Signed)
INDICATION: MGUS  Brief examination was performed. ENT: adequate airway clearance Heart: regular rate and rhythm.No Murmurs Lungs: clear to auscultation, no wheezes, normal respiratory effort  Bone Marrow Biopsy and Aspiration Procedure Note   Informed consent was obtained and potential risks including bleeding, infection and pain were reviewed with the patient.  The patient's name, date of birth, identification, consent and allergies were verified prior to the start of procedure and time out was performed.  The left posterior iliac crest was chosen as the site of biopsy.  The skin was prepped with ChloraPrep.   8 cc of 2% lidocaine was used to provide local anaesthesia.   10 cc of bone marrow aspirate was obtained followed by 1cm biopsy.  Pressure was applied to the biopsy site and bandage was placed over the biopsy site. Patient was made to lie on the back for 30 mins prior to discharge.  The procedure was tolerated well. COMPLICATIONS: None BLOOD LOSS: none The patient was discharged home in stable condition with a 1 week follow up to review results.  Patient was provided with post bone marrow biopsy instructions and instructed to call if there was any bleeding or worsening pain.  Specimens sent for flow cytometry, cytogenetics and additional studies.  Signed Scot Dock, NP

## 2020-02-15 ENCOUNTER — Telehealth: Payer: Self-pay | Admitting: Adult Health

## 2020-02-15 NOTE — Telephone Encounter (Signed)
No 3/25 los. No changes made to pt schedule.

## 2020-02-18 LAB — SURGICAL PATHOLOGY

## 2020-02-21 ENCOUNTER — Encounter (HOSPITAL_COMMUNITY): Payer: Self-pay | Admitting: Hematology and Oncology

## 2020-02-26 ENCOUNTER — Encounter (HOSPITAL_COMMUNITY): Payer: Self-pay | Admitting: Hematology and Oncology

## 2020-02-27 ENCOUNTER — Other Ambulatory Visit: Payer: Self-pay | Admitting: Family Medicine

## 2020-02-27 NOTE — Progress Notes (Signed)
HEMATOLOGY-ONCOLOGY [facility] VIDEO VISIT PROGRESS NOTE  I connected with [name] on 02/28/2020 at 10:15 AM EDT by MyChart video conference and verified that I am speaking with the correct person using two identifiers.  I discussed the limitations, risks, security and privacy concerns of performing an evaluation and management service by MyChart and the availability of in person appointments.  I also discussed with the patient that there may be a patient responsible charge related to this service. The patient expressed understanding and agreed to proceed.  Patient's Location: Home Physician Location: Clinic  CHIEF COMPLIANT: Follow-up of MGUS s/p bone marrow biopsy to discuss pathology   INTERVAL HISTORY: Brian Graham is a 69 y.o. male with above-mentioned history of MGUS. He underwent a bone marrow biopsy on 02/14/20. He presents over MyChart today to discuss the pathology report.  He did extremely well from the bone marrow biopsy and did not have any side effects.   Observations/Objective:  There were no vitals filed for this visit. There is no height or weight on file to calculate BMI.  I have reviewed the data as listed CMP Latest Ref Rng & Units 01/24/2020 10/04/2019 09/13/2019  Glucose 70 - 99 mg/dL 92 98 106(H)  BUN 8 - 23 mg/dL 18 23 26(H)  Creatinine 0.61 - 1.24 mg/dL 2.63(H) 2.49(H) 2.54(H)  Sodium 135 - 145 mmol/L 143 141 141  Potassium 3.5 - 5.1 mmol/L 4.0 4.2 4.7  Chloride 98 - 111 mmol/L 105 104 103  CO2 22 - 32 mmol/L 30 30 30   Calcium 8.9 - 10.3 mg/dL 8.8(L) 8.6 8.9  Total Protein 6.5 - 8.1 g/dL 6.7 - -  Total Bilirubin 0.3 - 1.2 mg/dL 0.6 - -  Alkaline Phos 38 - 126 U/L 66 - -  AST 15 - 41 U/L 18 - -  ALT 0 - 44 U/L 16 - -    Lab Results  Component Value Date   WBC 8.8 02/14/2020   HGB 10.2 (L) 02/14/2020   HCT 32.3 (L) 02/14/2020   MCV 94.4 02/14/2020   PLT 217 02/14/2020   NEUTROABS 6.4 02/14/2020      Assessment Plan:  MGUS (monoclonal  gammopathy of unknown significance) Elevated light chains: Kappa 80, K:L ratio 14, SPEP revealed no significant M protein. Normal hemoglobin, creatinine, calcium Bone survey: No lytic lesions X-ray of the shoulder reveal osteopenia. No clear lytic lesions. The entire work-up was initiated because patient fell down steps and hurt his left shoulder. X-ray of the shoulder revealed changes that were slightly concerning for myeloma although there were no lytic changes. This led to blood work that showed elevated kappa and that prompted the referral.  Lab review 01/24/2020: CBC is normal hemoglobin 10.1 Kappa 97, lambda 39, ratio 2.47 SPEP: No M protein, normal immunofixation Creatinine 2.63, calcium 8.8 ------------------------------------------------------------------------------------------------------------------- Bone marrow biopsy 02/14/2020: Hypocellular marrow (cellularity 5 to 10%) with trilineage hematopoiesis and maturation.  No increase plasmacytosis. Renal failure: Dr. Edrick Oh is his nephrologist.  We can clearly rule out multiple myeloma as a cause of his kidney disease based on normal bone marrow biopsy. Counseling: I discussed with the patient that based on the bone marrow biopsy there is no concern for multiple myeloma at this time.  We would like to see him once a year with blood work and follow-ups.    I discussed the assessment and treatment plan with the patient. The patient was provided an opportunity to ask questions and all were answered. The patient agreed with the  plan and demonstrated an understanding of the instructions. The patient was advised to call back or seek an in-person evaluation if the symptoms worsen or if the condition fails to improve as anticipated.   I provided 20 minutes of face-to-face MyChart video visit time during this encounter.    Rulon Eisenmenger, MD 02/28/2020   I, Molly Dorshimer, am acting as scribe for Nicholas Lose, MD.  I have reviewed  the above documentation for accuracy and completeness, and I agree with the above.

## 2020-02-28 ENCOUNTER — Inpatient Hospital Stay: Payer: Medicare HMO | Attending: Hematology and Oncology | Admitting: Hematology and Oncology

## 2020-02-28 DIAGNOSIS — D472 Monoclonal gammopathy: Secondary | ICD-10-CM | POA: Diagnosis not present

## 2020-02-28 NOTE — Assessment & Plan Note (Signed)
Elevated light chains: Kappa 80, K:L ratio 14, SPEP revealed no significant M protein. Normal hemoglobin, creatinine, calcium Bone survey: No lytic lesions X-ray of the shoulder reveal osteopenia. No clear lytic lesions. The entire work-up was initiated because patient fell down steps and hurt his left shoulder. X-ray of the shoulder revealed changes that were slightly concerning for myeloma although there were no lytic changes. This led to blood work that showed elevated kappa and that prompted the referral.  Lab review 01/24/2020: CBC is normal hemoglobin 10.1 Kappa 97, lambda 39, ratio 2.47 SPEP: No M protein, normal immunofixation Creatinine 2.63, calcium 8.8 ------------------------------------------------------------------------------------------------------------------- Bone marrow biopsy 02/14/2020: Hypercellular marrow (cellularity 5 to 10%) with trilineage hematopoiesis and maturation.  No increase plasmacytosis. Renal failure: Dr. Edrick Oh is his nephrologist.  We can clearly rule out multiple myeloma as a cause of his kidney disease based on normal bone marrow biopsy. Counseling: I discussed with the patient that based on the bone marrow biopsy there is no concern for multiple myeloma at this time.  We would like to see him once a year with blood work and follow-ups.

## 2020-03-04 ENCOUNTER — Other Ambulatory Visit: Payer: Self-pay | Admitting: Family Medicine

## 2020-03-05 ENCOUNTER — Encounter: Payer: Self-pay | Admitting: Family Medicine

## 2020-03-05 NOTE — Telephone Encounter (Signed)
[place] Database Verified LR: 02-01-2020 Qty: 63 Last office visit: 02-11-2020 Upcoming appointment: 03-06-2020 [facility] Wellness Visit)

## 2020-03-06 ENCOUNTER — Ambulatory Visit (INDEPENDENT_AMBULATORY_CARE_PROVIDER_SITE_OTHER): Payer: Medicare HMO

## 2020-03-06 ENCOUNTER — Other Ambulatory Visit: Payer: Self-pay

## 2020-03-06 DIAGNOSIS — Z0000 Encounter for general adult medical examination without abnormal findings: Secondary | ICD-10-CM | POA: Diagnosis not present

## 2020-03-06 NOTE — Patient Instructions (Addendum)
Brian Graham , Thank you for taking time to come for your Medicare Wellness Visit. I appreciate your ongoing commitment to your health goals. Please review the following plan we discussed and let me know if I can assist you in the future.   Screening recommendations/referrals: Colorectal Screening: up to date; last colonoscopy 07/05/12  Vision and Dental Exams: Recommended annual ophthalmology exams for early detection of glaucoma and other disorders of the eye Recommended annual dental exams for proper oral hygiene  Vaccinations: Influenza vaccine: completed 07/31/19 Pneumococcal vaccine: up to date; last 07/02/16 Tdap vaccine: up to date; last 10/06/14  Shingles vaccine: You may receive this vaccine at your local pharmacy. (see handout)  Covid vaccine:  Completed   Advanced directives:  I have provided a copy for you to complete at home and have notarized. Once this is complete please bring a copy in to our office so we can scan it into your chart.  Goals: Recommend to drink at least 6-8 8oz glasses of water per day and consume a balanced diet rich in fresh fruits and vegetables.   Next appointment: Please schedule your Annual Wellness Visit with your Nurse Health Advisor in one year.  I will be in touch with additional information discussed   Preventive Care 40-64 Years, Male Preventive care refers to lifestyle choices and visits with your health care provider that can promote health and wellness. What does preventive care include?  A yearly physical exam. This is also called an annual well check.  Dental exams once or twice a year.  Routine eye exams. Ask your health care provider how often you should have your eyes checked.  Personal lifestyle choices, including:  Daily care of your teeth and gums.  Regular physical activity.  Eating a healthy diet.  Avoiding tobacco and drug use.  Limiting alcohol use.  Practicing safe sex.  Taking low-dose aspirin every day starting  at age 53 if recommended by your health care provider.  Taking vitamin and mineral supplements as recommended by your health care provider. What happens during an annual well check? The services and screenings done by your health care provider during your annual well check will depend on your age, overall health, lifestyle risk factors, and family history of disease. Counseling  Your health care provider may ask you questions about your:  Alcohol use.  Tobacco use.  Drug use.  Emotional well-being.  Home and relationship well-being.  Sexual activity.  Eating habits.  Work and work Statistician. Screening  You may have the following tests or measurements:  Height, weight, and BMI.  Blood pressure.  Lipid and cholesterol levels. These may be checked every 5 years, or more frequently if you are over 15 years old.  Skin check.  Lung cancer screening. You may have this screening every year starting at age 108 if you have a 30-pack-year history of smoking and currently smoke or have quit within the past 15 years.  Fecal occult blood test (FOBT) of the stool. You may have this test every year starting at age 8.  Flexible sigmoidoscopy or colonoscopy. You may have a sigmoidoscopy every 5 years or a colonoscopy every 10 years starting at age 45.  Prostate cancer screening. Recommendations will vary depending on your family history and other risks.  Hepatitis C blood test.  Hepatitis B blood test.  Sexually transmitted disease (STD) testing.  Diabetes screening. This is done by checking your blood sugar (glucose) after you have not eaten for a while (fasting). You  may have this done every 1-3 years. Discuss your test results, treatment options, and if necessary, the need for more tests with your health care provider. Vaccines  Your health care provider may recommend certain vaccines, such as:  Influenza vaccine. This is recommended every year.  Tetanus, diphtheria, and  acellular pertussis (Tdap, Td) vaccine. You may need a Td booster every 10 years.  Zoster vaccine. You may need this after age 88.  Pneumococcal 13-valent conjugate (PCV13) vaccine. You may need this if you have certain conditions and have not been vaccinated.  Pneumococcal polysaccharide (PPSV23) vaccine. You may need one or two doses if you smoke cigarettes or if you have certain conditions. Talk to your health care provider about which screenings and vaccines you need and how often you need them. This information is not intended to replace advice given to you by your health care provider. Make sure you discuss any questions you have with your health care provider. Document Released: 12/05/2015 Document Revised: 07/28/2016 Document Reviewed: 09/09/2015 Elsevier Interactive Patient Education  2017 [place] Prevention in the Home Falls can cause injuries. They can happen to people of all ages. There are many things you can do to make your home safe and to help prevent falls. What can I do on the outside of my home?  Regularly fix the edges of walkways and driveways and fix any cracks.  Remove anything that might make you trip as you walk through a door, such as a raised step or threshold.  Trim any bushes or trees on the path to your home.  Use bright outdoor lighting.  Clear any walking paths of anything that might make someone trip, such as rocks or tools.  Regularly check to see if handrails are loose or broken. Make sure that both sides of any steps have handrails.  Any raised decks and porches should have guardrails on the edges.  Have any leaves, snow, or ice cleared regularly.  Use sand or salt on walking paths during winter.  Clean up any spills in your garage right away. This includes oil or grease spills. What can I do in the bathroom?  Use night lights.  Install grab bars by the toilet and in the tub and shower. Do not use towel bars as grab bars.  Use  non-skid mats or decals in the tub or shower.  If you need to sit down in the shower, use a plastic, non-slip stool.  Keep the floor dry. Clean up any water that spills on the floor as soon as it happens.  Remove soap buildup in the tub or shower regularly.  Attach bath mats securely with double-sided non-slip rug tape.  Do not have throw rugs and other things on the floor that can make you trip. What can I do in the bedroom?  Use night lights.  Make sure that you have a light by your bed that is easy to reach.  Do not use any sheets or blankets that are too big for your bed. They should not hang down onto the floor.  Have a firm chair that has side arms. You can use this for support while you get dressed.  Do not have throw rugs and other things on the floor that can make you trip. What can I do in the kitchen?  Clean up any spills right away.  Avoid walking on wet floors.  Keep items that you use a lot in easy-to-reach places.  If you need to  reach something above you, use a strong step stool that has a grab bar.  Keep electrical cords out of the way.  Do not use floor polish or wax that makes floors slippery. If you must use wax, use non-skid floor wax.  Do not have throw rugs and other things on the floor that can make you trip. What can I do with my stairs?  Do not leave any items on the stairs.  Make sure that there are handrails on both sides of the stairs and use them. Fix handrails that are broken or loose. Make sure that handrails are as long as the stairways.  Check any carpeting to make sure that it is firmly attached to the stairs. Fix any carpet that is loose or worn.  Avoid having throw rugs at the top or bottom of the stairs. If you do have throw rugs, attach them to the floor with carpet tape.  Make sure that you have a light switch at the top of the stairs and the bottom of the stairs. If you do not have them, ask someone to add them for you. What  else can I do to help prevent falls?  Wear shoes that:  Do not have high heels.  Have rubber bottoms.  Are comfortable and fit you well.  Are closed at the toe. Do not wear sandals.  If you use a stepladder:  Make sure that it is fully opened. Do not climb a closed stepladder.  Make sure that both sides of the stepladder are locked into place.  Ask someone to hold it for you, if possible.  Clearly mark and make sure that you can see:  Any grab bars or handrails.  First and last steps.  Where the edge of each step is.  Use tools that help you move around (mobility aids) if they are needed. These include:  Canes.  Walkers.  Scooters.  Crutches.  Turn on the lights when you go into a dark area. Replace any light bulbs as soon as they burn out.  Set up your furniture so you have a clear path. Avoid moving your furniture around.  If any of your floors are uneven, fix them.  If there are any pets around you, be aware of where they are.  Review your medicines with your doctor. Some medicines can make you feel dizzy. This can increase your chance of falling. Ask your doctor what other things that you can do to help prevent falls. This information is not intended to replace advice given to you by your health care provider. Make sure you discuss any questions you have with your health care provider. Document Released: 09/04/2009 Document Revised: 04/15/2016 Document Reviewed: 12/13/2014 Elsevier Interactive Patient Education  2017 [employer].

## 2020-03-06 NOTE — Progress Notes (Signed)
This visit is being conducted via phone call due to the COVID-19 pandemic. This patient has given me verbal consent via phone to conduct this visit, patient states they are participating from their home address. Some vital signs may be absent or patient reported.   Patient identification: identified by name, DOB, and current address.  Location provider: [facility], Office Persons participating in the virtual visit: Denman George LPN, patient, and Dr. Garret Reddish   Subjective:   Brian Graham is a 69 y.o. male who presents for an Initial Medicare Annual Wellness Visit.  Review of Systems   Cardiac Risk Factors include: advanced age (>21men, >24 women);male gender;hypertension;dyslipidemia    Objective:    There were no vitals filed for this visit. There is no height or weight on file to calculate BMI.  Advanced Directives 03/06/2020 12/05/2018 12/04/2018 11/10/2018 08/22/2018 10/07/2017 10/05/2017  Does Patient Have a Medical Advance Directive? No No No No No No No  Would patient like information on creating a medical advance directive? Yes (MAU/Ambulatory/Procedural Areas - Information given) No - Patient declined - No - Patient declined No - Patient declined No - Patient declined No - Patient declined    Current Medications (verified) Outpatient Encounter Medications as of 03/06/2020  Medication Sig  . acetaminophen (TYLENOL) 500 MG tablet Take 500-1,000 mg by mouth every 6 (six) hours as needed (for pain).   Marland Kitchen albuterol (PROVENTIL) (2.5 MG/3ML) 0.083% nebulizer solution Take 3 mLs (2.5 mg total) by nebulization every 6 (six) hours as needed for wheezing or shortness of breath. J44.9  . Albuterol Sulfate (PROAIR RESPICLICK) 163 (90 Base) MCG/ACT AEPB Inhale 2 puffs into the lungs every 4 (four) hours as needed.  Marland Kitchen aspirin EC 81 MG tablet Take 81 mg by mouth daily.  Marland Kitchen atenolol (TENORMIN) 50 MG tablet TAKE 1 TABLET DAILY  . budesonide-formoterol (SYMBICORT) 160-4.5 MCG/ACT  inhaler USE 2 INHALATIONS TWICE A DAY  . fluticasone (FLONASE) 50 MCG/ACT nasal spray Place 2 sprays into both nostrils daily.  . ondansetron (ZOFRAN-ODT) 4 MG disintegrating tablet PLACE 1 TO 2 TABLETS ON TONGUE AND ALLOW TO DISSOLVE EVERY 8 HOURS AS NEEDED FOR NAUSEA / VOMITING  . pantoprazole (PROTONIX) 20 MG tablet Take 1 tablet (20 mg total) by mouth daily before breakfast.  . rosuvastatin (CRESTOR) 40 MG tablet Take 40 mg by mouth daily.  Marland Kitchen SYNTHROID 112 MCG tablet TAKE 1 TABLET DAILY BEFORE BREAKFAST  . Tiotropium Bromide Monohydrate (SPIRIVA RESPIMAT) 2.5 MCG/ACT AERS USE 2 INHALATIONS DAILY  . traMADol (ULTRAM) 50 MG tablet TAKE 1 TABLET (50 MG TOTAL) BY MOUTH 2 (TWO) TIMES DAILY AS NEEDED FOR MODERATE PAIN OR SEVERE PAIN.   No facility-administered encounter medications on file as of 03/06/2020.    Allergies (verified) Contrast media [iodinated diagnostic agents], Other, Keflex [cephalexin], Sulfa antibiotics, Ciprofloxacin, Levaquin [levofloxacin], and Omeprazole   History: Past Medical History:  Diagnosis Date  . Anxiety   . Arthritis    "left knee" (08/05/2016)  . Bruises easily   . CAD (coronary artery disease) of artery bypass graft 08/05/2016   Around age 20. CABG - 3 vessel.   . Chronic kidney disease   . Colon polyp   . 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  Emphysema of lung ([place])   . Essential tremor 06/11/2016   Plans to see Dr. Carles Collet  . Former smoker 09/01/2016   Quit 2012. 40 pack years at least.   . GERD (gastroesophageal reflux disease)   . Headache   . History of blood transfusion 08/1997   "when he had his heart surgery"  . History of shingles 1970-2013 X 3  . HTN (hypertension) 08/05/2016     Amlodipine 5mg , atenolol 50mg  BID, benazepril 20mg   . Hyperlipemia   . Hypothyroidism   . Lumbar disc disease   . MGUS (monoclonal gammopathy of unknown significance)   . Myocardial infarction ([place]) 08/1997  . Peripheral vascular disease ([place])   . PONV (postoperative nausea and vomiting)   . Urinary tract infection 09/03/2017  . Wears glasses    Past Surgical History:  Procedure Laterality Date  . AORTOGRAM Right 08/31/2017   Procedure: AORTOGRAM BILATERAL PELVIC ANGIOGRAM WITH LEFT ILIAC ARTERY STENT;  Surgeon: Conrad Roann, MD;  Location: [facility];  Service: Vascular;  Laterality: Right;  . BACK SURGERY    . CARDIAC CATHETERIZATION  08/1997  . COLONOSCOPY  2014  . CORONARY ARTERY BYPASS GRAFT  09/12/1997   "triple"  . ESOPHAGOGASTRODUODENOSCOPY    . INGUINAL HERNIA REPAIR Right 1961  . KIDNEY REMOVED    . KNEE RECONSTRUCTION Left 1960s - 1974 X 4  . LAPAROSCOPIC ABLATION RENAL MASS  ~ 2014   "large abscess/pus ball"  . [place]   "they were wedged in the bowel"  . PATELLA FRACTURE SURGERY Left 1963  . PATELLA FRACTURE SURGERY Left ~ 1965   "removed knee cap"  . ROBOT ASSISTED LAPAROSCOPIC NEPHRECTOMY Left 10/07/2017   Procedure: XI ROBOTIC ASSISTED LAPAROSCOPIC NEPHRECTOMY OF PELVIC KIDNEY;  Surgeon: Alexis Frock, MD;  Location: [facility];  Service: Urology;  Laterality: Left;  Place robot patient tower at foot of bed like prostate per Dr. Tresa Moore. Pull extra staple loads.  . TONSILLECTOMY     Family History  Problem Relation Age of Onset  . Alcohol abuse Mother   . Alcohol abuse Father        not involved in life  . Alcoholism Sister   . Healthy Sister   . Healthy Sister   . Allergic rhinitis Neg Hx   . Angioedema Neg Hx   . Asthma Neg Hx   . Eczema Neg Hx   . Immunodeficiency Neg Hx   . Urticaria Neg Hx    Social History   Socioeconomic History  . Marital status: Married    Spouse name: Not on file  . Number of children: Not on file  .  Years of education: Not on file  . Highest education level: Not on file  Occupational History  . Occupation: Retired   Tobacco Use  . Smoking status: Former Smoker    Packs/day: 1.00    Years: 45.00    Pack years: 45.00    Types: Cigarettes    Quit date: 07/10/2017    Years since quitting: 2.6  . Smokeless tobacco: Never Used  Substance and Sexual Activity  . Alcohol use: Yes    Alcohol/week: 14.0 standard drinks    Types: 14 Shots of liquor per week    Comment: daily 2 shots of vodka  . Drug use: No  . Sexual activity: Not Currently  Other Topics Concern  . Not on file  Social History Narrative   Lives with wife. 2 adopted daughters from [place].  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 Difficulty of Paying Living Expenses:   Food Insecurity:   . Worried About Charity fundraiser in the Last Year:   . Arboriculturist in the Last Year:   Transportation Needs:   . Film/video editor (Medical):   Marland Kitchen Lack of Transportation (Non-Medical):   Physical Activity:   . Days of Exercise per Week:   . Minutes of Exercise per Session:   Stress:   . Feeling of Stress :   Social Connections:   . Frequency of Communication with Friends and Family:   . Frequency of Social Gatherings with Friends and Family:   . Attends Religious Services:   . Active Member of Clubs or Organizations:   . Attends Archivist Meetings:   Marland Kitchen Marital Status:    Tobacco Counseling Counseling given: Not Answered   Clinical Intake:  Pre-visit preparation completed: Yes  Pain : No/denies pain  Diabetes: No  How often do you need to have someone help you when you read instructions, pamphlets, or other written materials from your doctor or pharmacy?: 1 - Never  Interpreter Needed?: No  Information  entered by :: Denman George LPN  Activities of Daily Living In your present state of health, do you have any difficulty performing the following activities: 03/06/2020  Hearing? N  Vision? N  Difficulty concentrating or making decisions? N  Walking or climbing stairs? N  Comment fatigues easily  Dressing or bathing? N  Doing errands, shopping? N  Preparing Food and eating ? N  Using the Toilet? N  In the past six months, have you accidently leaked urine? N  Do you have problems with loss of bowel control? N  Managing your Medications? N  Managing your Finances? N  Housekeeping or managing your Housekeeping? N  Some recent data might be hidden     Immunizations and Health Maintenance Immunization History  Administered Date(s) Administered  . Fluad Quad(high Dose 65+) 07/31/2019  . Influenza Split 08/30/2013, 08/18/2016  . Influenza, High Dose Seasonal PF 09/14/2017  . Influenza,inj,Quad PF,6+ Mos 07/23/2016  . Influenza,inj,quad, With Preservative 07/23/2016  . Influenza-Unspecified 09/01/2018  . PFIZER SARS-COV-2 Vaccination 12/18/2019, 01/08/2020  . Pneumococcal Conjugate-13 04/03/2013  . Pneumococcal Polysaccharide-23 06/10/2016  . Pneumococcal-Unspecified 04/03/2013, 07/02/2016  . Tdap 06/11/2007, 10/06/2014   There are no preventive care reminders to display for this patient.  Patient Care Team: [name], MD as PCP - General (Family Medicine) Juanito Doom, MD as Consulting Physician (Pulmonary Disease) Minus Breeding, MD as Consulting Physician (Cardiology) Lyndee Hensen, PT as Physical Therapist (Physical Therapy) Edrick Oh, MD as Consulting Physician (Nephrology) Nicholas Lose, MD as Consulting Physician (Hematology and Oncology) Izora Gala, MD as Consulting Physician (Otolaryngology) Gatha Mayer, MD as Consulting Physician (Gastroenterology)  Indicate any recent Medical Services you may have received from other than [facility] providers in  the past year (date may be approximate).    Assessment:   This is a routine wellness examination for [place].  Hearing/Vision screen No exam data present  Dietary issues and exercise activities discussed: Current Exercise Habits: The patient does not participate in regular exercise at present  Goals   None    Depression Screen PHQ  2/9 Scores 03/06/2020 02/11/2020 08/13/2019 11/28/2018  PHQ - 2 Score 3 3 1  0  PHQ- 9 Score 5 5 - -    Fall Risk Fall Risk  03/06/2020 08/13/2019 02/07/2019 09/03/2017 08/23/2016  Falls in the past year? 0 0 0 No No  Number falls in past yr: 0 0 - - -  Injury with Fall? 0 0 - - -  Follow up Falls evaluation completed;Education provided;Falls prevention discussed - - - -    Is the patient's home free of loose throw rugs in walkways, pet beds, electrical cords, etc?   yes      Grab bars in the bathroom? yes      Handrails on the stairs?   yes      Adequate lighting?   yes   Cognitive Function: no cognitive concerns at this time    6CIT Screen 03/06/2020  What Year? 0 points  What month? 0 points  What time? 0 points  Count back from 20 0 points  Months in reverse 0 points  Repeat phrase 0 points  Total Score 0    Screening Tests Health Maintenance  Topic Date Due  . DEXA SCAN  06/21/2020  . INFLUENZA VACCINE  06/22/2020  . COLONOSCOPY  07/05/2022  . TETANUS/TDAP  10/06/2024  . Hepatitis C Screening  Completed  . PNA vac Low Risk Adult  Completed    Qualifies for Shingles Vaccine? Discussed and patient will check with pharmacy for coverage.  Patient education handout provided   Cancer Screenings: Lung: Low Dose CT Chest recommended if Age 34-80 years, 30 pack-year currently smoking OR have quit w/in 15years. Patient does not qualify. Colorectal: colonoscopy 07/05/12     Plan:  I have personally reviewed and addressed the Medicare Annual Wellness questionnaire and have noted the following in the patient's chart:  A. Medical and social  history B. Use of alcohol, tobacco or illicit drugs  C. Current medications and supplements D. Functional ability and status E.  Nutritional status F.  Physical activity G. Advance directives H. List of other physicians I.  Hospitalizations, surgeries, and ER visits in previous 12 months J.  [place] such as hearing and vision if needed, cognitive and depression L. Referrals, records requested, and appointments- none   In addition, I have reviewed and discussed with patient certain preventive protocols, quality metrics, and best practice recommendations. A written personalized care plan for preventive services as well as general preventive health recommendations were provided to patient.   Signed,  Denman George, LPN  Nurse Health Advisor   Nurse Notes: Information to be provided to patient on reporting possible Covid vaccine side effects/ reactions.  Patient would also like to know if there are any supplements that help improve kidney function.   Also complaining of muscle spasms at night that are causing disruptions with sleep.  Would like to know if it would be indicated for him to be prescribed a prn muscle relaxer.

## 2020-03-07 ENCOUNTER — Other Ambulatory Visit: Payer: Self-pay

## 2020-03-07 ENCOUNTER — Other Ambulatory Visit: Payer: Self-pay | Admitting: Otolaryngology

## 2020-03-07 DIAGNOSIS — H9122 Sudden idiopathic hearing loss, left ear: Secondary | ICD-10-CM

## 2020-03-07 MED ORDER — ALBUTEROL SULFATE HFA 108 (90 BASE) MCG/ACT IN AERS: 2.0000 | INHALATION_SPRAY | RESPIRATORY_TRACT | 3 refills | Status: DC | PRN

## 2020-03-12 ENCOUNTER — Other Ambulatory Visit: Payer: Self-pay | Admitting: Internal Medicine

## 2020-03-12 MED ORDER — FAMOTIDINE 20 MG PO TABS: 20.0000 mg | ORAL_TABLET | Freq: Two times a day (BID) | ORAL | 1 refills | Status: DC

## 2020-03-14 ENCOUNTER — Telehealth: Payer: Self-pay

## 2020-03-14 NOTE — Telephone Encounter (Signed)
[facility] called in stating that patient's insurance will not cover VENTOLIN HFA 108, preferred cover alternative is [employer], OK to switch? [phone] , ref # [id number]

## 2020-03-15 NOTE — Telephone Encounter (Signed)
Yes thanks-okay to switch

## 2020-03-24 ENCOUNTER — Other Ambulatory Visit: Payer: Self-pay

## 2020-03-24 ENCOUNTER — Ambulatory Visit
Admission: RE | Admit: 2020-03-24 | Discharge: 2020-03-24 | Disposition: A | Discharge status: Home / Self Care | Payer: Medicare HMO | Source: Ambulatory Visit | Attending: Otolaryngology | Admitting: Otolaryngology

## 2020-03-24 DIAGNOSIS — H9122 Sudden idiopathic hearing loss, left ear: Secondary | ICD-10-CM

## 2020-03-24 LAB — BASIC METABOLIC PANEL
BUN: 24 — AB (ref 4–21)
CO2: 23 — AB (ref 13–22)
Chloride: 104 (ref 99–108)
Creatinine: 2.6 — AB (ref 0.6–1.3)
Glucose: 103
Sodium: 142 (ref 137–147)

## 2020-03-24 LAB — CBC AND DIFFERENTIAL: Hemoglobin: 11 — AB (ref 13.5–17.5)

## 2020-03-24 LAB — COMPREHENSIVE METABOLIC PANEL
Albumin: 4.2 (ref 3.5–5.0)
Calcium: 9.5 (ref 8.7–10.7)
GFR calc Af Amer: 28
GFR calc non Af Amer: 24

## 2020-03-26 ENCOUNTER — Other Ambulatory Visit: Payer: Self-pay

## 2020-03-26 ENCOUNTER — Encounter: Payer: Self-pay | Admitting: Family Medicine

## 2020-03-26 MED ORDER — ONDANSETRON 4 MG PO TBDP: ORAL_TABLET | ORAL | 1 refills | Status: DC

## 2020-03-30 ENCOUNTER — Other Ambulatory Visit: Payer: Self-pay | Admitting: Pulmonary Disease

## 2020-04-02 ENCOUNTER — Telehealth: Payer: Self-pay | Admitting: Pulmonary Disease

## 2020-04-02 NOTE — Telephone Encounter (Signed)
Pt is a former BQ pt last seen by him 09/25/18.  Pt will need an appt prior to [place] refilling med.  Called and spoke with pt about this and have scheduled him an appt tomorrow 5/13 at [place] with Aaron Edelman. Nothing further needed.

## 2020-04-03 ENCOUNTER — Ambulatory Visit: Payer: Medicare HMO | Admitting: Pulmonary Disease

## 2020-04-03 ENCOUNTER — Encounter: Payer: Self-pay | Admitting: Pulmonary Disease

## 2020-04-03 ENCOUNTER — Other Ambulatory Visit: Payer: Self-pay

## 2020-04-03 VITALS — BP 118/74 | HR 56 | Temp 97.7°F | Ht 69.0 in | Wt 142.2 lb

## 2020-04-03 DIAGNOSIS — J449 Chronic obstructive pulmonary disease, unspecified: Secondary | ICD-10-CM

## 2020-04-03 DIAGNOSIS — J3089 Other allergic rhinitis: Secondary | ICD-10-CM | POA: Diagnosis not present

## 2020-04-03 DIAGNOSIS — Z87891 Personal history of nicotine dependence: Secondary | ICD-10-CM | POA: Diagnosis not present

## 2020-04-03 DIAGNOSIS — H93232 Hyperacusis, left ear: Secondary | ICD-10-CM | POA: Diagnosis not present

## 2020-04-03 DIAGNOSIS — Z0000 Encounter for general adult medical examination without abnormal findings: Secondary | ICD-10-CM

## 2020-04-03 DIAGNOSIS — N184 Chronic kidney disease, stage 4 (severe): Secondary | ICD-10-CM

## 2020-04-03 MED ORDER — SPIRIVA RESPIMAT 2.5 MCG/ACT IN AERS: INHALATION_SPRAY | RESPIRATORY_TRACT | 3 refills | Status: DC

## 2020-04-03 NOTE — Assessment & Plan Note (Signed)
Plan: Continue follow-up with Dr. Constance Holster

## 2020-04-03 NOTE — Patient Instructions (Addendum)
You were seen today by Lauraine Rinne, NP  for:   1. COPD, severe (HCC)  Continue Symbicort 160 >>> 2 puffs in the morning right when you wake up, rinse out your mouth after use, 12 hours later 2 puffs, rinse after use >>> Take this daily, no matter what >>> This is not a rescue inhaler   Spiriva Respimat 2.5 >>> 2 puffs daily >>> Do this every day >>>This is not a rescue inhaler  Note your daily symptoms > remember "red flags" for COPD:   >>>Increase in cough >>>increase in sputum production >>>increase in shortness of breath or activity  intolerance.   If you notice these symptoms, please call the office to be seen.    2. Perennial allergic rhinitis  Continue Flonase  3. Hearing abnormally acute, left  Keep follow-up with Dr. Constance Holster  4. Former smoker  Continue to receive lung cancer screening CTs  Next is due in November/2021  5. Healthcare maintenance  Received flu vaccine in the fall   Follow Up:    Return in about 1 year (around 04/03/2021), or if symptoms worsen or fail to improve, for Follow up with Dr. Valeta Harms, [place].   Please do your part to reduce the spread of COVID-19:      Reduce your risk of any infection  and COVID19 by using the similar precautions used for avoiding the common cold or flu:  Marland Kitchen Wash your hands often with soap and warm water for at least 20 seconds.  If soap and water are not readily available, use an alcohol-based hand sanitizer with at least 60% alcohol.  . If coughing or sneezing, cover your mouth and nose by coughing or sneezing into the elbow areas of your shirt or coat, into a tissue or into your sleeve (not your hands). Langley Gauss A MASK when in public  . Avoid shaking hands with others and consider head nods or verbal greetings only. . Avoid touching your eyes, nose, or mouth with unwashed hands.  . Avoid close contact with people who are sick. . Avoid places or events with large numbers of people in one location, like  concerts or sporting events. . If you have some symptoms but not all symptoms, continue to monitor at home and seek medical attention if your symptoms worsen. . If you are having a medical emergency, call 911.   [place] / e-Visit: [email or web address]         [facility]: [facility]: [phone]                   [facility]: [phone]     It is flu season:   >>> Best ways to protect herself from the flu: Receive the yearly flu vaccine, practice good hand hygiene washing with soap and also using hand sanitizer when available, eat a nutritious meals, get adequate rest, hydrate appropriately   Please contact the office if your symptoms worsen or you have concerns that you are not improving.   Thank you for choosing [facility] for your healthcare, and for allowing [place] to partner with you on your healthcare journey. I am thankful to be able to provide care to you today.   Wyn Quaker FNP-C    COPD and Physical Activity Chronic obstructive pulmonary disease (COPD) is a long-term (chronic) condition that affects the lungs. COPD is a general term that can be  used to describe many different lung problems that cause lung swelling (inflammation) and limit airflow, including chronic bronchitis and emphysema. The main symptom of COPD is shortness of breath, which makes it harder to do even simple tasks. This can also make it harder to exercise and be active. Talk with your health care provider about treatments to help you breathe better and actions you can take to prevent breathing problems during physical activity. What are the benefits of exercising with COPD? Exercising regularly is an important part of a healthy lifestyle. You can still exercise and do physical activities even though you have COPD. Exercise and physical  activity improve your shortness of breath by increasing blood flow (circulation). This causes your heart to pump more oxygen through your body. Moderate exercise can improve your:  Oxygen use.  Energy level.  Shortness of breath.  Strength in your breathing muscles.  Heart health.  Sleep.  Self-esteem and feelings of self-worth.  Depression, stress, and anxiety levels. Exercise can benefit everyone with COPD. The severity of your disease may affect how hard you can exercise, especially at first, but everyone can benefit. Talk with your health care provider about how much exercise is safe for you, and which activities and exercises are safe for you. What actions can I take to prevent breathing problems during physical activity?  Sign up for a pulmonary rehabilitation program. This type of program may include: ? Education about lung diseases. ? Exercise classes that teach you how to exercise and be more active while improving your breathing. This usually involves:  Exercise using your lower extremities, such as a stationary bicycle.  About 30 minutes of exercise, 2 to 5 times per week, for 6 to 12 weeks  Strength training, such as push ups or leg lifts. ? Nutrition education. ? Group classes in which you can talk with others who also have COPD and learn ways to manage stress.  If you use an oxygen tank, you should use it while you exercise. Work with your health care provider to adjust your oxygen for your physical activity. Your resting flow rate is different from your flow rate during physical activity.  While you are exercising: ? Take slow breaths. ? Pace yourself and do not try to go too fast. ? Purse your lips while breathing out. Pursing your lips is similar to a kissing or whistling position. ? If doing exercise that uses a quick burst of effort, such as weight lifting:  Breathe in before starting the exercise.  Breathe out during the hardest part of the exercise (such  as raising the weights). Where to find support You can find support for exercising with COPD from:  Your health care provider.  A pulmonary rehabilitation program.  Your local health department or community health programs.  Support groups, online or in-person. Your health care provider may be able to recommend support groups. Where to find more information You can find more information about exercising with COPD from:  American Lung Association: ClassInsider.se.  COPD Foundation: https://www.rivera.net/. Contact a health care provider if:  Your symptoms get worse.  You have chest pain.  You have nausea.  You have a fever.  You have trouble talking or catching your breath.  You want to start a new exercise program or a new activity. Summary  COPD is a general term that can be used to describe many different lung problems that cause lung swelling (inflammation) and limit airflow. This includes chronic bronchitis and emphysema.  Exercise and  physical activity improve your shortness of breath by increasing blood flow (circulation). This causes your heart to provide more oxygen to your body.  Contact your health care provider before starting any exercise program or new activity. Ask your health care provider what exercises and activities are safe for you. This information is not intended to replace advice given to you by your health care provider. Make sure you discuss any questions you have with your health care provider. Document Revised: 02/28/2019 Document Reviewed: 12/01/2017 Elsevier Patient Education  2020 [employer].

## 2020-04-03 NOTE — Assessment & Plan Note (Signed)
Followed by Dr. Justin Mend  Plan: Keep follow-up with nephrology

## 2020-04-03 NOTE — Progress Notes (Signed)
@Patient  ID: Brian Graham, male    DOB: 10-02-1951, 69 y.o.   MRN: [id number]  Chief Complaint  Patient presents with  . Follow-up    F/U for spiriva refills. States his breathing has been great since last visit.     Referring provider: [name], MD  HPI:  69 year old male former smoker followed in our office for COPD  PMH: Tremor, headache, CAD, hypertension, pyelonephritis, arthrosclerosis, anxiety, chronic kidney disease stage IV Smoker/ Smoking History: Former smoker.  Quit 2018.  45-pack-year smoking history Maintenance: Symbicort 160, Spiriva Respimat 2.5 Pt of: Former patient of Dr. Lake Bells  04/03/2020  - Visit   69 year old male former smoker followed in our office for COPD.  Last seen in our office last seen in our office in June/2020 with SG NP.  At that time he was encouraged to remain on his inhalers.  Follow-up in 1 year.  Patient is presenting today for follow-up.  Patient does need to be established with new pulmonologist as he he is a former patient of Dr. Lake Bells.  Patient presenting to office today as a 1 year follow-up. He needs refills of his inhalers. We will coordinate this today. He reports that his breathing is at baseline. He has no acute concerns regarding his breathing today. He is scheduled to have a repeat lung cancer screening CT in November/2021. He has received his Covid vaccines. Unfortunately patient reports that he developed acute left ear hearing loss. He is established with Dr. Constance Holster with [facility]. He is keeping follow-up with him for this evaluation. He received a steroid shot without much relief.  Questionaires / Pulmonary Flowsheets:   MMRC: mMRC Dyspnea Scale mMRC Score  04/03/2020 0   Tests:   02/14/2020-eosinophils relative 3, eosinophils absolute 0.3  06/28/2019-CT chest lung cancer screening-moderate centrilobular and paraseptal emphysema, lung RADS 4 a suspicious  10/01/2019-CT chest lung cancer screening nodule  follow-up-lung RADS 2, benign appearance, continue annual screening  01/24/2018-spirometry-FVC 3.59 (92% predicted), ratio 0.36 (48% predicted), FEV1 1.31 (45% predicted)  FENO:  No results found for: NITRICOXIDE  PFT: No flowsheet data found.  WALK:  SIX MIN WALK 09/01/2016  Supplimental Oxygen during Test? (L/min) No  Tech Comments: pt walked a moderate pace, tolerated walk well.     Imaging: CT TEMPORAL BONES WO CONTRAST  Result Date: 03/24/2020 CLINICAL DATA:  Sudden left hearing loss. Additional history provided: Patient reports left ear hearing loss status post COVID vaccine, symptoms for 2 weeks, no prior surgery. EXAM: CT TEMPORAL BONES WITHOUT CONTRAST TECHNIQUE: Axial and coronal plane CT imaging of the petrous temporal bones was performed with thin-collimation image reconstruction. No intravenous contrast was administered. Multiplanar CT image reconstructions were also generated. COMPARISON:  Brain MRI 06/30/2016 FINDINGS: RIGHT:The external auditory canal is patent.The middle ear cavity is well-aerated.The ossicles are unremarkable.The inner ear structures, internal auditory and facial nerve canals are normal.The mastoid air cells are well-aerated. LEFT:The external auditory canal is patent.The middle ear cavity is well-aerated.The ossicles are unremarkable.The inner ear structures, internal auditory and facial nerve canals are normal.The mastoid air cells are well-aerated. Mild ethmoid and left sphenoid sinus mucosal thickening. The imaged orbits demonstrate no acute abnormality. IMPRESSION: Unremarkable CT of the bilateral temporal bones. Mild ethmoid and left sphenoid sinus mucosal thickening. Electronically Signed   By: Kellie Simmering DO   On: 03/24/2020 16:54    Lab Results:  CBC    Component Value Date/Time   WBC 8.8 02/14/2020 0833   WBC 10.9 (H)  08/13/2019 1026   RBC 3.42 (L) 02/14/2020 0833   HGB 11.0 (A) 03/24/2020 0000   HGB 10.2 (L) 02/14/2020 0833   HCT 32.3 (L)  02/14/2020 0833   PLT 217 02/14/2020 0833   MCV 94.4 02/14/2020 0833   MCH 29.8 02/14/2020 0833   MCHC 31.6 02/14/2020 0833   RDW 13.2 02/14/2020 0833   LYMPHSABS 1.3 02/14/2020 0833   MONOABS 0.8 02/14/2020 0833   EOSABS 0.3 02/14/2020 0833   BASOSABS 0.0 02/14/2020 0833    BMET    Component Value Date/Time   NA 142 03/24/2020 0000   K 4.2 02/12/2020 0000   CL 104 03/24/2020 0000   CO2 23 (A) 03/24/2020 0000   GLUCOSE 92 01/24/2020 1057   BUN 24 (A) 03/24/2020 0000   CREATININE 2.6 (A) 03/24/2020 0000   CREATININE 2.63 (H) 01/24/2020 1057   CALCIUM 9.5 03/24/2020 0000   GFRNONAA 24 03/24/2020 0000   GFRNONAA 24 (L) 01/24/2020 1057   GFRAA 28 03/24/2020 0000   GFRAA 28 (L) 01/24/2020 1057    BNP    Component Value Date/Time   BNP 209.6 (H) 07/10/2017 2310    ProBNP No results found for: PROBNP  Specialty Problems      Pulmonary Problems   COPD, severe ([place])    Alpha 1 studies normal per prior pulmonary group notes Smoked 40 years, quit in 2012 June 2016 PFT from prior pulmonary group: "significant obstruction" FEV1 1.58L (47% pred), Residual volume 171% pred, DLCO 59% pred Simple Spirometry>> 09/15/2016 ratio 42% FEV1 1.02 L / 31%       Perennial allergic rhinitis      Allergies  Allergen Reactions  . Contrast Media [Iodinated Diagnostic Agents] Other (See Comments)    NOT ALLOWED DUE TO KIDNEY ISSUES.  . Other Itching and Rash    Gel used for ultrasound SEVERELY broke out the skin   . Keflex [Cephalexin] Diarrhea, Nausea Only and Other (See Comments)    Insomnia  . Sulfa Antibiotics Other (See Comments)    Insomnia  . Ciprofloxacin Diarrhea  . Levaquin [Levofloxacin] Other (See Comments)    Insomnia   . Omeprazole Diarrhea    Immunization History  Administered Date(s) Administered  . Fluad Quad(high Dose 65+) 07/31/2019  . Influenza Split 08/30/2013, 08/18/2016  . Influenza, High Dose Seasonal PF 09/14/2017  . Influenza,inj,Quad PF,6+ Mos  07/23/2016  . Influenza,inj,quad, With Preservative 07/23/2016  . Influenza-Unspecified 09/01/2018  . PFIZER SARS-COV-2 Vaccination 12/18/2019, 01/08/2020  . Pneumococcal Conjugate-13 04/03/2013  . Pneumococcal Polysaccharide-23 06/10/2016  . Pneumococcal-Unspecified 04/03/2013, 07/02/2016  . Tdap 06/11/2007, 10/06/2014    Past Medical History:  Diagnosis Date  . Anxiety   . Arthritis    "left knee" (08/05/2016)  . Bruises easily   . CAD (coronary artery disease) of artery bypass graft 08/05/2016   Around age 84. CABG - 3 vessel.   . Chronic kidney disease   . Colon polyp   . COPD, severe ([place]) 08/23/2016   Alpha 1 studies normal per prior pulmonary group notes Smoked 40 years, quit in 2012 June 2016 PFT from prior pulmonary group: "significant obstruction" FEV1 1.58L (47% pred), Residual volume 171% pred, DLCO 59% pred Simple Spirometry>> 09/15/2016 ratio 42% FEV1 1.02 L / 31%   . Emphysema of lung ([place])   . Essential tremor 06/11/2016   Plans to see Dr. Carles Collet  . Former smoker 09/01/2016   Quit 2012. 40 pack years at least.   . GERD (gastroesophageal reflux disease)   .  Headache   . History of blood transfusion 08/1997   "when he had his heart surgery"  . History of shingles 1970-2013 X 3  . HTN (hypertension) 08/05/2016   Amlodipine 5mg , atenolol 50mg  BID, benazepril 20mg   . Hyperlipemia   . Hypothyroidism   . Lumbar disc disease   . MGUS (monoclonal gammopathy of unknown significance)   . Myocardial infarction ([place]) 08/1997  . Peripheral vascular disease ([place])   . PONV (postoperative nausea and vomiting)   . Urinary tract infection 09/03/2017  . Wears glasses     Tobacco History: Social History   Tobacco Use  Smoking Status Former Smoker  . Packs/day: 1.00  . Years: 45.00  . Pack years: 45.00  . Types: Cigarettes  . Quit date: 07/10/2017  . Years since quitting: 2.7  Smokeless Tobacco Never Used   Counseling given: Yes   Continue to not smoke  Outpatient  Encounter Medications as of 04/03/2020  Medication Sig  . acetaminophen (TYLENOL) 500 MG tablet Take 500-1,000 mg by mouth every 6 (six) hours as needed (for pain).   Marland Kitchen albuterol (PROVENTIL) (2.5 MG/3ML) 0.083% nebulizer solution Take 3 mLs (2.5 mg total) by nebulization every 6 (six) hours as needed for wheezing or shortness of breath. J44.9  . albuterol (VENTOLIN HFA) 108 (90 Base) MCG/ACT inhaler Inhale 2 puffs into the lungs every 4 (four) hours as needed for wheezing or shortness of breath.  Marland Kitchen aspirin EC 81 MG tablet Take 81 mg by mouth daily.  Marland Kitchen atenolol (TENORMIN) 50 MG tablet TAKE 1 TABLET DAILY  . budesonide-formoterol (SYMBICORT) 160-4.5 MCG/ACT inhaler USE 2 INHALATIONS TWICE A DAY  . famotidine (PEPCID) 20 MG tablet Take 1 tablet (20 mg total) by mouth 2 (two) times daily.  . fluticasone (FLONASE) 50 MCG/ACT nasal spray Place 2 sprays into both nostrils daily.  . ondansetron (ZOFRAN-ODT) 4 MG disintegrating tablet PLACE 1 TO 2 TABLETS ON TONGUE AND ALLOW TO DISSOLVE EVERY 8 HOURS AS NEEDED FOR NAUSEA / VOMITING  . rosuvastatin (CRESTOR) 40 MG tablet Take 40 mg by mouth daily.  Marland Kitchen SYNTHROID 112 MCG tablet TAKE 1 TABLET DAILY BEFORE BREAKFAST  . Tiotropium Bromide Monohydrate (SPIRIVA RESPIMAT) 2.5 MCG/ACT AERS USE 2 INHALATIONS DAILY  . traMADol (ULTRAM) 50 MG tablet TAKE 1 TABLET (50 MG TOTAL) BY MOUTH 2 (TWO) TIMES DAILY AS NEEDED FOR MODERATE PAIN OR SEVERE PAIN.  . [DISCONTINUED] Tiotropium Bromide Monohydrate (SPIRIVA RESPIMAT) 2.5 MCG/ACT AERS USE 2 INHALATIONS DAILY   No facility-administered encounter medications on file as of 04/03/2020.     Review of Systems  Review of Systems  Constitutional: Negative for activity change, chills, fatigue, fever and unexpected weight change.  HENT: Positive for hearing loss (left ear - follows by Dr. Constance Holster ENT ). Negative for postnasal drip, rhinorrhea, sinus pressure, sinus pain and sore throat.   Eyes: Negative.   Respiratory: Negative  for cough, shortness of breath and wheezing.   Cardiovascular: Negative for chest pain and palpitations.  Gastrointestinal: Negative for constipation, diarrhea, nausea and vomiting.  Endocrine: Negative.   Genitourinary: Negative.   Musculoskeletal: Negative.   Skin: Negative.   Neurological: Negative for dizziness and headaches.  Psychiatric/Behavioral: Negative.  Negative for dysphoric mood. The patient is not nervous/anxious.   All other systems reviewed and are negative.    Physical Exam  BP 118/74   Pulse (!) 56   Temp 97.7 F (36.5 C) (Temporal)   Ht 5\' 9"  (1.753 m)   Wt 142 lb 3.2 oz (  64.5 kg)   SpO2 97% Comment: on RA  BMI 21.00 kg/m   Wt Readings from Last 5 Encounters:  04/03/20 142 lb 3.2 oz (64.5 kg)  02/11/20 145 lb 9.6 oz (66 kg)  02/05/20 147 lb (66.7 kg)  01/31/20 145 lb 3.2 oz (65.9 kg)  12/25/19 144 lb (65.3 kg)    BMI Readings from Last 5 Encounters:  04/03/20 21.00 kg/m  02/11/20 21.50 kg/m  02/05/20 21.71 kg/m  01/31/20 21.44 kg/m  12/25/19 21.27 kg/m     Physical Exam Vitals and nursing note reviewed.  Constitutional:      General: He is not in acute distress.    Appearance: Normal appearance. He is normal weight.  HENT:     Head: Normocephalic and atraumatic.     Right Ear: Hearing, tympanic membrane, ear canal and external ear normal. There is no impacted cerumen.     Left Ear: Hearing, tympanic membrane, ear canal and external ear normal. There is no impacted cerumen.     Nose: Nose normal. No mucosal edema, congestion or rhinorrhea.     Right Turbinates: Not enlarged.     Left Turbinates: Not enlarged.     Mouth/Throat:     Mouth: Mucous membranes are dry.     Pharynx: Oropharynx is clear. No oropharyngeal exudate.  Eyes:     Pupils: Pupils are equal, round, and reactive to light.  Cardiovascular:     Rate and Rhythm: Normal rate and regular rhythm.     Pulses: Normal pulses.     Heart sounds: Normal heart sounds. No murmur.    Pulmonary:     Effort: Pulmonary effort is normal.     Breath sounds: Normal breath sounds. No decreased breath sounds, wheezing or rales.  Musculoskeletal:     Cervical back: Normal range of motion.     Right lower leg: No edema.     Left lower leg: No edema.  Lymphadenopathy:     Cervical: No cervical adenopathy.  Skin:    General: Skin is warm and dry.     Capillary Refill: Capillary refill takes less than 2 seconds.     Findings: No erythema or rash.  Neurological:     General: No focal deficit present.     Mental Status: He is alert and oriented to person, place, and time.     Motor: No weakness.     Coordination: Coordination normal.     Gait: Gait is intact. Gait normal.  Psychiatric:        Mood and Affect: Mood normal.        Behavior: Behavior normal. Behavior is cooperative.        Thought Content: Thought content normal.        Judgment: Judgment normal.       Assessment & Plan:   Chronic kidney disease (CKD), stage IV (severe) (HCC) Followed by Dr. Justin Mend  Plan: Keep follow-up with nephrology  COPD, severe ([place]) Stable interval for patient mMRC 0 today Remaining active  Plan: Continue Symbicort 160 Continue Spiriva Respimat 2.5 Offered trial of Breztri today, patient declined Follow-up in 1 year to establish care with Dr. Valeta Harms in a 30-minute time slot Complete lung cancer screening CT as scheduled in [place] maintenance Plan: Received flu vaccine in fall/2021  Former smoker Plan: Complete lung cancer screening CT in November/2021 as planned  Hearing abnormally acute, left Plan: Continue follow-up with Dr. Constance Holster    Return in about 1 year (around 04/03/2021), or  if symptoms worsen or fail to improve, for Follow up with Dr. Valeta Harms, [place].   Lauraine Rinne, NP 04/03/2020   This appointment required 32 minutes of patient care (this includes precharting, chart review, review of results, face-to-face care, etc.).

## 2020-04-03 NOTE — Assessment & Plan Note (Signed)
Stable interval for patient mMRC 0 today Remaining active  Plan: Continue Symbicort 160 Continue Spiriva Respimat 2.5 Offered trial of Breztri today, patient declined Follow-up in 1 year to establish care with Dr. Valeta Harms in a 30-minute time slot Complete lung cancer screening CT as scheduled in November/2021

## 2020-04-03 NOTE — Assessment & Plan Note (Signed)
Plan: Complete lung cancer screening CT in November/2021 as planned

## 2020-04-03 NOTE — Assessment & Plan Note (Signed)
Plan: Received flu vaccine in fall/2021

## 2020-04-23 ENCOUNTER — Other Ambulatory Visit: Payer: Self-pay

## 2020-04-23 MED ORDER — FAMOTIDINE 20 MG PO TABS: 20.0000 mg | ORAL_TABLET | Freq: Two times a day (BID) | ORAL | 3 refills | Status: DC

## 2020-06-10 ENCOUNTER — Encounter: Payer: Self-pay | Admitting: Family Medicine

## 2020-06-10 ENCOUNTER — Other Ambulatory Visit: Payer: Self-pay

## 2020-06-10 MED ORDER — ONDANSETRON 4 MG PO TBDP: ORAL_TABLET | ORAL | 1 refills | Status: DC

## 2020-07-11 ENCOUNTER — Encounter: Payer: Self-pay | Admitting: Family Medicine

## 2020-07-11 MED ORDER — ONDANSETRON 4 MG PO TBDP: ORAL_TABLET | ORAL | 1 refills | Status: DC

## 2020-07-11 NOTE — Telephone Encounter (Signed)
Pt notified Rx sent to pharmacy for Zofran as requested.

## 2020-08-13 ENCOUNTER — Other Ambulatory Visit: Payer: Self-pay

## 2020-08-13 ENCOUNTER — Encounter: Payer: Self-pay | Admitting: Family Medicine

## 2020-08-13 MED ORDER — ONDANSETRON 4 MG PO TBDP: ORAL_TABLET | ORAL | 1 refills | Status: DC

## 2020-08-22 ENCOUNTER — Other Ambulatory Visit: Payer: Self-pay

## 2020-08-22 ENCOUNTER — Emergency Department (HOSPITAL_COMMUNITY)
Admission: EM | Admit: 2020-08-22 | Discharge: 2020-08-22 | Disposition: A | Discharge status: Home / Self Care | Payer: Medicare HMO | Attending: Emergency Medicine | Admitting: Emergency Medicine

## 2020-08-22 ENCOUNTER — Encounter (HOSPITAL_COMMUNITY): Payer: Self-pay

## 2020-08-22 DIAGNOSIS — E039 Hypothyroidism, unspecified: Secondary | ICD-10-CM | POA: Diagnosis not present

## 2020-08-22 DIAGNOSIS — N184 Chronic kidney disease, stage 4 (severe): Secondary | ICD-10-CM | POA: Diagnosis not present

## 2020-08-22 DIAGNOSIS — Z87891 Personal history of nicotine dependence: Secondary | ICD-10-CM | POA: Diagnosis not present

## 2020-08-22 DIAGNOSIS — I2510 Atherosclerotic heart disease of native coronary artery without angina pectoris: Secondary | ICD-10-CM | POA: Insufficient documentation

## 2020-08-22 DIAGNOSIS — J441 Chronic obstructive pulmonary disease with (acute) exacerbation: Secondary | ICD-10-CM | POA: Diagnosis not present

## 2020-08-22 DIAGNOSIS — I1310 Hypertensive heart and chronic kidney disease without heart failure, with stage 1 through stage 4 chronic kidney disease, or unspecified chronic kidney disease: Secondary | ICD-10-CM | POA: Insufficient documentation

## 2020-08-22 DIAGNOSIS — Z79899 Other long term (current) drug therapy: Secondary | ICD-10-CM | POA: Diagnosis not present

## 2020-08-22 DIAGNOSIS — Z7982 Long term (current) use of aspirin: Secondary | ICD-10-CM | POA: Insufficient documentation

## 2020-08-22 DIAGNOSIS — I951 Orthostatic hypotension: Secondary | ICD-10-CM | POA: Diagnosis not present

## 2020-08-22 DIAGNOSIS — Z951 Presence of aortocoronary bypass graft: Secondary | ICD-10-CM | POA: Insufficient documentation

## 2020-08-22 DIAGNOSIS — R531 Weakness: Secondary | ICD-10-CM | POA: Diagnosis present

## 2020-08-22 LAB — BASIC METABOLIC PANEL
Anion gap: 7 (ref 5–15)
BUN: 22 mg/dL (ref 8–23)
CO2: 26 mmol/L (ref 22–32)
Calcium: 8.5 mg/dL — ABNORMAL LOW (ref 8.9–10.3)
Chloride: 107 mmol/L (ref 98–111)
Creatinine, Ser: 3.08 mg/dL — ABNORMAL HIGH (ref 0.61–1.24)
GFR calc Af Amer: 23 mL/min — ABNORMAL LOW (ref >60)
GFR calc non Af Amer: 19 mL/min — ABNORMAL LOW (ref >60)
Glucose, Bld: 129 mg/dL — ABNORMAL HIGH (ref 70–99)
Potassium: 4.7 mmol/L (ref 3.5–5.1)
Sodium: 140 mmol/L (ref 135–145)

## 2020-08-22 LAB — CBC
HCT: 35.2 % — ABNORMAL LOW (ref 39.0–52.0)
Hemoglobin: 11.1 g/dL — ABNORMAL LOW (ref 13.0–17.0)
MCH: 29.9 pg (ref 26.0–34.0)
MCHC: 31.5 g/dL (ref 30.0–36.0)
MCV: 94.9 fL (ref 80.0–100.0)
Platelets: 198 10*3/uL (ref 150–400)
RBC: 3.71 MIL/uL — ABNORMAL LOW (ref 4.22–5.81)
RDW: 16.4 % — ABNORMAL HIGH (ref 11.5–15.5)
WBC: 11.9 10*3/uL — ABNORMAL HIGH (ref 4.0–10.5)
nRBC: 0 % (ref 0.0–0.2)

## 2020-08-22 LAB — CBG MONITORING, ED: Glucose-Capillary: 127 mg/dL — ABNORMAL HIGH (ref 70–99)

## 2020-08-22 MED ORDER — ONDANSETRON HCL 4 MG/2ML IJ SOLN
4.0000 mg | Freq: Once | INTRAMUSCULAR | Status: AC
  Administered 2020-08-22: 4 mg via INTRAVENOUS
  Filled 2020-08-22: qty 2

## 2020-08-22 NOTE — ED Provider Notes (Signed)
[facility] EMERGENCY DEPARTMENT Provider Note   CSN: [id number] Arrival date & time: 08/22/20  1327     History Chief Complaint  Patient presents with  . Weakness  . Nausea    Pratt Bress is a 69 y.o. male.  Pt presents to the ED today with weakness.  Pt said he was outside mowing and working on his lawn.  He became very sweaty and felt like he could no longer stand up.  Pt said he sat down on a bench and just did not feel like he could get up.  He sat there for about 1 to 2 hours.  His wife came home and called EMS.  EMS gave him 900 cc NS.  Pt felt much better after the fluids.  Pt waited for over 8 hours and said he feels back to normal and is ready to go home.  Pt said he has not eaten anything since yesterday evening.  He does not like to drink water and drank very little prior to working outside.        Past Medical History:  Diagnosis Date  . Anxiety   . Arthritis    "left knee" (08/05/2016)  . Bruises easily   . CAD (coronary artery disease) of artery bypass graft 08/05/2016   Around age 29. CABG - 3 vessel.   . Chronic kidney disease   . Colon polyp   . 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 Emphysema of lung ([place])   . Essential tremor 06/11/2016   Plans to see Dr. Carles Collet  . Former smoker 09/01/2016   Quit 2012. 40 pack years at least.   . GERD (gastroesophageal reflux disease)   . Headache   . History of blood transfusion 08/1997   "when he had his heart surgery"  . History of shingles 1970-2013 X 3  . HTN (hypertension) 08/05/2016   Amlodipine 5mg , atenolol 50mg  BID, benazepril 20mg   . Hyperlipemia   . Hypothyroidism   . Lumbar disc disease   . MGUS (monoclonal  gammopathy of unknown significance)   . Myocardial infarction ([place]) 08/1997  . Peripheral vascular disease ([place])   . PONV (postoperative nausea and vomiting)   . Urinary tract infection 09/03/2017  . Wears glasses     Patient Active Problem List   Diagnosis Date Noted  . Hearing abnormally acute, left 04/03/2020  . Healthcare maintenance 04/03/2020  . Chronic kidney disease (CKD), stage IV (severe) ([place]) 08/13/2019  . S/p nephrectomy 12/05/2018  . MGUS (monoclonal gammopathy of unknown significance) 08/02/2018  . Osteoporosis 06/26/2018  . Perennial allergic rhinitis 01/24/2018  . 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 Constipation 09/03/2017  . Anxiety 07/24/2017  . Atherosclerosis of native arteries of extremity with intermittent claudication ([place]) 07/06/2017  . Pyelonephritis 04/23/2017  . Weight loss 04/23/2017  . History of adenomatous polyp of colon 04/22/2017  . Aortic atherosclerosis ([place]) 04/13/2017  . Hyperlipidemia 04/04/2017  . Hypothyroidism (acquired) 04/04/2017  . Seasonal allergies 04/04/2017  . Myalgia 10/04/2016  . Former smoker 09/01/2016  . 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 Dysuria 08/23/2016  . Chronic pain of both knees 08/23/2016  . Hx of CABG 08/05/2016  . CAD (coronary artery disease)  of artery bypass graft 08/05/2016  . Essential hypertension 08/05/2016  . Essential tremor 06/11/2016  . Tension headache 06/11/2016    Past Surgical History:  Procedure Laterality Date  . AORTOGRAM Right 08/31/2017   Procedure: AORTOGRAM BILATERAL PELVIC ANGIOGRAM WITH LEFT ILIAC ARTERY STENT;  Surgeon: Conrad Pollock, MD;  Location: [facility];  Service: Vascular;  Laterality: Right;  . BACK SURGERY    . CARDIAC CATHETERIZATION  08/1997  . COLONOSCOPY  2014  . CORONARY ARTERY BYPASS GRAFT  09/12/1997   "triple"  . ESOPHAGOGASTRODUODENOSCOPY    . INGUINAL HERNIA REPAIR Right 1961  . KIDNEY REMOVED    . KNEE RECONSTRUCTION Left 1960s - 1974 X 4  . LAPAROSCOPIC  ABLATION RENAL MASS  ~ 2014   "large abscess/pus ball"  . [place]   "they were wedged in the bowel"  . PATELLA FRACTURE SURGERY Left 1963  . PATELLA FRACTURE SURGERY Left ~ 1965   "removed knee cap"  . ROBOT ASSISTED LAPAROSCOPIC NEPHRECTOMY Left 10/07/2017   Procedure: XI ROBOTIC ASSISTED LAPAROSCOPIC NEPHRECTOMY OF PELVIC KIDNEY;  Surgeon: Alexis Frock, MD;  Location: [facility];  Service: Urology;  Laterality: Left;  Place robot patient tower at foot of bed like prostate per Dr. Tresa Moore. Pull extra staple loads.  . TONSILLECTOMY         Family History  Problem Relation Age of Onset  . Alcohol abuse Mother   . Alcohol abuse Father        not involved in life  . Alcoholism Sister   . Healthy Sister   . Healthy Sister   . Allergic rhinitis Neg Hx   . Angioedema Neg Hx   . Asthma Neg Hx   . Eczema Neg Hx   . Immunodeficiency Neg Hx   . Urticaria Neg Hx     Social History   Tobacco Use  . Smoking status: Former Smoker    Packs/day: 1.00    Years: 45.00    Pack years: 45.00    Types: Cigarettes    Quit date: 07/10/2017    Years since quitting: 3.1  . Smokeless tobacco: Never Used  Vaping Use  . Vaping Use: Never used  Substance Use Topics  . Alcohol use: Yes    Alcohol/week: 14.0 standard drinks    Types: 14 Shots of liquor per week    Comment: daily 2 shots of vodka  . Drug use: No    Home Medications Prior to Admission medications   Medication Sig Start Date End Date Taking? Authorizing Provider  acetaminophen (TYLENOL) 500 MG tablet Take 500-1,000 mg by mouth every 6 (six) hours as needed (for pain).     [provider]  albuterol (PROVENTIL) (2.5 MG/3ML) 0.083% nebulizer solution Take 3 mLs (2.5 mg total) by nebulization every 6 (six) hours as needed for wheezing or shortness of breath. J44.9 12/14/18   Juanito Doom, MD  albuterol (VENTOLIN HFA) 108 (90 Base) MCG/ACT inhaler Inhale 2 puffs into the lungs every 4 (four) hours as  needed for wheezing or shortness of breath. 03/07/20   [name], MD  aspirin EC 81 MG tablet Take 81 mg by mouth daily.    [provider]  atenolol (TENORMIN) 50 MG tablet TAKE 1 TABLET DAILY 02/04/20   [name], MD  budesonide-formoterol [facility]) 160-4.5 MCG/ACT inhaler USE 2 INHALATIONS TWICE A DAY 10/08/19   Juanito Doom, MD  famotidine (PEPCID) 20 MG tablet Take 1 tablet (20 mg  total) by mouth 2 (two) times daily. 04/23/20   Gatha Mayer, MD  fluticasone (FLONASE) 50 MCG/ACT nasal spray Place 2 sprays into both nostrils daily. 11/02/19   [name], MD  ondansetron (ZOFRAN-ODT) 4 MG disintegrating tablet PLACE 1 TO 2 TABLETS ON TONGUE AND ALLOW TO DISSOLVE EVERY 8 HOURS AS NEEDED FOR NAUSEA / VOMITING 08/13/20   [name], MD  rosuvastatin (CRESTOR) 40 MG tablet Take 40 mg by mouth daily. 12/02/19   [provider]  SYNTHROID 112 MCG tablet TAKE 1 TABLET DAILY BEFORE BREAKFAST 02/28/20   [name], MD  Tiotropium Bromide Monohydrate (SPIRIVA RESPIMAT) 2.5 MCG/ACT AERS USE 2 INHALATIONS DAILY 04/03/20   Lauraine Rinne, NP  traMADol (ULTRAM) 50 MG tablet TAKE 1 TABLET (50 MG TOTAL) BY MOUTH 2 (TWO) TIMES DAILY AS NEEDED FOR MODERATE PAIN OR SEVERE PAIN. 03/05/20   [name], MD    Allergies    Contrast media [iodinated diagnostic agents], Other, Keflex [cephalexin], Sulfa antibiotics, Ciprofloxacin, Levaquin [levofloxacin], and Omeprazole  Review of Systems   Review of Systems  Neurological: Positive for dizziness and weakness.  All other systems reviewed and are negative.   Physical Exam Updated Vital Signs BP (!) 175/73 (BP Location: Right Arm)   Pulse 62   Temp 98.2 F (36.8 C) (Oral)   Resp 16   Ht 5\' 9"  (1.753 m)   Wt 64.4 kg   SpO2 98%   BMI 20.97 kg/m   Physical Exam Vitals and nursing note reviewed.  Constitutional:      Appearance: Normal appearance.  HENT:     Head: Normocephalic and atraumatic.       Right Ear: External ear normal.     Left Ear: External ear normal.     Nose: Nose normal.     Mouth/Throat:     Mouth: Mucous membranes are moist.     Pharynx: Oropharynx is clear.  Eyes:     Extraocular Movements: Extraocular movements intact.     Conjunctiva/sclera: Conjunctivae normal.     Pupils: Pupils are equal, round, and reactive to light.  Cardiovascular:     Rate and Rhythm: Normal rate and regular rhythm.     Pulses: Normal pulses.     Heart sounds: Normal heart sounds.  Pulmonary:     Effort: Pulmonary effort is normal.     Breath sounds: Normal breath sounds.  Abdominal:     General: Abdomen is flat. Bowel sounds are normal.     Palpations: Abdomen is soft.  Musculoskeletal:        General: Normal range of motion.     Cervical back: Normal range of motion and neck supple.  Skin:    General: Skin is warm.     Capillary Refill: Capillary refill takes less than 2 seconds.  Neurological:     General: No focal deficit present.     Mental Status: He is alert and oriented to person, place, and time.  Psychiatric:        Mood and Affect: Mood normal.        Behavior: Behavior normal.     ED Results / Procedures / Treatments   Labs (all labs ordered are listed, but only abnormal results are displayed) Labs Reviewed  BASIC METABOLIC PANEL - Abnormal; Notable for the following components:      Result Value   Glucose, Bld 129 (*)    Creatinine, Ser 3.08 (*)    Calcium 8.5 (*)  GFR calc non Af Amer 19 (*)    GFR calc Af Amer 23 (*)    All other components within normal limits  CBC - Abnormal; Notable for the following components:   WBC 11.9 (*)    RBC 3.71 (*)    Hemoglobin 11.1 (*)    HCT 35.2 (*)    RDW 16.4 (*)    All other components within normal limits  CBG MONITORING, ED - Abnormal; Notable for the following components:   Glucose-Capillary 127 (*)    All other components within normal limits    EKG EKG Interpretation  Date/Time:  Friday  August 22 2020 13:36:22 EDT Ventricular Rate:  55 PR Interval:  190 QRS Duration: 100 QT Interval:  458 QTC Calculation: 438 R Axis:   -45 Text Interpretation: Sinus bradycardia Left axis deviation Incomplete right bundle branch block Abnormal ECG No significant change since last tracing Confirmed by Isla Pence [phone]) on 08/22/2020 10:09:44 PM   Radiology No results found.  Procedures Procedures (including critical care time)  Medications Ordered in ED Medications  ondansetron (ZOFRAN) injection 4 mg (4 mg Intravenous Given 08/22/20 1344)    ED Course  I have reviewed the triage vital signs and the nursing notes.  Pertinent labs & imaging results that were available during my care of the patient were reviewed by me and considered in my medical decision making (see chart for details).    MDM Rules/Calculators/A&P                         Pt feels much better after fluids.  He said he saw Dr. Justin Mend (nephrology) and his Cr 2 weeks ago was 3.  Pt looks good and wants to go home.  He is encouraged to drink more fluids.  Return if worse.  Final Clinical Impression(s) / ED Diagnoses Final diagnoses:  Orthostatic hypotension  CKD (chronic kidney disease) stage 4, GFR 15-29 ml/min [facility])    Rx / DC Orders ED Discharge Orders    None       Isla Pence, MD 08/22/20 2210

## 2020-08-22 NOTE — ED Triage Notes (Signed)
Pt from home with ems, was working out in the yard when he started to feel weak, lightheaded and became diaphoretic. Pt sat down on bench outside and wife found him about 1 hr later, lethargic and slow to answer. Pt given 964ml fluid en route. Pt now alert oriented x4. Tremors at baseline and dry heaving.  BP 172/84 HR58

## 2020-08-22 NOTE — ED Notes (Signed)
Patient verbalizes understanding of discharge instructions. Opportunity for questioning and answers were provided. Armband removed by staff, pt discharged from ED to home 

## 2020-09-08 ENCOUNTER — Other Ambulatory Visit: Payer: Self-pay | Admitting: Family Medicine

## 2020-09-25 ENCOUNTER — Other Ambulatory Visit: Payer: Self-pay | Admitting: Internal Medicine

## 2020-09-26 ENCOUNTER — Other Ambulatory Visit: Payer: Self-pay

## 2020-09-26 MED ORDER — FAMOTIDINE 20 MG PO TABS: 20.0000 mg | ORAL_TABLET | Freq: Two times a day (BID) | ORAL | 1 refills | Status: DC

## 2020-09-26 NOTE — Telephone Encounter (Signed)
Patient sent a MyChart message to please refill his famotidine. This has been done and I sent him a MYChart message back.

## 2020-10-07 ENCOUNTER — Other Ambulatory Visit: Payer: Self-pay | Admitting: *Deleted

## 2020-10-07 DIAGNOSIS — I745 Embolism and thrombosis of iliac artery: Secondary | ICD-10-CM

## 2020-10-07 DIAGNOSIS — I70212 Atherosclerosis of native arteries of extremities with intermittent claudication, left leg: Secondary | ICD-10-CM

## 2020-10-07 DIAGNOSIS — I739 Peripheral vascular disease, unspecified: Secondary | ICD-10-CM

## 2020-10-14 ENCOUNTER — Other Ambulatory Visit: Payer: Self-pay

## 2020-10-14 ENCOUNTER — Encounter: Payer: Self-pay | Admitting: Family Medicine

## 2020-10-14 ENCOUNTER — Other Ambulatory Visit: Payer: Self-pay | Admitting: Family Medicine

## 2020-10-14 MED ORDER — ONDANSETRON 4 MG PO TBDP: ORAL_TABLET | ORAL | 1 refills | Status: DC

## 2020-10-15 ENCOUNTER — Encounter: Payer: Self-pay | Admitting: Family Medicine

## 2020-10-15 ENCOUNTER — Other Ambulatory Visit: Payer: Self-pay | Admitting: Family Medicine

## 2020-10-15 ENCOUNTER — Other Ambulatory Visit: Payer: Self-pay

## 2020-10-20 ENCOUNTER — Ambulatory Visit (INDEPENDENT_AMBULATORY_CARE_PROVIDER_SITE_OTHER)
Admission: RE | Admit: 2020-10-20 | Discharge: 2020-10-20 | Disposition: A | Discharge status: Home / Self Care | Payer: Medicare HMO | Source: Ambulatory Visit | Attending: Surgery | Admitting: Surgery

## 2020-10-20 ENCOUNTER — Other Ambulatory Visit: Payer: Self-pay

## 2020-10-20 ENCOUNTER — Ambulatory Visit (INDEPENDENT_AMBULATORY_CARE_PROVIDER_SITE_OTHER): Payer: Medicare HMO | Admitting: Physician Assistant

## 2020-10-20 ENCOUNTER — Ambulatory Visit (HOSPITAL_COMMUNITY)
Admission: RE | Admit: 2020-10-20 | Discharge: 2020-10-20 | Disposition: A | Discharge status: Home / Self Care | Payer: Medicare HMO | Source: Ambulatory Visit | Attending: Vascular Surgery | Admitting: Vascular Surgery

## 2020-10-20 VITALS — BP 151/81 | HR 59 | Temp 97.3°F | Resp 20 | Ht 69.0 in | Wt 144.2 lb

## 2020-10-20 DIAGNOSIS — I70212 Atherosclerosis of native arteries of extremities with intermittent claudication, left leg: Secondary | ICD-10-CM | POA: Insufficient documentation

## 2020-10-20 DIAGNOSIS — I745 Embolism and thrombosis of iliac artery: Secondary | ICD-10-CM | POA: Diagnosis not present

## 2020-10-20 DIAGNOSIS — I739 Peripheral vascular disease, unspecified: Secondary | ICD-10-CM

## 2020-10-20 NOTE — Progress Notes (Signed)
Established Intermittent Claudication   History of Present Illness   Brian Graham is a 69 y.o. (1951/01/06) male who presents to go over vascular studies related to PAD.  Past surgical history significant for left iliac artery stent in 2018 by Dr. Bridgett Larsson for claudication.  He denies any claudication, rest pain, or nonhealing wounds of bilateral lower extremities.  He is on aspirin and a statin daily.  He has also been seen in the past in this office for evaluation of declining kidney function.  He has a solitary right kidney without hemodynamically significant stenosis noted on previous renal ultrasound.  Patient states that his kidney function continues to decline however he is not willing to consent to dialysis if needed.  CKD is managed by Dr. Justin Mend.  Current Outpatient Medications  Medication Sig Dispense Refill  . acetaminophen (TYLENOL) 500 MG tablet Take 500-1,000 mg by mouth every 6 (six) hours as needed (for pain).     Marland Kitchen albuterol (PROVENTIL) (2.5 MG/3ML) 0.083% nebulizer solution Take 3 mLs (2.5 mg total) by nebulization every 6 (six) hours as needed for wheezing or shortness of breath. J44.9 300 mL 11  . albuterol (VENTOLIN HFA) 108 (90 Base) MCG/ACT inhaler Inhale 2 puffs into the lungs every 4 (four) hours as needed for wheezing or shortness of breath. 18 g 3  . aspirin EC 81 MG tablet Take 81 mg by mouth daily.    Marland Kitchen atenolol (TENORMIN) 50 MG tablet TAKE 1 TABLET DAILY 90 tablet 3  . budesonide-formoterol (SYMBICORT) 160-4.5 MCG/ACT inhaler USE 2 INHALATIONS TWICE A DAY 30.6 g 3  . famotidine (PEPCID) 20 MG tablet Take 1 tablet (20 mg total) by mouth 2 (two) times daily. 180 tablet 1  . ondansetron (ZOFRAN-ODT) 4 MG disintegrating tablet PLACE 1 TO 2 TABLETS ON TONGUE AND ALLOW TO DISSOLVE EVERY 8 HOURS AS NEEDED FOR NAUSEA / VOMITING 60 tablet 1  . rosuvastatin (CRESTOR) 40 MG tablet TAKE 1 TABLET DAILY (NEED APPOINTMENT) 90 tablet 3  . SYNTHROID 112 MCG tablet TAKE 1 TABLET  DAILY BEFORE BREAKFAST 90 tablet 3  . Tiotropium Bromide Monohydrate (SPIRIVA RESPIMAT) 2.5 MCG/ACT AERS USE 2 INHALATIONS DAILY 12 g 3  . traMADol (ULTRAM) 50 MG tablet TAKE 1 TABLET (50 MG TOTAL) BY MOUTH 2 (TWO) TIMES DAILY AS NEEDED FOR MODERATE PAIN OR SEVERE PAIN. 60 tablet 5  . fluticasone (FLONASE) 50 MCG/ACT nasal spray Place 2 sprays into both nostrils daily. (Patient not taking: Reported on 10/20/2020) 16 g 3   No current facility-administered medications for this visit.    REVIEW OF SYSTEMS (negative unless checked):   Cardiac:  []  Chest pain or chest pressure? []  Shortness of breath upon activity? []  Shortness of breath when lying flat? []  Irregular heart rhythm?  Vascular:  []  Pain in calf, thigh, or hip brought on by walking? []  Pain in feet at night that wakes you up from your sleep? []  Blood clot in your veins? []  Leg swelling?  Pulmonary:  []  Oxygen at home? []  Productive cough? []  Wheezing?  Neurologic:  []  Sudden weakness in arms or legs? []  Sudden numbness in arms or legs? []  Sudden onset of difficult speaking or slurred speech? []  Temporary loss of vision in one eye? []  Problems with dizziness?  Gastrointestinal:  []  Blood in stool? []  Vomited blood?  Genitourinary:  []  Burning when urinating? []  Blood in urine?  Psychiatric:  []  Major depression  Hematologic:  []  Bleeding problems? []  Problems with blood clotting?  Dermatologic:  []  Rashes or ulcers?  Constitutional:  []  Fever or chills?  Ear/Nose/Throat:  []  Change in hearing? []  Nose bleeds? []  Sore throat?  Musculoskeletal:  []  Back pain? []  Joint pain? []  Muscle pain?   Physical Examination   Vitals:   10/20/20 0928  BP: (!) 151/81  Pulse: (!) 59  Resp: 20  Temp: (!) 97.3 F (36.3 C)  TempSrc: Temporal  SpO2: 97%  Weight: 144 lb 3.2 oz (65.4 kg)  Height: 5\' 9"  (1.753 m)   Body mass index is 21.29 kg/m.  General:  WDWN in NAD; vital signs documented  above Gait: Not observed HENT: WNL, normocephalic Pulmonary: normal non-labored breathing , without Rales, rhonchi,  wheezing Cardiac: regular HR Abdomen: soft, NT, no masses Skin: without rashes Vascular Exam/Pulses:  Right Left  Radial 2+ (normal) 2+ (normal)  DP 2+ (normal) 1+ (weak)   Extremities: without ischemic changes, without Gangrene , without cellulitis; without open wounds;  Musculoskeletal: no muscle wasting or atrophy  Neurologic: A&O X 3;  No focal weakness or paresthesias are detected Psychiatric:  The pt has Normal affect.  Non-Invasive Vascular imaging   ABI  ABI/TBIToday's ABIToday's TBIPrevious ABIPrevious TBI  +-------+-----------+-----------+------------+------------+  Right 1.07    0.83    1.10    0.75      +-------+-----------+-----------+------------+------------+  Left  1.07    0.71    1.01    0.62       Aortoiliac Duplex   Iliac stents widely patent without hemodynamically significant stenosis   Medical Decision Making   Brian Graham is a 69 y.o. male who presents for surveillance of PAD   Bilateral lower extremities are well perfused based on ABIs as well as physical exam  Left iliac artery stent remains widely patent with no hemodynamically significant stenosis noted  Continue aspirin and statin daily  Recheck ABIs and aortoiliac duplex in 1 year   Brian Ligas PA-C [facility] Office: [facility] MD: Trula Slade

## 2020-10-22 NOTE — Progress Notes (Signed)
Phone [phone] In person visit   Subjective:   Brian Graham is a 69 y.o. year old very pleasant male patient who presents for/with See problem oriented charting Chief Complaint  Patient presents with  . Left Leg and Foot Pain    Pt states that the top of his foot is in pain and the pain raidates up to his knee. big toe is red red, foot swelling    This visit occurred during the SARS-CoV-2 public health emergency.  Safety protocols were in place, including screening questions prior to the visit, additional usage of staff PPE, and extensive cleaning of exam room while observing appropriate contact time as indicated for disinfecting solutions.   Past Medical History-  Patient Active Problem List   Diagnosis Date Noted  . Chronic kidney disease (CKD), stage IV (severe) ([place]) 08/13/2019    Priority: High  . S/p nephrectomy 12/05/2018    Priority: High  . MGUS (monoclonal gammopathy of unknown significance) 08/02/2018    Priority: High  . Osteoporosis 06/26/2018    Priority: High  . Atherosclerosis of native arteries of extremity with intermittent claudication ([place]) 07/06/2017    Priority: High  . Weight loss 04/23/2017    Priority: High  . Former smoker 09/01/2016    Priority: High  . COPD, severe ([place]) 08/23/2016    Priority: High  . Hx of CABG 08/05/2016    Priority: High  . CAD (coronary artery disease) of artery bypass graft 08/05/2016    Priority: High  . 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 History of adenomatous polyp of colon 04/22/2017    Priority: Medium  . Hyperlipidemia 04/04/2017    Priority: Medium  . Hypothyroidism (acquired) 04/04/2017    Priority: Medium  . Essential hypertension 08/05/2016    Priority: Medium  . Essential tremor 06/11/2016    Priority: Medium  . Perennial allergic rhinitis 01/24/2018    Priority: Low  . Anxiety 07/24/2017    Priority: Low  . Pyelonephritis 04/23/2017    Priority: Low  . Aortic  atherosclerosis ([place]) 04/13/2017    Priority: Low  . Seasonal allergies 04/04/2017    Priority: Low  . Myalgia 10/04/2016    Priority: Low  . Dysuria 08/23/2016    Priority: Low  . Chronic pain of both knees 08/23/2016    Priority: Low  . Tension headache 06/11/2016    Priority: Low  . Hearing abnormally acute, left 04/03/2020  . Healthcare maintenance 04/03/2020  . Constipation 09/03/2017    Medications- reviewed and updated Current Outpatient Medications  Medication Sig Dispense Refill  . acetaminophen (TYLENOL) 500 MG tablet Take 500-1,000 mg by mouth every 6 (six) hours as needed (for pain).     Marland Kitchen albuterol (PROVENTIL) (2.5 MG/3ML) 0.083% nebulizer solution Take 3 mLs (2.5 mg total) by nebulization every 6 (six) hours as needed for wheezing or shortness of breath. J44.9 300 mL 11  . albuterol (VENTOLIN HFA) 108 (90 Base) MCG/ACT inhaler Inhale 2 puffs into the lungs every 4 (four) hours as needed for wheezing or shortness of breath. 18 g 3  . aspirin EC 81 MG tablet Take 81 mg by mouth daily.    Marland Kitchen atenolol (TENORMIN) 50 MG tablet TAKE 1 TABLET DAILY 90 tablet 3  . budesonide-formoterol (SYMBICORT) 160-4.5 MCG/ACT inhaler USE 2 INHALATIONS TWICE A DAY 30.6 g 3  . famotidine (PEPCID) 20 MG tablet Take 1 tablet (20 mg total) by mouth 2 (two) times daily. 180 tablet 1  .  ondansetron (ZOFRAN-ODT) 4 MG disintegrating tablet PLACE 1 TO 2 TABLETS ON TONGUE AND ALLOW TO DISSOLVE EVERY 8 HOURS AS NEEDED FOR NAUSEA / VOMITING 60 tablet 1  . rosuvastatin (CRESTOR) 40 MG tablet TAKE 1 TABLET DAILY (NEED APPOINTMENT) 90 tablet 3  . SYNTHROID 112 MCG tablet TAKE 1 TABLET DAILY BEFORE BREAKFAST 90 tablet 3  . Tiotropium Bromide Monohydrate (SPIRIVA RESPIMAT) 2.5 MCG/ACT AERS USE 2 INHALATIONS DAILY 12 g 3  . traMADol (ULTRAM) 50 MG tablet TAKE 1 TABLET (50 MG TOTAL) BY MOUTH 2 (TWO) TIMES DAILY AS NEEDED FOR MODERATE PAIN OR SEVERE PAIN. 60 tablet 5  . fluticasone (FLONASE) 50 MCG/ACT nasal spray  Place 2 sprays into both nostrils daily. (Patient not taking: Reported on 10/20/2020) 16 g 3   No current facility-administered medications for this visit.     Objective:  BP (!) 142/88   Pulse 69   Temp 98.1 F (36.7 C) (Temporal)   Ht 5\' 9"  (1.753 m)   Wt 144 lb 3.2 oz (65.4 kg)   SpO2 98%   BMI 21.29 kg/m  Gen: NAD, resting comfortably CV: RRR no murmurs rubs or gallops Lungs: CTAB no crackles, wheeze, rhonchi Ext: no edema Skin: warm, dry MSK: No pain at IP or MTP joint of left great toe.  Patient with some pain over midfoot with palpation and slightly warm and erythematous.  With forced pronation of the foot patient complains of significant pain    Assessment and Plan   # left foot pain S:Left great toe pain about a week ago- thought mayb eit was his new sneakers that were not very good sneakers. Changed sneakers but pain worsened. At first would wake up in morning with toe pain. Initially hurt in IP join tand then that improved by Sunday. Then started with pain in anterior midfoot. Pain up to 7/10. Worse with pushing on it or walking. No falls or injuries - did have syncopal episode from dehydratoin/orthostatics early October but nothing since then. Gets some radiation of pain up the leg and to his shin. Foot is warm and tender to touch.   No known history of gout. No uric acid on file.   Cr 3 in October. GFR under 20 A/P: Patient with left foot pain he is concerned this could be gout.  No history of gout but renal function has worsened-I am not sure the uric acid level would be helpful acutely so we opted out of this.  Considered x-ray but we do not have this available in the building at this time.  We discussed potentially empirically treating with prednisone but patient was very concerned as apparently someone told him he could not take prednisone-he believes his ENT will he wants to make sure nephrology did not say this.  I told him I did not think nephrology would have  advised against prednisone but regardless he prefers to check instead of starting medication.  I would be more cautious about colchicine with his GFR being so impaired  Patient also asked about infection-with how painful it was with forced flexion-I do not think cellulitis would cause this-so we would be more concerned about a joint infection-I am not strongly suspicious of this though we will get sports medicine opinion tomorrow as below  Due to delays in imaging as well as potential treatment in fact we thought we could get more information with a sports medicine visit-patient and I jointly agreed to stat referral to sports medicine-thankfully patient was able to be  worked in [employer], [employer] counseling-patient apparently had sudden left hearing loss 2 weeks after covid-19 vaccination and he asked my opinion about getting booster vaccination-with his overall risk we discussed I likely would still lean toward booster.  Upon further review patient sudden hearing loss was March 22 which would have been closer to 6 weeks post vaccination-I think this makes it even less likely to be a causal relationship    Recommended follow up: As needed for acute concerns Future Appointments  Date Time Provider [place]  10/24/2020 11:00 AM Gregor Hams, MD [facility] None    Lab/Order associations:   ICD-10-CM   1. Left foot pain  M79.672 Ambulatory referral to Sports Medicine   Time Spent: 20 minutes of total time (10:23 AM-10:43 AM) was spent on the date of the encounter performing the following actions: chart review prior to seeing the patient, obtaining history, performing a medically necessary exam, counseling on the treatment plan, placing orders, and documenting in our EHR.   Return precautions advised.  Garret Reddish, MD

## 2020-10-22 NOTE — Patient Instructions (Addendum)
Urgent referral to sports medicine . Team please give him appointment date before he goes.   Check with Dr. Justin Mend and see if prednisone ok if needed  This could be gout - I think its reasonable to do sports medicine referral so we can get x-ray or ultrasound if needed for more information.

## 2020-10-23 ENCOUNTER — Other Ambulatory Visit: Payer: Self-pay

## 2020-10-23 ENCOUNTER — Encounter: Payer: Self-pay | Admitting: Family Medicine

## 2020-10-23 ENCOUNTER — Ambulatory Visit (INDEPENDENT_AMBULATORY_CARE_PROVIDER_SITE_OTHER): Payer: Medicare HMO | Admitting: Family Medicine

## 2020-10-23 VITALS — BP 142/88 | HR 69 | Temp 98.1°F | Ht 69.0 in | Wt 144.2 lb

## 2020-10-23 DIAGNOSIS — M79672 Pain in left foot: Secondary | ICD-10-CM | POA: Diagnosis not present

## 2020-10-23 NOTE — Progress Notes (Signed)
Subjective:    I'm seeing this patient as a consultation for Dr. Garret Reddish. Note will be routed back to referring provider/PCP.  CC: Left foot pain  I, Molly Weber, LAT, ATC, am serving as scribe for Dr. Lynne Leader.  HPI: Pt is a 69yo male c/o pain in his L foot and Great toe. Pt was seen by PCP on 10/23/20 c/o pain in L great toe pain about a week ago. Pt suspected his new sneakers that were not very good sneakers caused the pain. Pt changed sneakers, but pain worsened. Pt complained initially pain was in the Great toe IP joint, but improved on Sunday, 11/28. Pain then moved to dorsal aspect of midfoot, 7/10. Today, pt reports that he has the majority of his pain in his L dorsal midfoot that radiates into his L ant ankle and lower leg.  Radiating: Yes- proximally to L ant ankle Swelling: yes in the top of his L foot Redness: yes Aggravates: pushing off ball of the foot or walking; driving; any pressure to his L foot Rx tried: Nothing   No known history of gout. No uric acid on file.   Past medical history, Surgical history, Family history, Social history, Allergies, and medications have been entered into the medical record, reviewed.   Review of Systems: No new headache, visual changes, nausea, vomiting, diarrhea, constipation, dizziness, abdominal pain, skin rash, fevers, chills, night sweats, weight loss, swollen lymph nodes, body aches, joint swelling, muscle aches, chest pain, shortness of breath, mood changes, visual or auditory hallucinations.   Objective:    Vitals:   10/24/20 1048  BP: 140/78  Pulse: 64  SpO2: 98%   General: Well Developed, well nourished, and in no acute distress.  Neuro/Psych: Alert and oriented x3, extra-ocular muscles intact, able to move all 4 extremities, sensation grossly intact. Skin: Warm and dry, no rashes noted.  Respiratory: Not using accessory muscles, speaking in full sentences, trachea midline.  Cardiovascular: Pulses palpable, no  extremity edema. Abdomen: Does not appear distended. MSK: Left foot and leg Slight redness dorsal midfoot around first and second tarsometatarsal joint. Mildly tender palpation is region. Great toe normal-appearing with no swelling or redness. Nontender first MTP to palpation. Ankle and lower leg are normal-appearing.  However patient is tender to palpation medial malleolus mildly. Foot ankle motion are intact however some pain is present with motion. Strength is intact. Pulses cap refill and sensation are intact distally.  Lab and Radiology Results X-ray images left foot obtained today personally and independently interpreted No fractures or severe erosions present.  No severe DJD present. Await formal radiology review  Diagnostic Limited MSK Ultrasound of: Left foot and ankle Mild effusion at first and second tarsometatarsal joints with some degeneration otherwise normal-appearing.  First MTP normal-appearing Medial ankle tendon structures normal-appearing Impression: Mild foot DJD  X-ray lumbar spine images available obtained during CT scan of abdomen and pelvis August 31, 2019 reviewed.  L5-S1 DDD present.  Impression and Recommendations:    Assessment and Plan: 69 y.o. male with left foot and ankle pain.  Etiology somewhat unclear.  Patient does have a little bit of redness and swelling and a little bit of degenerative changes at first and second tarsometatarsal joints.  However this does not fully explain the degree of his pain.  Could be due to L4 radiculopathy gout or other peripheral nerve issue.  Discussed options.  Plan to further work-up with uric acid metabolic panel lab today, and treat with CAM Gilford Rile  boot prednisone and limited low-dose gabapentin.  Patient does have CKD 4 so renally dose gabapentin.  Recheck next week for reassessment.  PDMP not reviewed this encounter. Orders Placed This Encounter  Procedures  . DG Foot Complete Left    Standing Status:   Future     Number of Occurrences:   1    Standing Expiration Date:   11/24/2020    Order Specific Question:   Reason for Exam (SYMPTOM  OR DIAGNOSIS REQUIRED)    Answer:   L foot pain    Order Specific Question:   Preferred imaging location?    Answer:   [facility]  . [place] LIMITED JOINT SPACE STRUCTURES LOW LEFT(NO LINKED CHARGES)    Standing Status:   Future    Number of Occurrences:   1    Standing Expiration Date:   04/24/2021    Order Specific Question:   Reason for Exam (SYMPTOM  OR DIAGNOSIS REQUIRED)    Answer:   left foot pain    Order Specific Question:   Preferred imaging location?    Answer:   [place]  . Uric acid    Standing Status:   Future    Number of Occurrences:   1    Standing Expiration Date:   10/24/2021  . Comprehensive metabolic panel    Standing Status:   Future    Number of Occurrences:   1    Standing Expiration Date:   10/24/2021   Meds ordered this encounter  Medications  . gabapentin (NEURONTIN) 100 MG capsule    Sig: Take 1 capsule (100 mg total) by mouth 3 (three) times daily as needed (for nerve pain).    Dispense:  30 capsule    Refill:  3  . predniSONE (DELTASONE) 10 MG tablet    Sig: Take 3 tablets (30 mg total) by mouth daily with breakfast.    Dispense:  15 tablet    Refill:  0    Discussed warning signs or symptoms. Please see discharge instructions. Patient expresses understanding.   The above documentation has been reviewed and is accurate and complete Lynne Leader, M.D.

## 2020-10-24 ENCOUNTER — Ambulatory Visit: Payer: Medicare HMO | Admitting: Family Medicine

## 2020-10-24 ENCOUNTER — Ambulatory Visit: Payer: Self-pay

## 2020-10-24 ENCOUNTER — Ambulatory Visit (INDEPENDENT_AMBULATORY_CARE_PROVIDER_SITE_OTHER): Payer: Medicare HMO

## 2020-10-24 ENCOUNTER — Other Ambulatory Visit: Payer: Self-pay

## 2020-10-24 ENCOUNTER — Encounter: Payer: Self-pay | Admitting: Family Medicine

## 2020-10-24 VITALS — BP 140/78 | HR 64 | Ht 69.0 in | Wt 144.2 lb

## 2020-10-24 DIAGNOSIS — M79672 Pain in left foot: Secondary | ICD-10-CM

## 2020-10-24 LAB — COMPREHENSIVE METABOLIC PANEL
ALT: 12 U/L (ref 0–53)
AST: 17 U/L (ref 0–37)
Albumin: 3.8 g/dL (ref 3.5–5.2)
Alkaline Phosphatase: 95 U/L (ref 39–117)
BUN: 26 mg/dL — ABNORMAL HIGH (ref 6–23)
CO2: 29 mEq/L (ref 19–32)
Calcium: 8.7 mg/dL (ref 8.4–10.5)
Chloride: 103 mEq/L (ref 96–112)
Creatinine, Ser: 3.24 mg/dL — ABNORMAL HIGH (ref 0.40–1.50)
GFR: 18.63 mL/min — ABNORMAL LOW (ref >60.00)
Glucose, Bld: 77 mg/dL (ref 70–99)
Potassium: 4.3 mEq/L (ref 3.5–5.1)
Sodium: 139 mEq/L (ref 135–145)
Total Bilirubin: 0.7 mg/dL (ref 0.2–1.2)
Total Protein: 7 g/dL (ref 6.0–8.3)

## 2020-10-24 LAB — URIC ACID: Uric Acid, Serum: 3.3 mg/dL — ABNORMAL LOW (ref 4.0–7.8)

## 2020-10-24 MED ORDER — GABAPENTIN 100 MG PO CAPS: 100.0000 mg | ORAL_CAPSULE | Freq: Three times a day (TID) | ORAL | 3 refills | Status: DC | PRN

## 2020-10-24 MED ORDER — PREDNISONE 10 MG PO TABS: 30.0000 mg | ORAL_TABLET | Freq: Every day | ORAL | 0 refills | Status: DC

## 2020-10-24 NOTE — Patient Instructions (Signed)
Thank you for coming in today.  This could be gout or a nerve problem.   Use the cam walker boot as needed. If it is more annoying than helpful stop it.   Take the prednisone  Use the gabapentin as needed mostly at bedtime.   Recheck next week.   Get labs today.

## 2020-10-27 NOTE — Progress Notes (Signed)
X-ray left foot is normal to radiology

## 2020-10-27 NOTE — Progress Notes (Signed)
Uric acid is low to normal.  This means that your pain is not likely to be due to gout.  Kidney function is stage IV chronic kidney disease

## 2020-10-29 NOTE — Progress Notes (Signed)
   I, Wendy Poet, LAT, ATC, am serving as scribe for Dr. Lynne Leader.  Brian Graham is a 69 y.o. male who presents to [place] at [facility] today for f/u of L foot pain and swelling.  He was last seen by Dr. Georgina Snell on 10/24/20 and was advised to get uric acid lab and treat w/ a CAM walker boot, prednisone and low-dose gabapentin. Today, pt reports foot pain continues. Pt had to discontinue CAM walker because of increased pain on opposite foot. Pt locates foot pain to dorsal aspect of foot and MT heads on the plantar aspect and talocrural joint.  Pertinent review of systems: No fevers or chills  Relevant historical information: CKD4.  Peripheral vascular disease.  And MGUS   Exam:  BP 116/76 (BP Location: Right Arm, Patient Position: Sitting, Cuff Size: Normal)   Ht 5\' 9"  (1.753 m)   Wt 146 lb 3.2 oz (66.3 kg)   BMI 21.59 kg/m  General: Well Developed, well nourished, and in no acute distress.   MSK: Left foot slight erythema dorsal foot.  Decreased motion.  Minimally tender dorsal foot.  Intact pulses dorsal pedis and posterior tibialis.  Normal capillary refill.    Lab and Radiology Results  EXAM: LEFT FOOT - COMPLETE 3+ VIEW  COMPARISON:  None.  FINDINGS: There is no evidence of fracture or dislocation. There is no evidence of arthropathy or other focal bone abnormality. Soft tissues are unremarkable.  IMPRESSION: Negative.   Electronically Signed   By: [name] M.D.   On: 10/24/2020 16:07 I, Lynne Leader, personally (independently) visualized and performed the interpretation of the images attached in this note.   Additionally report from vascular ultrasound October 20, 2028 reviewed showing normal ABI    Assessment and Plan: 69 y.o. male with left foot pain.  Ongoing without explanation for little over a week.  Doubtful for infection based on absence of worsening without antibiotics over the last week.  Doubtful for serious  orthopedic injury as he did not recall any injury.  Doubtful for ischemia as he just recently had a normal ABI.  X-ray last week was basically normal showing only a little arthritis.  The CAM Walker boot did help but he cannot tolerate it as it caused contralateral foot pain.  We discussed options.  Plan for MRI and recheck following MRI.  This should help evaluate cause of foot pain.  This is normal would look at lumbar spine as possible cause of pain as next.   PDMP not reviewed this encounter. Orders Placed This Encounter  Procedures  . MR FOOT LEFT WO CONTRAST    Standing Status:   Future    Standing Expiration Date:   10/30/2021    Order Specific Question:   What is the patient's sedation requirement?    Answer:   No Sedation    Order Specific Question:   Does the patient have a pacemaker or implanted devices?    Answer:   No    Order Specific Question:   Preferred imaging location?    Answer:   Product/process development scientist (table limit-350lbs)   No orders of the defined types were placed in this encounter.    Discussed warning signs or symptoms. Please see discharge instructions. Patient expresses understanding.   The above documentation has been reviewed and is accurate and complete Lynne Leader, M.D.

## 2020-10-30 ENCOUNTER — Other Ambulatory Visit: Payer: Self-pay

## 2020-10-30 ENCOUNTER — Ambulatory Visit: Payer: Medicare HMO | Admitting: Family Medicine

## 2020-10-30 VITALS — BP 116/76 | Ht 69.0 in | Wt 146.2 lb

## 2020-10-30 DIAGNOSIS — M79672 Pain in left foot: Secondary | ICD-10-CM

## 2020-10-30 NOTE — Patient Instructions (Signed)
Thank you for coming in today. Plan for MRI.  Recheck after the MRI  Use the tramadol sparingly.  Gabapentin is for nerve pain. I am not sure that you have nerve pain.  You should hear soon about scheduling the MRI.

## 2020-11-10 LAB — COMPREHENSIVE METABOLIC PANEL
Albumin: 3.6 (ref 3.5–5.0)
Calcium: 8.8 (ref 8.7–10.7)

## 2020-11-10 LAB — CBC AND DIFFERENTIAL
HCT: 34 — AB (ref 41–53)
Hemoglobin: 11 — AB (ref 13.5–17.5)
Neutrophils Absolute: 7.4
Platelets: 265 (ref 150–399)
WBC: 10.2

## 2020-11-10 LAB — BASIC METABOLIC PANEL
BUN: 21 (ref 4–21)
CO2: 28 — AB (ref 13–22)
Chloride: 104 (ref 99–108)
Creatinine: 2.8 — AB (ref 0.6–1.3)
Glucose: 97
Potassium: 4.6 (ref 3.4–5.3)
Sodium: 141 (ref 137–147)

## 2020-11-10 LAB — CBC: RBC: 3.68 — AB (ref 3.87–5.11)

## 2020-11-11 ENCOUNTER — Other Ambulatory Visit: Payer: Self-pay | Admitting: Nephrology

## 2020-11-11 DIAGNOSIS — H814 Vertigo of central origin: Secondary | ICD-10-CM

## 2020-11-19 ENCOUNTER — Other Ambulatory Visit: Payer: Self-pay | Admitting: Pulmonary Disease

## 2020-11-21 ENCOUNTER — Encounter: Payer: Self-pay | Admitting: Family Medicine

## 2020-11-24 NOTE — Telephone Encounter (Signed)
Patient is scheduled for Friday, January 14th.

## 2020-12-05 ENCOUNTER — Ambulatory Visit: Payer: Medicare HMO | Admitting: Family Medicine

## 2020-12-06 ENCOUNTER — Ambulatory Visit
Admission: RE | Admit: 2020-12-06 | Discharge: 2020-12-06 | Disposition: A | Discharge status: Home / Self Care | Payer: Medicare HMO | Source: Ambulatory Visit | Attending: Nephrology | Admitting: Nephrology

## 2020-12-06 ENCOUNTER — Other Ambulatory Visit: Payer: Self-pay

## 2020-12-06 DIAGNOSIS — H814 Vertigo of central origin: Secondary | ICD-10-CM

## 2020-12-15 ENCOUNTER — Other Ambulatory Visit: Payer: Self-pay

## 2020-12-15 ENCOUNTER — Encounter: Payer: Self-pay | Admitting: Family Medicine

## 2020-12-15 MED ORDER — ONDANSETRON 4 MG PO TBDP: ORAL_TABLET | ORAL | 1 refills | Status: DC

## 2021-01-13 ENCOUNTER — Encounter: Payer: Self-pay | Admitting: Neurology

## 2021-01-13 ENCOUNTER — Ambulatory Visit: Payer: Medicare HMO | Admitting: Neurology

## 2021-01-13 ENCOUNTER — Other Ambulatory Visit: Payer: Self-pay

## 2021-01-13 ENCOUNTER — Telehealth: Payer: Self-pay | Admitting: Neurology

## 2021-01-13 VITALS — BP 151/80 | HR 68 | Ht 69.0 in | Wt 144.2 lb

## 2021-01-13 DIAGNOSIS — R42 Dizziness and giddiness: Secondary | ICD-10-CM | POA: Diagnosis not present

## 2021-01-13 DIAGNOSIS — R2681 Unsteadiness on feet: Secondary | ICD-10-CM

## 2021-01-13 DIAGNOSIS — G459 Transient cerebral ischemic attack, unspecified: Secondary | ICD-10-CM

## 2021-01-13 DIAGNOSIS — G629 Polyneuropathy, unspecified: Secondary | ICD-10-CM | POA: Diagnosis not present

## 2021-01-13 MED ORDER — TOPIRAMATE 25 MG PO TABS: 25.0000 mg | ORAL_TABLET | Freq: Two times a day (BID) | ORAL | 3 refills | Status: DC

## 2021-01-13 NOTE — Progress Notes (Addendum)
[facility] [address] [place]. [place] [address] [phone]       OFFICE CONSULT NOTE  [name] Date of Birth:  [birth year] Medical Record Number:  [id number]   Referring MD: Edrick Oh Reason for Referral: Dizziness and gait difficulty HPI: [name] is a pleasant 70 year old Caucasian male seen today for initial office consultation visit.  History is obtained from the patient and review of electronic medical records and reviewed pertinent imaging films in PACS.  Patient has past medical history of coronary artery disease, chronic kidney disease, COPD and benign essential tremor.  He states the last 2 months he is having increasing dizziness and imbalance and falling sensation to the right.  Is most able to catch himself and has had no major falls or injury.  Of late is also noticed some dizziness with leaning to the left.  He also admits that when his trying to paint and raise his arms above his shoulders and turn his neck back he feels dizzy and off balance and has to catch himself.  He denies any vertigo, tingling numbness slurred speech double vision or blurred vision.  Patient states he had sudden onset of left ear hearing loss about 3 weeks after he got his COVID vaccine second dose in March.  His hearing has not come back since then.  He is now started noting mild tinnitus in that ear off and on.  Patient on inquiry admits to tingling and numbness and burning in his feet for the last 3 months.  This has gotten worse and is now like a constant pressure-like sensation.  This does not preclude increase at night is present throughout the day even when he is walking or working.  It seems to be slightly better in the last few weeks.  Is complaining of some leg swelling off and on.  Patient states his feeling of imbalance and dizziness not constant and occurs occasionally without any specific triggers.  It can occur anytime during the day even when he is woken up  versus being tired.  He has noticed slight increase of his gait imbalance at night and darkness.  He has no prior history of strokes TIA seizures or significant neurological problems.  He does have history of benign essential tremor for which she saw me in 2017.  He had tried atenolol at that time it did not work.  I had suggested Topamax but he never took that and did not follow-up.  He admits that had a drink of alcohol does help calm down his tremors.  He had a history of one unprovoked seizure 40 years ago and saw a neurologist and started on Dilantin but he is stopped it after a year and has not had no further recurrent seizures.  Patient had an outpatient MRI scan of the brain done on 12/13/2010 which showed mild generalized atrophy and changes of small vessel disease without any acute abnormality. ROS:   14 system review of systems is positive for gait instability, imbalance, numbness, tremors all other systems negative  PMH:  Past Medical History:  Diagnosis Date  . Anxiety   . Arthritis    "left knee" (08/05/2016)  . Bruises easily   . CAD (coronary artery disease) of artery bypass graft 08/05/2016   Around age 32. CABG - 3 vessel.   . Chronic kidney disease   . Colon polyp   . COPD, severe ([place]) 08/23/2016   Alpha 1 studies normal per prior pulmonary group notes  Smoked 40 years, quit in 2012 June 2016 PFT from prior pulmonary group: "significant obstruction" FEV1 1.58L (47% pred), Residual volume 171% pred, DLCO 59% pred Simple Spirometry>> 09/15/2016 ratio 42% FEV1 1.02 L / 31%   . Emphysema of lung ([place])   . Essential tremor 06/11/2016   Plans to see Dr. Carles Collet  . Former smoker 09/01/2016   Quit 2012. 40 pack years at least.   . GERD (gastroesophageal reflux disease)   . Headache   . History of blood transfusion 08/1997   "when he had his heart surgery"  . History of shingles 1970-2013 X 3  . HTN (hypertension) 08/05/2016   Amlodipine 5mg , atenolol 50mg  BID, benazepril 20mg   .  Hyperlipemia   . Hypothyroidism   . Lumbar disc disease   . MGUS (monoclonal gammopathy of unknown significance)   . Myocardial infarction ([place]) 08/1997  . Peripheral vascular disease ([place])   . PONV (postoperative nausea and vomiting)   . Urinary tract infection 09/03/2017  . Wears glasses     Social History:  Social History   Socioeconomic History  . Marital status: Married    Spouse name: Not on file  . Number of children: 2  . Years of education: 60  . Highest education level: Not on file  Occupational History  . Occupation: Retired   Tobacco Use  . Smoking status: Former Smoker    Packs/day: 1.00    Years: 45.00    Pack years: 45.00    Types: Cigarettes    Quit date: 07/10/2017    Years since quitting: 3.5  . Smokeless tobacco: Never Used  Vaping Use  . Vaping Use: Never used  Substance and Sexual Activity  . Alcohol use: Yes    Alcohol/week: 14.0 standard drinks    Types: 14 Shots of liquor per week    Comment: daily 2 shots of vodka  . Drug use: No  . Sexual activity: Not Currently  Other Topics Concern  . Not on file  Social History Narrative   Lives with wife. 2 adopted daughters from [place].       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 acetaminophen (TYLENOL) 500 MG tablet Take 500-1,000 mg by mouth every 6 (six) hours as needed (for pain).     Marland Kitchen albuterol (PROVENTIL) (2.5 MG/3ML) 0.083% nebulizer solution Take 3 mLs (2.5 mg total) by  nebulization every 6 (six) hours as needed for wheezing or shortness of breath. J44.9 300 mL 11  . albuterol (VENTOLIN HFA) 108 (90 Base) MCG/ACT inhaler Inhale 2 puffs into the lungs every 4 (four) hours as needed for wheezing or shortness of breath. 18 g 3  . aspirin EC 81 MG tablet Take 81 mg by mouth daily.    Marland Kitchen atenolol (TENORMIN) 50 MG tablet TAKE 1 TABLET DAILY 90 tablet 3  . budesonide-formoterol (SYMBICORT) 160-4.5 MCG/ACT inhaler  USE 2 INHALATIONS TWICE A DAY 30.6 g 3  . famotidine (PEPCID) 20 MG tablet Take 1 tablet (20 mg total) by mouth 2 (two) times daily. 180 tablet 1  . ondansetron (ZOFRAN-ODT) 4 MG disintegrating tablet PLACE 1 TO 2 TABLETS ON TONGUE AND ALLOW TO DISSOLVE EVERY 8 HOURS AS NEEDED FOR NAUSEA / VOMITING 60 tablet 1  . predniSONE (DELTASONE) 10 MG tablet Take 3 tablets (30 mg total) by mouth daily with breakfast. 15 tablet 0  . rosuvastatin (CRESTOR) 40 MG tablet TAKE 1 TABLET DAILY (NEED APPOINTMENT) 90 tablet 3  . SYNTHROID 112 MCG tablet TAKE 1 TABLET DAILY BEFORE BREAKFAST 90 tablet 3  . Tiotropium Bromide Monohydrate (SPIRIVA RESPIMAT) 2.5 MCG/ACT AERS USE 2 INHALATIONS DAILY 12 g 3  . traMADol (ULTRAM) 50 MG tablet TAKE 1 TABLET (50 MG TOTAL) BY MOUTH 2 (TWO) TIMES DAILY AS NEEDED FOR MODERATE PAIN OR SEVERE PAIN. 60 tablet 5   No current facility-administered medications on file prior to visit.    Allergies:   Allergies  Allergen Reactions  . Contrast Media [Iodinated Diagnostic Agents] Other (See Comments)    NOT ALLOWED DUE TO KIDNEY ISSUES.  . Other Itching and Rash    Gel used for ultrasound SEVERELY broke out the skin   . Keflex [Cephalexin] Diarrhea, Nausea Only and Other (See Comments)    Insomnia  . Sulfa Antibiotics Other (See Comments)    Insomnia  . Ciprofloxacin Diarrhea  . Levaquin [Levofloxacin] Other (See Comments)    Insomnia   . Omeprazole Diarrhea    Physical Exam General: well developed, well nourished pleasant elderly  Caucasian male, seated, in no evident distress Head: head normocephalic and atraumatic.   Neck: supple with no carotid or supraclavicular bruits Cardiovascular: regular rate and rhythm, no murmurs Musculoskeletal: no deformity Skin:  no rash/petichiae Vascular:  Normal pulses all extremities  Neurologic Exam Mental Status: Awake and fully alert. Oriented to place and time. Recent and remote memory intact. Attention span, concentration and fund of knowledge appropriate. Mood and affect appropriate.  Cranial Nerves: Fundoscopic exam reveals sharp disc margins. Pupils equal, briskly reactive to light. Extraocular movements full without nystagmus.  .  Visual fields full to confrontation. Hearing diminished bilaterally facial sensation intact. Face, tongue, palate moves normally and symmetrically.  Motor: Normal bulk and tone. Normal strength in all tested extremity muscles.  Mild bilateral fine action tremor of outstretched upper extremities which is absent at rest and does not increase with intention.  No cogwheel rigidity, bradykinesia or pill-rolling tremor noted. Sensory.: intact to touch , pinprick sensation but diminished, position and vibratory sensation.  Bilaterally from ankle down. Coordination: Rapid alternating movements normal in all extremities. Finger-to-nose and heel-to-shin performed accurately bilaterally. Gait and Station: Arises from chair without difficulty. Stance is normal. Gait demonstrates normal stride length and slight dragging of the right leg.  Unsteady while standing on either foot unsupported..  Not able to heel, toe and tandem walk without difficulty.  Reflexes: 1+ and symmetric except both ankle jerks are depressed.. Toes downgoing.      ASSESSMENT: 70 year old Caucasian male with multifocal symptoms of dizziness and gait instability, leg weakness and numbness likely due to combination of degenerative spine disease with underlying radiculopathy/neuropathy.  He also  has longstanding history of longstanding essential tremor which appears quite stable.  Remote history of unprovoked seizure 40 years ago without any recurrence     PLAN:  I had a long discussion with the patient regarding his gait instability,  hand tremors and feet dysesthesias and discuss evaluation and treatment plan and answered questions.  I recommend further evaluation with checking MRI scan of cervical spine for compressive myelopathy, MRA of the brain and neck for vertebrobasilar insufficiency, EEG given his prior history of seizures, neuropathy panel labs and EMG nerve conduction study.  I recommend he try Topamax 25 mg twice daily to help with his feet paresthesias as well as essential hand tremor.  I discussed possible side effects with him as well.  Also discussed fall prevention precautions with him.  Return for follow-up with me in 2 months or call earlier if necessary.  Greater than 50% time during this 45-minute consultation visit was spent on counseling and coordination of care about his gait instability, and tremors, feet paresthesias and weakness and answering questions. Antony Contras, MD  [facility] [address] [place], [place] [address]  Phone [phone] Fax [phone] Note: This document was prepared with digital dictation and possible smart phrase technology. Any transcriptional errors that result from this process are unintentional.

## 2021-01-13 NOTE — Telephone Encounter (Signed)
[employer] medicare order sent to [facility]. They will obtain the auth and reach out to the patient to schedule.  °

## 2021-01-13 NOTE — Patient Instructions (Signed)
I had a long discussion with the patient regarding his gait instability, hand tremors and feet dysesthesias and discuss evaluation and treatment plan and answered questions.  I recommend further evaluation with checking MRI scan of cervical spine for compressive myelopathy, MRA of the brain and neck for vertebrobasilar insufficiency, EEG given his prior history of seizures, neuropathy panel labs and EMG nerve conduction study.  I recommend he try Topamax 25 mg twice daily to help with his feet paresthesias as well as essential hand tremor.  I discussed possible side effects with him as well.  Also discussed fall prevention precautions with him.  Return for follow-up with me in 2 months or call earlier if necessary.  Fall Prevention in the Home, Adult Falls can cause injuries and can happen to people of all ages. There are many things you can do to make your home safe and to help prevent falls. Ask for help when making these changes. What actions can I take to prevent falls? General Instructions  Use good lighting in all rooms. Replace any light bulbs that burn out.  Turn on the lights in dark areas. Use night-lights.  Keep items that you use often in easy-to-reach places. Lower the shelves around your home if needed.  Set up your furniture so you have a clear path. Avoid moving your furniture around.  Do not have throw rugs or other things on the floor that can make you trip.  Avoid walking on wet floors.  If any of your floors are uneven, fix them.  Add color or contrast paint or tape to clearly mark and help you see: ? Grab bars or handrails. ? First and last steps of staircases. ? Where the edge of each step is.  If you use a stepladder: ? Make sure that it is fully opened. Do not climb a closed stepladder. ? Make sure the sides of the stepladder are locked in place. ? Ask someone to hold the stepladder while you use it.  Know where your pets are when moving through your home. What  can I do in the bathroom?  Keep the floor dry. Clean up any water on the floor right away.  Remove soap buildup in the tub or shower.  Use nonskid mats or decals on the floor of the tub or shower.  Attach bath mats securely with double-sided, nonslip rug tape.  If you need to sit down in the shower, use a plastic, nonslip stool.  Install grab bars by the toilet and in the tub and shower. Do not use towel bars as grab bars.      What can I do in the bedroom?  Make sure that you have a light by your bed that is easy to reach.  Do not use any sheets or blankets for your bed that hang to the floor.  Have a firm chair with side arms that you can use for support when you get dressed. What can I do in the kitchen?  Clean up any spills right away.  If you need to reach something above you, use a step stool with a grab bar.  Keep electrical cords out of the way.  Do not use floor polish or wax that makes floors slippery. What can I do with my stairs?  Do not leave any items on the stairs.  Make sure that you have a light switch at the top and the bottom of the stairs.  Make sure that there are handrails on both sides  of the stairs. Fix handrails that are broken or loose.  Install nonslip stair treads on all your stairs.  Avoid having throw rugs at the top or bottom of the stairs.  Choose a carpet that does not hide the edge of the steps on the stairs.  Check carpeting to make sure that it is firmly attached to the stairs. Fix carpet that is loose or worn. What can I do on the outside of my home?  Use bright outdoor lighting.  Fix the edges of walkways and driveways and fix any cracks.  Remove anything that might make you trip as you walk through a door, such as a raised step or threshold.  Trim any bushes or trees on paths to your home.  Check to see if handrails are loose or broken and that both sides of all steps have handrails.  Install guardrails along the edges  of any raised decks and porches.  Clear paths of anything that can make you trip, such as tools or rocks.  Have leaves, snow, or ice cleared regularly.  Use sand or salt on paths during winter.  Clean up any spills in your garage right away. This includes grease or oil spills. What other actions can I take?  Wear shoes that: ? Have a low heel. Do not wear high heels. ? Have rubber bottoms. ? Feel good on your feet and fit well. ? Are closed at the toe. Do not wear open-toe sandals.  Use tools that help you move around if needed. These include: ? Canes. ? Walkers. ? Scooters. ? Crutches.  Review your medicines with your doctor. Some medicines can make you feel dizzy. This can increase your chance of falling. Ask your doctor what else you can do to help prevent falls. Where to find more information  Centers for Disease Control and Prevention, STEADI: http://www.wolf.info/  National Institute on Aging: http://kim-miller.com/ Contact a doctor if:  You are afraid of falling at home.  You feel weak, drowsy, or dizzy at home.  You fall at home. Summary  There are many simple things that you can do to make your home safe and to help prevent falls.  Ways to make your home safe include removing things that can make you trip and installing grab bars in the bathroom.  Ask for help when making these changes in your home. This information is not intended to replace advice given to you by your health care provider. Make sure you discuss any questions you have with your health care provider. Document Revised: 06/11/2020 Document Reviewed: 06/11/2020 Elsevier Patient Education  [place].

## 2021-01-14 NOTE — Telephone Encounter (Signed)
Okay to change the order to MRI without contrast only

## 2021-01-14 NOTE — Addendum Note (Signed)
Addended by: Rhae Lerner R on: 01/14/2021 03:26 PM   Modules accepted: Orders

## 2021-01-15 LAB — NEUROPATHY PANEL
A/G Ratio: 1.2 (ref 0.7–1.7)
Albumin ELP: 3.6 g/dL (ref 2.9–4.4)
Alpha 1: 0.2 g/dL (ref 0.0–0.4)
Alpha 2: 1 g/dL (ref 0.4–1.0)
Angio Convert Enzyme: 64 U/L (ref 14–82)
Anti Nuclear Antibody (ANA): NEGATIVE
Beta: 0.8 g/dL (ref 0.7–1.3)
Gamma Globulin: 1.1 g/dL (ref 0.4–1.8)
Globulin, Total: 3.1 g/dL (ref 2.2–3.9)
Rheumatoid fact SerPl-aCnc: 67.2 IU/mL — ABNORMAL HIGH (ref <14.0)
Sed Rate: 19 mm/hr (ref 0–30)
TSH: 0.507 u[IU]/mL (ref 0.450–4.500)
Total Protein: 6.7 g/dL (ref 6.0–8.5)
Vit D, 25-Hydroxy: 14.9 ng/mL — ABNORMAL LOW (ref 30.0–100.0)
Vitamin B-12: 230 pg/mL — ABNORMAL LOW (ref 232–1245)

## 2021-01-16 ENCOUNTER — Ambulatory Visit
Admission: RE | Admit: 2021-01-16 | Discharge: 2021-01-16 | Disposition: A | Discharge status: Home / Self Care | Payer: Medicare HMO | Source: Ambulatory Visit | Attending: Neurology | Admitting: Neurology

## 2021-01-16 ENCOUNTER — Other Ambulatory Visit: Payer: Self-pay

## 2021-01-16 DIAGNOSIS — R2681 Unsteadiness on feet: Secondary | ICD-10-CM

## 2021-01-16 DIAGNOSIS — R42 Dizziness and giddiness: Secondary | ICD-10-CM

## 2021-01-16 DIAGNOSIS — G459 Transient cerebral ischemic attack, unspecified: Secondary | ICD-10-CM

## 2021-01-16 MED ORDER — GADOBENATE DIMEGLUMINE 529 MG/ML IV SOLN: 13.0000 mL | Freq: Once | INTRAVENOUS | Status: DC | PRN

## 2021-01-20 ENCOUNTER — Other Ambulatory Visit: Payer: Self-pay | Admitting: Family Medicine

## 2021-01-20 DIAGNOSIS — E538 Deficiency of other specified B group vitamins: Secondary | ICD-10-CM | POA: Insufficient documentation

## 2021-01-20 DIAGNOSIS — E559 Vitamin D deficiency, unspecified: Secondary | ICD-10-CM

## 2021-01-20 MED ORDER — VITAMIN D (ERGOCALCIFEROL) 1.25 MG (50000 UNIT) PO CAPS: 50000.0000 [IU] | ORAL_CAPSULE | ORAL | 0 refills | Status: DC

## 2021-01-20 NOTE — Progress Notes (Signed)
Kindly inform the patient that vitamin B12 and vitamin D levels were low and 1 blood marker for rheumatoid was elevated and he needs to see primary care physician for treatment for the same

## 2021-01-22 ENCOUNTER — Telehealth: Payer: Self-pay | Admitting: Neurology

## 2021-01-22 NOTE — Telephone Encounter (Signed)
Patient returned call and I was able to review the lab results with him.  Patient made aware that his primary care office is already aware and are addressing this for him as well.  Patient was appreciative for the call and verbalized understanding of the results.

## 2021-01-22 NOTE — Telephone Encounter (Signed)
Called the patient. There was no answer. LVM advising the patient to call back.

## 2021-01-22 NOTE — Telephone Encounter (Signed)
-----   Message from Garvin Fila, MD sent at 01/20/2021  5:29 PM EST ----- Kindly inform the patient that vitamin B12 and vitamin D levels were low and 1 blood marker for rheumatoid was elevated and he needs to see primary care physician for treatment for the same

## 2021-01-23 NOTE — Progress Notes (Signed)
Kindly inform the patient that MR angiogram study of the brain does not show any major blockages of the blood vessels the brain

## 2021-01-23 NOTE — Progress Notes (Signed)
Kindly inform the patient that MRI scan of the cervical spine shows moderate spinal stenosis between the fifth and sixth vertebrae with narrowing of the bony foramina through which the nerves come out more on the right than left side.  There is also mild spinal stenosis between the fourth and the fifth and third and fourth vertebrae as well.

## 2021-01-23 NOTE — Progress Notes (Signed)
Kindly inform the patient that MR angiogram study of the neck showed no major blockages.

## 2021-01-26 ENCOUNTER — Encounter: Payer: Self-pay | Admitting: *Deleted

## 2021-01-28 ENCOUNTER — Ambulatory Visit (INDEPENDENT_AMBULATORY_CARE_PROVIDER_SITE_OTHER): Payer: Medicare HMO

## 2021-01-28 ENCOUNTER — Other Ambulatory Visit: Payer: Self-pay

## 2021-01-28 DIAGNOSIS — E559 Vitamin D deficiency, unspecified: Secondary | ICD-10-CM

## 2021-01-28 DIAGNOSIS — E538 Deficiency of other specified B group vitamins: Secondary | ICD-10-CM | POA: Diagnosis not present

## 2021-01-28 MED ORDER — CYANOCOBALAMIN 1000 MCG/ML IJ SOLN
1000.0000 ug | Freq: Once | INTRAMUSCULAR | Status: AC
  Administered 2021-01-28: 1000 ug via INTRAMUSCULAR

## 2021-01-28 NOTE — Progress Notes (Signed)
I have reviewed and agree with note, evaluation, plan.   Liyat Faulkenberry, MD  

## 2021-01-28 NOTE — Progress Notes (Signed)
Brian Graham 70 yr old male presents to office today for B12 injection per Garret Reddish, MD. Once weekly for 4 weeks, then once monthly. Administered CYANOCOBALAMIN 1,000 mcg IM left arm. Patient tolerated well. Aware to return next week for 2nd weekly injection.

## 2021-01-29 ENCOUNTER — Ambulatory Visit (INDEPENDENT_AMBULATORY_CARE_PROVIDER_SITE_OTHER): Payer: Medicare HMO | Admitting: Diagnostic Neuroimaging

## 2021-01-29 ENCOUNTER — Encounter: Payer: Medicare HMO | Admitting: Diagnostic Neuroimaging

## 2021-01-29 ENCOUNTER — Telehealth: Payer: Self-pay | Admitting: Emergency Medicine

## 2021-01-29 ENCOUNTER — Other Ambulatory Visit: Payer: Self-pay | Admitting: Family Medicine

## 2021-01-29 DIAGNOSIS — G629 Polyneuropathy, unspecified: Secondary | ICD-10-CM

## 2021-01-29 DIAGNOSIS — Z0289 Encounter for other administrative examinations: Secondary | ICD-10-CM

## 2021-01-29 NOTE — Telephone Encounter (Signed)
-----   Message from Garvin Fila, MD sent at 01/23/2021  5:27 PM EST ----- Kindly inform the patient that MRI scan of the cervical spine shows moderate spinal stenosis between the fifth and sixth vertebrae with narrowing of the bony foramina through which the nerves come out more on the right than left side.  There is also mild spinal stenosis between the fourth and the fifth and third and fourth vertebrae as well.

## 2021-01-29 NOTE — Telephone Encounter (Signed)
Called patient and discussed Dr. Clydene Fake findings regarding the MRI of the cervical spine.    Patient denied further questions, verbalized understanding and expressed appreciation for the phone call.

## 2021-02-03 ENCOUNTER — Ambulatory Visit: Payer: Medicare HMO | Admitting: Neurology

## 2021-02-03 ENCOUNTER — Other Ambulatory Visit: Payer: Self-pay

## 2021-02-03 DIAGNOSIS — R55 Syncope and collapse: Secondary | ICD-10-CM | POA: Diagnosis not present

## 2021-02-03 DIAGNOSIS — R42 Dizziness and giddiness: Secondary | ICD-10-CM

## 2021-02-03 NOTE — Procedures (Signed)
[facility]  NCS (NERVE CONDUCTION STUDY) WITH EMG (ELECTROMYOGRAPHY) REPORT   STUDY DATE: 01/29/21 PATIENT NAME: Brian Graham DOB: 1951/04/17 MRN: [id number]  ORDERING CLINICIAN: Antony Contras, MD   TECHNOLOGIST: Sherre Scarlet ELECTROMYOGRAPHER: Earlean Polka. Yara Tomkinson, MD  CLINICAL INFORMATION: 70 year old male with bilateral lower extremity pain and weakness.  FINDINGS: NERVE CONDUCTION STUDY:  Bilateral peroneal motor responses have decreased amplitudes, normal conduction velocities.  Bilateral tibial motor responses are normal.  Bilateral sural and superficial peroneal sensory responses are normal.  Left tibial F-wave latency is normal.  Right tibial F-wave latency is borderline prolonged.   NEEDLE ELECTROMYOGRAPHY:  Needle examination of right lower extremity and right lumbar paraspinal muscles is normal.   IMPRESSION:   This study demonstrates: - Bilateral peroneal motor responses of decreased amplitudes, may be related to underlying axonal neuropathies versus lumbar radiculopathies. - No underlying widespread sensory neuropathy.    INTERPRETING PHYSICIAN:  Penni Bombard, MD Certified in Neurology, Neurophysiology and Neuroimaging  [facility] [address] [place], [place] [address] [phone]   [facility]    Nerve / Sites Muscle Latency Ref. Amplitude Ref. Rel Amp Segments Distance Velocity Ref. Area    ms ms mV mV %  cm m/s m/s mVms  L Peroneal - EDB     Ankle EDB 5.3 ?6.5 1.8 ?2.0 100 Ankle - EDB 9   7.8     Fib head EDB 11.4  1.6  86.5 Fib head - Ankle 27 44 ?44 4.7     Pop fossa EDB 13.7  1.7  108 Pop fossa - Fib head 10 44 ?44 9.7         Pop fossa - Ankle      R Peroneal - EDB     Ankle EDB 3.9 ?6.5 1.9 ?2.0 100 Ankle - EDB 9   8.5     Fib head EDB 10.1  1.2  61.5 Fib head - Ankle 27 44 ?44 4.4     Pop fossa EDB 12.4  1.7  143 Pop fossa - Fib head 10 44 ?44 6.4         Pop fossa - Ankle      L  Tibial - AH     Ankle AH 5.3 ?5.8 4.8 ?4.0 100 Ankle - AH 9   13.7     Pop fossa AH 14.1  3.8  78.6 Pop fossa - Ankle 36 41 ?41 19.1  R Tibial - AH     Ankle AH 4.4 ?5.8 4.4 ?4.0 100 Ankle - AH 9   13.1     Pop fossa AH 13.7  2.8  63.4 Pop fossa - Ankle 38 41 ?41 13.1             SNC    Nerve / Sites Rec. Site Peak Lat Ref.  Amp Ref. Segments Distance    ms ms V V  cm  L Sural - Ankle (Calf)     Calf Ankle 4.3 ?4.4 8 ?6 Calf - Ankle 14  R Sural - Ankle (Calf)     Calf Ankle 3.5 ?4.4 6 ?6 Calf - Ankle 14  L Superficial peroneal - Ankle     Lat leg Ankle 4.1 ?4.4 6 ?6 Lat leg - Ankle 14  R Superficial peroneal - Ankle     Lat leg Ankle 4.3 ?4.4 6 ?6 Lat leg - Ankle 14             F  Wave    Nerve F Lat Ref.   ms ms  L Tibial - AH 55.6 ?56.0  R Tibial - AH 58.1 ?56.0         EMG Summary Table    Spontaneous MUAP Recruitment  Muscle IA Fib PSW Fasc Other Amp Dur. Poly Pattern  R. Vastus medialis Normal None None None _______ Normal Normal Normal Normal  R. Tibialis anterior Normal None None None _______ Normal Normal Normal Normal  R. Gastrocnemius (Medial head) Normal None None None _______ Normal Normal Normal Normal  R. Lumbar paraspinals Normal None None None _______ Normal Normal Normal Normal

## 2021-02-04 ENCOUNTER — Other Ambulatory Visit: Payer: Self-pay

## 2021-02-04 ENCOUNTER — Ambulatory Visit (INDEPENDENT_AMBULATORY_CARE_PROVIDER_SITE_OTHER): Payer: Medicare HMO

## 2021-02-04 DIAGNOSIS — E538 Deficiency of other specified B group vitamins: Secondary | ICD-10-CM

## 2021-02-04 MED ORDER — CYANOCOBALAMIN 1000 MCG/ML IJ SOLN
1000.0000 ug | Freq: Once | INTRAMUSCULAR | Status: AC
  Administered 2021-02-04: 1000 ug via INTRAMUSCULAR

## 2021-02-04 NOTE — Progress Notes (Signed)
I have reviewed and agree with note, evaluation, plan.   Mclean Moya, MD  

## 2021-02-04 NOTE — Progress Notes (Signed)
Brian Graham 70 yr old male presents to office today for B12 injection per Garret Reddish, MD. Once weekly for 4 weeks, then once monthly. Administered CYANOCOBALAMIN 1,000 mcg IM left arm. Patient tolerated well. Aware to return next week for 3rd injection

## 2021-02-08 ENCOUNTER — Other Ambulatory Visit: Payer: Self-pay | Admitting: Neurology

## 2021-02-08 DIAGNOSIS — M5410 Radiculopathy, site unspecified: Secondary | ICD-10-CM

## 2021-02-08 NOTE — Progress Notes (Signed)
Kindly inform the patient that electrical study of the nerves of the legs su suggests mild damage but it is unclear if this is coming from pinched nerve in the back or not.  Given prior history of some spinal stenosis in his previous neck MRI I am going to order MRI scan of the back to look for pinched nerve

## 2021-02-09 ENCOUNTER — Telehealth: Payer: Self-pay | Admitting: Neurology

## 2021-02-09 ENCOUNTER — Ambulatory Visit: Payer: Medicare HMO | Admitting: Neurology

## 2021-02-09 NOTE — Telephone Encounter (Signed)
[employer] medicare order sent to [facility]. They will obtain the auth and reach out to the patient to schedule.  °

## 2021-02-11 ENCOUNTER — Ambulatory Visit (INDEPENDENT_AMBULATORY_CARE_PROVIDER_SITE_OTHER): Payer: Medicare HMO

## 2021-02-11 ENCOUNTER — Other Ambulatory Visit: Payer: Self-pay

## 2021-02-11 ENCOUNTER — Encounter: Payer: Self-pay | Admitting: *Deleted

## 2021-02-11 DIAGNOSIS — E538 Deficiency of other specified B group vitamins: Secondary | ICD-10-CM

## 2021-02-11 MED ORDER — CYANOCOBALAMIN 1000 MCG/ML IJ SOLN
1000.0000 ug | Freq: Once | INTRAMUSCULAR | Status: AC
  Administered 2021-02-11: 1000 ug via INTRAMUSCULAR

## 2021-02-11 NOTE — Progress Notes (Signed)
Brian Graham 70 yr old male presents to office today for B12 injection per Garret Reddish, MD. Once weekly for 4 weeks, then once monthly. Administered CYANOCOBALAMIN 1,000 mcg IM left arm. Patient tolerated well. Aware to return next week for 4th injection.

## 2021-02-13 NOTE — Progress Notes (Signed)
Kindly inform the patient that EEG study was normal

## 2021-02-17 ENCOUNTER — Telehealth: Payer: Self-pay

## 2021-02-17 ENCOUNTER — Other Ambulatory Visit: Payer: Self-pay | Admitting: Family Medicine

## 2021-02-17 NOTE — Telephone Encounter (Signed)
Per Dr. Clydene Fake note on pt's EEG: "Kindly inform the patient that EEG study was normal."  I called patient.  I advised him that his EEG was normal.  I reminded him of his appointment scheduled in June.  Patient verbalized understanding of results.  Patient had no questions or concerns at this time.

## 2021-02-18 ENCOUNTER — Ambulatory Visit (INDEPENDENT_AMBULATORY_CARE_PROVIDER_SITE_OTHER): Payer: Medicare HMO

## 2021-02-18 ENCOUNTER — Other Ambulatory Visit: Payer: Self-pay

## 2021-02-18 DIAGNOSIS — E538 Deficiency of other specified B group vitamins: Secondary | ICD-10-CM

## 2021-02-18 MED ORDER — CYANOCOBALAMIN 1000 MCG/ML IJ SOLN
1000.0000 ug | Freq: Once | INTRAMUSCULAR | Status: AC
  Administered 2021-02-18: 1000 ug via INTRAMUSCULAR

## 2021-02-18 NOTE — Progress Notes (Signed)
Per orders of Dr. [name], injection of B-12 given by Tobe Sos in left deltoid. Patient tolerated injection well. Patient will make appointment for 1 week.

## 2021-02-20 ENCOUNTER — Other Ambulatory Visit: Payer: Self-pay | Admitting: Pulmonary Disease

## 2021-02-21 ENCOUNTER — Other Ambulatory Visit: Payer: Self-pay | Admitting: Family Medicine

## 2021-02-25 ENCOUNTER — Ambulatory Visit (INDEPENDENT_AMBULATORY_CARE_PROVIDER_SITE_OTHER): Payer: Medicare HMO

## 2021-02-25 ENCOUNTER — Other Ambulatory Visit: Payer: Self-pay

## 2021-02-25 DIAGNOSIS — E538 Deficiency of other specified B group vitamins: Secondary | ICD-10-CM | POA: Diagnosis not present

## 2021-02-25 MED ORDER — CYANOCOBALAMIN 1000 MCG/ML IJ SOLN
1000.0000 ug | Freq: Once | INTRAMUSCULAR | Status: AC
  Administered 2021-02-25: 1000 ug via INTRAMUSCULAR

## 2021-02-25 NOTE — Progress Notes (Signed)
Per orders of Dr. [name], injection of B-12 given by Tobe Sos in left deltoid. Patient tolerated injection well. Patient will make appointment for 1 week.

## 2021-03-04 ENCOUNTER — Ambulatory Visit (INDEPENDENT_AMBULATORY_CARE_PROVIDER_SITE_OTHER): Payer: Medicare HMO

## 2021-03-04 ENCOUNTER — Other Ambulatory Visit: Payer: Self-pay

## 2021-03-04 DIAGNOSIS — E538 Deficiency of other specified B group vitamins: Secondary | ICD-10-CM

## 2021-03-04 MED ORDER — CYANOCOBALAMIN 1000 MCG/ML IJ SOLN
1000.0000 ug | Freq: Once | INTRAMUSCULAR | Status: AC
  Administered 2021-03-04: 1000 ug via INTRAMUSCULAR

## 2021-03-04 NOTE — Progress Notes (Signed)
Per orders of Dr. Hunter, injection of B-12 given by Layan Zalenski D Lander Eslick in left deltoid. Patient tolerated injection well. Patient will make appointment for 1 month.  

## 2021-03-09 ENCOUNTER — Encounter: Payer: Self-pay | Admitting: Internal Medicine

## 2021-03-09 ENCOUNTER — Other Ambulatory Visit: Payer: Self-pay

## 2021-03-09 ENCOUNTER — Other Ambulatory Visit: Payer: Medicare HMO

## 2021-03-09 ENCOUNTER — Ambulatory Visit: Payer: Medicare HMO | Admitting: Internal Medicine

## 2021-03-09 VITALS — BP 142/70 | HR 72 | Ht 66.5 in | Wt 142.2 lb

## 2021-03-09 DIAGNOSIS — K5909 Other constipation: Secondary | ICD-10-CM | POA: Diagnosis not present

## 2021-03-09 DIAGNOSIS — R634 Abnormal weight loss: Secondary | ICD-10-CM

## 2021-03-09 DIAGNOSIS — R6881 Early satiety: Secondary | ICD-10-CM

## 2021-03-09 DIAGNOSIS — E538 Deficiency of other specified B group vitamins: Secondary | ICD-10-CM

## 2021-03-09 DIAGNOSIS — R630 Anorexia: Secondary | ICD-10-CM

## 2021-03-09 DIAGNOSIS — R110 Nausea: Secondary | ICD-10-CM | POA: Diagnosis not present

## 2021-03-09 MED ORDER — MIRTAZAPINE 15 MG PO TABS: 15.0000 mg | ORAL_TABLET | Freq: Every day | ORAL | 1 refills | Status: DC

## 2021-03-09 NOTE — Progress Notes (Signed)
Brian Graham 70 y.o. [birth year] [id number]  Assessment & Plan:   Encounter Diagnoses  Name Primary?  . Chronic nausea Yes  . Chronic constipation   . Anorexia   . Loss of weight   . Early satiety   . B12 deficiency     His problems persist.  When I had seen him before I consider mirtazapine we will try that now.  Ondansetron might be contributing to constipation.  He may be having vegetative signs and symptoms of depression.  Tramadol is not contraindicated but levels of that and mirtazapine could be increased with concomitant use.  He is aware I do not think to be an issue at this dosing.  Overall I have a strong suspicion of a GI functional syndrome and not serious organic illness in that respect.  His weight loss is mild.  Probably underlying anxiety plus or minus depression.  I will work-up B12 deficiency further.   Meds ordered this encounter  Medications  . mirtazapine (REMERON) 15 MG tablet    Sig: Take 1 tablet (15 mg total) by mouth at bedtime.    Dispense:  90 tablet    Refill:  1    Medications Discontinued During This Encounter  Medication Reason  . famotidine (PEPCID) 20 MG tablet Patient Preference  . predniSONE (DELTASONE) 10 MG tablet Completed Course  . topiramate (TOPAMAX) 25 MG tablet Patient Preference   Orders Placed This Encounter  Procedures  . Tissue transglutaminase, IgA  . Intrinsic Factor Antibodies  . Parietal Cell Antibody, Total    Return appointment on 05/06/2021  Subjective:   Chief Complaint: Nausea gas anorexia mild weight loss.  Constipation.  HPI [name] returns, I had seen him in March 2021 and at that point he was improved with respect to his nausea issues, he had undergone an EGD that was unrevealing with respect to loss of weight but did show some mild grade a esophagitis, gastric biopsies question atrophy with mild gastritis.  I put him on pantoprazole after that January procedure and in March he was improved.  We  decided not to pursue mirtazapine.  However in the interim he has had a lot of nausea in the morning, he is not moving his bowels too well, taking ondansetron most days with some benefit.  Appetite is off.  He has been diagnosed with B12 deficiency. Wt Readings from Last 3 Encounters:  03/09/21 142 lb 4 oz (64.5 kg)  01/13/21 144 lb 3.2 oz (65.4 kg)  10/30/20 146 lb 3.2 oz (66.3 kg)  01/2020 147 pounds   Allergies  Allergen Reactions  . Contrast Media [Iodinated Diagnostic Agents] Other (See Comments)    NOT ALLOWED DUE TO KIDNEY ISSUES.  . Other Itching and Rash    Gel used for ultrasound SEVERELY broke out the skin   . Keflex [Cephalexin] Diarrhea, Nausea Only and Other (See Comments)    Insomnia  . Sulfa Antibiotics Other (See Comments)    Insomnia  . Ciprofloxacin Diarrhea  . Levaquin [Levofloxacin] Other (See Comments)    Insomnia   . Omeprazole Diarrhea   Current Meds  Medication Sig  . acetaminophen (TYLENOL) 500 MG tablet Take 500-1,000 mg by mouth every 6 (six) hours as needed (for pain).   Marland Kitchen albuterol (PROVENTIL) (2.5 MG/3ML) 0.083% nebulizer solution Take 3 mLs (2.5 mg total) by nebulization every 6 (six) hours as needed for wheezing or shortness of breath. J44.9  . albuterol (VENTOLIN HFA) 108 (90 Base) MCG/ACT inhaler USE 2 INHALATIONS  EVERY 4 HOURS AS NEEDED  . aspirin EC 81 MG tablet Take 81 mg by mouth daily.  Marland Kitchen atenolol (TENORMIN) 50 MG tablet TAKE 1 TABLET DAILY  . budesonide-formoterol (SYMBICORT) 160-4.5 MCG/ACT inhaler USE 2 INHALATIONS TWICE A DAY  . levothyroxine (SYNTHROID) 112 MCG tablet TAKE 1 TABLET DAILY BEFORE BREAKFAST  . ondansetron (ZOFRAN-ODT) 4 MG disintegrating tablet PLACE 1 TO 2 TABLETS ON TONGUE AND ALLOW TO DISSOLVE EVERY 8 HOURS AS NEEDED FOR NAUSEA / VOMITING  . rosuvastatin (CRESTOR) 40 MG tablet TAKE 1 TABLET DAILY (NEED APPOINTMENT)  . SPIRIVA RESPIMAT 2.5 MCG/ACT AERS USE 2 INHALATIONS DAILY  . traMADol (ULTRAM) 50 MG tablet TAKE 1  TABLET (50 MG TOTAL) BY MOUTH 2 (TWO) TIMES DAILY AS NEEDED FOR MODERATE PAIN OR SEVERE PAIN.  Marland Kitchen Vitamin D, Ergocalciferol, (DRISDOL) 1.25 MG (50000 UNIT) CAPS capsule Take 1 capsule (50,000 Units total) by mouth every 7 (seven) days.   Past Medical History:  Diagnosis Date  . Anxiety   . Arthritis    "left knee" (08/05/2016)  . Bruises easily   . CAD (coronary artery disease) of artery bypass graft 08/05/2016   Around age 39. CABG - 3 vessel.   . Chronic kidney disease   . Colon polyp   . 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 Emphysema of lung ([place])   . Essential tremor 06/11/2016   Plans to see Dr. Carles Collet  . Former smoker 09/01/2016   Quit 2012. 40 pack years at least.   . GERD (gastroesophageal reflux disease)   . Headache   . History of blood transfusion 08/1997   "when he had his heart surgery"  . History of shingles 1970-2013 X 3  . HTN (hypertension) 08/05/2016   Amlodipine 5mg , atenolol 50mg  BID, benazepril 20mg   . Hyperlipemia   . Hypothyroidism   . Lumbar disc disease   . MGUS (monoclonal gammopathy of unknown significance)   . Myocardial infarction ([place]) 08/1997  . Peripheral vascular disease ([place])   . PONV (postoperative nausea and vomiting)   . Urinary tract infection 09/03/2017  . Wears glasses    Past Surgical History:  Procedure Laterality Date  . AORTOGRAM Right 08/31/2017   Procedure: AORTOGRAM BILATERAL PELVIC ANGIOGRAM WITH LEFT ILIAC ARTERY STENT;  Surgeon: Conrad Greeley Hill, MD;  Location: [facility];  Service: Vascular;  Laterality: Right;  . BACK SURGERY    . CARDIAC CATHETERIZATION  08/1997  . COLONOSCOPY  2014  . CORONARY ARTERY BYPASS GRAFT  09/12/1997   "triple"  .  ESOPHAGOGASTRODUODENOSCOPY    . INGUINAL HERNIA REPAIR Right 1961  . KIDNEY REMOVED    . KNEE RECONSTRUCTION Left 1960s - 1974 X 4  . LAPAROSCOPIC ABLATION RENAL MASS  ~ 2014   "large abscess/pus ball"  . [place]   "they were wedged in the bowel"  . PATELLA FRACTURE SURGERY Left 1963  . PATELLA FRACTURE SURGERY Left ~ 1965   "removed knee cap"  . ROBOT ASSISTED LAPAROSCOPIC NEPHRECTOMY Left 10/07/2017   Procedure: XI ROBOTIC ASSISTED LAPAROSCOPIC NEPHRECTOMY OF PELVIC KIDNEY;  Surgeon: Alexis Frock, MD;  Location: [facility];  Service: Urology;  Laterality: Left;  Place robot patient tower at foot of bed like  prostate per Dr. Tresa Moore. Pull extra staple loads.  . TONSILLECTOMY     Social History   Social History Narrative   Lives with wife. 2 adopted daughters from [place].       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   Review of Systems As above  Objective:   Physical Exam BP (!) 142/70 (BP Location: Left Arm, Patient Position: Sitting, Cuff Size: Normal)   Pulse 72   Ht 5' 6.5" (1.689 m) Comment: height measured without shoes  Wt 142 lb 4 oz (64.5 kg)   BMI 22.62 kg/m  NAD Lungs cta Cor NL abd soft small midline supraumbilical hernia Nauseous post abd exam Not flat affect Alert and orieted

## 2021-03-09 NOTE — Patient Instructions (Signed)
Your provider has requested that you go to the basement level for lab work before leaving today. Press "B" on the elevator. The lab is located at the first door on the left as you exit the elevator.  Due to recent changes in healthcare laws, you may see the results of your imaging and laboratory studies on MyChart before your provider has had a chance to review them.  We understand that in some cases there may be results that are confusing or concerning to you. Not all laboratory results come back in the same time frame and the provider may be waiting for multiple results in order to interpret others.  Please give [place] 48 hours in order for your provider to thoroughly review all the results before contacting the office for clarification of your results.   Take Miralax 1 capful daily  If you are age 16 or older, your body mass index should be between 23-30. Your Body mass index is 22.62 kg/m. If this is out of the aforementioned range listed, please consider follow up with your Primary Care Provider.  If you are age 71 or younger, your body mass index should be between 19-25. Your Body mass index is 22.62 kg/m. If this is out of the aformentioned range listed, please consider follow up with your Primary Care Provider.    I appreciate the  opportunity to care for you  Thank You   Ronney Lion

## 2021-03-11 ENCOUNTER — Ambulatory Visit: Payer: Medicare HMO

## 2021-03-12 LAB — TISSUE TRANSGLUTAMINASE, IGA: (tTG) Ab, IgA: <1 U/mL

## 2021-03-12 LAB — INTRINSIC FACTOR ANTIBODIES: Intrinsic Factor: NEGATIVE

## 2021-03-12 LAB — ANTI-PARIETAL ANTIBODY: PARIETAL CELL AB SCREEN: NEGATIVE

## 2021-03-18 ENCOUNTER — Ambulatory Visit (INDEPENDENT_AMBULATORY_CARE_PROVIDER_SITE_OTHER): Payer: Medicare HMO | Admitting: *Deleted

## 2021-03-18 ENCOUNTER — Other Ambulatory Visit: Payer: Self-pay

## 2021-03-18 ENCOUNTER — Telehealth: Payer: Self-pay | Admitting: *Deleted

## 2021-03-18 DIAGNOSIS — E538 Deficiency of other specified B group vitamins: Secondary | ICD-10-CM

## 2021-03-18 MED ORDER — CYANOCOBALAMIN 1000 MCG/ML IJ SOLN
1000.0000 ug | Freq: Once | INTRAMUSCULAR | Status: AC
  Administered 2021-03-25: 1000 ug via INTRAMUSCULAR

## 2021-03-18 NOTE — Telephone Encounter (Signed)
Please schedule lab visit and 6 month f/u for b12, future lab ordered.

## 2021-03-18 NOTE — Progress Notes (Signed)
Patient presents for B12 injection today. Patient received her B12 injection in Right Deltoid. Patient tolerated injection well.  Documentation entered in [facility] in [place].

## 2021-03-18 NOTE — Telephone Encounter (Signed)
Please check b12 under b12 deficiency. If levels are better we can likely change to oral- if still low normal may consider ongoing injections on monthly basis. Once a month should be fine on b12 until we get restuls  Id like to see him at least every 6 months so make sure he has had a visit within that timeframe

## 2021-03-18 NOTE — Telephone Encounter (Signed)
Patient will like to know when he need to return  or if he still need to continue with Vit B12 Inj Please advise

## 2021-03-19 ENCOUNTER — Encounter: Payer: Self-pay | Admitting: Family Medicine

## 2021-03-19 NOTE — Telephone Encounter (Signed)
LVM about scheduling lab appt, plus scheduling appt with Dr. [name] in June.

## 2021-03-20 ENCOUNTER — Other Ambulatory Visit: Payer: Self-pay

## 2021-03-20 ENCOUNTER — Other Ambulatory Visit (INDEPENDENT_AMBULATORY_CARE_PROVIDER_SITE_OTHER): Payer: Medicare HMO

## 2021-03-20 ENCOUNTER — Encounter: Payer: Self-pay | Admitting: Family Medicine

## 2021-03-20 DIAGNOSIS — E538 Deficiency of other specified B group vitamins: Secondary | ICD-10-CM | POA: Diagnosis not present

## 2021-03-20 LAB — VITAMIN B12: Vitamin B-12: >1506 pg/mL — ABNORMAL HIGH (ref 211–911)

## 2021-03-23 LAB — CBC AND DIFFERENTIAL
HCT: 34 — AB (ref 41–53)
Hemoglobin: 10.8 — AB (ref 13.5–17.5)
Neutrophils Absolute: 4.9
Platelets: 248 (ref 150–399)
WBC: 7.8

## 2021-03-23 LAB — BASIC METABOLIC PANEL
BUN: 25 — AB (ref 4–21)
CO2: 29 — AB (ref 13–22)
Chloride: 103 (ref 99–108)
Creatinine: 2 — AB (ref 0.6–1.3)
Glucose: 111
Potassium: 4.3 (ref 3.4–5.3)
Sodium: 141 (ref 137–147)

## 2021-03-23 LAB — COMPREHENSIVE METABOLIC PANEL
Albumin: 4 (ref 3.5–5.0)
Calcium: 9 (ref 8.7–10.7)
GFR calc non Af Amer: 36

## 2021-03-23 LAB — CBC: RBC: 3.63 — AB (ref 3.87–5.11)

## 2021-03-24 ENCOUNTER — Telehealth: Payer: Self-pay | Admitting: Family Medicine

## 2021-03-24 NOTE — Telephone Encounter (Signed)
Left message for patient to call back and schedule Medicare Annual Wellness Visit (AWV) in office.   If not able to come in office, please offer to do virtually or by telephone.   Last AWV: 03/06/20  Please schedule at anytime with -Nurse Health Advisor.  If any questions, please contact me at [phone]

## 2021-03-27 ENCOUNTER — Encounter: Payer: Self-pay | Admitting: Family Medicine

## 2021-04-16 ENCOUNTER — Telehealth: Payer: Self-pay | Admitting: Neurology

## 2021-04-16 NOTE — Telephone Encounter (Signed)
LVM for patient to call back to reschedule due to Dr. Leonie Man being out. Schedule with him or Janett Billow next available and wait list.

## 2021-04-22 ENCOUNTER — Encounter: Payer: Self-pay | Admitting: Family Medicine

## 2021-04-22 ENCOUNTER — Other Ambulatory Visit: Payer: Self-pay

## 2021-04-22 ENCOUNTER — Ambulatory Visit (INDEPENDENT_AMBULATORY_CARE_PROVIDER_SITE_OTHER): Payer: Medicare HMO | Admitting: Family Medicine

## 2021-04-22 VITALS — BP 126/78 | HR 66 | Temp 98.1°F | Resp 16 | Ht 67.0 in | Wt 145.4 lb

## 2021-04-22 DIAGNOSIS — I10 Essential (primary) hypertension: Secondary | ICD-10-CM | POA: Diagnosis not present

## 2021-04-22 DIAGNOSIS — E538 Deficiency of other specified B group vitamins: Secondary | ICD-10-CM

## 2021-04-22 DIAGNOSIS — N184 Chronic kidney disease, stage 4 (severe): Secondary | ICD-10-CM | POA: Diagnosis not present

## 2021-04-22 DIAGNOSIS — E559 Vitamin D deficiency, unspecified: Secondary | ICD-10-CM | POA: Diagnosis not present

## 2021-04-22 DIAGNOSIS — I745 Embolism and thrombosis of iliac artery: Secondary | ICD-10-CM

## 2021-04-22 DIAGNOSIS — J449 Chronic obstructive pulmonary disease, unspecified: Secondary | ICD-10-CM | POA: Diagnosis not present

## 2021-04-22 LAB — RENAL FUNCTION PANEL
Albumin: 4 g/dL (ref 3.5–5.2)
BUN: 27 mg/dL — ABNORMAL HIGH (ref 6–23)
CO2: 32 mEq/L (ref 19–32)
Calcium: 9.3 mg/dL (ref 8.4–10.5)
Chloride: 101 mEq/L (ref 96–112)
Creatinine, Ser: 2.11 mg/dL — ABNORMAL HIGH (ref 0.40–1.50)
GFR: 31.06 mL/min — ABNORMAL LOW (ref >60.00)
Glucose, Bld: 84 mg/dL (ref 70–99)
Phosphorus: 4.2 mg/dL (ref 2.3–4.6)
Potassium: 4.5 mEq/L (ref 3.5–5.1)
Sodium: 140 mEq/L (ref 135–145)

## 2021-04-22 LAB — CBC WITH DIFFERENTIAL/PLATELET
Basophils Absolute: 0.1 10*3/uL (ref 0.0–0.1)
Basophils Relative: 1.5 % (ref 0.0–3.0)
Eosinophils Absolute: 0.4 10*3/uL (ref 0.0–0.7)
Eosinophils Relative: 4.7 % (ref 0.0–5.0)
HCT: 35.5 % — ABNORMAL LOW (ref 39.0–52.0)
Hemoglobin: 11.5 g/dL — ABNORMAL LOW (ref 13.0–17.0)
Lymphocytes Relative: 14 % (ref 12.0–46.0)
Lymphs Abs: 1.1 10*3/uL (ref 0.7–4.0)
MCHC: 32.3 g/dL (ref 30.0–36.0)
MCV: 92.2 fl (ref 78.0–100.0)
Monocytes Absolute: 0.8 10*3/uL (ref 0.1–1.0)
Monocytes Relative: 10.5 % (ref 3.0–12.0)
Neutro Abs: 5.5 10*3/uL (ref 1.4–7.7)
Neutrophils Relative %: 69.3 % (ref 43.0–77.0)
Platelets: 250 10*3/uL (ref 150.0–400.0)
RBC: 3.85 Mil/uL — ABNORMAL LOW (ref 4.22–5.81)
RDW: 15 % (ref 11.5–15.5)
WBC: 7.9 10*3/uL (ref 4.0–10.5)

## 2021-04-22 LAB — VITAMIN B12: Vitamin B-12: 395 pg/mL (ref 211–911)

## 2021-04-22 LAB — HEPATIC FUNCTION PANEL
ALT: 25 U/L (ref 0–53)
AST: 24 U/L (ref 0–37)
Albumin: 4 g/dL (ref 3.5–5.2)
Alkaline Phosphatase: 97 U/L (ref 39–117)
Bilirubin, Direct: 0.1 mg/dL (ref 0.0–0.3)
Total Bilirubin: 0.6 mg/dL (ref 0.2–1.2)
Total Protein: 6.8 g/dL (ref 6.0–8.3)

## 2021-04-22 LAB — LIPID PANEL
Cholesterol: 121 mg/dL (ref 0–200)
HDL: 56.6 mg/dL (ref >39.00)
LDL Cholesterol: 45 mg/dL (ref 0–99)
NonHDL: 64.33
Total CHOL/HDL Ratio: 2
Triglycerides: 96 mg/dL (ref 0.0–149.0)
VLDL: 19.2 mg/dL (ref 0.0–40.0)

## 2021-04-22 LAB — VITAMIN D 25 HYDROXY (VIT D DEFICIENCY, FRACTURES): VITD: 47.41 ng/mL (ref 30.00–100.00)

## 2021-04-22 MED ORDER — DESONIDE 0.05 % EX CREA: TOPICAL_CREAM | Freq: Two times a day (BID) | CUTANEOUS | 5 refills | Status: DC

## 2021-04-22 MED ORDER — ALBUTEROL SULFATE (2.5 MG/3ML) 0.083% IN NEBU: 2.5000 mg | INHALATION_SOLUTION | Freq: Four times a day (QID) | RESPIRATORY_TRACT | 11 refills | Status: DC | PRN

## 2021-04-22 NOTE — Progress Notes (Signed)
Phone [phone] In person visit   Subjective:   Brian Graham is a 70 y.o. year old very pleasant male patient who presents for/with See problem oriented charting Chief Complaint  Patient presents with  . Chronic Kidney Disease  . Hypertension  . B12 deficiency    Recheck levels today. Takes 1,000 units daily   . Allergies    Sore throat from sinus drainage, causing left ear pain. Does take Zyrtec nightly with no improvement   . Hypothyroidism  . Health Maintenance    Due for DEXA     This visit occurred during the SARS-CoV-2 public health emergency.  Safety protocols were in place, including screening questions prior to the visit, additional usage of staff PPE, and extensive cleaning of exam room while observing appropriate contact time as indicated for disinfecting solutions.   Past Medical History-  Patient Active Problem List   Diagnosis Date Noted  . Chronic kidney disease (CKD), stage IV (severe) ([place]) 08/13/2019    Priority: High  . S/p nephrectomy 12/05/2018    Priority: High  . MGUS (monoclonal gammopathy of unknown significance) 08/02/2018    Priority: High  . Osteoporosis 06/26/2018    Priority: High  . Atherosclerosis of native arteries of extremity with intermittent claudication ([place]) 07/06/2017    Priority: High  . Weight loss 04/23/2017    Priority: High  . Former smoker 09/01/2016    Priority: High  . 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 Hx of CABG 08/05/2016    Priority: High  . CAD (coronary artery disease) of artery bypass graft 08/05/2016    Priority: High  . Vitamin D deficiency 01/20/2021    Priority: Medium  . Vitamin B12 deficiency 01/20/2021    Priority: Medium  . 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 History of adenomatous polyp of colon 04/22/2017    Priority: Medium  . Hyperlipidemia 04/04/2017    Priority: Medium  . Hypothyroidism (acquired) 04/04/2017    Priority: Medium  .  Essential hypertension 08/05/2016    Priority: Medium  . Essential tremor 06/11/2016    Priority: Medium  . Perennial allergic rhinitis 01/24/2018    Priority: Low  . Anxiety 07/24/2017    Priority: Low  . Pyelonephritis 04/23/2017    Priority: Low  . Aortic atherosclerosis ([place]) 04/13/2017    Priority: Low  . Seasonal allergies 04/04/2017    Priority: Low  . Myalgia 10/04/2016    Priority: Low  . Dysuria 08/23/2016    Priority: Low  . Chronic pain of both knees 08/23/2016    Priority: Low  . Tension headache 06/11/2016    Priority: Low  . Hearing abnormally acute, left 04/03/2020  . Healthcare maintenance 04/03/2020  . Constipation 09/03/2017    Medications- reviewed and updated Current Outpatient Medications  Medication Sig Dispense Refill  . acetaminophen (TYLENOL) 500 MG tablet Take 500-1,000 mg by mouth every 6 (six) hours as needed (for pain).     Marland Kitchen albuterol (VENTOLIN HFA) 108 (90 Base) MCG/ACT inhaler USE 2 INHALATIONS EVERY 4 HOURS AS NEEDED 25.5 g 2  . aspirin EC 81 MG tablet Take 81 mg by mouth daily.    Marland Kitchen atenolol (TENORMIN) 50 MG tablet TAKE 1 TABLET DAILY 90 tablet 3  . budesonide-formoterol (SYMBICORT) 160-4.5 MCG/ACT inhaler USE 2 INHALATIONS TWICE A DAY 30.6 g 3  . desonide (DESOWEN) 0.05 % cream Apply topically 2 (two) times daily. 7-10 days as  needed for eczema 30 g 5  . levothyroxine (SYNTHROID) 112 MCG tablet TAKE 1 TABLET DAILY BEFORE BREAKFAST 90 tablet 3  . mirtazapine (REMERON) 15 MG tablet Take 1 tablet (15 mg total) by mouth at bedtime. 90 tablet 1  . ondansetron (ZOFRAN-ODT) 4 MG disintegrating tablet PLACE 1 TO 2 TABLETS ON TONGUE AND ALLOW TO DISSOLVE EVERY 8 HOURS AS NEEDED FOR NAUSEA / VOMITING 60 tablet 1  . rosuvastatin (CRESTOR) 40 MG tablet TAKE 1 TABLET DAILY (NEED APPOINTMENT) 90 tablet 3  . SPIRIVA RESPIMAT 2.5 MCG/ACT AERS USE 2 INHALATIONS DAILY 12 g 1  . traMADol (ULTRAM) 50 MG tablet TAKE 1 TABLET (50 MG TOTAL) BY MOUTH 2 (TWO) TIMES  DAILY AS NEEDED FOR MODERATE PAIN OR SEVERE PAIN. 60 tablet 5  . albuterol (PROVENTIL) (2.5 MG/3ML) 0.083% nebulizer solution Take 3 mLs (2.5 mg total) by nebulization every 6 (six) hours as needed for wheezing or shortness of breath. J44.9 300 mL 11   No current facility-administered medications for this visit.     Objective:  BP 126/78   Pulse 66   Temp 98.1 F (36.7 C) (Temporal)   Resp 16   Ht 5\' 7"  (1.702 m)   Wt 145 lb 6.4 oz (66 kg)   SpO2 96%   BMI 22.77 kg/m  Gen: NAD, resting comfortably Neck: Mildly tender on left anterior neck  CV: RRR no murmurs rubs or gallops Lungs: slight diffuse wheeze otherwise clear to auscultation bilaterally Abdomen: soft/nontender/nondistended/normal bowel sounds. No rebound or guarding. Palpable supraumbilical hernia Ext: no edema Skin: warm, dry     Assessment and Plan    # CKD IV S: following with Dr. Justin Mend. likely going to go on peritoneal dialysis within 23m onths of 11/26/2019 per patient report after visit with Dr. Justin Mend.   On last check-his filtration rate went up 36-he received a personal call from Dr. Justin Mend about the improvement. After starting Miralax his constipation has decreased, he has also started the Remeron and appetite has been better-he is not clear what made the improvement  A/P: CKD 4 appears to have improved to CKD stage III- we will update title if improvement persists on labs  # Hypertension S: compliant with amlodipine 5 mg, atenolol 50mg . 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 #hyperlipidemia S: Medication:Rosuvastatin 40 mg, aspirin 81 mg Lab Results  Component Value Date   CHOL 115 08/13/2019   HDL 54.10 08/13/2019   LDLCALC 39 08/13/2019   LDLDIRECT 45.0 02/07/2019   TRIG 110.0  08/13/2019   CHOLHDL 2 08/13/2019   A/P: Lipids hopefully remains controlled-update lipids today For iliac artery occlusion-has done well after stent- continue risk factor modification with aspirin and statin and blood pressure control   #hypothyroidism S: compliant On thyroid medication-levothyroxine 112 mcg Lab Results  Component Value Date   TSH 0.507 01/13/2021  A/P: Well-controlled on recent check-do not need to repeat at this time   # COPD  S: Patient reports stable shortness of breath and cough Maintenance medications: 2.5 mg Spiriva. Patient has had to use albuterol 0-2 x per day. Oxygen available at home but has not had to use A/P: Overall feels pretty stable- continue current medication   # B12 deficiency S: Current treatment/medication (oral vs. IM): On oral repletion after excellent labs in April Lab  Results  Component Value Date   VITAMINB12 >1506 (H) 03/20/2021  A/P: Hopefully stable-update B12 again today to make sure maintain levels  #Weight loss- Dr. Carlean Purl thought could be related to depression or anxiety S: Patient has done well since starting Remeron-weight has stabilized since April. Eating two meals a day.  He feels so much better overall with eating better, fewer dry heaves, improvement in GFR-he states his motivation has significantly improved Wt Readings from Last 3 Encounters:  04/22/21 145 lb 6.4 oz (66 kg)  03/09/21 142 lb 4 oz (64.5 kg)  01/13/21 144 lb 3.2 oz (65.4 kg)   A/P: Thrilled with patient's recent progress-continue to monitor and continue Remeron  #Vitamin D deficiency S: Medication: No longer taking because he finished his prescribed dosage.   Last vitamin D Lab Results  Component Value Date   VD25OH 14.9 (L) 01/13/2021  A/P: Patient took high-dose vitamin D but states he is now off- update vitamin D with labs today  #Allergic rhinitis with intermittent postnasal drip producing mild throat Pain S: Intermittent left throat pain that  radiates to his left ear for 1 week but off and on over the last month. Describe the area feeling harder than usual but wife has not noted any changes. His Zyrtec was cut in 1/2 recently due to GFR. Started using Flonase nasal spray about a week ago, no relief yet.  Previously on Astelin but not at this time.  He prefers to go up on it Zyrtec if possible.. Signficant congestion partcularly in the morning, patient is concerned about post nasal drip causing his symptoms.   A/P: Mild poor control-intermittent postnasal drip causing some left throat irritation-exam shows no lymphadenopathy or abnormalities of the throat.  Suspect allergy related-I want to hold off on increasing Zyrtec- stay at 5 mg for now but possibly could increase given improvement in renal function.  Continue Flonase but if not improving start Astelin within 2 weeks-he will reach out  # Eczema  S: Skin often gets dry in his nasal area-actual seborrheic dermatitis.  Recommend the past uses desonide cream 0.005% to treat-mild relief. He is requesting for a refill.  A/P: With this being low potency-I think it is reasonable to use for seborrheic dermatitis for short periods  Recommended follow BJ:SEGBTD in about 6 months (around 10/22/2021). Future Appointments  Date Time Provider [place]  05/06/2021 11:10 AM Gatha Mayer, MD [facility]  10/28/2021  9:40 AM [name] Brayton Mars, MD [facility]   Lab/Order associations:   ICD-10-CM   1. Vitamin D deficiency  E55.9 VITAMIN D 25 Hydroxy (Vit-D Deficiency, Fractures)  2. Essential hypertension  I10 CBC with Differential/Platelet    Lipid panel    Renal Function Panel    Hepatic function panel  3. COPD, severe ([place]) Chronic J44.9   4. CKD (chronic kidney disease) stage 4, GFR 15-29 ml/min ([facility]) Chronic N18.4 Renal Function Panel  5. Vitamin B12 deficiency  E53.8 Vitamin B12  6. 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 albuterol (PROVENTIL) (2.5 MG/3ML) 0.083% nebulizer solution    Sig: Take 3 mLs (2.5 mg total) by nebulization every 6 (six) hours as needed for wheezing or shortness of breath. J44.9    Dispense:  300 mL    Refill:  11    J44.9  . desonide (DESOWEN) 0.05 % cream    Sig: Apply topically 2 (two) times daily. 7-10 days as needed  for eczema    Dispense:  30 g    Refill:  5   I,Alexis Bryant,acting as a scribe for Garret Reddish, MD.,have documented all relevant documentation on the behalf of Garret Reddish, MD,as directed by  Garret Reddish, MD while in the presence of Garret Reddish, MD.   I, Garret Reddish, MD, have reviewed all documentation for this visit. The documentation on 04/22/21 for the exam, diagnosis, procedures, and orders are all accurate and complete.   Return precautions advised.  Garret Reddish, MD

## 2021-04-22 NOTE — Patient Instructions (Addendum)
Call me if have not had improvement within another week or two with allergies- consider astelin for allergies  Please stop by lab before you go If you have mychart- we will send your results within 3 business days of [place] receiving them.  If you do not have mychart- we will call you about results within 5 business days of [place] receiving them.  *please also note that you will see labs on mychart as soon as they post. I will later go in and write notes on them- will say "notes from Dr. [name]"  No changes today unless labs lead [place] to make changes  Recommended follow up: Return in about 6 months (around 10/22/2021).

## 2021-04-23 ENCOUNTER — Ambulatory Visit: Payer: Medicare HMO | Admitting: Neurology

## 2021-04-28 ENCOUNTER — Encounter: Payer: Self-pay | Admitting: Family Medicine

## 2021-04-28 ENCOUNTER — Other Ambulatory Visit: Payer: Self-pay | Admitting: Family Medicine

## 2021-04-28 MED ORDER — TRAMADOL HCL 50 MG PO TABS: ORAL_TABLET | ORAL | 5 refills | Status: DC

## 2021-04-28 NOTE — Telephone Encounter (Signed)
Pt requesting refill on Tramadol. Last OV 04/22/2021.

## 2021-05-06 ENCOUNTER — Ambulatory Visit: Payer: Medicare HMO | Admitting: Internal Medicine

## 2021-05-06 ENCOUNTER — Encounter: Payer: Self-pay | Admitting: Internal Medicine

## 2021-05-06 ENCOUNTER — Other Ambulatory Visit (HOSPITAL_COMMUNITY): Payer: Self-pay

## 2021-05-06 VITALS — BP 140/80 | HR 60 | Ht 69.0 in | Wt 147.0 lb

## 2021-05-06 DIAGNOSIS — R630 Anorexia: Secondary | ICD-10-CM

## 2021-05-06 DIAGNOSIS — R634 Abnormal weight loss: Secondary | ICD-10-CM

## 2021-05-06 DIAGNOSIS — R1314 Dysphagia, pharyngoesophageal phase: Secondary | ICD-10-CM | POA: Diagnosis not present

## 2021-05-06 DIAGNOSIS — R1310 Dysphagia, unspecified: Secondary | ICD-10-CM

## 2021-05-06 DIAGNOSIS — R110 Nausea: Secondary | ICD-10-CM | POA: Diagnosis not present

## 2021-05-06 NOTE — Patient Instructions (Signed)
You have been scheduled for a modified barium swallow on 05/12/21 at 1:00pm Please arrive 15 minutes prior to your test for registration. You will go to [facility] Radiology (1st Floor) for your appointment. Should you need to cancel or reschedule your appointment, please contact [phone] [facility] [place]) or [phone] [place]). _____________________________________________________________________ A Modified Barium Swallow Study, or MBS, is a special x-ray that is taken to check swallowing skills. It is carried out by a Stage manager and a Psychologist, clinical (SLP). During this test, yourmouth, throat, and esophagus, a muscular tube which connects your mouth to your stomach, is checked. The test will help you, your doctor, and the SLP plan what types of foods and liquids are easier for you to swallow. The SLP will also identify positions and ways to help you swallow more easily and safely. What will happen during an MBS? You will be taken to an x-ray room and seated comfortably. You will be asked to swallow small amounts of food and liquid mixed with barium. Barium is a liquid or paste that allows images of your mouth, throat and esophagus to be seen on x-ray. The x-ray captures moving images of the food you are swallowing as it travels from your mouth through your throat and into your esophagus. This test helps identify whether food or liquid is entering your lungs (aspiration). The test also shows which part of your mouth or throat lacks strength or coordination to move the food or liquid in the right direction. This test typically takes 30 minutes to 1 hour to complete. _______________________________________________________________________   Follow up to be determined after testing.   I appreciate the opportunity to care for you. Silvano Rusk, MD, [facility]

## 2021-05-06 NOTE — Progress Notes (Signed)
Brian Graham 70 y.o. 1950-12-07 [id number]  Assessment & Plan:   Encounter Diagnoses  Name Primary?   Pharyngoesophageal dysphagia Yes   Chronic nausea    Anorexia    Loss of weight     Modified barium swallow for these dysphagia symptoms.  I wonder if he is having some difficulty with transfer type dysphagia.  EGD last year did not suggest any problems at the UES or within the esophagus that would cause dysphagia.  He has had a great response to mirtazapine as many people doing we will continue that.  Further plans and follow-up pending modified barium swallow.  I appreciate the opportunity to care for this patient. CC: Brian Olp, MD    Subjective:   Chief Complaint: Follow-up of nausea also complaining of dysphagia  HPI  Brian Graham is a 70 year old white man who has nausea and dyspepsia anorexia with a negative work-up including an EGD and CT scanning.  He was last seen in April, I did a serologic work-up for B12 deficiency which did not show autoimmune problems.  Mirtazapine 15 mg nightly was started and he said he is sleeping well his chronic constipation is even better he has an appetite and the nausea is gone.  Wt Readings from Last 3 Encounters:  05/06/21 147 lb (66.7 kg)  04/22/21 145 lb 6.4 oz (66 kg)  03/09/21 142 lb 4 oz (64.5 kg)      He is complaining of a first bite dysphagia situation where he feels like the food will not pass from the pharynx into the esophagus and he coughs a lot.  It does not sound like impact dysphagia in the esophagus when I talk to him though I am not sure.  EGD in January 2021 did not show any problems that would correlate with this.  He has not had a barium swallow before.  No history of aspiration pneumonia.  Allergies  Allergen Reactions   Contrast Media [Iodinated Diagnostic Agents] Other (See Comments)    NOT ALLOWED DUE TO KIDNEY ISSUES.   Other Itching and Rash    Gel used for ultrasound SEVERELY broke out the  skin    Keflex [Cephalexin] Diarrhea, Nausea Only and Other (See Comments)    Insomnia   Sulfa Antibiotics Other (See Comments)    Insomnia   Ciprofloxacin Diarrhea   Levaquin [Levofloxacin] Other (See Comments)    Insomnia    Omeprazole Diarrhea   Current Meds  Medication Sig   acetaminophen (TYLENOL) 500 MG tablet Take 500-1,000 mg by mouth every 6 (six) hours as needed (for pain).    albuterol (PROVENTIL) (2.5 MG/3ML) 0.083% nebulizer solution Take 3 mLs (2.5 mg total) by nebulization every 6 (six) hours as needed for wheezing or shortness of breath. J44.9   albuterol (VENTOLIN HFA) 108 (90 Base) MCG/ACT inhaler USE 2 INHALATIONS EVERY 4 HOURS AS NEEDED   aspirin EC 81 MG tablet Take 81 mg by mouth daily.   atenolol (TENORMIN) 50 MG tablet TAKE 1 TABLET DAILY   budesonide-formoterol (SYMBICORT) 160-4.5 MCG/ACT inhaler USE 2 INHALATIONS TWICE A DAY   desonide (DESOWEN) 0.05 % cream Apply topically 2 (two) times daily. 7-10 days as needed for eczema   levothyroxine (SYNTHROID) 112 MCG tablet TAKE 1 TABLET DAILY BEFORE BREAKFAST   mirtazapine (REMERON) 15 MG tablet Take 1 tablet (15 mg total) by mouth at bedtime.   ondansetron (ZOFRAN-ODT) 4 MG disintegrating tablet PLACE 1 TO 2 TABLETS ON TONGUE AND ALLOW TO DISSOLVE EVERY 8  HOURS AS NEEDED FOR NAUSEA / VOMITING   rosuvastatin (CRESTOR) 40 MG tablet TAKE 1 TABLET DAILY (NEED APPOINTMENT)   SPIRIVA RESPIMAT 2.5 MCG/ACT AERS USE 2 INHALATIONS DAILY   traMADol (ULTRAM) 50 MG tablet TAKE 1 TABLET (50 MG TOTAL) BY MOUTH 2 (TWO) TIMES DAILY AS NEEDED FOR MODERATE PAIN OR SEVERE PAIN.   Past Medical History:  Diagnosis Date   Anxiety    Arthritis    "left knee" (08/05/2016)   Bruises easily    CAD (coronary artery disease) of artery bypass graft 08/05/2016   Around age 69. CABG - 3 vessel.    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 Carles Collet   Former smoker 09/01/2016   Quit 2012. 40 pack years at least.    GERD (gastroesophageal reflux disease)    Headache    History of blood transfusion 08/1997   "when he had his heart surgery"   History of shingles 1970-2013 X 3   HTN (hypertension) 08/05/2016   Amlodipine 5mg , atenolol 50mg  BID, benazepril 20mg    Hyperlipemia    Hypothyroidism    Lumbar disc disease    MGUS (monoclonal gammopathy of unknown significance)    Myocardial infarction ([place]) 08/1997   Peripheral vascular disease (HCC)    PONV (postoperative nausea and vomiting)    Urinary tract infection 09/03/2017   Wears glasses    Past Surgical History:  Procedure Laterality Date   AORTOGRAM Right 08/31/2017   Procedure: AORTOGRAM BILATERAL PELVIC ANGIOGRAM WITH LEFT ILIAC ARTERY STENT;  Surgeon: Brian Burchinal, MD;  Location: [facility];  Service: Vascular;  Laterality: Right;   BACK SURGERY     CARDIAC CATHETERIZATION  08/1997   COLONOSCOPY  2014   CORONARY ARTERY BYPASS GRAFT  09/12/1997   "triple"   ESOPHAGOGASTRODUODENOSCOPY     INGUINAL HERNIA REPAIR Right [place] Left 1960s - 1974 X 4   LAPAROSCOPIC ABLATION RENAL MASS  ~ 2014   "large abscess/pus ball"   [place]   "they were wedged in the bowel"   [place] Left ~ 1965   "removed knee cap"   ROBOT ASSISTED LAPAROSCOPIC NEPHRECTOMY Left 10/07/2017   Procedure: XI ROBOTIC ASSISTED LAPAROSCOPIC NEPHRECTOMY OF PELVIC KIDNEY;  Surgeon: Brian Frock, MD;  Location: [facility];  Service: Urology;  Laterality: Left;  Place robot patient tower at foot of bed like prostate per Dr.  Tresa Graham. Pull extra staple loads.   TONSILLECTOMY     Social History   Social History Narrative   Lives with wife. 2 adopted daughters from [place].       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   Review of Systems As per HPI  Objective:   Physical Exam BP 140/80   Pulse 60   Ht 5\' 9"  (1.753 m)   Wt 147 lb (66.7 kg)   BMI 21.71 kg/m

## 2021-05-12 ENCOUNTER — Other Ambulatory Visit: Payer: Self-pay

## 2021-05-12 ENCOUNTER — Encounter (HOSPITAL_COMMUNITY): Payer: Self-pay

## 2021-05-12 ENCOUNTER — Ambulatory Visit (HOSPITAL_COMMUNITY)
Admission: RE | Admit: 2021-05-12 | Discharge: 2021-05-12 | Disposition: A | Discharge status: Home / Self Care | Payer: Medicare HMO | Source: Ambulatory Visit | Attending: Internal Medicine | Admitting: Internal Medicine

## 2021-05-12 DIAGNOSIS — R1314 Dysphagia, pharyngoesophageal phase: Secondary | ICD-10-CM | POA: Insufficient documentation

## 2021-05-12 DIAGNOSIS — R1310 Dysphagia, unspecified: Secondary | ICD-10-CM | POA: Insufficient documentation

## 2021-07-06 ENCOUNTER — Encounter: Payer: Self-pay | Admitting: Family Medicine

## 2021-07-06 NOTE — Telephone Encounter (Signed)
Patient called in regard to message.    States he is currently in [place].    [place] stopped telehealth care on 04/27/20 and have not reinstated this.  Patient states he tested positive for COVID today 8/15.  States symptoms started 2 days ago 8/13 with cough and headache.  I have advised patient to seek treatment at a UC.  Patient states he will locate one there.  Patient can be reached for follow up at [phone].

## 2021-07-06 NOTE — Telephone Encounter (Signed)
FYI

## 2021-07-07 ENCOUNTER — Encounter: Payer: Self-pay | Admitting: Family Medicine

## 2021-07-07 ENCOUNTER — Telehealth (INDEPENDENT_AMBULATORY_CARE_PROVIDER_SITE_OTHER): Payer: Medicare HMO | Admitting: Family Medicine

## 2021-07-07 VITALS — BP 128/70

## 2021-07-07 DIAGNOSIS — N184 Chronic kidney disease, stage 4 (severe): Secondary | ICD-10-CM

## 2021-07-07 DIAGNOSIS — I10 Essential (primary) hypertension: Secondary | ICD-10-CM | POA: Diagnosis not present

## 2021-07-07 DIAGNOSIS — U071 COVID-19: Secondary | ICD-10-CM | POA: Diagnosis not present

## 2021-07-07 MED ORDER — MOLNUPIRAVIR EUA 200MG CAPSULE: 4.0000 | ORAL_CAPSULE | Freq: Two times a day (BID) | ORAL | 0 refills | Status: AC

## 2021-07-07 NOTE — Patient Instructions (Addendum)
Health Maintenance Due  Topic Date Due   DEXA SCAN Discuss at next in office visit.  06/21/2020   INFLUENZA VACCINE Patient will get this at his local pharmacy at his earliest convenience.  06/22/2021    Recommended follow up: No follow-ups on file.

## 2021-07-07 NOTE — Progress Notes (Signed)
Phone [phone] Virtual visit via Video note   Subjective:  Chief complaint: Chief Complaint  Patient presents with   Covid Positive    Sunday night was the first day of symptoms, tested positive Monday Morning. Patient states that he is fatigues, coughing and has a very bad headache.    This visit type was conducted due to national recommendations for restrictions regarding the COVID-19 Pandemic (e.g. social distancing).  This format is felt to be most appropriate for this patient at this time balancing risks to patient and risks to population by having him in for in person visit.  No physical exam was performed (except for noted visual exam or audio findings with Telehealth visits).    Our team/I connected with Brian Graham at  4:40 PM EDT by a video enabled telemedicine application (doxy.me or caregility through epic) and verified that I am speaking with the correct person using two identifiers.  Location patient: [place] Location provider: [facility], office Persons participating in the virtual visit:  patient  Our team/I discussed the limitations of evaluation and management by telemedicine and the availability of in person appointments. In light of current covid-19 pandemic, patient also understands that we are trying to protect them by minimizing in office contact if at all possible.  The patient expressed consent for telemedicine visit and agreed to proceed. Patient understands insurance will be billed.   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    Priority: High   Hx of CABG 08/05/2016    Priority: High   CAD (coronary artery disease) of artery bypass graft 08/05/2016    Priority: High   Vitamin D deficiency 01/20/2021    Priority: Medium   Vitamin B12 deficiency 01/20/2021    Priority: Medium   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    Priority: Medium   History of adenomatous polyp of colon 04/22/2017    Priority: Medium   Hyperlipidemia 04/04/2017    Priority: Medium   Hypothyroidism (acquired) 04/04/2017    Priority: Medium   Essential hypertension 08/05/2016    Priority: Medium   Essential tremor 06/11/2016    Priority: Medium   Perennial allergic rhinitis 01/24/2018    Priority: Low   Anxiety 07/24/2017    Priority: Low   Pyelonephritis 04/23/2017    Priority: Low   Aortic atherosclerosis ([place]) 04/13/2017    Priority: Low   Seasonal allergies 04/04/2017    Priority: Low   Myalgia 10/04/2016    Priority: Low   Dysuria 08/23/2016    Priority: Low   Chronic pain of both knees 08/23/2016    Priority: Low   Tension headache 06/11/2016    Priority: Low   Hearing abnormally acute, left 04/03/2020   Healthcare maintenance 04/03/2020   Constipation 09/03/2017    Medications- reviewed and updated Current Outpatient Medications  Medication Sig Dispense Refill   acetaminophen (TYLENOL) 500 MG tablet Take 500-1,000 mg by mouth  every 6 (six) hours as needed (for pain).      albuterol (PROVENTIL) (2.5 MG/3ML) 0.083% nebulizer solution Take 3 mLs (2.5 mg total) by nebulization every 6 (six) hours as needed for wheezing or shortness of breath. J44.9 300 mL 11   albuterol (VENTOLIN HFA) 108 (90 Base) MCG/ACT inhaler USE 2 INHALATIONS EVERY 4 HOURS AS NEEDED 25.5 g 2   aspirin EC 81 MG tablet Take 81 mg by mouth daily.     atenolol (TENORMIN) 50 MG tablet TAKE 1 TABLET DAILY 90 tablet 3    budesonide-formoterol (SYMBICORT) 160-4.5 MCG/ACT inhaler USE 2 INHALATIONS TWICE A DAY 30.6 g 3   desonide (DESOWEN) 0.05 % cream Apply topically 2 (two) times daily. 7-10 days as needed for eczema 30 g 5   levothyroxine (SYNTHROID) 112 MCG tablet TAKE 1 TABLET DAILY BEFORE BREAKFAST 90 tablet 3   mirtazapine (REMERON) 15 MG tablet Take 1 tablet (15 mg total) by mouth at bedtime. 90 tablet 1   molnupiravir EUA 200 mg CAPS Take 4 capsules (800 mg total) by mouth 2 (two) times daily for 5 days. 40 capsule 0   SPIRIVA RESPIMAT 2.5 MCG/ACT AERS USE 2 INHALATIONS DAILY 12 g 1   traMADol (ULTRAM) 50 MG tablet TAKE 1 TABLET (50 MG TOTAL) BY MOUTH 2 (TWO) TIMES DAILY AS NEEDED FOR MODERATE PAIN OR SEVERE PAIN. 60 tablet 5   rosuvastatin (CRESTOR) 40 MG tablet TAKE 1 TABLET DAILY (NEED APPOINTMENT) 90 tablet 3   No current facility-administered medications for this visit.     Objective:  BP 128/70   SpO2 97%  self reported vitals Gen: NAD, resting comfortably Lungs: nonlabored, normal respiratory rate  Skin: appears dry, no obvious rash     Assessment and Plan  # [id number] positive S:Patient started with symptoms on Sunday evening. He just got back from [employer] and patient reports had notification was exposed 11th or 12th of august. Tested postive for covid on Monday morning. Started coughing on Sunday, woke up on Monday feeling like he was hit by a truck- fatigue, coughing, bad headache. Was in [place] until today when drove back (took granddaughter for trip)    No shortness of breath or fevers. Mild sore throat/tingling and losing voice. Mild wheezing.  A/P: Patient with testing confirming covid 19 with first day of covid 19 symptoms  august 14th.  Vaccination status:had covid vaccine #1 and #2 but lost hearing and as a result has not had #3   Therefore: - recommended patient watch closely for shortness of breath or confusion or worsening symptoms and if those occur patient should  contact us immediately or seek care in the emergency department -recommended patient consider purchasing pulse oximeter and if levels 94% or below persistently- seek care at the hospital - Patient needs to self isolate  for at least 5 days since first symptom AND at least 24 hours fever free without fever reducing medications AND have improvement in respiratory symptoms . After 5 days can end self isolation but still needs to wear mask for additional 5 days  -Patient should inform close contacts about exposure (anyone patient been around unmasked for more than 15 minutes) -only wife and granddaughter that he is aware of  If High risk for complications-we discussed outpatient therapeutic options including paxlovid (not an option due to CKD and GFR times under 30), molnupiravir, MAB infusion-  - patient opted for molnupiravir-we did discuss side effects and risk of rebound and repeat self-isolation if this  occurs  # CKD IV S: following with Dr. Justin Mend.  Originally it looked like he was going to go on peritoneal dialysis within 6 months of 11/26/2019 visit with Dr. Murvin Natal per patient report-but later filtration rate improved to right around 30 after being under 20 A/P: CKD stage IV has been stable-in fact may be moving into CKD stage III range but I do not feel confident enough to prescribe paxlovid with GFR being right around 30  # Hypertension S: compliant with atenolol 50mg  once daily -Off benazepril 20mg  due to renal function -Patient also off amlodipine-unclear per chart when this was stopped Home reading #s:128/70  BP Readings from Last 3 Encounters:  07/07/21 128/70  05/06/21 140/80  04/22/21 126/78  A/P: Blood pressure is well controlled on most recent home checks despite only being on atenolol-continue current medications remain off amlodipine for now  Recommended follow up: As needed for new or worsening symptoms or if symptoms fail to improve Future Appointments  Date Time Provider  [place]  10/28/2021  9:40 AM [name], MD [facility]   Lab/Order associations:   ICD-10-CM   1. COVID-19  U07.1     2. Essential hypertension  I10     3. Chronic kidney disease (CKD), stage IV (severe) (HCC)  N18.4       Meds ordered this encounter  Medications   molnupiravir EUA 200 mg CAPS    Sig: Take 4 capsules (800 mg total) by mouth 2 (two) times daily for 5 days.    Dispense:  40 capsule    Refill:  0    Return precautions advised.  Brian Reddish, MD

## 2021-07-07 NOTE — Assessment & Plan Note (Signed)
S: compliant with atenolol 50mg  once daily.  -Off benazepril 20mg  due to renal function -Patient also off amlodipine-unclear per chart when this was stopped Home reading #s:128/70  BP Readings from Last 3 Encounters:  07/07/21 128/70  05/06/21 140/80  04/22/21 126/78  A/P: Blood pressure is well controlled on most recent home checks despite only being on atenolol-continue current medications remain off amlodipine for now

## 2021-07-07 NOTE — Telephone Encounter (Signed)
See below

## 2021-07-07 NOTE — Telephone Encounter (Signed)
Patient is calling in stating he would like a virtual appointment, advised that I didn't have anything with Dr.Hunter to which patient became aggravated telling me that Dr.Hunter told him to schedule a visit with just him. Can we work patient in for a virtual with Dr.Hunter?  Patient refuses to see anyone else.

## 2021-07-08 ENCOUNTER — Encounter: Payer: Medicare HMO | Admitting: Physician Assistant

## 2021-07-08 NOTE — Progress Notes (Signed)
Needed appt for wife, not him. Closed in error

## 2021-07-14 ENCOUNTER — Telehealth: Payer: Self-pay

## 2021-07-14 ENCOUNTER — Encounter (HOSPITAL_BASED_OUTPATIENT_CLINIC_OR_DEPARTMENT_OTHER): Payer: Self-pay | Admitting: Emergency Medicine

## 2021-07-14 ENCOUNTER — Other Ambulatory Visit: Payer: Self-pay

## 2021-07-14 ENCOUNTER — Emergency Department (HOSPITAL_BASED_OUTPATIENT_CLINIC_OR_DEPARTMENT_OTHER): Payer: Medicare HMO

## 2021-07-14 ENCOUNTER — Emergency Department (HOSPITAL_BASED_OUTPATIENT_CLINIC_OR_DEPARTMENT_OTHER)
Admission: EM | Admit: 2021-07-14 | Discharge: 2021-07-14 | Disposition: A | Discharge status: Home / Self Care | Payer: Medicare HMO | Attending: Emergency Medicine | Admitting: Emergency Medicine

## 2021-07-14 DIAGNOSIS — E039 Hypothyroidism, unspecified: Secondary | ICD-10-CM | POA: Insufficient documentation

## 2021-07-14 DIAGNOSIS — Z7982 Long term (current) use of aspirin: Secondary | ICD-10-CM | POA: Insufficient documentation

## 2021-07-14 DIAGNOSIS — Z79899 Other long term (current) drug therapy: Secondary | ICD-10-CM | POA: Insufficient documentation

## 2021-07-14 DIAGNOSIS — R0902 Hypoxemia: Secondary | ICD-10-CM | POA: Diagnosis not present

## 2021-07-14 DIAGNOSIS — J449 Chronic obstructive pulmonary disease, unspecified: Secondary | ICD-10-CM | POA: Diagnosis not present

## 2021-07-14 DIAGNOSIS — I129 Hypertensive chronic kidney disease with stage 1 through stage 4 chronic kidney disease, or unspecified chronic kidney disease: Secondary | ICD-10-CM | POA: Insufficient documentation

## 2021-07-14 DIAGNOSIS — U071 COVID-19: Secondary | ICD-10-CM | POA: Diagnosis not present

## 2021-07-14 DIAGNOSIS — Z951 Presence of aortocoronary bypass graft: Secondary | ICD-10-CM | POA: Diagnosis not present

## 2021-07-14 DIAGNOSIS — N184 Chronic kidney disease, stage 4 (severe): Secondary | ICD-10-CM | POA: Diagnosis not present

## 2021-07-14 DIAGNOSIS — N179 Acute kidney failure, unspecified: Secondary | ICD-10-CM

## 2021-07-14 DIAGNOSIS — I2510 Atherosclerotic heart disease of native coronary artery without angina pectoris: Secondary | ICD-10-CM | POA: Insufficient documentation

## 2021-07-14 DIAGNOSIS — Z87891 Personal history of nicotine dependence: Secondary | ICD-10-CM | POA: Insufficient documentation

## 2021-07-14 DIAGNOSIS — R059 Cough, unspecified: Secondary | ICD-10-CM | POA: Diagnosis present

## 2021-07-14 LAB — BASIC METABOLIC PANEL
Anion gap: 13 (ref 5–15)
BUN: 43 mg/dL — ABNORMAL HIGH (ref 8–23)
CO2: 26 mmol/L (ref 22–32)
Calcium: 8.9 mg/dL (ref 8.9–10.3)
Chloride: 95 mmol/L — ABNORMAL LOW (ref 98–111)
Creatinine, Ser: 2.93 mg/dL — ABNORMAL HIGH (ref 0.61–1.24)
GFR, Estimated: 22 mL/min — ABNORMAL LOW (ref >60)
Glucose, Bld: 104 mg/dL — ABNORMAL HIGH (ref 70–99)
Potassium: 3.4 mmol/L — ABNORMAL LOW (ref 3.5–5.1)
Sodium: 134 mmol/L — ABNORMAL LOW (ref 135–145)

## 2021-07-14 LAB — CBC WITH DIFFERENTIAL/PLATELET
Abs Immature Granulocytes: 0.04 10*3/uL (ref 0.00–0.07)
Basophils Absolute: 0 10*3/uL (ref 0.0–0.1)
Basophils Relative: 0 %
Eosinophils Absolute: 0 10*3/uL (ref 0.0–0.5)
Eosinophils Relative: 0 %
HCT: 38.9 % — ABNORMAL LOW (ref 39.0–52.0)
Hemoglobin: 12.5 g/dL — ABNORMAL LOW (ref 13.0–17.0)
Immature Granulocytes: 1 %
Lymphocytes Relative: 10 %
Lymphs Abs: 0.8 10*3/uL (ref 0.7–4.0)
MCH: 28.8 pg (ref 26.0–34.0)
MCHC: 32.1 g/dL (ref 30.0–36.0)
MCV: 89.6 fL (ref 80.0–100.0)
Monocytes Absolute: 1.3 10*3/uL — ABNORMAL HIGH (ref 0.1–1.0)
Monocytes Relative: 16 %
Neutro Abs: 5.9 10*3/uL (ref 1.7–7.7)
Neutrophils Relative %: 73 %
Platelets: 349 10*3/uL (ref 150–400)
RBC: 4.34 MIL/uL (ref 4.22–5.81)
RDW: 15.4 % (ref 11.5–15.5)
WBC: 8 10*3/uL (ref 4.0–10.5)
nRBC: 0 % (ref 0.0–0.2)

## 2021-07-14 MED ORDER — POTASSIUM CHLORIDE CRYS ER 20 MEQ PO TBCR
40.0000 meq | EXTENDED_RELEASE_TABLET | Freq: Once | ORAL | Status: AC
  Administered 2021-07-14: 40 meq via ORAL
  Filled 2021-07-14: qty 2

## 2021-07-14 MED ORDER — SODIUM CHLORIDE 0.9 % IV BOLUS
1000.0000 mL | Freq: Once | INTRAVENOUS | Status: AC
  Administered 2021-07-14: 1000 mL via INTRAVENOUS

## 2021-07-14 MED ORDER — AZITHROMYCIN 250 MG PO TABS: ORAL_TABLET | ORAL | 0 refills | Status: DC

## 2021-07-14 MED ORDER — SODIUM CHLORIDE 0.9 % IV BOLUS
500.0000 mL | Freq: Once | INTRAVENOUS | Status: AC
  Administered 2021-07-14: 500 mL via INTRAVENOUS

## 2021-07-14 NOTE — Telephone Encounter (Signed)
Spoke with patient and his wife. Advised that I cannot schedule a virtual visit, that he needs to go to ER ASAP. Patient was hesitant. Informed him of the new [facility] ER, less wait time - patient was more willing to go there to be seen.

## 2021-07-14 NOTE — ED Provider Notes (Signed)
Brian Graham is a 70 y.o. male.  Patient presents with chief complaint of generalized fatigue persistent cough, diarrhea.  He was diagnosed with COVID with now about 11 days of symptoms.  He was complaining of decreased appetite and significant weakness in his doctor advised him to go to the ER to check his creatinine levels.  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  Last creatinine was around 2 at baseline he states.  In the past has been as bad as creatinine of 7.  Denies headache or chest pain or abdominal pain.  Denies any vomiting.  Has about 2-3 loose bowel movements a day in the last 4 days now.      Past Medical History:  Diagnosis Date   Anxiety    Arthritis    "left knee" (08/05/2016)   Bruises easily    CAD (coronary artery disease) of artery bypass graft 08/05/2016   Around age 11. CABG - 3 vessel.    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 Carles Collet   Former smoker 09/01/2016   Quit 2012. 40 pack years at least.    GERD (gastroesophageal reflux disease)    Headache    History of blood transfusion 08/1997   "when he had his heart surgery"   History of shingles 1970-2013 X 3   HTN (hypertension) 08/05/2016   Amlodipine 5mg , atenolol 50mg  BID, benazepril 20mg    Hyperlipemia    Hypothyroidism    Lumbar disc disease    MGUS (monoclonal gammopathy of unknown significance)    Myocardial  infarction ([place]) 08/1997   Peripheral vascular disease ([place])    PONV (postoperative nausea and vomiting)    Urinary tract infection 09/03/2017   Wears glasses     Patient Active Problem List   Diagnosis Date Noted   Vitamin D deficiency 01/20/2021   Vitamin B12 deficiency 01/20/2021   Hearing abnormally acute, left 04/03/2020   Healthcare maintenance 04/03/2020   Chronic kidney disease (CKD), stage IV (severe) ([place]) 08/13/2019   S/p nephrectomy 12/05/2018   MGUS (monoclonal gammopathy of unknown significance) 08/02/2018   Osteoporosis 06/26/2018   Perennial allergic rhinitis 01/24/2018   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   Constipation 09/03/2017   Anxiety 07/24/2017   Atherosclerosis of native arteries of extremity with intermittent claudication ([place]) 07/06/2017   Pyelonephritis 04/23/2017   Weight loss 04/23/2017   History of adenomatous polyp of colon 04/22/2017   Aortic atherosclerosis ([place]) 04/13/2017   Hyperlipidemia 04/04/2017   Hypothyroidism (acquired) 04/04/2017   Seasonal allergies 04/04/2017   Myalgia 10/04/2016   Former smoker 09/01/2016   COPD, 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   Dysuria 08/23/2016   Chronic pain of both knees 08/23/2016   Hx of CABG 08/05/2016   CAD (coronary artery disease) of artery bypass graft 08/05/2016   Essential hypertension 08/05/2016   Essential tremor 06/11/2016  Tension headache 06/11/2016    Past Surgical History:  Procedure Laterality Date   AORTOGRAM Right 08/31/2017   Procedure: AORTOGRAM BILATERAL PELVIC ANGIOGRAM WITH LEFT ILIAC ARTERY STENT;  Surgeon: Conrad Belgium, MD;  Location: [facility];  Service: Vascular;  Laterality: Right;   BACK SURGERY     CARDIAC CATHETERIZATION  08/1997   COLONOSCOPY  2014   CORONARY ARTERY BYPASS GRAFT  09/12/1997   "triple"   ESOPHAGOGASTRODUODENOSCOPY     INGUINAL HERNIA REPAIR Right [place] Left 1960s - 1974 X 4   LAPAROSCOPIC ABLATION RENAL MASS  ~  2014   "large abscess/pus ball"   [place]   "they were wedged in the bowel"   [place] Left ~ 1965   "removed knee cap"   ROBOT ASSISTED LAPAROSCOPIC NEPHRECTOMY Left 10/07/2017   Procedure: XI ROBOTIC ASSISTED LAPAROSCOPIC NEPHRECTOMY OF PELVIC KIDNEY;  Surgeon: Alexis Frock, MD;  Location: [facility];  Service: Urology;  Laterality: Left;  Place robot patient tower at foot of bed like prostate per Dr. Tresa Moore. Pull extra staple loads.   TONSILLECTOMY         Family History  Problem Relation Age of Onset   Alcohol abuse Mother    Alcohol abuse Father        not involved in life   Alcoholism Sister    Healthy Sister    Healthy Sister    Allergic rhinitis Neg Hx    Angioedema Neg Hx    Asthma Neg Hx    Eczema Neg Hx    Immunodeficiency Neg Hx    Urticaria Neg Hx     Social History   Tobacco Use   Smoking status: Former    Packs/day: 1.00    Years: 45.00    Pack years: 45.00    Types: Cigarettes    Quit date: 07/10/2017    Years since quitting: 4.0   Smokeless tobacco: Never  Vaping Use   Vaping Use: Never used  Substance Use Topics   Alcohol use: Yes    Alcohol/week: 14.0 standard drinks    Types: 14 Shots of liquor per week    Comment: daily 2 shots of vodka   Drug use: No    Home Medications Prior to Admission medications   Medication Sig Start Date End Date Taking? Authorizing Provider  azithromycin (ZITHROMAX Z-PAK) 250 MG tablet Take 2 tablets by mouth on day 1.  Then take 1 tablet by mouth on days 2 through 5. 07/14/21  Yes Maedell Hedger, Greggory Brandy, MD  acetaminophen (TYLENOL) 500 MG tablet Take 500-1,000 mg by mouth every 6 (six) hours as needed (for pain).     [provider]  albuterol (PROVENTIL) (2.5 MG/3ML) 0.083% nebulizer solution Take 3 mLs (2.5 mg total) by nebulization every 6 (six) hours as needed for wheezing or shortness of breath. J44.9 04/22/21   [name], MD   albuterol (VENTOLIN HFA) 108 (90 Base) MCG/ACT inhaler USE 2 INHALATIONS EVERY 4 HOURS AS NEEDED 02/17/21   [name], MD  aspirin EC 81 MG tablet Take 81 mg by mouth daily.    [provider]  atenolol (TENORMIN) 50 MG tablet TAKE 1 TABLET DAILY 01/29/21   [name], MD  budesonide-formoterol [facility]) 160-4.5 MCG/ACT inhaler USE 2 INHALATIONS TWICE A DAY 11/20/20   Lauraine Rinne, NP  desonide (DESOWEN) 0.05 % cream  Apply topically 2 (two) times daily. 7-10 days as needed for eczema 04/22/21   [name], MD  levothyroxine (SYNTHROID) 112 MCG tablet TAKE 1 TABLET DAILY BEFORE BREAKFAST 02/23/21   [name], MD  mirtazapine (REMERON) 15 MG tablet Take 1 tablet (15 mg total) by mouth at bedtime. 03/09/21   Gatha Mayer, MD  rosuvastatin (CRESTOR) 40 MG tablet TAKE 1 TABLET DAILY (NEED APPOINTMENT) 09/08/20   [name], MD  SPIRIVA RESPIMAT 2.5 MCG/ACT AERS USE 2 INHALATIONS DAILY 02/23/21   Lauraine Rinne, NP  traMADol (ULTRAM) 50 MG tablet TAKE 1 TABLET (50 MG TOTAL) BY MOUTH 2 (TWO) TIMES DAILY AS NEEDED FOR MODERATE PAIN OR SEVERE PAIN. 04/28/21   [name], MD    Allergies    Contrast media [iodinated diagnostic agents], Other, Keflex [cephalexin], Sulfa antibiotics, Ciprofloxacin, Levaquin [levofloxacin], and Omeprazole  Review of Systems   Review of Systems  Constitutional:  Negative for fever.  HENT:  Negative for ear pain and sore throat.   Eyes:  Negative for pain.  Respiratory:  Positive for cough.   Cardiovascular:  Negative for chest pain.  Gastrointestinal:  Negative for abdominal pain.  Genitourinary:  Negative for flank pain.  Musculoskeletal:  Negative for back pain.  Skin:  Negative for color change and rash.  Neurological:  Negative for syncope.  All other systems reviewed and are negative.  Physical Exam Updated Vital Signs BP 140/73   Pulse 88   Temp 98.9 F (37.2 C) (Oral)   Resp 20   Ht 5\' 9"  (1.753 m)   Wt  68 kg   SpO2 90%   BMI 22.15 kg/m   Physical Exam Constitutional:      Appearance: He is well-developed.  HENT:     Head: Normocephalic.     Nose: Nose normal.  Eyes:     Extraocular Movements: Extraocular movements intact.  Cardiovascular:     Rate and Rhythm: Normal rate.  Pulmonary:     Effort: Pulmonary effort is normal.  Abdominal:     Tenderness: There is no abdominal tenderness. There is no guarding or rebound.  Skin:    Coloration: Skin is not jaundiced.  Neurological:     Mental Status: He is alert. Mental status is at baseline.    ED Results / Procedures / Treatments   Labs (all labs ordered are listed, but only abnormal results are displayed) Labs Reviewed  CBC WITH DIFFERENTIAL/PLATELET - Abnormal; Notable for the following components:      Result Value   Hemoglobin 12.5 (*)    HCT 38.9 (*)    Monocytes Absolute 1.3 (*)    All other components within normal limits  BASIC METABOLIC PANEL - Abnormal; Notable for the following components:   Sodium 134 (*)    Potassium 3.4 (*)    Chloride 95 (*)    Glucose, Bld 104 (*)    BUN 43 (*)    Creatinine, Ser 2.93 (*)    GFR, Estimated 22 (*)    All other components within normal limits    EKG EKG Interpretation  Date/Time:  Tuesday July 14 2021 15:28:06 EDT Ventricular Rate:  97 PR Interval:  165 QRS Duration: 113 QT Interval:  363 QTC Calculation: 462 R Axis:   -71 Text Interpretation: Sinus rhythm Left anterior fascicular block Anteroseptal infarct, old ST elevation, consider inferior injury Confirmed by Thamas Jaegers ([id number]) on 07/14/2021 3:51:51 PM  Radiology DG Chest [facility] 1 View  Result  Date: 07/14/2021 CLINICAL DATA:  Dyspnea, productive cough EXAM: PORTABLE CHEST 1 VIEW COMPARISON:  None. FINDINGS: Lungs are mildly hyperinflated in keeping with changes underlying COPD, unchanged. Patchy pulmonary infiltrate is seen within the mid lung zones possibly infectious or inflammatory in acute setting. No  pneumothorax or pleural effusion. Cardiac size is within normal limits. The pulmonary vascularity is normal. No acute bone abnormality. IMPRESSION: Multifocal sparse pulmonary infiltrate, possibly infectious or inflammatory in the acute setting. COPD. Electronically Signed   By: Fidela Salisbury M.D.   On: 07/14/2021 19:17    Procedures .Critical Care  Date/Time: 07/14/2021 7:29 PM Performed by: Luna Fuse, MD Authorized by: Luna Fuse, MD   Critical care provider statement:    Critical care time (minutes):  30   Critical care time was exclusive of:  Separately billable procedures and treating other patients and teaching time   Critical care was necessary to treat or prevent imminent or life-threatening deterioration of the following conditions:  Respiratory failure   Medications Ordered in ED Medications  sodium chloride 0.9 % bolus 500 mL (0 mLs Intravenous Stopped 07/14/21 1738)  sodium chloride 0.9 % bolus 1,000 mL (0 mLs Intravenous Stopped 07/14/21 1857)  potassium chloride SA (KLOR-CON) CR tablet 40 mEq (40 mEq Oral Given 07/14/21 1743)    ED Course  I have reviewed the triage vital signs and the nursing notes.  Pertinent labs & imaging results that were available during my care of the patient were reviewed by me and considered in my medical decision making (see chart for details).    MDM Rules/Calculators/A&P                           Labs show white count of 8 hemoglobin 12.  Chemistry shows a creatinine of 2.9.  Baseline creatinine around 2.0.  Potassium is normal 3.4 bicarb 26.  Patient given initially 5 or cc bolus of fluids, subsequently given a liter bolus afterwards.  O2 saturation low normal at 92% on room air which is appropriate.  Chest x-ray consistent with COVID infection.  Patient advised outpatient follow-up with his nephrologist within the week.  However his O2 saturation dropped in the ER to 87% on room air at rest.  At this point I recommend that he  stay in the hospital.  Patient states he did have oxygen that he used several years ago and he thinks he has a tank at home but he has not used it for several years.  Patient however refused to stay in the hospital.  Has decision-making capacity, leaving [facility].  Final Clinical Impression(s) / ED Diagnoses Final diagnoses:  ZYSAY-30 virus infection  Hypoxemia  Acute kidney injury [facility])    Rx / DC Orders ED Discharge Orders          Ordered    azithromycin (ZITHROMAX Z-PAK) 250 MG tablet        07/14/21 1928             Luna Fuse, MD 07/14/21 1930

## 2021-07-14 NOTE — ED Triage Notes (Signed)
Reports he was diagnosed with covid a week ago yesterday.  Reports his family doctor sent him here to be checked for dehydration.  Does endorse a productive cough with green sputum.

## 2021-07-14 NOTE — Telephone Encounter (Signed)
I am very concerned about him. One of biggest risks for my patients so far has been dehydration. I am worried about state of kidney function- I want to recommend he proceed to [facility] for evaluation ASAP please- if he cannot get to the car would recommend EMS. He is going to need labs and virtual visit will not give [place] the info we need in his case.

## 2021-07-14 NOTE — Discharge Instructions (Addendum)
Return if your symptoms worsen or if you change your mind or would like additional medical help.

## 2021-07-14 NOTE — Telephone Encounter (Signed)
That's reasonable - I just need him assessed ASAP with labs due to level of debility. Reason I recommended [facility] was in case renal function was decreased and he needed admission. I wonder if he will need some fluids based on reported symptoms

## 2021-07-14 NOTE — Telephone Encounter (Signed)
Please advise 

## 2021-07-14 NOTE — Telephone Encounter (Signed)
Pt's wife called concerned about Brian Graham is covid positive. She stated he has barley eaten in 6 days. She stated that he is now having diarrhea. She stated that he can barely get out of bed and he is having trouble walking.  I offered a virtual appt with another provider but she stated that he will only see DR [name]. Please Advise.

## 2021-07-15 NOTE — Telephone Encounter (Signed)
I have scheduled patient.    Patient is aware.

## 2021-07-15 NOTE — Telephone Encounter (Signed)
Looks like he has possible covid pneumonia. Kidney function looks strained as well. Lets at least set up a virtual for 07/16/21 at 11:40 AM to discuss now that we at least have labs

## 2021-07-15 NOTE — Telephone Encounter (Signed)
Patient seen at [facility] ED and left AMA

## 2021-07-16 ENCOUNTER — Encounter: Payer: Self-pay | Admitting: Family Medicine

## 2021-07-16 ENCOUNTER — Telehealth (INDEPENDENT_AMBULATORY_CARE_PROVIDER_SITE_OTHER): Payer: Medicare HMO | Admitting: Family Medicine

## 2021-07-16 VITALS — BP 119/67 | Temp 98.1°F | Ht 69.0 in

## 2021-07-16 DIAGNOSIS — I10 Essential (primary) hypertension: Secondary | ICD-10-CM

## 2021-07-16 DIAGNOSIS — U071 COVID-19: Secondary | ICD-10-CM

## 2021-07-16 DIAGNOSIS — N184 Chronic kidney disease, stage 4 (severe): Secondary | ICD-10-CM

## 2021-07-16 DIAGNOSIS — J1282 Pneumonia due to coronavirus disease 2019: Secondary | ICD-10-CM | POA: Diagnosis not present

## 2021-07-16 NOTE — Progress Notes (Signed)
Phone [phone] Virtual visit via Video note   Subjective:  Chief complaint: Chief Complaint  Patient presents with   Covid Positive    Pneumonia , Kidney function strained. Patient states that he is feel a little better and he is starting to eating a little more.    This visit type was conducted due to national recommendations for restrictions regarding the COVID-19 Pandemic (e.g. social distancing).  This format is felt to be most appropriate for this patient at this time balancing risks to patient and risks to population by having him in for in person visit.  No physical exam was performed (except for noted visual exam or audio findings with Telehealth visits).    Our team/I connected with Brian Graham at 11:40 AM EDT by a video enabled telemedicine application ([email or web address] or caregility through epic) and verified that I am speaking with the correct person using two identifiers.  Location patient: [place] Location provider: [facility], office Persons participating in the virtual visit:  patient  Our team/I discussed the limitations of evaluation and management by telemedicine and the availability of in person appointments. In light of current covid-19 pandemic, patient also understands that we are trying to protect them by minimizing in office contact if at all possible.  The patient expressed consent for telemedicine visit and agreed to proceed. Patient understands insurance will be billed.   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    Priority: High   Hx of CABG 08/05/2016    Priority: High   CAD (coronary artery disease) of artery bypass graft 08/05/2016    Priority: High   Vitamin D deficiency 01/20/2021    Priority: Medium   Vitamin B12 deficiency 01/20/2021    Priority: Medium   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    Priority: Medium   History of adenomatous polyp of colon 04/22/2017    Priority: Medium   Hyperlipidemia 04/04/2017    Priority: Medium   Hypothyroidism (acquired) 04/04/2017    Priority: Medium   Essential hypertension 08/05/2016    Priority: Medium   Essential tremor 06/11/2016    Priority: Medium   Perennial allergic rhinitis 01/24/2018    Priority: Low   Anxiety 07/24/2017    Priority: Low   Pyelonephritis 04/23/2017    Priority: Low   Aortic atherosclerosis ([place]) 04/13/2017    Priority: Low   Seasonal allergies 04/04/2017    Priority: Low   Myalgia 10/04/2016    Priority: Low   Dysuria 08/23/2016    Priority: Low   Chronic pain of both knees 08/23/2016    Priority: Low   Tension headache 06/11/2016    Priority: Low   Hearing abnormally acute, left 04/03/2020   Healthcare maintenance 04/03/2020   Constipation 09/03/2017    Medications- reviewed and updated Current Outpatient Medications  Medication Sig Dispense Refill   acetaminophen (TYLENOL) 500 MG tablet Take 500-1,000 mg by mouth every 6 (six)  hours as needed (for pain).      albuterol (PROVENTIL) (2.5 MG/3ML) 0.083% nebulizer solution Take 3 mLs (2.5 mg total) by nebulization every 6 (six) hours as needed for wheezing or shortness of breath. J44.9 300 mL 11   albuterol (VENTOLIN HFA) 108 (90 Base) MCG/ACT inhaler USE 2 INHALATIONS EVERY 4 HOURS AS NEEDED 25.5 g 2   aspirin EC 81 MG tablet Take 81 mg by mouth daily.     atenolol (TENORMIN) 50 MG tablet TAKE 1 TABLET DAILY 90 tablet 3   azithromycin (ZITHROMAX Z-PAK) 250  MG tablet Take 2 tablets by mouth on day 1.  Then take 1 tablet by mouth on days 2 through 5. 6 tablet 0   budesonide-formoterol (SYMBICORT) 160-4.5 MCG/ACT inhaler USE 2 INHALATIONS TWICE A DAY 30.6 g 3   desonide (DESOWEN) 0.05 % cream Apply topically 2 (two) times daily. 7-10 days as needed for eczema 30 g 5   levothyroxine (SYNTHROID) 112 MCG tablet TAKE 1 TABLET DAILY BEFORE BREAKFAST 90 tablet 3   mirtazapine (REMERON) 15 MG tablet Take 1 tablet (15 mg total) by mouth at bedtime. 90 tablet 1   rosuvastatin (CRESTOR) 40 MG tablet TAKE 1 TABLET DAILY (NEED APPOINTMENT) 90 tablet 3   SPIRIVA RESPIMAT 2.5 MCG/ACT AERS USE 2 INHALATIONS DAILY 12 g 1   traMADol (ULTRAM) 50 MG tablet TAKE 1 TABLET (50 MG TOTAL) BY MOUTH 2 (TWO) TIMES DAILY AS NEEDED FOR MODERATE PAIN OR SEVERE PAIN. 60 tablet 5   No current facility-administered medications for this visit.     Objective:  BP 119/67   Temp 98.1 F (36.7 C) (Temporal)   Ht 5\' 9"  (1.753 m)   SpO2 99%   BMI 22.15 kg/m  self reported vitals Gen: NAD, resting comfortably Lungs: nonlabored, normal respiratory rate Skin: appears dry, no obvious rash     Assessment and Plan   #COVID-19 pneumonia S: I saw patient via video visit on 07/07/2021 for COVID-19-he was started on molnupiravir at that time.  Patient called our office on 07/14/2021 due to barely eating for 6 days, diarrhea, barely able to get out of bed, trouble walking.  I was very concerned due to patient renal function/CKD stage IV and prior hospitalization where creatinine got above 7.  I was concerned about dehydration and need for fluid repletion-directed patient to the hospital as she went to the emergency room on July 14, 2021 and did have creatinine elevation up to 2.93 from 2.11 baseline but thankfully GFR remained in the 20s.  Patient was given fluid boluses.  Chest x-ray showed findings concerning for COVID-pneumonia as below.  Patient was also treated with azithromycin in case  there was a bacterial element.  ED provider recommended hospitalization but patient declined and left against medical advice.  Patient at rest had oxygen level of 92% but with ambulation dropped to 87%-patient did have oxygen tank at home still available though and he preferred to go home and use this   DG Chest [facility] 1 View  Result Date: 07/14/2021 CLINICAL DATA:  Dyspnea, productive cough EXAM: PORTABLE CHEST 1 VIEW COMPARISON:  None. FINDINGS: Lungs are mildly hyperinflated in keeping with changes underlying COPD, unchanged. Patchy pulmonary infiltrate is seen within the mid lung zones possibly infectious or inflammatory in acute setting. No pneumothorax or pleural effusion. Cardiac size is within normal limits. The pulmonary vascularity is normal. No acute bone abnormality. IMPRESSION: Multifocal sparse pulmonary infiltrate, possibly infectious or inflammatory in the acute setting. COPD. Electronically  Signed   By: Fidela Salisbury M.D.   On: 07/14/2021 19:17     Patient states he is starting to improve. He is feeling better. Still testing positive for covid 12 days for now including on rapid test today while wife has tested negative for COVID for the last 2 days. He has been able to drink a lot more- starting to eat some. Getting more mobile. Still very fatigued. Did feel some mild improvement with fluids in hospital.  He is wearing oxygen and we discussed primarily try to keep level above 92% for comfort but likely would not want this to get above 95% or so-would remove oxygen if this happened  He does note Lingering cough with brown sputum- getting significantly better. Feels better when he coughs this up.  Denies significant wheezing-does get some shortness of breath and nebulizer helps A/P: 70 year old gentleman with COVID-19 pneumonia who steadily improving.   I did encourage patient to finish azithromycin.  If he has new or worsening symptoms to let me know  # CKD IV S: following with Dr. Justin Mend.  likely going to go on peritoneal dialysis within 27m onths of 11/26/2019 per patient report after visit with Dr. Justin Mend but later filtration rate improved to greater than 30!-As noted above did have worsening renal function with covid-19 A/P: Hopefully improving at this point.  Did have decreased renal function/creatinine up to 2.9 from around 2.1 but I suspect this will improve with hydration-he also has follow-up with nephrology next week and I encouraged him to keep this visit to have renal function rechecked.   # Hypertension S: compliant with amlodipine 5 mg, atenolol 50mg . Off benazepril 20mg  due to renal function BP Readings from Last 3 Encounters:  07/16/21 119/67  07/14/21 140/73  07/07/21 128/70  A/P: Blood pressure well controlled at home-continue current medication  Recommended follow up:  Future Appointments  Date Time Provider Gam[place]  10/28/2021  9:40 AM [name] Brayton Mars, MD [id number]    Lab/Order associations:   ICD-10-CM   1. Pneumonia due to COVID-19 virus  U07.1    J12.82     2. Essential hypertension  I10     3. Chronic kidney disease (CKD), stage IV (severe) (HCC)  N18.4         Return precautions advised.  Garret Reddish, MD

## 2021-07-16 NOTE — Patient Instructions (Addendum)
Health Maintenance Due  Topic Date Due   DEXA SCAN Please discuss at next in office visit.  06/21/2020   INFLUENZA VACCINE Not available in office yet, Patient states that he does get his flu vaccine every year sometimes here or at the pharmacy.  06/22/2021    Recommended follow up: No follow-ups on file.

## 2021-07-21 ENCOUNTER — Encounter: Payer: Self-pay | Admitting: Family Medicine

## 2021-07-29 ENCOUNTER — Other Ambulatory Visit: Payer: Self-pay

## 2021-07-29 ENCOUNTER — Telehealth: Payer: Self-pay

## 2021-07-29 DIAGNOSIS — J1282 Pneumonia due to coronavirus disease 2019: Secondary | ICD-10-CM

## 2021-07-29 DIAGNOSIS — U071 COVID-19: Secondary | ICD-10-CM

## 2021-07-29 NOTE — Telephone Encounter (Signed)
Pt called stating that Dr [name] told him to call us to schedule a chest x-ray. I do not see orders put it in for an X-ray. Please Advise.

## 2021-07-29 NOTE — Telephone Encounter (Signed)
Xray ordered, ok to schedule.

## 2021-07-30 NOTE — Telephone Encounter (Signed)
Called pt and pt is aware for X-ray.

## 2021-07-31 ENCOUNTER — Other Ambulatory Visit: Payer: Self-pay

## 2021-07-31 ENCOUNTER — Ambulatory Visit (INDEPENDENT_AMBULATORY_CARE_PROVIDER_SITE_OTHER)
Admission: RE | Admit: 2021-07-31 | Discharge: 2021-07-31 | Disposition: A | Discharge status: Home / Self Care | Payer: Medicare HMO | Source: Ambulatory Visit | Attending: Family Medicine | Admitting: Family Medicine

## 2021-07-31 DIAGNOSIS — J1282 Pneumonia due to coronavirus disease 2019: Secondary | ICD-10-CM | POA: Diagnosis not present

## 2021-07-31 DIAGNOSIS — U071 COVID-19: Secondary | ICD-10-CM | POA: Diagnosis not present

## 2021-08-11 ENCOUNTER — Other Ambulatory Visit: Payer: Self-pay | Admitting: Pulmonary Disease

## 2021-08-19 ENCOUNTER — Telehealth: Payer: Self-pay | Admitting: *Deleted

## 2021-08-19 ENCOUNTER — Ambulatory Visit: Payer: Medicare HMO | Admitting: Adult Health

## 2021-08-19 ENCOUNTER — Encounter: Payer: Self-pay | Admitting: Adult Health

## 2021-08-19 ENCOUNTER — Other Ambulatory Visit: Payer: Self-pay

## 2021-08-19 DIAGNOSIS — J3089 Other allergic rhinitis: Secondary | ICD-10-CM

## 2021-08-19 DIAGNOSIS — Z87891 Personal history of nicotine dependence: Secondary | ICD-10-CM | POA: Diagnosis not present

## 2021-08-19 DIAGNOSIS — J449 Chronic obstructive pulmonary disease, unspecified: Secondary | ICD-10-CM | POA: Diagnosis not present

## 2021-08-19 MED ORDER — BUDESONIDE-FORMOTEROL FUMARATE 160-4.5 MCG/ACT IN AERO: INHALATION_SPRAY | RESPIRATORY_TRACT | 3 refills | Status: DC

## 2021-08-19 MED ORDER — ALBUTEROL SULFATE HFA 108 (90 BASE) MCG/ACT IN AERS: INHALATION_SPRAY | RESPIRATORY_TRACT | 2 refills | Status: DC

## 2021-08-19 MED ORDER — SPIRIVA RESPIMAT 2.5 MCG/ACT IN AERS: 2.0000 | INHALATION_SPRAY | Freq: Every day | RESPIRATORY_TRACT | 2 refills | Status: DC

## 2021-08-19 NOTE — Telephone Encounter (Signed)
Secure chat message sent to Doroteo Glassman RN with [facility] program to get patient re established with program.

## 2021-08-19 NOTE — Addendum Note (Signed)
Addended by: Vanessa Barbara on: 08/19/2021 02:23 PM   Modules accepted: Orders

## 2021-08-19 NOTE — Assessment & Plan Note (Signed)
Flare of allergic rhinitis.  Patient may use saline and saline nasal spray.  Try chlor tabs 4 mg at bedtime as needed

## 2021-08-19 NOTE — Assessment & Plan Note (Signed)
Patient has severe COPD with emphysema.  Recent flare last month with associated COVID-19 infection and COVID-pneumonia.  Patient is clinically stable.  We will continue on Symbicort and Spiriva. Will contact the lung cancer screening program to get patient restarted in the program.  Recent chest x-ray did show a 9 mm left mid nodular density.  This will need follow-up  Plan  Patient Instructions  Continue on Symbicort 2 puffs Twice daily, rinse after use .  Continue on Spiriva 2 puffs daily .  Albuterol inhaler As needed   Restart LDCT chest screening program.  Saline nasal spray Twice daily.  Saline nasal gel At bedtime .  Chlorpheniramine 4mg  At bedtime  As needed  drainage.  Activity as tolerated.  Follow up with Dr. Verlee Monte or Baillie Mohammad NP and As needed

## 2021-08-19 NOTE — Progress Notes (Signed)
@Patient  ID: Brian Graham, male    DOB: May 10, 1951, 70 y.o.   MRN: [id number]  Chief Complaint  Patient presents with   Follow-up    Referring provider: [name], MD  HPI: 70 year old male former smoker followed for COPD Medical history significant for hypertension, chronic kidney disease stage IV, CAD   TEST/EVENTS :  02/14/2020-eosinophils relative 3, eosinophils absolute 0.3   06/28/2019-CT chest lung cancer screening-moderate centrilobular and paraseptal emphysema, lung RADS 4 a suspicious   10/01/2019-CT chest lung cancer screening nodule follow-up-lung RADS 2, benign appearance, continue annual screening, previously scattered pulmonary nodules are all stable to decreased in size   01/24/2018-spirometry-FVC 3.59 (92% predicted), ratio 0.36 (48% predicted), FEV1 1.31 (45% predicted)    08/19/2021 Follow up : COPD , Emphysema  Patient returns for a follow-up visit.  Patient has underlying COPD with emphysema.  Last seen May 2021.  Patient remains on Symbicort and Spiriva.  Patient says overall was doing okay until about a month ago.   Patient had COVID-19 infection 1 month ago.  Most likely had COVID-pneumonia as well as chest x-ray showed patchy infiltrate within the mid lung zones. He was seen in ER with significant flu symptoms, given IVF . Treated with Azithromycin.  Follow-up chest x-ray on July 31, 2021 showed emphysematous changes and a nodular density around 9 mm in the left midlung. Previously participated in LDCT screening program, CT chest 09/2019 Lung RADS 2 . Feels he missed last year due to pandemic .  We discussed restarting program.  Patient complains of chronic nasal drainage and postnasal drip. He denies any weight loss, hemoptysis, chest pain, orthopnea.  Says he gets winded with activities.  Has low activity tolerance.  And has not regained his energy since COVID infection.  Previously on oxygen with activity as needed. Rarely uses.  Says O2 sats  at home remain above 90% at rest and walking .    Allergies  Allergen Reactions   Contrast Media [Iodinated Diagnostic Agents] Other (See Comments)    NOT ALLOWED DUE TO KIDNEY ISSUES.   Other Itching and Rash    Gel used for ultrasound SEVERELY broke out the skin    Keflex [Cephalexin] Diarrhea, Nausea Only and Other (See Comments)    Insomnia   Sulfa Antibiotics Other (See Comments)    Insomnia   Ciprofloxacin Diarrhea   Levaquin [Levofloxacin] Other (See Comments)    Insomnia    Omeprazole Diarrhea    Immunization History  Administered Date(s) Administered   Fluad Quad(high Dose 65+) 07/31/2019   Influenza Split 08/30/2013, 08/18/2016   Influenza, High Dose Seasonal PF 09/14/2017   Influenza,inj,Quad PF,6+ Mos 07/23/2016   Influenza,inj,quad, With Preservative 07/23/2016   Influenza-Unspecified 09/01/2018, 07/23/2020   PFIZER(Purple Top)SARS-COV-2 Vaccination 12/18/2019, 01/08/2020   Pneumococcal Conjugate-13 04/03/2013   Pneumococcal Polysaccharide-23 06/10/2016   Pneumococcal-Unspecified 04/03/2013, 07/02/2016   Tdap 06/11/2007, 10/06/2014    Past Medical History:  Diagnosis Date   Anxiety    Arthritis    "left knee" (08/05/2016)   Bruises easily    CAD (coronary artery disease) of artery bypass graft 08/05/2016   Around age 11. CABG - 3 vessel.    Chronic kidney disease    Colon polyp    COPD, severe ([place]) 08/23/2016   Alpha 1 studies normal per prior pulmonary group notes Smoked 40 years, quit in 2012 June 2016 PFT from prior pulmonary group: "significant obstruction" FEV1 1.58L (47% pred), Residual volume 171% pred, DLCO 59% pred Simple Spirometry>> 09/15/2016  ratio 42% FEV1 1.02 L / 31%    Emphysema of lung (HCC)    Essential tremor 06/11/2016   Plans to see Dr. Carles Collet   Former smoker 09/01/2016   Quit 2012. 40 pack years at least.    GERD (gastroesophageal reflux disease)    Headache    History of blood transfusion 08/1997   "when he had his heart surgery"    History of shingles 1970-2013 X 3   HTN (hypertension) 08/05/2016   Amlodipine 5mg , atenolol 50mg  BID, benazepril 20mg    Hyperlipemia    Hypothyroidism    Lumbar disc disease    MGUS (monoclonal gammopathy of unknown significance)    Myocardial infarction ([place]) 08/1997   Peripheral vascular disease (HCC)    PONV (postoperative nausea and vomiting)    Urinary tract infection 09/03/2017   Wears glasses     Tobacco History: Social History   Tobacco Use  Smoking Status Former   Packs/day: 1.00   Years: 45.00   Pack years: 45.00   Types: Cigarettes   Quit date: 07/10/2017   Years since quitting: 4.1  Smokeless Tobacco Never   Counseling given: Not Answered   Outpatient Medications Prior to Visit  Medication Sig Dispense Refill   acetaminophen (TYLENOL) 500 MG tablet Take 500-1,000 mg by mouth every 6 (six) hours as needed (for pain).      albuterol (PROVENTIL) (2.5 MG/3ML) 0.083% nebulizer solution Take 3 mLs (2.5 mg total) by nebulization every 6 (six) hours as needed for wheezing or shortness of breath. J44.9 300 mL 11   albuterol (VENTOLIN HFA) 108 (90 Base) MCG/ACT inhaler USE 2 INHALATIONS EVERY 4 HOURS AS NEEDED 25.5 g 2   aspirin EC 81 MG tablet Take 81 mg by mouth daily.     atenolol (TENORMIN) 50 MG tablet TAKE 1 TABLET DAILY 90 tablet 3   budesonide-formoterol (SYMBICORT) 160-4.5 MCG/ACT inhaler USE 2 INHALATIONS TWICE A DAY 30.6 g 3   desonide (DESOWEN) 0.05 % cream Apply topically 2 (two) times daily. 7-10 days as needed for eczema 30 g 5   levothyroxine (SYNTHROID) 112 MCG tablet TAKE 1 TABLET DAILY BEFORE BREAKFAST 90 tablet 3   mirtazapine (REMERON) 15 MG tablet Take 1 tablet (15 mg total) by mouth at bedtime. 90 tablet 1   rosuvastatin (CRESTOR) 40 MG tablet TAKE 1 TABLET DAILY (NEED APPOINTMENT) 90 tablet 3   SPIRIVA RESPIMAT 2.5 MCG/ACT AERS USE 2 INHALATIONS DAILY 12 g 1   traMADol (ULTRAM) 50 MG tablet TAKE 1 TABLET (50 MG TOTAL) BY MOUTH 2 (TWO) TIMES DAILY  AS NEEDED FOR MODERATE PAIN OR SEVERE PAIN. 60 tablet 5   azithromycin (ZITHROMAX Z-PAK) 250 MG tablet Take 2 tablets by mouth on day 1.  Then take 1 tablet by mouth on days 2 through 5. 6 tablet 0   No facility-administered medications prior to visit.     Review of Systems:   Constitutional:   No  weight loss, night sweats,  Fevers, chills,  +fatigue, or  lassitude.  HEENT:   No headaches,  Difficulty swallowing,  Tooth/dental problems, or  Sore throat,                No sneezing, itching, ear ache,  +nasal congestion, post nasal drip,   CV:  No chest pain,  Orthopnea, PND, swelling in lower extremities, anasarca, dizziness, palpitations, syncope.   GI  No heartburn, indigestion, abdominal pain, nausea, vomiting, diarrhea, change in bowel habits, loss of appetite, bloody stools.  Resp: .  No chest wall deformity  Skin: no rash or lesions.  GU: no dysuria, change in color of urine, no urgency or frequency.  No flank pain, no hematuria   MS:  No joint pain or swelling.  No decreased range of motion.  No back pain.    Physical Exam  BP (!) 150/80 (BP Location: Left Arm, Patient Position: Sitting, Cuff Size: Normal)   Pulse 91   Temp 97.8 F (36.6 C) (Oral)   Ht 5\' 9"  (1.753 m)   Wt 151 lb 3.2 oz (68.6 kg)   SpO2 97%   BMI 22.33 kg/m   GEN: A/Ox3; pleasant , NAD, thin male    HEENT:  /AT,   NOSE-clear, THROAT-clear, no lesions, no postnasal drip or exudate noted.   NECK:  Supple w/ fair ROM; no JVD; normal carotid impulses w/o bruits; no thyromegaly or nodules palpated; no lymphadenopathy.    RESP  few trace rhonchi  no accessory muscle use, no dullness to percussion  CARD:  RRR, no m/r/g, no peripheral edema, pulses intact, no cyanosis or clubbing.  GI:   Soft & nt; nml bowel sounds; no organomegaly or masses detected.   Musco: Warm bil, no deformities or joint swelling noted.   Neuro: alert, no focal deficits noted.    Skin: Warm, no lesions or  rashes    Lab Results:  CBC    Component Value Date/Time   WBC 8.0 07/14/2021 1626   RBC 4.34 07/14/2021 1626   HGB 12.5 (L) 07/14/2021 1626   HGB 10.2 (L) 02/14/2020 0833   HCT 38.9 (L) 07/14/2021 1626   PLT 349 07/14/2021 1626   PLT 217 02/14/2020 0833   MCV 89.6 07/14/2021 1626   MCH 28.8 07/14/2021 1626   MCHC 32.1 07/14/2021 1626   RDW 15.4 07/14/2021 1626   LYMPHSABS 0.8 07/14/2021 1626   MONOABS 1.3 (H) 07/14/2021 1626   EOSABS 0.0 07/14/2021 1626   BASOSABS 0.0 07/14/2021 1626    BMET    Component Value Date/Time   NA 134 (L) 07/14/2021 1626   NA 141 03/23/2021 0000   K 3.4 (L) 07/14/2021 1626   CL 95 (L) 07/14/2021 1626   CO2 26 07/14/2021 1626   GLUCOSE 104 (H) 07/14/2021 1626   BUN 43 (H) 07/14/2021 1626   BUN 25 (A) 03/23/2021 0000   CREATININE 2.93 (H) 07/14/2021 1626   CREATININE 2.63 (H) 01/24/2020 1057   CALCIUM 8.9 07/14/2021 1626   GFRNONAA 22 (L) 07/14/2021 1626   GFRNONAA 24 (L) 01/24/2020 1057   GFRAA 23 (L) 08/22/2020 1344   GFRAA 28 (L) 01/24/2020 1057    BNP    Component Value Date/Time   BNP 209.6 (H) 07/10/2017 2310    ProBNP No results found for: PROBNP  Imaging: DG Chest 2 View  Result Date: 07/31/2021 CLINICAL DATA:  Pneumonia.  Chest pressure. EXAM: CHEST - 2 VIEW COMPARISON:  07/14/2021 FINDINGS: Subtle area of architectural distortion noted right lung, stable in the interval. Areas of architectural distortion or scarring are noted in the left lung including 9 mm nodular component superimposed on the posterior left eighth rib. Lungs are hyperexpanded without substantial pleural effusion. No new focal consolidative opacities suggest pneumonia. The cardiopericardial silhouette is within normal limits for size. The visualized bony structures of the thorax show no acute abnormality. IMPRESSION: 1. No acute cardiopulmonary findings. 2. Areas of architectural distortion or scarring bilaterally. 3. Nodular density left mid lung likely  scar. Follow-up chest x-ray in  3 months recommended to ensure stability. Electronically Signed   By: Misty Stanley M.D.   On: 07/31/2021 16:57      No flowsheet data found.  No results found for: NITRICOXIDE      Assessment & Plan:   COPD, severe ([place]) Patient has severe COPD with emphysema.  Recent flare last month with associated COVID-19 infection and COVID-pneumonia.  Patient is clinically stable.  We will continue on Symbicort and Spiriva. Will contact the lung cancer screening program to get patient restarted in the program.  Recent chest x-ray did show a 9 mm left mid nodular density.  This will need follow-up  Plan  Patient Instructions  Continue on Symbicort 2 puffs Twice daily, rinse after use .  Continue on Spiriva 2 puffs daily .  Albuterol inhaler As needed   Restart LDCT chest screening program.  Saline nasal spray Twice daily.  Saline nasal gel At bedtime .  Chlorpheniramine 4mg  At bedtime  As needed  drainage.  Activity as tolerated.  Follow up with Dr. Verlee Monte or Brian Schnelle NP and As needed         Perennial allergic rhinitis Flare of allergic rhinitis.  Patient may use saline and saline nasal spray.  Try chlor tabs 4 mg at bedtime as needed  Former smoker Former smoker qualifies for the lung cancer screening program. Patient missed his 2021 scan.  We will contact screening program to reenrolled.     Rexene Edison, NP 08/19/2021

## 2021-08-19 NOTE — Patient Instructions (Addendum)
Continue on Symbicort 2 puffs Twice daily, rinse after use .  Continue on Spiriva 2 puffs daily .  Albuterol inhaler As needed   Restart LDCT chest screening program.  Saline nasal spray Twice daily.  Saline nasal gel At bedtime .  Chlorpheniramine 4mg  At bedtime  As needed  drainage.  Activity as tolerated.  Follow up with Dr. Verlee Monte or Dorthea Maina NP and As needed

## 2021-08-19 NOTE — Assessment & Plan Note (Signed)
Former smoker qualifies for the lung cancer screening program. Patient missed his 2021 scan.  We will contact screening program to reenrolled.

## 2021-09-03 ENCOUNTER — Other Ambulatory Visit: Payer: Self-pay

## 2021-09-03 ENCOUNTER — Ambulatory Visit (INDEPENDENT_AMBULATORY_CARE_PROVIDER_SITE_OTHER): Payer: Medicare HMO

## 2021-09-03 DIAGNOSIS — Z23 Encounter for immunization: Secondary | ICD-10-CM

## 2021-09-13 ENCOUNTER — Other Ambulatory Visit: Payer: Self-pay | Admitting: Family Medicine

## 2021-09-14 ENCOUNTER — Other Ambulatory Visit: Payer: Self-pay

## 2021-09-14 MED ORDER — MIRTAZAPINE 15 MG PO TABS: 15.0000 mg | ORAL_TABLET | Freq: Every day | ORAL | 3 refills | Status: DC

## 2021-09-14 NOTE — Telephone Encounter (Signed)
Please advise on request from pt to reorder the Mirtazapine and amount of refills as well.

## 2021-10-11 ENCOUNTER — Other Ambulatory Visit: Payer: Self-pay

## 2021-10-11 DIAGNOSIS — I745 Embolism and thrombosis of iliac artery: Secondary | ICD-10-CM

## 2021-10-11 DIAGNOSIS — I739 Peripheral vascular disease, unspecified: Secondary | ICD-10-CM

## 2021-10-11 NOTE — Addendum Note (Signed)
Addended byDoylene Bode on: 10/11/2021 06:44 PM   Modules accepted: Orders

## 2021-10-21 NOTE — Progress Notes (Signed)
Phone [phone] In person visit   Subjective:   Brian Graham is a 70 y.o. year old very pleasant male patient who presents for/with See problem oriented charting Chief Complaint  Patient presents with   Follow-up   Hypertension    This visit occurred during the SARS-CoV-2 public health emergency.  Safety protocols were in place, including screening questions prior to the visit, additional usage of staff PPE, and extensive cleaning of exam room while observing appropriate contact time as indicated for disinfecting solutions.   Past Medical History-  Patient Active Problem List   Diagnosis Date Noted   Chronic kidney disease (CKD), stage IV (severe) ([place]) 08/13/2019    Priority: High   S/p nephrectomy 12/05/2018    Priority: High   MGUS (monoclonal gammopathy of unknown significance) 08/02/2018    Priority: High   Osteoporosis 06/26/2018    Priority: High   Atherosclerosis of native arteries of extremity with intermittent claudication ([place]) 07/06/2017    Priority: High   Weight loss 04/23/2017    Priority: High   Former smoker 09/01/2016    Priority: High   COPD, severe ([place]) 08/23/2016    Priority: High   Hx of CABG 08/05/2016    Priority: High   CAD (coronary artery disease) of artery bypass graft 08/05/2016    Priority: High   Vitamin D deficiency 01/20/2021    Priority: Medium    Vitamin B12 deficiency 01/20/2021    Priority: Medium    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    Priority: Medium    History of adenomatous polyp of colon 04/22/2017    Priority: Medium    Hyperlipidemia 04/04/2017    Priority: Medium    Hypothyroidism (acquired) 04/04/2017    Priority: Medium    Essential hypertension 08/05/2016    Priority: Medium    Essential tremor 06/11/2016    Priority: Medium    Perennial allergic rhinitis 01/24/2018    Priority: Low   Anxiety 07/24/2017    Priority: Low   Pyelonephritis 04/23/2017    Priority: Low   Aortic atherosclerosis  ([place]) 04/13/2017    Priority: Low   Seasonal allergies 04/04/2017    Priority: Low   Myalgia 10/04/2016    Priority: Low   Dysuria 08/23/2016    Priority: Low   Chronic pain of both knees 08/23/2016    Priority: Low   Tension headache 06/11/2016    Priority: Low   Hearing abnormally acute, left 04/03/2020   Healthcare maintenance 04/03/2020   Constipation 09/03/2017    Medications- reviewed and updated Current Outpatient Medications  Medication Sig Dispense Refill   acetaminophen (TYLENOL) 500 MG tablet Take 500-1,000 mg by mouth every 6 (six) hours as needed (for pain).      albuterol (PROVENTIL) (2.5 MG/3ML) 0.083% nebulizer solution Take 3 mLs (2.5 mg total) by nebulization every 6 (six) hours as needed for wheezing or shortness of breath. J44.9 300 mL 11   albuterol (VENTOLIN HFA) 108 (90 Base) MCG/ACT inhaler USE 2 INHALATIONS EVERY 4 HOURS AS NEEDED 25.5 g 2   aspirin EC 81 MG tablet Take 81 mg by mouth daily.     atenolol (TENORMIN) 50 MG tablet TAKE 1 TABLET DAILY 90 tablet 3   budesonide-formoterol (SYMBICORT) 160-4.5 MCG/ACT inhaler USE 2 INHALATIONS TWICE A DAY 30.6 g 3   desonide (DESOWEN) 0.05 % cream Apply topically 2 (two) times daily. 7-10 days as needed for eczema 30 g 5   levothyroxine (SYNTHROID) 112 MCG tablet TAKE 1  TABLET DAILY BEFORE BREAKFAST 90 tablet 3   mirtazapine (REMERON) 15 MG tablet Take 1 tablet (15 mg total) by mouth at bedtime. 90 tablet 3   rosuvastatin (CRESTOR) 40 MG tablet TAKE 1 TABLET DAILY (NEED APPOINTMENT) 90 tablet 3   Tiotropium Bromide Monohydrate (SPIRIVA RESPIMAT) 2.5 MCG/ACT AERS Inhale 2 puffs into the lungs daily. 12 g 2   traMADol (ULTRAM) 50 MG tablet TAKE 1 TABLET (50 MG TOTAL) BY MOUTH 2 (TWO) TIMES DAILY AS NEEDED FOR MODERATE PAIN OR SEVERE PAIN. 60 tablet 5   No current facility-administered medications for this visit.     Objective:  BP 128/82 Comment: last home reading  Pulse 70   Temp 98 F (36.7 C)   Ht 5\' 9"   (1.753 m)   Wt 154 lb 6.4 oz (70 kg)   SpO2 93%   BMI 22.80 kg/m  Gen: NAD, resting comfortably CV: RRR no murmurs rubs or gallops Lungs: CTAB no crackles, wheeze, rhonchi Ext: no edema Skin: warm, dry    Assessment and Plan   #Lung density-repeat chest x-ray ordered based off of "Nodular density left mid lung likely scar. Follow-up chest x-ray in 3 months recommended to ensure stability." -Patient does have COPD and is on Spiriva and Symbicort through pulmonology -also trying to get enrolled in lung cancer screening again- upcoming visit late December with his new pulmonary doctor  # CKD IV S: followed with Dr. Justin Mend. likely going to go on peritoneal dialysis within 81months of 11/26/2019 per patient reported after visit with Dr. Justin Mend but later filtration rate improved to greater than 30!  Later GFR in August worsened again-he reports improved some back to 2.2 on creatinine with Dr. Justin Mend  A/P: overall stable- 6 weeks checks to be aggressive with monitoring.   # Hypertension S: compliant with atenolol 50mg .   -On amlodipine in past- was stopped sometime this year but BP did not increase- not sure why stopped  -Off benazepril 20mg  due to renal function Home readings #s: 120s/80s BP Readings from Last 3 Encounters:  10/28/21 128/82  08/19/21 (!) 150/80  07/16/21 119/67  A/P: some white coat hypertension likely- well controlled at home- continue currnet medicine  #hypothyroidism S: compliant On thyroid medication-levothyroxine 112 micrograms Lab Results  Component Value Date   TSH 0.507 01/13/2021   A/P:  hopefully stable- update TSH today. Continue current meds for now    #CAD with CABG history #hyperlipidemia S: Medication: Rosuvastatin 40 mg, aspirin 81 mg Lab Results  Component Value Date   CHOL 121 04/22/2021   HDL 56.60 04/22/2021   LDLCALC 45 04/22/2021   LDLDIRECT 45.0 02/07/2019   TRIG 96.0 04/22/2021   CHOLHDL 2 04/22/2021   A/P: Well-controlled last  check-continue current medication  No chest pain. Stable shortness of breath- worsened after covid but not worsening. Continue to monitor- doubt cardiac   #Appetite stimulant-patient on Remeron/mirtazepine per Dr. Janith Lima am happy to refill this if needed- he states is eating betteer and has gained some weight with this. Still with dry heaves but not worsening- sea sickness band on pressure point on wrist helpful   #osteoporosis- off fosamax with GFR under 30- not sure of benefit of repeating dexa in light of this- maybe prolia but anxious with potential for renal function to worsen  # Hx of COVID + - Pt started experiencing symptoms on 07/05/21. He returned from [facility] and noticed he was exposed 11th or 12th of August. He tested positive that Monday morning.  -  still some throat clearing after that but overall feels well.  -hearing loss after covid shot- anxious to do bivalent booster which I understand  Recommended follow up: 4-6 months for CPE Future Appointments  Date Time Provider [place]  11/10/2021  9:00 AM [facility] - [place] [facility]  11/10/2021 10:00 AM [facility] - [place] [facility]  11/10/2021 10:45 AM [facility] [facility]  11/19/2021  9:00 AM Meier, Hortencia Conradi, MD [facility] None    Lab/Order associations:   ICD-10-CM   1. Essential hypertension  I10     2. Chronic kidney disease (CKD), stage IV (severe) (HCC)  N18.4     3. History of COVID-19  Z86.16     4. Lung density on x-ray  J98.4 DG Chest 2 View    5. Hypothyroidism (acquired)  E03.9 TSH      No orders of the defined types were placed in this encounter.   I,Jada Bradford,acting as a scribe for Garret Reddish, MD.,have documented all relevant documentation on the behalf of Garret Reddish, MD,as directed by  Garret Reddish, MD while in the presence of Garret Reddish, MD.  I, Garret Reddish, MD, have reviewed all documentation for this visit. The documentation on 10/28/21 for the exam,  diagnosis, procedures, and orders are all accurate and complete.  Return precautions advised.  Garret Reddish, MD

## 2021-10-28 ENCOUNTER — Encounter: Payer: Self-pay | Admitting: Family Medicine

## 2021-10-28 ENCOUNTER — Other Ambulatory Visit: Payer: Self-pay

## 2021-10-28 ENCOUNTER — Ambulatory Visit (INDEPENDENT_AMBULATORY_CARE_PROVIDER_SITE_OTHER): Payer: Medicare HMO | Admitting: Family Medicine

## 2021-10-28 VITALS — BP 128/82 | HR 70 | Temp 98.0°F | Ht 69.0 in | Wt 154.4 lb

## 2021-10-28 DIAGNOSIS — N184 Chronic kidney disease, stage 4 (severe): Secondary | ICD-10-CM | POA: Diagnosis not present

## 2021-10-28 DIAGNOSIS — Z8616 Personal history of COVID-19: Secondary | ICD-10-CM | POA: Diagnosis not present

## 2021-10-28 DIAGNOSIS — J984 Other disorders of lung: Secondary | ICD-10-CM

## 2021-10-28 DIAGNOSIS — E039 Hypothyroidism, unspecified: Secondary | ICD-10-CM | POA: Diagnosis not present

## 2021-10-28 DIAGNOSIS — I10 Essential (primary) hypertension: Secondary | ICD-10-CM | POA: Diagnosis not present

## 2021-10-28 LAB — TSH: TSH: 0.45 u[IU]/mL (ref 0.35–5.50)

## 2021-10-28 NOTE — Patient Instructions (Addendum)
Health Maintenance Due  Topic Date Due   Zoster Vaccines- Shingrix (1 of 2)-  Please check with your pharmacy to see if they have the shingrix vaccine. If they do- please get this immunization and update [place] by phone call or mychart with dates you receive the vaccine  Never done   Pneumonia Vaccine 79+ Years old (2 - PCV)-  Discuss with pulmonology, but I believe we can hold off at this time.  07/02/2017   Please go to [facility] (updated 01/17/2020) - located [address] across the street from [place] - in the basement - Hours: 8:30-5:00 PM M-F (with lunch from 12:30- 1 PM). You do NOT need an appointment.   Please stop by lab before you go If you have mychart- we will send your results within 3 business days of [place] receiving them.  If you do not have mychart- we will call you about results within 5 business days of [place] receiving them.  *please also note that you will see labs on mychart as soon as they post. I will later go in and write notes on them- will say "notes from Dr. [name]"  Recommended follow up: Return in about 4-6 months (around 02/26/22 or 04/28/22 for physical or sooner if needed.  Happy Holidays!

## 2021-10-30 ENCOUNTER — Ambulatory Visit (INDEPENDENT_AMBULATORY_CARE_PROVIDER_SITE_OTHER)
Admission: RE | Admit: 2021-10-30 | Discharge: 2021-10-30 | Disposition: A | Discharge status: Home / Self Care | Payer: Medicare HMO | Source: Ambulatory Visit | Attending: Family Medicine | Admitting: Family Medicine

## 2021-10-30 ENCOUNTER — Other Ambulatory Visit: Payer: Self-pay

## 2021-10-30 DIAGNOSIS — J984 Other disorders of lung: Secondary | ICD-10-CM

## 2021-11-10 ENCOUNTER — Ambulatory Visit (INDEPENDENT_AMBULATORY_CARE_PROVIDER_SITE_OTHER): Payer: Medicare HMO | Admitting: Physician Assistant

## 2021-11-10 ENCOUNTER — Other Ambulatory Visit: Payer: Self-pay

## 2021-11-10 ENCOUNTER — Ambulatory Visit (HOSPITAL_COMMUNITY)
Admission: RE | Admit: 2021-11-10 | Discharge: 2021-11-10 | Disposition: A | Discharge status: Home / Self Care | Payer: Medicare HMO | Source: Ambulatory Visit | Attending: Physician Assistant | Admitting: Physician Assistant

## 2021-11-10 ENCOUNTER — Ambulatory Visit (INDEPENDENT_AMBULATORY_CARE_PROVIDER_SITE_OTHER)
Admission: RE | Admit: 2021-11-10 | Discharge: 2021-11-10 | Disposition: A | Discharge status: Home / Self Care | Payer: Medicare HMO | Source: Ambulatory Visit | Attending: Physician Assistant | Admitting: Physician Assistant

## 2021-11-10 VITALS — BP 178/88 | HR 72 | Temp 97.7°F | Resp 20 | Ht 69.0 in | Wt 156.0 lb

## 2021-11-10 DIAGNOSIS — I745 Embolism and thrombosis of iliac artery: Secondary | ICD-10-CM | POA: Insufficient documentation

## 2021-11-10 DIAGNOSIS — I739 Peripheral vascular disease, unspecified: Secondary | ICD-10-CM | POA: Insufficient documentation

## 2021-11-10 NOTE — Progress Notes (Signed)
[facility]  HISTORY AND PHYSICAL   History of Present Illness:  Patient is a 70 y.o. year old male who presents  to go over vascular studies related to PAD.  Past surgical history significant for left iliac artery stent in 2018 by Dr. Bridgett Larsson for claudication.  He denies any claudication, rest pain, or nonhealing wounds of bilateral lower extremities.  He is on aspirin and a statin daily.  He denise claudication, rest pain and non healing wounds.  He has CKD stage IV that is being followed by Dr. Justin Mend in Nephrology.  He is not requiring HD at this time.     Past Medical History:  Diagnosis Date   Anxiety    Arthritis    "left knee" (08/05/2016)   Bruises easily    CAD (coronary artery disease) of artery bypass graft 08/05/2016   Around age 64. CABG - 3 vessel.    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 Carles Collet   Former smoker 09/01/2016   Quit 2012. 40 pack years at least.    GERD (gastroesophageal reflux disease)    Headache    History of blood transfusion 08/1997   "when he had his heart surgery"   History of shingles 1970-2013 X 3   HTN (hypertension) 08/05/2016   Amlodipine 5mg , atenolol 50mg  BID, benazepril 20mg    Hyperlipemia    Hypothyroidism    Lumbar disc disease    MGUS (monoclonal gammopathy of unknown significance)    Myocardial infarction ([place]) 08/1997   Peripheral vascular disease (HCC)    PONV (postoperative nausea and vomiting)    Urinary tract infection 09/03/2017   Wears glasses     Past Surgical History:  Procedure Laterality Date   AORTOGRAM Right  08/31/2017   Procedure: AORTOGRAM BILATERAL PELVIC ANGIOGRAM WITH LEFT ILIAC ARTERY STENT;  Surgeon: Conrad Springhill, MD;  Location: [facility];  Service: Vascular;  Laterality: Right;   BACK SURGERY     CARDIAC CATHETERIZATION  08/1997   COLONOSCOPY  2014   CORONARY ARTERY BYPASS GRAFT  09/12/1997   "triple"   ESOPHAGOGASTRODUODENOSCOPY     INGUINAL HERNIA REPAIR Right [place] Left 1960s - 1974 X 4   LAPAROSCOPIC ABLATION RENAL MASS  ~ 2014   "large abscess/pus ball"   [place]   "they were wedged in the bowel"   [place] Left ~ 1965   "removed knee cap"   ROBOT ASSISTED LAPAROSCOPIC NEPHRECTOMY Left 10/07/2017   Procedure: XI ROBOTIC ASSISTED LAPAROSCOPIC NEPHRECTOMY OF PELVIC KIDNEY;  Surgeon: Alexis Frock, MD;  Location: [facility];  Service: Urology;  Laterality: Left;  Place robot patient tower at foot of bed like prostate per Dr. Tresa Moore. Pull extra staple loads.   TONSILLECTOMY      ROS:   General:  No weight loss, Fever, chills  HEENT: No recent headaches, no nasal bleeding, no visual changes, no sore throat hearing loss left ear  Neurologic: No  dizziness, blackouts, seizures. No recent symptoms of stroke or mini- stroke. No recent episodes of slurred speech, or temporary blindness.  Cardiac: No recent episodes of chest pain/pressure, no shortness of breath at rest.  positive shortness of breath with exertion.  Denies history of atrial fibrillation or irregular heartbeat  Vascular: No history of rest pain in feet.  No history of claudication.  No history of non-healing ulcer, No history of DVT   Pulmonary: No home oxygen, no productive cough, no hemoptysis,  No asthma or wheezing  Musculoskeletal:  [ ]  Arthritis, [ ]  Low back pain,  [ ]  Joint pain  Hematologic:No history of hypercoagulable state.  No history of easy bleeding.  No history of anemia  Gastrointestinal: No  hematochezia or melena,  No gastroesophageal reflux, no trouble swallowing  Urinary: [ x] chronic Kidney disease, [ ]  on HD - [ ]  MWF or [ ]  TTHS, [ ]  Burning with urination, [ ]  Frequent urination, [ ]  Difficulty urinating;   Skin: No rashes  Psychological: No history of anxiety,  No history of depression  Social History Social History   Tobacco Use   Smoking status: Former    Packs/day: 1.00    Years: 45.00    Pack years: 45.00    Types: Cigarettes    Quit date: 07/10/2017    Years since quitting: 4.3   Smokeless tobacco: Never  Vaping Use   Vaping Use: Never used  Substance Use Topics   Alcohol use: Yes    Alcohol/week: 14.0 standard drinks    Types: 14 Shots of liquor per week    Comment: daily 2 shots of vodka   Drug use: No    Family History Family History  Problem Relation Age of Onset   Alcohol abuse Mother    Alcohol abuse Father        not involved in life   Alcoholism Sister    Healthy Sister    Healthy Sister    Allergic rhinitis Neg Hx    Angioedema Neg Hx    Asthma Neg Hx    Eczema Neg Hx    Immunodeficiency Neg Hx    Urticaria Neg Hx     Allergies  Allergies  Allergen Reactions   Contrast Media [Iodinated Diagnostic Agents] Other (See Comments)    NOT ALLOWED DUE TO KIDNEY ISSUES.   Other Itching and Rash    Gel used for ultrasound SEVERELY broke out the skin    Keflex [Cephalexin] Diarrhea, Nausea Only and Other (See Comments)    Insomnia   Sulfa Antibiotics Other (See Comments)    Insomnia   Ciprofloxacin Diarrhea   Levaquin [Levofloxacin] Other (See Comments)    Insomnia    Omeprazole Diarrhea     Current Outpatient Medications  Medication Sig Dispense Refill   acetaminophen (TYLENOL) 500 MG tablet Take 500-1,000 mg by mouth every 6 (six) hours as needed (for pain).      albuterol (PROVENTIL) (2.5 MG/3ML) 0.083% nebulizer solution Take 3 mLs (2.5 mg total) by nebulization every 6 (six) hours as needed for wheezing or shortness of  breath. J44.9 300 mL 11   albuterol (VENTOLIN HFA) 108 (90 Base) MCG/ACT inhaler USE 2 INHALATIONS EVERY 4 HOURS AS NEEDED 25.5 g 2   aspirin EC 81 MG tablet Take 81 mg by mouth daily.     atenolol (TENORMIN) 50 MG tablet TAKE 1 TABLET DAILY 90 tablet 3   budesonide-formoterol (SYMBICORT) 160-4.5 MCG/ACT inhaler USE 2 INHALATIONS TWICE A DAY 30.6  g 3   desonide (DESOWEN) 0.05 % cream Apply topically 2 (two) times daily. 7-10 days as needed for eczema 30 g 5   levothyroxine (SYNTHROID) 112 MCG tablet TAKE 1 TABLET DAILY BEFORE BREAKFAST 90 tablet 3   mirtazapine (REMERON) 15 MG tablet Take 1 tablet (15 mg total) by mouth at bedtime. 90 tablet 3   rosuvastatin (CRESTOR) 40 MG tablet TAKE 1 TABLET DAILY (NEED APPOINTMENT) 90 tablet 3   Tiotropium Bromide Monohydrate (SPIRIVA RESPIMAT) 2.5 MCG/ACT AERS Inhale 2 puffs into the lungs daily. 12 g 2   traMADol (ULTRAM) 50 MG tablet TAKE 1 TABLET (50 MG TOTAL) BY MOUTH 2 (TWO) TIMES DAILY AS NEEDED FOR MODERATE PAIN OR SEVERE PAIN. 60 tablet 5   No current facility-administered medications for this visit.    Physical Examination  Vitals:   11/10/21 0933  BP: (!) 178/88  Pulse: 72  Resp: 20  Temp: 97.7 F (36.5 C)  SpO2: 97%  Weight: 156 lb (70.8 kg)  Height: 5\' 9"  (1.753 m)    Body mass index is 23.04 kg/m.  General:  Alert and oriented, no acute distress HEENT: Normal Neck: No bruit or JVD Pulmonary: Clear to auscultation bilaterally Cardiac: Regular Rate and Rhythm without murmur Abdomen: Soft, non-tender, non-distended, no mass, no scars Skin: No rash Extremity Pulses:  2+ radial, brachial, femoral, dorsalis pedis,  pulses bilaterally Musculoskeletal: No deformity or edema  Neurologic: Upper and lower extremity motor 5/5 and symmetric  DATA:      Abdominal Aorta Findings:  +-----------+-------+----------+----------+--------+--------+--------+   Location    AP (cm) Trans (cm) PSV (cm/s) Waveform Thrombus Comments    +-----------+-------+----------+----------+--------+--------+--------+   Proximal                       83                                      +-----------+-------+----------+----------+--------+--------+--------+   Mid                            97                                      +-----------+-------+----------+----------+--------+--------+--------+   Distal                         72                                      +-----------+-------+----------+----------+--------+--------+--------+   RT CIA Prox                    124                                     +-----------+-------+----------+----------+--------+--------+--------+     Left Stent(s):  +-------------------+--------+--------+--------+--------------------+   Common iliac artery PSV cm/s Stenosis Waveform Comments               +-------------------+--------+--------+--------+--------------------+   Prox to Stent       72                                                +-------------------+--------+--------+--------+--------------------+  Proximal Stent      112                                               +-------------------+--------+--------+--------+--------------------+   Mid Stent           138                                               +-------------------+--------+--------+--------+--------------------+   Distal Stent        123                        Limited by bowel gas   +-------------------+--------+--------+--------+--------------------+   Distal to Stent     123                                               +-------------------+--------+--------+--------+--------------------+    Summary:  Abdominal Aorta: Technically difficult exam due to overlying bowel gas.  Left common iliac artery stent appears patent.       +-------+-----------+-----------+------------+------------+   ABI/TBI Today's ABI Today's TBI Previous ABI Previous TBI   +-------+-----------+-----------+------------+------------+    Right   1.15        0.86        1.07         0.83           +-------+-----------+-----------+------------+------------+   Left    1.11        0.79        1.07         0.71           +-------+-----------+-----------+------------+------------+  ASSESSMENT:/PLAN:  PAD with history of left Iliac stent placement He remains asymptomatic for ischemia since his procedure.   He will f/u in 1 year for continued surveillance of PAD Continue aspirin and statin daily     Roxy Horseman PA-C [facility] [place] Office: [phone]  MD om call Scot Dock

## 2021-11-15 ENCOUNTER — Other Ambulatory Visit: Payer: Self-pay | Admitting: Family Medicine

## 2021-11-17 ENCOUNTER — Other Ambulatory Visit: Payer: Self-pay | Admitting: Family Medicine

## 2021-11-17 ENCOUNTER — Encounter: Payer: Self-pay | Admitting: Family Medicine

## 2021-11-17 MED ORDER — TRAMADOL HCL 50 MG PO TABS: ORAL_TABLET | ORAL | 0 refills | Status: DC

## 2021-11-18 NOTE — Progress Notes (Signed)
Synopsis: Referred for COPD, lung cancer screening by [name], MD  Subjective:   PATIENT ID: Brian Graham GENDER: male DOB: 09/06/1951, MRN: [id number]  Chief Complaint  Patient presents with   Follow-up    Former pt of Dr Lake Bells. He states his breathing has improved some since his last visit here. He states he has had lingering cough- non prod and frequent need to clear his throat since Aug 2022. He is using his albuterol inhaler 2 x daily on average.    70yM with history of COPD on symbicort  160 and spiriva, pulmonary nodules, CAD s/p CABG, CKD, ET, GERD, MGUS, 45 py smoker quit 2018, covid-19 infection 06/2021, last seen in our clinic 07/2021 at which point  Had nodular density on CXR 07/2021 posst covid infection and was re-referred to lung cancer screening program.   Continued on inhalers and antihistamine started for PND, saline nasal spray  Adherent to inhalers. No trips to the hospital since last visit. Doesn't think he had steroid or ABX course this year. Still a little more dyspneic than normal after covid-19. He does still have post-nasal drainage. Does not use flonase. Not using an antihistamine.   No family history of lung cancer.   Otherwise pertinent review of systems is negative.  Past Medical History:  Diagnosis Date   Anxiety    Arthritis    "left knee" (08/05/2016)   Bruises easily    CAD (coronary artery disease) of artery bypass graft 08/05/2016   Around age 14. CABG - 3 vessel.    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 Carles Collet   Former smoker 09/01/2016   Quit 2012.  40 pack years at least.    GERD (gastroesophageal reflux disease)    Headache    History of blood transfusion 08/1997   "when he had his heart surgery"   History of shingles 1970-2013 X 3   HTN (hypertension) 08/05/2016   Amlodipine 5mg , atenolol 50mg  BID, benazepril 20mg    Hyperlipemia    Hypothyroidism    Lumbar disc disease    MGUS (monoclonal gammopathy of unknown significance)    Myocardial infarction ([place]) 08/1997   Peripheral vascular disease (HCC)    PONV (postoperative nausea and vomiting)    Urinary tract infection 09/03/2017   Wears glasses      Family History  Problem Relation Age of Onset   Alcohol abuse Mother    Alcohol abuse Father        not involved in life   Alcoholism Sister    Healthy Sister    Healthy Sister    Allergic rhinitis Neg Hx    Angioedema Neg Hx    Asthma Neg Hx    Eczema Neg Hx    Immunodeficiency Neg Hx    Urticaria Neg Hx      Past Surgical History:  Procedure Laterality Date   AORTOGRAM Right 08/31/2017   Procedure: AORTOGRAM BILATERAL PELVIC ANGIOGRAM WITH LEFT ILIAC ARTERY STENT;  Surgeon: Conrad Midlothian, MD;  Location: [facility];  Service: Vascular;  Laterality: Right;  BACK SURGERY     CARDIAC CATHETERIZATION  08/1997   COLONOSCOPY  2014   CORONARY ARTERY BYPASS GRAFT  09/12/1997   "triple"   ESOPHAGOGASTRODUODENOSCOPY     INGUINAL HERNIA REPAIR Right 1961   KIDNEY REMOVED     KNEE RECONSTRUCTION Left 1960s - 1974 X 4   LAPAROSCOPIC ABLATION RENAL MASS  ~ 2014   "large abscess/pus ball"   [place]   "they were wedged in the bowel"   [place] Left ~ 1965   "removed knee cap"   ROBOT ASSISTED LAPAROSCOPIC NEPHRECTOMY Left 10/07/2017   Procedure: XI ROBOTIC ASSISTED LAPAROSCOPIC NEPHRECTOMY OF PELVIC KIDNEY;  Surgeon: Alexis Frock, MD;  Location: [facility];  Service: Urology;  Laterality: Left;  Place robot patient tower at foot of bed like prostate per Dr.  Tresa Moore. Pull extra staple loads.   TONSILLECTOMY      Social History   Socioeconomic History   Marital status: Married    Spouse name: Not on file   Number of children: 2   Years of education: 12   Highest education level: Not on file  Occupational History   Occupation: Retired   Tobacco Use   Smoking status: Former    Packs/day: 1.00    Years: 45.00    Pack years: 45.00    Types: Cigarettes    Quit date: 07/10/2017    Years since quitting: 4.3   Smokeless tobacco: Never  Vaping Use   Vaping Use: Never used  Substance and Sexual Activity   Alcohol use: Yes    Alcohol/week: 14.0 standard drinks    Types: 14 Shots of liquor per week    Comment: daily 2 shots of vodka   Drug use: No   Sexual activity: Not Currently  Other Topics Concern   Not on file  Social History Narrative   Lives with wife. 2 adopted daughters from [place].       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   Other Itching and Rash    Gel used for ultrasound SEVERELY broke out the skin    Keflex [Cephalexin] Diarrhea, Nausea Only and Other (See Comments)    Insomnia   Sulfa Antibiotics Other (See Comments)    Insomnia   Ciprofloxacin Diarrhea   Levaquin [Levofloxacin] Other (See Comments)    Insomnia    Omeprazole Diarrhea     Outpatient Medications Prior to Visit  Medication Sig Dispense Refill   acetaminophen (TYLENOL) 500 MG tablet Take 500-1,000 mg  by mouth every 6 (six) hours as needed (for pain).      albuterol (PROVENTIL) (2.5 MG/3ML) 0.083% nebulizer solution Take 3 mLs (2.5 mg total) by nebulization every 6 (six) hours as needed for wheezing or shortness of breath. J44.9 300 mL  11   albuterol (VENTOLIN HFA) 108 (90 Base) MCG/ACT inhaler USE 2 INHALATIONS EVERY 4 HOURS AS NEEDED 25.5 g 2   aspirin EC 81 MG tablet Take 81 mg by mouth daily.     atenolol (TENORMIN) 50 MG tablet TAKE 1 TABLET DAILY 90 tablet 3   budesonide-formoterol (SYMBICORT) 160-4.5 MCG/ACT inhaler USE 2 INHALATIONS TWICE A DAY 30.6 g 3   desonide (DESOWEN) 0.05 % cream Apply topically 2 (two) times daily. 7-10 days as needed for eczema 30 g 5   levothyroxine (SYNTHROID) 112 MCG tablet TAKE 1 TABLET DAILY BEFORE BREAKFAST 90 tablet 3   mirtazapine (REMERON) 15 MG tablet Take 1 tablet (15 mg total) by mouth at bedtime. 90 tablet 3   rosuvastatin (CRESTOR) 40 MG tablet TAKE 1 TABLET DAILY (NEED APPOINTMENT) 90 tablet 3   Tiotropium Bromide Monohydrate (SPIRIVA RESPIMAT) 2.5 MCG/ACT AERS Inhale 2 puffs into the lungs daily. 12 g 2   traMADol (ULTRAM) 50 MG tablet TAKE 1 TABLET (50 MG TOTAL) BY MOUTH 2 (TWO) TIMES DAILY AS NEEDED FOR MODERATE PAIN OR SEVERE PAIN. 60 tablet 0   No facility-administered medications prior to visit.       Objective:   Physical Exam:  General appearance: 70 y.o., male, NAD, conversant  Eyes: anicteric sclerae; PERRL, tracking appropriately HENT: NCAT; MMM Neck: Trachea midline; no lymphadenopathy, no JVD Lungs: Diminished bilaterally, no crackles, no wheeze, with normal respiratory effort CV: RRR, no murmur  Abdomen: Soft, non-tender; non-distended, BS present  Extremities: No peripheral edema, warm Skin: Normal turgor and texture; no rash Psych: Appropriate affect Neuro: Alert and oriented to person and place, no focal deficit     Vitals:   11/19/21 0849  BP: (!) 144/74  Pulse: 74  Temp: 97.6 F (36.4 C)  TempSrc: Oral   SpO2: 96%  Weight: 154 lb (69.9 kg)  Height: 5\' 8"  (1.727 m)   96% on RA BMI Readings from Last 3 Encounters:  11/19/21 23.42 kg/m  11/10/21 23.04 kg/m  10/28/21 22.80 kg/m   Wt Readings from Last 3 Encounters:  11/19/21 154 lb (69.9 kg)  11/10/21 156 lb (70.8 kg)  10/28/21 154 lb 6.4 oz (70 kg)     CBC    Component Value Date/Time   WBC 8.0 07/14/2021 1626   RBC 4.34 07/14/2021 1626   HGB 12.5 (L) 07/14/2021 1626   HGB 10.2 (L) 02/14/2020 0833   HCT 38.9 (L) 07/14/2021 1626   PLT 349 07/14/2021 1626   PLT 217 02/14/2020 0833   MCV 89.6 07/14/2021 1626   MCH 28.8 07/14/2021 1626   MCHC 32.1 07/14/2021 1626   RDW 15.4 07/14/2021 1626   LYMPHSABS 0.8 07/14/2021 1626   MONOABS 1.3 (H) 07/14/2021 1626   EOSABS 0.0 07/14/2021 1626   BASOSABS 0.0 07/14/2021 1626     Chest Imaging:  CXR 10/30/21 reviewed by me - agree can't clearly see nodular opacity noted on prior exam  CT Chest 09/2019 reviewed by me remarkable for stable to decreased size of nodules,  04/2021 normal swallow  MRI c-spine 12/2020 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  Echocardiogram:   Stress echo 2014 EF 55%, normal exercise response      Assessment & Plan:   # COPD gold functional group B  # Pulmonary nodule vs scar  # Post-nasal drainage  Plan: - lung cancer screening re-referral placed today - symbicort 2 puffs BID with spacer rinsing mouth  afterward, spiriva 2 puffs daily - instructed to take his shower 20-30 min after morning or evening symbicort dose as humidity typically causes him some increased dyspnea - up to date with flu, covid - ppsv23 at some point between 2022 and 2027 (has already had PCV13 and ppsv23 2017)      Maryjane Hurter, MD [facility] 11/19/2021 9:25 AM

## 2021-11-19 ENCOUNTER — Other Ambulatory Visit: Payer: Self-pay

## 2021-11-19 ENCOUNTER — Encounter: Payer: Self-pay | Admitting: Student

## 2021-11-19 ENCOUNTER — Ambulatory Visit: Payer: Medicare HMO | Admitting: Student

## 2021-11-19 VITALS — BP 144/74 | HR 74 | Temp 97.6°F | Ht 68.0 in | Wt 154.0 lb

## 2021-11-19 DIAGNOSIS — J449 Chronic obstructive pulmonary disease, unspecified: Secondary | ICD-10-CM | POA: Diagnosis not present

## 2021-11-19 DIAGNOSIS — R053 Chronic cough: Secondary | ICD-10-CM | POA: Diagnosis not present

## 2021-11-19 DIAGNOSIS — F17200 Nicotine dependence, unspecified, uncomplicated: Secondary | ICD-10-CM

## 2021-11-19 MED ORDER — FLUTICASONE PROPIONATE 50 MCG/ACT NA SUSP: 1.0000 | Freq: Every day | NASAL | 3 refills | Status: DC

## 2021-11-19 NOTE — Patient Instructions (Addendum)
-   symbicort 20-30 minutes before shower or albuterol if your shower is in the afternoon - shower, clear your nose out and then flonase daily for at least 3 weeks to see a difference in nasal drainage - referral for lung cancer screening - see you in 6 months+

## 2021-12-01 LAB — BASIC METABOLIC PANEL
BUN: 28 — AB (ref 4–21)
CO2: 27 — AB (ref 13–22)
Chloride: 101 (ref 99–108)
Creatinine: 2.6 — AB (ref 0.6–1.3)
Glucose: 111
Potassium: 4.5 (ref 3.4–5.3)
Sodium: 139 (ref 137–147)

## 2021-12-01 LAB — COMPREHENSIVE METABOLIC PANEL
Albumin: 4.3 (ref 3.5–5.0)
Calcium: 9.4 (ref 8.7–10.7)

## 2021-12-18 ENCOUNTER — Encounter: Payer: Self-pay | Admitting: Family Medicine

## 2021-12-20 MED ORDER — TRAMADOL HCL 50 MG PO TABS: ORAL_TABLET | ORAL | 5 refills | Status: DC

## 2022-01-08 ENCOUNTER — Other Ambulatory Visit: Payer: Self-pay | Admitting: *Deleted

## 2022-01-08 DIAGNOSIS — Z87891 Personal history of nicotine dependence: Secondary | ICD-10-CM

## 2022-01-13 LAB — BASIC METABOLIC PANEL
BUN: 32 — AB (ref 4–21)
CO2: 25 — AB (ref 13–22)
Chloride: 103 (ref 99–108)
Creatinine: 2.8 — AB (ref 0.6–1.3)
Glucose: 89
Potassium: 4.5 (ref 3.4–5.3)
Sodium: 141 (ref 137–147)

## 2022-01-13 LAB — COMPREHENSIVE METABOLIC PANEL
Albumin: 4.4 (ref 3.5–5.0)
Calcium: 8.9 (ref 8.7–10.7)

## 2022-01-13 LAB — CBC AND DIFFERENTIAL: Hemoglobin: 11.8 — AB (ref 13.5–17.5)

## 2022-01-19 ENCOUNTER — Ambulatory Visit (INDEPENDENT_AMBULATORY_CARE_PROVIDER_SITE_OTHER)
Admission: RE | Admit: 2022-01-19 | Discharge: 2022-01-19 | Disposition: A | Discharge status: Home / Self Care | Payer: Medicare HMO | Source: Ambulatory Visit | Attending: Acute Care | Admitting: Acute Care

## 2022-01-19 ENCOUNTER — Other Ambulatory Visit: Payer: Self-pay

## 2022-01-19 DIAGNOSIS — Z87891 Personal history of nicotine dependence: Secondary | ICD-10-CM

## 2022-01-21 ENCOUNTER — Other Ambulatory Visit: Payer: Self-pay | Admitting: Acute Care

## 2022-01-21 DIAGNOSIS — Z87891 Personal history of nicotine dependence: Secondary | ICD-10-CM

## 2022-01-27 ENCOUNTER — Other Ambulatory Visit: Payer: Self-pay | Admitting: Family Medicine

## 2022-02-10 ENCOUNTER — Encounter: Payer: Self-pay | Admitting: Family Medicine

## 2022-02-11 ENCOUNTER — Encounter: Payer: Self-pay | Admitting: Family Medicine

## 2022-02-11 ENCOUNTER — Ambulatory Visit (INDEPENDENT_AMBULATORY_CARE_PROVIDER_SITE_OTHER): Payer: Medicare HMO | Admitting: Family Medicine

## 2022-02-11 VITALS — BP 128/72 | HR 71 | Temp 98.4°F | Ht 68.0 in | Wt 161.6 lb

## 2022-02-11 DIAGNOSIS — J449 Chronic obstructive pulmonary disease, unspecified: Secondary | ICD-10-CM

## 2022-02-11 DIAGNOSIS — J441 Chronic obstructive pulmonary disease with (acute) exacerbation: Secondary | ICD-10-CM | POA: Diagnosis not present

## 2022-02-11 DIAGNOSIS — I10 Essential (primary) hypertension: Secondary | ICD-10-CM | POA: Diagnosis not present

## 2022-02-11 MED ORDER — FLUTICASONE PROPIONATE 50 MCG/ACT NA SUSP: 2.0000 | Freq: Every day | NASAL | 3 refills | Status: DC

## 2022-02-11 MED ORDER — PREDNISONE 20 MG PO TABS: 40.0000 mg | ORAL_TABLET | Freq: Every day | ORAL | 0 refills | Status: DC

## 2022-02-11 MED ORDER — AMOXICILLIN-POT CLAVULANATE 500-125 MG PO TABS: 1.0000 | ORAL_TABLET | Freq: Two times a day (BID) | ORAL | 0 refills | Status: DC

## 2022-02-11 NOTE — Progress Notes (Signed)
?Phone [phone] ?In person visit ?  ?Subjective:  ? ?Brian Graham is a 71 y.o. year old very pleasant male patient who presents for/with See problem oriented charting ?Chief Complaint  ?Patient presents with  ? Cough  ?  Pt c/o cough and nasal congestion with clear/yellow and green mucous x3 days. Negative COVID test yesterday.   ? ? ?This visit occurred during the SARS-CoV-2 public health emergency.  Safety protocols were in place, including screening questions prior to the visit, additional usage of staff PPE, and extensive cleaning of exam room while observing appropriate contact time as indicated for disinfecting solutions.  ? ?Past Medical History-  ?Patient Active Problem List  ? Diagnosis Date Noted  ? Chronic kidney disease (CKD), stage IV (severe) ([place]) 08/13/2019  ?  Priority: High  ? S/p nephrectomy 12/05/2018  ?  Priority: High  ? MGUS (monoclonal gammopathy of unknown significance) 08/02/2018  ?  Priority: High  ? Osteoporosis 06/26/2018  ?  Priority: High  ? Atherosclerosis of native arteries of extremity with intermittent claudication ([place]) 07/06/2017  ?  Priority: High  ? Weight loss 04/23/2017  ?  Priority: High  ? Former smoker 09/01/2016  ?  Priority: High  ? COPD, severe ([place]) 08/23/2016  ?  Priority: High  ? Hx of CABG 08/05/2016  ?  Priority: High  ? CAD (coronary artery disease) of artery bypass graft 08/05/2016  ?  Priority: High  ? Vitamin D deficiency 01/20/2021  ?  Priority: Medium   ? Vitamin B12 deficiency 01/20/2021  ?  Priority: Medium   ? 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  Priority: Medium   ? History of adenomatous polyp of colon 04/22/2017  ?  Priority: Medium   ? Hyperlipidemia 04/04/2017  ?  Priority: Medium   ? Hypothyroidism (acquired) 04/04/2017  ?  Priority: Medium   ? Essential hypertension 08/05/2016  ?  Priority: Medium   ? Essential tremor 06/11/2016  ?  Priority: Medium   ? Perennial allergic rhinitis 01/24/2018  ?  Priority: Low  ? Anxiety  07/24/2017  ?  Priority: Low  ? Pyelonephritis 04/23/2017  ?  Priority: Low  ? Aortic atherosclerosis ([place]) 04/13/2017  ?  Priority: Low  ? Seasonal allergies 04/04/2017  ?  Priority: Low  ? Myalgia 10/04/2016  ?  Priority: Low  ? Dysuria 08/23/2016  ?  Priority: Low  ? Chronic pain of both knees 08/23/2016  ?  Priority: Low  ? Tension headache 06/11/2016  ?  Priority: Low  ? Hearing abnormally acute, left 04/03/2020  ? Healthcare maintenance 04/03/2020  ? Constipation 09/03/2017  ? ? ?Medications- reviewed and updated ?Current Outpatient Medications  ?Medication Sig Dispense Refill  ? acetaminophen (TYLENOL) 500 MG tablet Take 500-1,000 mg by mouth every 6 (six) hours as needed (for pain).     ? albuterol (PROVENTIL) (2.5 MG/3ML) 0.083% nebulizer solution Take 3 mLs (2.5 mg total) by nebulization every 6 (six) hours as needed for wheezing or shortness of breath. J44.9 300 mL 11  ? albuterol (VENTOLIN HFA) 108 (90 Base) MCG/ACT inhaler USE 2 INHALATIONS EVERY 4 HOURS AS NEEDED 25.5 g 2  ? amoxicillin-clavulanate (AUGMENTIN) 500-125 MG tablet Take 1 tablet (500 mg total) by mouth in the morning and at bedtime. 14 tablet 0  ? aspirin EC 81 MG tablet Take 81 mg by mouth daily.    ? atenolol (TENORMIN) 50 MG tablet TAKE 1 TABLET DAILY 90 tablet 3  ? budesonide-formoterol (SYMBICORT) 160-4.5 MCG/ACT  inhaler USE 2 INHALATIONS TWICE A DAY 30.6 g 3  ? desonide (DESOWEN) 0.05 % cream Apply topically 2 (two) times daily. 7-10 days as needed for eczema 30 g 5  ? levothyroxine (SYNTHROID) 112 MCG tablet TAKE 1 TABLET DAILY BEFORE BREAKFAST 90 tablet 3  ? mirtazapine (REMERON) 15 MG tablet Take 1 tablet (15 mg total) by mouth at bedtime. 90 tablet 3  ? predniSONE (DELTASONE) 20 MG tablet Take 2 tablets (40 mg total) by mouth daily with breakfast. 10 tablet 0  ? rosuvastatin (CRESTOR) 40 MG tablet TAKE 1 TABLET DAILY (NEED APPOINTMENT) 90 tablet 3  ? Tiotropium Bromide Monohydrate (SPIRIVA RESPIMAT) 2.5 MCG/ACT AERS Inhale 2  puffs into the lungs daily. 12 g 2  ? traMADol (ULTRAM) 50 MG tablet TAKE 1 TABLET (50 MG TOTAL) BY MOUTH 2 (TWO) TIMES DAILY AS NEEDED FOR MODERATE PAIN OR SEVERE PAIN. 60 tablet 5  ? fluticasone (FLONASE) 50 MCG/ACT nasal spray Place 2 sprays into both nostrils daily. 48 g 3  ? ?No current facility-administered medications for this visit.  ? ?  ?Objective:  ?BP 128/72   Pulse 71   Temp 98.4 ?F (36.9 ?C)   Ht '5\' 8"'$  (1.727 m)   Wt 161 lb 9.6 oz (73.3 kg)   SpO2 95%   BMI 24.57 kg/m?  ?Gen: NAD, resting comfortably ?TM normal, pharynx with mild drainage, maxillary sinus tenderness, turbinates with clear and yellow discharge ?CV: RRR no murmurs rubs or gallops ?Lungs: CTAB no crackles, wheeze, rhonchi- nebulizer before bed last night ?Ext: no edema ?Skin: warm, dry ? ?  ? ?Assessment and Plan  ? ?#Cough/nasal congestion and patient with severe COPD ?S: Patient with cough and nasal congestion for at least 3 days ago- Sunday night. Doesn't feel like flu to him. Cough has picked up from baseline. Has felt run down.  ?Large volume drainage- He has had a mixture of clear sinus discharge along with yellow and green discharge. Increased sputum production and change in sputum color. Has had large volume of sputum. Knees bother him more when ill. Some mild chest tightness diffuse but no SOB- nebulizer  with albuteroldid help with cough. Has had some headache. No sore throat or fever.  ?- still using symbicort and spiriva daily.  Also having wheezing ?-   Negative COVID test yesterday.  Had covid in the summer ?-history of sinus infections and this feels similar ?A/P: Appears to have at least mild COPD flare with increased sputum production and change in sputum color and increasing wheeze. ?- Treat with prednisone and Augmentin ?Adjust Augmentin down due to CKD iV_ GFR in 20s ?-advised one more covid test tomorrow morning  ?-also does better with flonase rx- will resend this in and can restart more for prevention of  future recurrence than treatment ? ?# Hypertension ?S: compliant atenolol '50mg'$ .  ?-off amlodipine- possibly by nephrology ?-Off benazepril '20mg'$  due to renal function ? ?Blood pressure was initially very elevated today but came down significantly on repeat ?  ?BP Readings from Last 3 Encounters:  ?02/11/22 (!) 176/82--> 128/72  ?11/19/21 (!) 144/74  ?11/10/21 (!) 178/88  ? A/P: Controlled on repeat. Continue current medications.  ? ?Recommended follow up: Return for as needed for new, worsening, persistent symptoms. ?Future Appointments  ?Date Time Provider Hydro  ?05/03/2022  9:40 AM [name], Brayton Mars, MD [facility]  ? ? ?Lab/Order associations: ?  ICD-10-CM   ?1. COPD exacerbation ([place])  J44.1   ?  ?2. COPD,  severe ([place])  J44.9   ?  ?3. Essential hypertension  I10   ?  ? ? ?Meds ordered this encounter  ?Medications  ? amoxicillin-clavulanate (AUGMENTIN) 500-125 MG tablet  ?  Sig: Take 1 tablet (500 mg total) by mouth in the morning and at bedtime.  ?  Dispense:  14 tablet  ?  Refill:  0  ? predniSONE (DELTASONE) 20 MG tablet  ?  Sig: Take 2 tablets (40 mg total) by mouth daily with breakfast.  ?  Dispense:  10 tablet  ?  Refill:  0  ? fluticasone (FLONASE) 50 MCG/ACT nasal spray  ?  Sig: Place 2 sprays into both nostrils daily.  ?  Dispense:  48 g  ?  Refill:  3  ? ? ?Return precautions advised.  ?Garret Reddish, MD ? ?

## 2022-02-11 NOTE — Patient Instructions (Addendum)
advised one more covid test tomorrow morning  ? ?Start augmentin today- 7 days ? ?Start prednisone tomorrow morning- 5 days ? ?I think you have a mild COPD flare triggered by sinus infection- these treatments should cover both issues ? ?Recommended follow up: Return for as needed for new, worsening, persistent symptoms. ?- let us know if not better by Monday- seek care if worsens over weekend ?

## 2022-02-12 ENCOUNTER — Encounter: Payer: Self-pay | Admitting: Family Medicine

## 2022-02-16 ENCOUNTER — Encounter: Payer: Self-pay | Admitting: Family Medicine

## 2022-02-20 ENCOUNTER — Other Ambulatory Visit: Payer: Self-pay | Admitting: Family Medicine

## 2022-03-17 ENCOUNTER — Encounter: Payer: Self-pay | Admitting: Family Medicine

## 2022-03-17 NOTE — Telephone Encounter (Signed)
First available is 5/5. Pt is asking if [name] can work him in any sooner. ?

## 2022-03-22 ENCOUNTER — Ambulatory Visit (INDEPENDENT_AMBULATORY_CARE_PROVIDER_SITE_OTHER): Payer: Medicare HMO | Admitting: Family Medicine

## 2022-03-22 ENCOUNTER — Encounter: Payer: Self-pay | Admitting: Family Medicine

## 2022-03-22 VITALS — BP 122/60 | HR 71 | Temp 97.3°F | Ht 68.0 in | Wt 161.2 lb

## 2022-03-22 DIAGNOSIS — I10 Essential (primary) hypertension: Secondary | ICD-10-CM | POA: Diagnosis not present

## 2022-03-22 DIAGNOSIS — E039 Hypothyroidism, unspecified: Secondary | ICD-10-CM | POA: Diagnosis not present

## 2022-03-22 DIAGNOSIS — R519 Headache, unspecified: Secondary | ICD-10-CM

## 2022-03-22 DIAGNOSIS — R1110 Vomiting, unspecified: Secondary | ICD-10-CM | POA: Diagnosis not present

## 2022-03-22 NOTE — Patient Instructions (Addendum)
-   he wants to trial off spiriva and flonase- I am not super confident this will resolve issue but short term trial is reasonable ? ?- you opted out of repeat MRI for now ? ?-could try neil med sinus rinse before bed or twice daily to see if that helps ? ?- consider follow up with Dr. Carlean Purl if not improving with these efforts ? ?Recommended follow up: Return for next already scheduled visit or sooner if needed. ?

## 2022-03-22 NOTE — Progress Notes (Signed)
Phone [phone] In person visit   Subjective:   Brian Graham is a 71 y.o. year old very pleasant male patient who presents for/with See problem oriented charting Chief Complaint  Patient presents with   Headache    Pt c/o bad headaches that have been going on for over a month.     Past Medical History-  Patient Active Problem List   Diagnosis Date Noted   Chronic kidney disease (CKD), stage IV (severe) ([facility]) 08/13/2019    Priority: High   S/p nephrectomy 12/05/2018    Priority: High   MGUS (monoclonal gammopathy of unknown significance) 08/02/2018    Priority: High   Osteoporosis 06/26/2018    Priority: High   Atherosclerosis of native arteries of extremity with intermittent claudication ([facility]CC) 07/06/2017    Priority: High   Weight loss 04/23/2017    Priority: High   Former smoker 09/01/2016    Priority: High   COPD, 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    Priority: High   Hx of CABG 08/05/2016    Priority: High   CAD (coronary artery disease) of artery bypass graft 08/05/2016    Priority: High   Vitamin D deficiency 01/20/2021    Priority: Medium    Vitamin B12 deficiency 01/20/2021    Priority: Medium    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    Priority: Medium    History of adenomatous polyp of colon 04/22/2017    Priority: Medium    Hyperlipidemia 04/04/2017    Priority: Medium    Hypothyroidism (acquired) 04/04/2017    Priority: Medium    Essential hypertension 08/05/2016    Priority: Medium    Essential tremor 06/11/2016    Priority: Medium    Perennial allergic rhinitis 01/24/2018    Priority: Low   Anxiety 07/24/2017    Priority: Low   Pyelonephritis 04/23/2017    Priority: Low   Aortic atherosclerosis ([facility]CC) 04/13/2017    Priority: Low   Seasonal allergies 04/04/2017    Priority: Low   Myalgia 10/04/2016    Priority: Low   Dysuria 08/23/2016    Priority: Low   Chronic pain of both knees 08/23/2016    Priority: Low   Tension headache  06/11/2016    Priority: Low   Hearing abnormally acute, left 04/03/2020   Healthcare maintenance 04/03/2020   Constipation 09/03/2017    Medications- reviewed and updated Current Outpatient Medications  Medication Sig Dispense Refill   acetaminophen (TYLENOL) 500 MG tablet Take 500-1,000 mg by mouth every 6 (six) hours as needed (for pain).      albuterol (PROVENTIL) (2.5 MG/3ML) 0.083% nebulizer solution Take 3 mLs (2.5 mg total) by nebulization every 6 (six) hours as needed for wheezing or shortness of breath. J44.9 300 mL 11   albuterol (VENTOLIN HFA) 108 (90 Base) MCG/ACT inhaler USE 2 INHALATIONS EVERY 4 HOURS AS NEEDED 25.5 g 2   aspirin EC 81 MG tablet Take 81 mg by mouth daily.     atenolol (TENORMIN) 50 MG tablet TAKE 1 TABLET DAILY 90 tablet 3   budesonide-formoterol (SYMBICORT) 160-4.5 MCG/ACT inhaler USE 2 INHALATIONS TWICE A DAY 30.6 g 3   desonide (DESOWEN) 0.05 % cream Apply topically 2 (two) times daily. 7-10 days as needed for eczema 30 g 5   fluticasone (FLONASE) 50 MCG/ACT nasal spray Place 2 sprays into both nostrils daily. 48 g 3   levothyroxine (SYNTHROID) 112 MCG tablet TAKE 1 TABLET DAILY BEFORE BREAKFAST 90 tablet 3   mirtazapine (REMERON) 15 MG tablet  Take 1 tablet (15 mg total) by mouth at bedtime. 90 tablet 3   rosuvastatin (CRESTOR) 40 MG tablet TAKE 1 TABLET DAILY (NEED APPOINTMENT) 90 tablet 3   Tiotropium Bromide Monohydrate (SPIRIVA RESPIMAT) 2.5 MCG/ACT AERS Inhale 2 puffs into the lungs daily. 12 g 2   traMADol (ULTRAM) 50 MG tablet TAKE 1 TABLET (50 MG TOTAL) BY MOUTH 2 (TWO) TIMES DAILY AS NEEDED FOR MODERATE PAIN OR SEVERE PAIN. 60 tablet 5   No current facility-administered medications for this visit.     Objective:  BP 122/60   Pulse 71   Temp (!) 97.3 F (36.3 C)   Ht 5\' 8"  (1.727 m)   Wt 161 lb 3.2 oz (73.1 kg)   SpO2 92%   BMI 24.51 kg/m  Gen: NAD, resting comfortably CV: RRR no murmurs rubs or gallops Lungs: CTAB no crackles, wheeze,  rhonchi Abdomen: soft/nontender/nondistended/normal bowel sounds. No rebound or guarding. Upper abdominal scars noted- mild reducible hernia in this area Ext: no edema Skin: warm, dry Neuro: CN II-XII intact, sensation and reflexes normal throughout, 5/5 muscle strength in bilateral upper and lower extremities. Normal finger to nose. Normal rapid alternating movements. No pronator drift. Normal romberg. Normal gait.      Assessment and Plan   #Headache and dry heaves S: Patient has been having headaches can be rather significant-he rates them as up to 10/10- usually last 4 am to 10 Am with tylenol's help to [facility]- taking daily for a month now.  Pain is in back right side of head. Symptoms started over a month ago. Atypical for him to get headaches in adult life though he has some in the past with dry heaves.   He also has been having dry heaves every morning and can be rather violent and have lasted up to 40 minutes.  These tend to be triggered by significant drainage in the morning-he feels like he is choking on what he is coughing up. Large amount of phlegm in the morning. He has been trying flonase at night but this last night he did not take it- and felt better this Am- less congestion. Mild dry heaves this morning. Took spiriva later in morning and felt some more congestion. Takes symbicort as well. Albuterol has not helped. Has been on spiriva for 10-11 years. Fan in the face helps some. Feels this was going on even prior to last visit- no improvement with antibiotic augmentin last visit and didn't take prednisone because felt breathing was ok.  In the past felt like dry heaves were 10 am every morning- this has been more extended. Ondansetron and motion sickness bands dont help. Feels more fatigued and sits in chair most of the day. Some loose stools last few days. Appetite lower- increased nausea with beef and pork recently- lower protein  Has had more nasal congestion in the morning- green  thick phlegm- but once clears doesn't note much during the day.   No facial or extremity weakness. No slurred words or trouble swallowing (other than if there eis a lot of phlegm). no blurry vision or double vision. No paresthesias. No confusion or word finding difficulties.   Just had labs with [facility] on 02/25/22 while this issue was ongoing renal function panel stable and anemia stable- opts out of labs  Last swallow study interpretation from Dr. Leone Payor 05/12/21 "This test indicates that it is tight at the top of the esophagus where they refer to a prominent cricopharyngeus.  That is hard to  perceive that an endoscopy but may respond to dilation in that area.  I do not think it is absolutely necessary but if you want we can set you up to repeat a scope and to stretch the top of the esophagus to see if that relieves your problems with swallowing."  A/P: 71 year old male with  long history of dry heaves dating back at least to 202 that vary in intensity and triggers. At times have been related to UTIs in past (no reported recent UTI smptoms). Has had CT scans in the past, seen GI and often difficult to pinpoint cause. Zofran has helped in past. Most recently has had worsening heaves accompanied by pain in back right side of head.  -MRI brain suggested (neuro exam reassuring)- hed prefer to hold off as had M brain 12/06/20 and feels that the headaches are related to heaves which has been a longer term issue - he wants to trial off spiriva and flonase as he feels they may be associated- I am not super confident this will resolve issue but short term trial is reasonable- continue symbicort. I think his COPD can handle short term trial off of these up to a week.  - wonder if reflux could contribute- would not advise PPI with his renal function- would be reasonable to try very reduce dose famotidine 10 mg every other day- he states he had some odd side effects in the past with Dr. Leone Payor prescribing and  he wants to hol doff - see notes above but follow up with DR. Leone Payor for potential dilation may help -discussed neil med sinus rinse trial for his symptoms as well - did not improve on augmentin so I do not think course of antibiotics likely helpful -had labs within about a month at [facility] including hgb and renal function panel pretty stable- he declines labs today  #hypertension S: medication: atenolol 50 mg BP Readings from Last 3 Encounters:  03/22/22 122/60  02/11/22 128/72  11/19/21 (!) 144/74  A/P: Controlled. Continue current medications.    #hypothyroidism S: compliant On thyroid medication- levothyroxine 112 mcg  Lab Results  Component Value Date   TSH 0.45 10/28/2021   A/P:Controlled. Continue current medications.    Recommended follow up: Return for next already scheduled visit or sooner if needed. Future Appointments  Date Time Provider Department Center  05/03/2022 10:00 AM Shelva Majestic, MD [facility]    Lab/Order associations:   ICD-10-CM   1. Dry heaves  R11.10     2. Nonintractable headache, unspecified chronicity pattern, unspecified headache type  R51.9     3. Essential hypertension  I10     4. Hypothyroidism (acquired)  E03.9      Time Spent: 45 minutes of total time (2:30 PM- 3:15 PM) was spent on the date of the encounter performing the following actions: chart review prior to seeing the patient, obtaining history, performing a medically necessary exam, counseling on the treatment plan and potential options, counseling on frustrations of the fact that he often has atypical cases, and documenting in our EHR.   Return precautions advised.  Tana Conch, MD

## 2022-04-12 ENCOUNTER — Ambulatory Visit (INDEPENDENT_AMBULATORY_CARE_PROVIDER_SITE_OTHER): Payer: Medicare HMO

## 2022-04-12 DIAGNOSIS — Z0000 Encounter for general adult medical examination without abnormal findings: Secondary | ICD-10-CM | POA: Diagnosis not present

## 2022-04-12 NOTE — Patient Instructions (Signed)
Brian Graham , Thank you for taking time to come for your Medicare Wellness Visit. I appreciate your ongoing commitment to your health goals. Please review the following plan we discussed and let me know if I can assist you in the future.   Screening recommendations/referrals: Colonoscopy: Done 07/05/12 repeat every 10 years  Recommended yearly ophthalmology/optometry visit for glaucoma screening and checkup Recommended yearly dental visit for hygiene and checkup  Vaccinations: Influenza vaccine: Done 09/03/21 repeat every year  Pneumococcal vaccine: Up to date Tdap vaccine: Done 10/06/14 repeat every 10 years  Shingles vaccine: Shingrix discussed. Please contact your pharmacy for coverage information.    Covid-19: Completed 1/26, 01/08/20   Advanced directives: Please bring a copy of your health care power of attorney and living will to the office at your convenience.  Conditions/risks identified: get rid of dry heaving   Next appointment: Follow up in one year for your annual wellness visit.   Preventive Care 41 Years and Older, Male Preventive care refers to lifestyle choices and visits with your health care provider that can promote health and wellness. What does preventive care include? A yearly physical exam. This is also called an annual well check. Dental exams once or twice a year. Routine eye exams. Ask your health care provider how often you should have your eyes checked. Personal lifestyle choices, including: Daily care of your teeth and gums. Regular physical activity. Eating a healthy diet. Avoiding tobacco and drug use. Limiting alcohol use. Practicing safe sex. Taking low doses of aspirin every day. Taking vitamin and mineral supplements as recommended by your health care provider. What happens during an annual well check? The services and screenings done by your health care provider during your annual well check will depend on your age, overall health, lifestyle risk  factors, and family history of disease. Counseling  Your health care provider may ask you questions about your: Alcohol use. Tobacco use. Drug use. Emotional well-being. Home and relationship well-being. Sexual activity. Eating habits. History of falls. Memory and ability to understand (cognition). Work and work Statistician. Screening  You may have the following tests or measurements: Height, weight, and BMI. Blood pressure. Lipid and cholesterol levels. These may be checked every 5 years, or more frequently if you are over 16 years old. Skin check. Lung cancer screening. You may have this screening every year starting at age 64 if you have a 30-pack-year history of smoking and currently smoke or have quit within the past 15 years. Fecal occult blood test (FOBT) of the stool. You may have this test every year starting at age 12. Flexible sigmoidoscopy or colonoscopy. You may have a sigmoidoscopy every 5 years or a colonoscopy every 10 years starting at age 76. Prostate cancer screening. Recommendations will vary depending on your family history and other risks. Hepatitis C blood test. Hepatitis B blood test. Sexually transmitted disease (STD) testing. Diabetes screening. This is done by checking your blood sugar (glucose) after you have not eaten for a while (fasting). You may have this done every 1-3 years. Abdominal aortic aneurysm (AAA) screening. You may need this if you are a current or former smoker. Osteoporosis. You may be screened starting at age 42 if you are at high risk. Talk with your health care provider about your test results, treatment options, and if necessary, the need for more tests. Vaccines  Your health care provider may recommend certain vaccines, such as: Influenza vaccine. This is recommended every year. Tetanus, diphtheria, and acellular pertussis (Tdap,  Td) vaccine. You may need a Td booster every 10 years. Zoster vaccine. You may need this after age  53. Pneumococcal 13-valent conjugate (PCV13) vaccine. One dose is recommended after age 31. Pneumococcal polysaccharide (PPSV23) vaccine. One dose is recommended after age 79. Talk to your health care provider about which screenings and vaccines you need and how often you need them. This information is not intended to replace advice given to you by your health care provider. Make sure you discuss any questions you have with your health care provider. Document Released: 12/05/2015 Document Revised: 07/28/2016 Document Reviewed: 09/09/2015 Elsevier Interactive Patient Education  2017 [place] Prevention in the Home Falls can cause injuries. They can happen to people of all ages. There are many things you can do to make your home safe and to help prevent falls. What can I do on the outside of my home? Regularly fix the edges of walkways and driveways and fix any cracks. Remove anything that might make you trip as you walk through a door, such as a raised step or threshold. Trim any bushes or trees on the path to your home. Use bright outdoor lighting. Clear any walking paths of anything that might make someone trip, such as rocks or tools. Regularly check to see if handrails are loose or broken. Make sure that both sides of any steps have handrails. Any raised decks and porches should have guardrails on the edges. Have any leaves, snow, or ice cleared regularly. Use sand or salt on walking paths during winter. Clean up any spills in your garage right away. This includes oil or grease spills. What can I do in the bathroom? Use night lights. Install grab bars by the toilet and in the tub and shower. Do not use towel bars as grab bars. Use non-skid mats or decals in the tub or shower. If you need to sit down in the shower, use a plastic, non-slip stool. Keep the floor dry. Clean up any water that spills on the floor as soon as it happens. Remove soap buildup in the tub or shower  regularly. Attach bath mats securely with double-sided non-slip rug tape. Do not have throw rugs and other things on the floor that can make you trip. What can I do in the bedroom? Use night lights. Make sure that you have a light by your bed that is easy to reach. Do not use any sheets or blankets that are too big for your bed. They should not hang down onto the floor. Have a firm chair that has side arms. You can use this for support while you get dressed. Do not have throw rugs and other things on the floor that can make you trip. What can I do in the kitchen? Clean up any spills right away. Avoid walking on wet floors. Keep items that you use a lot in easy-to-reach places. If you need to reach something above you, use a strong step stool that has a grab bar. Keep electrical cords out of the way. Do not use floor polish or wax that makes floors slippery. If you must use wax, use non-skid floor wax. Do not have throw rugs and other things on the floor that can make you trip. What can I do with my stairs? Do not leave any items on the stairs. Make sure that there are handrails on both sides of the stairs and use them. Fix handrails that are broken or loose. Make sure that handrails are as  long as the stairways. Check any carpeting to make sure that it is firmly attached to the stairs. Fix any carpet that is loose or worn. Avoid having throw rugs at the top or bottom of the stairs. If you do have throw rugs, attach them to the floor with carpet tape. Make sure that you have a light switch at the top of the stairs and the bottom of the stairs. If you do not have them, ask someone to add them for you. What else can I do to help prevent falls? Wear shoes that: Do not have high heels. Have rubber bottoms. Are comfortable and fit you well. Are closed at the toe. Do not wear sandals. If you use a stepladder: Make sure that it is fully opened. Do not climb a closed stepladder. Make sure that  both sides of the stepladder are locked into place. Ask someone to hold it for you, if possible. Clearly mark and make sure that you can see: Any grab bars or handrails. First and last steps. Where the edge of each step is. Use tools that help you move around (mobility aids) if they are needed. These include: Canes. Walkers. Scooters. Crutches. Turn on the lights when you go into a dark area. Replace any light bulbs as soon as they burn out. Set up your furniture so you have a clear path. Avoid moving your furniture around. If any of your floors are uneven, fix them. If there are any pets around you, be aware of where they are. Review your medicines with your doctor. Some medicines can make you feel dizzy. This can increase your chance of falling. Ask your doctor what other things that you can do to help prevent falls. This information is not intended to replace advice given to you by your health care provider. Make sure you discuss any questions you have with your health care provider. Document Released: 09/04/2009 Document Revised: 04/15/2016 Document Reviewed: 12/13/2014 Elsevier Interactive Patient Education  2017 [employer].

## 2022-04-12 NOTE — Progress Notes (Cosign Needed Addendum)
Virtual Visit via Telephone Note  I connected with  Brian Graham on 04/12/22 at 11:15 AM EDT by telephone and verified that I am speaking with the correct person using two identifiers.  Medicare Annual Wellness visit completed telephonically due to Covid-19 pandemic.   Persons participating in this call: This Health Coach and this patient.   Location: Patient: Home Provider: Office    I discussed the limitations, risks, security and privacy concerns of performing an evaluation and management service by telephone and the availability of in person appointments. The patient expressed understanding and agreed to proceed.  Unable to perform video visit due to video visit attempted and failed and/or patient does not have video capability.   Some vital signs may be absent or patient reported.   Willette Brace, LPN   Subjective:   Brian Graham is a 71 y.o. male who presents for Medicare Annual/Subsequent preventive examination.  Review of Systems     Cardiac Risk Factors include: advanced age (>29mn, >>44women);dyslipidemia;hypertension;male gender     Objective:    There were no vitals filed for this visit. There is no height or weight on file to calculate BMI.     04/12/2022   11:23 AM 07/14/2021    3:23 PM 03/06/2020   11:18 AM 12/05/2018    6:31 AM 12/04/2018    5:48 PM 11/10/2018    9:35 AM 08/22/2018   11:02 AM  Advanced Directives  Does Patient Have a Medical Advance Directive? No No No No No No No  Would patient like information on creating a medical advance directive? No - Patient declined No - Patient declined Yes (MAU/Ambulatory/Procedural Areas - Information given) No - Patient declined  No - Patient declined No - Patient declined    Current Medications (verified) Outpatient Encounter Medications as of 04/12/2022  Medication Sig   acetaminophen (TYLENOL) 500 MG tablet Take 500-1,000 mg by mouth every 6 (six) hours as needed (for pain).    albuterol  (PROVENTIL) (2.5 MG/3ML) 0.083% nebulizer solution Take 3 mLs (2.5 mg total) by nebulization every 6 (six) hours as needed for wheezing or shortness of breath. J44.9   albuterol (VENTOLIN HFA) 108 (90 Base) MCG/ACT inhaler USE 2 INHALATIONS EVERY 4 HOURS AS NEEDED   aspirin EC 81 MG tablet Take 81 mg by mouth daily.   atenolol (TENORMIN) 50 MG tablet TAKE 1 TABLET DAILY   budesonide-formoterol (SYMBICORT) 160-4.5 MCG/ACT inhaler USE 2 INHALATIONS TWICE A DAY   desonide (DESOWEN) 0.05 % cream Apply topically 2 (two) times daily. 7-10 days as needed for eczema   fluticasone (FLONASE) 50 MCG/ACT nasal spray Place 2 sprays into both nostrils daily.   levothyroxine (SYNTHROID) 112 MCG tablet TAKE 1 TABLET DAILY BEFORE BREAKFAST   mirtazapine (REMERON) 15 MG tablet Take 1 tablet (15 mg total) by mouth at bedtime.   rosuvastatin (CRESTOR) 40 MG tablet TAKE 1 TABLET DAILY (NEED APPOINTMENT)   Tiotropium Bromide Monohydrate (SPIRIVA RESPIMAT) 2.5 MCG/ACT AERS Inhale 2 puffs into the lungs daily.   traMADol (ULTRAM) 50 MG tablet TAKE 1 TABLET (50 MG TOTAL) BY MOUTH 2 (TWO) TIMES DAILY AS NEEDED FOR MODERATE PAIN OR SEVERE PAIN.   No facility-administered encounter medications on file as of 04/12/2022.    Allergies (verified) Contrast media [iodinated contrast media], Other, Keflex [cephalexin], Sulfa antibiotics, Ciprofloxacin, Levaquin [levofloxacin], and Omeprazole   History: Past Medical History:  Diagnosis Date   Anxiety    Arthritis    "left knee" (08/05/2016)  Bruises easily    CAD (coronary artery disease) of artery bypass graft 08/05/2016   Around age 12. CABG - 3 vessel.    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 Carles Collet   Former smoker 09/01/2016   Quit 2012. 40 pack years at least.    GERD (gastroesophageal reflux disease)    Headache    History of blood transfusion 08/1997   "when he had his heart surgery"   History of shingles 1970-2013 X 3   HTN (hypertension) 08/05/2016   Amlodipine '5mg'$ , atenolol '50mg'$  BID, benazepril '20mg'$    Hyperlipemia    Hypothyroidism    Lumbar disc disease    MGUS (monoclonal gammopathy of unknown significance)    Myocardial infarction ([facility]) 08/1997   Peripheral vascular disease ([facility])    PONV (postoperative nausea and vomiting)    Urinary tract infection 09/03/2017   Wears glasses    Past Surgical History:  Procedure Laterality Date   AORTOGRAM Right 08/31/2017   Procedure: AORTOGRAM BILATERAL PELVIC ANGIOGRAM WITH LEFT ILIAC ARTERY STENT;  Surgeon: Conrad Dillon, MD;  Location: [facility];  Service: Vascular;  Laterality: Right;   BACK SURGERY     CARDIAC CATHETERIZATION  08/1997   COLONOSCOPY  2014   CORONARY ARTERY BYPASS GRAFT  09/12/1997   "triple"   ESOPHAGOGASTRODUODENOSCOPY     INGUINAL HERNIA REPAIR Right [place] Left 1960s - 1974 X 4   LAPAROSCOPIC ABLATION RENAL MASS  ~ 2014   "large abscess/pus ball"   [place]   "they were wedged in the bowel"   [address] Left ~ 1965   "removed knee cap"   ROBOT ASSISTED LAPAROSCOPIC NEPHRECTOMY Left 10/07/2017   Procedure: XI ROBOTIC ASSISTED LAPAROSCOPIC NEPHRECTOMY OF PELVIC KIDNEY;  Surgeon: Alexis Frock, MD;  Location: [facility];  Service: Urology;  Laterality: Left;  Place robot patient tower at foot of bed like prostate per Dr. Tresa Moore. Pull extra staple loads.   TONSILLECTOMY     Family History  Problem Relation Age of Onset   Alcohol abuse Mother    Alcohol abuse Father        not involved in life    Alcoholism Sister    Healthy Sister    Healthy Sister    Allergic rhinitis Neg Hx    Angioedema Neg Hx    Asthma Neg Hx    Eczema Neg Hx    Immunodeficiency Neg Hx    Urticaria Neg Hx    Social History   Socioeconomic History   Marital status: Married    Spouse name: Not on file   Number of children: 2   Years of education: 12   Highest education level: Not on file  Occupational History   Occupation: Retired   Tobacco Use   Smoking status: Former    Packs/day: 1.00    Years: 45.00    Pack years: 45.00    Types: Cigarettes    Quit date: 07/10/2017  Years since quitting: 4.7   Smokeless tobacco: Never  Vaping Use   Vaping Use: Never used  Substance and Sexual Activity   Alcohol use: Yes    Alcohol/week: 14.0 standard drinks    Types: 14 Shots of liquor per week    Comment: daily 2 shots of vodka   Drug use: No   Sexual activity: Not Currently  Other Topics Concern   Not on file  Social History Narrative   Lives with wife. 2 adopted daughters from [place].       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         Former smoker no drugs 2 shots of vodka daily   Right handed    Tea daily   Social Determinants of Radio broadcast assistant Strain: Low Risk    Difficulty of Paying Living Expenses: Not hard at all  Food Insecurity: No Food Insecurity   Worried About Charity fundraiser in the Last Year: Never true   Arboriculturist in the Last Year: Never true  Transportation Needs: No Transportation Needs   Lack of Transportation (Medical): No   Lack of Transportation (Non-Medical): No  Physical Activity: Inactive   Days of Exercise per Week: 0 days   Minutes of Exercise per Session: 0 min  Stress: No Stress Concern Present   Feeling of Stress : Not at all  Social Connections: Moderately Isolated   Frequency of Communication with Friends and Family: Never   Frequency of Social Gatherings with  Friends and Family: More than three times a week   Attends Religious Services: Never   Marine scientist or Organizations: No   Attends Music therapist: Never   Marital Status: Married    Tobacco Counseling Counseling given: Not Answered   Clinical Intake:  Pre-visit preparation completed: Yes  Pain : No/denies pain     BMI - recorded: 24.52 Nutritional Status: BMI of 19-24  Normal Nutritional Risks: None Diabetes: No  How often do you need to have someone help you when you read instructions, pamphlets, or other written materials from your doctor or pharmacy?: 1 - Never  Diabetic?no  Interpreter Needed?: No  Information entered by :: Charlott Rakes, LPN   Activities of Daily Living    04/12/2022   11:25 AM 07/07/2021    4:47 PM  In your present state of health, do you have any difficulty performing the following activities:  Hearing? 1 0  Comment left ear loss   Vision? 0 0  Difficulty concentrating or making decisions? 0 0  Walking or climbing stairs? 1 1  Comment SOB at times   Dressing or bathing? 0 0  Doing errands, shopping? 0 0  Preparing Food and eating ? N   Using the Toilet? N   In the past six months, have you accidently leaked urine? N   Do you have problems with loss of bowel control? N   Managing your Medications? N   Managing your Finances? N   Housekeeping or managing your Housekeeping? N     Patient Care Team: [name], MD as PCP - General (Family Medicine) Juanito Doom, MD as Consulting Physician (Pulmonary Disease) Minus Breeding, MD as Consulting Physician (Cardiology) Lyndee Hensen, PT as Physical Therapist (Physical Therapy) Edrick Oh, MD as Consulting Physician (Nephrology) Nicholas Lose, MD as Consulting Physician (Hematology and Oncology) Izora Gala, MD as  Consulting Physician (Otolaryngology) Gatha Mayer, MD as Consulting Physician (Gastroenterology)  Indicate any recent Medical  Services you may have received from other than [facility] providers in the past year (date may be approximate).     Assessment:   This is a routine wellness examination for Z[place].  Hearing/Vision screen Hearing Screening - Comments:: Pt wears a hearing aid left ear loss Vision Screening - Comments:: [facility] for annual eye exams   Dietary issues and exercise activities discussed: Current Exercise Habits: The patient does not participate in regular exercise at present   Goals Addressed             This Visit's Progress    Patient Stated       Get rid of dry heaves        Depression Screen    04/12/2022   11:22 AM 07/07/2021    4:46 PM 04/22/2021    8:41 AM 03/06/2020   11:19 AM 02/11/2020   11:31 AM 08/13/2019    9:48 AM 11/28/2018   12:30 PM  PHQ 2/9 Scores  PHQ - 2 Score 1 0 '1 3 3 1 '$ 0  PHQ- 9 Score '1   5 5      '$ Fall Risk    04/12/2022   11:25 AM 07/07/2021    4:46 PM 03/06/2020   11:19 AM 08/13/2019    9:49 AM 02/07/2019    9:36 AM  Fall Risk   Falls in the past year? 0 0 0 0 0  Number falls in past yr: 0 0 0 0   Injury with Fall? 0 0 0 0   Risk for fall due to : Impaired vision No Fall Risks     Follow up Falls prevention discussed Falls evaluation completed Falls evaluation completed;Education provided;Falls prevention discussed      FALL RISK PREVENTION PERTAINING TO THE HOME:  Any stairs in or around the home? Yes  If so, are there any without handrails? No  Home free of loose throw rugs in walkways, pet beds, electrical cords, etc? Yes  Adequate lighting in your home to reduce risk of falls? Yes   ASSISTIVE DEVICES UTILIZED TO PREVENT FALLS:  Life alert? No  Use of a cane, walker or w/c? Yes  Grab bars in the bathroom? Yes  Shower chair or bench in shower? Yes  Elevated toilet seat or a handicapped toilet? No   TIMED UP AND GO:  Was the test performed? No .  Cognitive Function:        04/12/2022   11:26 AM 03/06/2020   11:19 AM  6CIT  Screen  What Year? 0 points 0 points  What month? 0 points 0 points  What time? 0 points 0 points  Count back from 20 0 points 0 points  Months in reverse 0 points 0 points  Repeat phrase 0 points 0 points  Total Score 0 points 0 points    Immunizations Immunization History  Administered Date(s) Administered   Fluad Quad(high Dose 65+) 07/31/2019, 09/03/2021   Influenza Split 08/30/2013, 08/18/2016   Influenza, High Dose Seasonal PF 09/14/2017   Influenza,inj,Quad PF,6+ Mos 07/23/2016   Influenza,inj,quad, With Preservative 07/23/2016   Influenza-Unspecified 08/30/2013, 09/01/2018, 07/23/2020   PFIZER(Purple Top)SARS-COV-2 Vaccination 12/18/2019, 01/08/2020   Pneumococcal Conjugate-13 04/03/2013   Pneumococcal Polysaccharide-23 06/10/2016   Pneumococcal-Unspecified 04/03/2013, 07/02/2016   Tdap 06/11/2007, 10/06/2014    TDAP status: Up to date  Flu Vaccine status: Up to date  Pneumococcal vaccine status: Up  to date  Covid-19 vaccine status: Completed vaccines  Qualifies for Shingles Vaccine? Yes   Zostavax completed No   Shingrix Completed?: No.    Education has been provided regarding the importance of this vaccine. Patient has been advised to call insurance company to determine out of pocket expense if they have not yet received this vaccine. Advised may also receive vaccine at local pharmacy or Health Dept. Verbalized acceptance and understanding.  Screening Tests Health Maintenance  Topic Date Due   Zoster Vaccines- Shingrix (1 of 2) 05/14/2022 (Originally 06/26/1970)   DEXA SCAN  02/12/2023 (Originally 06/21/2020)   INFLUENZA VACCINE  06/22/2022   COLONOSCOPY (Pts 45-47yr Insurance coverage will need to be confirmed)  07/05/2022   TETANUS/TDAP  10/06/2024   Pneumonia Vaccine 72 Years old  Completed   Hepatitis C Screening  Completed   HPV VACCINES  Aged Out   COVID-19 Vaccine  Discontinued    Health Maintenance  There are no preventive care reminders to  display for this patient.  Colorectal cancer screening: Type of screening: Colonoscopy. Completed 07/05/12. Repeat every 10 years  Additional Screening:  Hepatitis C Screening:  Completed 08/13/19  Vision Screening: Recommended annual ophthalmology exams for early detection of glaucoma and other disorders of the eye. Is the patient up to date with their annual eye exam?  Yes  Who is the provider or what is the name of the office in which the patient attends annual eye exams? [facility]  If pt is not established with a provider, would they like to be referred to a provider to establish care? No .   Dental Screening: Recommended annual dental exams for proper oral hygiene  Community Resource Referral / Chronic Care Management: CRR required this visit?  No   CCM required this visit?  No      Plan:     I have personally reviewed and noted the following in the patient's chart:   Medical and social history Use of alcohol, tobacco or illicit drugs  Current medications and supplements including opioid prescriptions. Patient is currently taking opioid prescriptions. Information provided to patient regarding non-opioid alternatives. Patient advised to discuss non-opioid treatment plan with their provider. Functional ability and status Nutritional status Physical activity Advanced directives List of other physicians Hospitalizations, surgeries, and ER visits in previous 12 months Vitals Screenings to include cognitive, depression, and falls Referrals and appointments  In addition, I have reviewed and discussed with patient certain preventive protocols, quality metrics, and best practice recommendations. A written personalized care plan for preventive services as well as general preventive health recommendations were provided to patient.     TWillette Brace LPN   [id number]  Nurse Notes: none

## 2022-04-18 ENCOUNTER — Other Ambulatory Visit: Payer: Self-pay | Admitting: Adult Health

## 2022-05-03 ENCOUNTER — Encounter: Payer: Self-pay | Admitting: Family Medicine

## 2022-05-03 ENCOUNTER — Ambulatory Visit (INDEPENDENT_AMBULATORY_CARE_PROVIDER_SITE_OTHER): Payer: Medicare HMO | Admitting: Family Medicine

## 2022-05-03 VITALS — BP 122/70 | HR 70 | Temp 97.8°F | Ht 68.0 in | Wt 159.0 lb

## 2022-05-03 DIAGNOSIS — E039 Hypothyroidism, unspecified: Secondary | ICD-10-CM

## 2022-05-03 DIAGNOSIS — Z23 Encounter for immunization: Secondary | ICD-10-CM | POA: Diagnosis not present

## 2022-05-03 DIAGNOSIS — Z0000 Encounter for general adult medical examination without abnormal findings: Secondary | ICD-10-CM | POA: Diagnosis not present

## 2022-05-03 DIAGNOSIS — N184 Chronic kidney disease, stage 4 (severe): Secondary | ICD-10-CM

## 2022-05-03 DIAGNOSIS — I745 Embolism and thrombosis of iliac artery: Secondary | ICD-10-CM

## 2022-05-03 DIAGNOSIS — I10 Essential (primary) hypertension: Secondary | ICD-10-CM

## 2022-05-03 DIAGNOSIS — E785 Hyperlipidemia, unspecified: Secondary | ICD-10-CM

## 2022-05-03 DIAGNOSIS — E559 Vitamin D deficiency, unspecified: Secondary | ICD-10-CM | POA: Diagnosis not present

## 2022-05-03 DIAGNOSIS — E538 Deficiency of other specified B group vitamins: Secondary | ICD-10-CM | POA: Diagnosis not present

## 2022-05-03 DIAGNOSIS — D692 Other nonthrombocytopenic purpura: Secondary | ICD-10-CM

## 2022-05-03 LAB — CBC WITH DIFFERENTIAL/PLATELET
Basophils Absolute: 0 10*3/uL (ref 0.0–0.1)
Basophils Relative: 0.4 % (ref 0.0–3.0)
Eosinophils Absolute: 0.5 10*3/uL (ref 0.0–0.7)
Eosinophils Relative: 6.7 % — ABNORMAL HIGH (ref 0.0–5.0)
HCT: 35.1 % — ABNORMAL LOW (ref 39.0–52.0)
Hemoglobin: 11.3 g/dL — ABNORMAL LOW (ref 13.0–17.0)
Lymphocytes Relative: 14.7 % (ref 12.0–46.0)
Lymphs Abs: 1.2 10*3/uL (ref 0.7–4.0)
MCHC: 32.3 g/dL (ref 30.0–36.0)
MCV: 94.7 fl (ref 78.0–100.0)
Monocytes Absolute: 0.8 10*3/uL (ref 0.1–1.0)
Monocytes Relative: 10.8 % (ref 3.0–12.0)
Neutro Abs: 5.3 10*3/uL (ref 1.4–7.7)
Neutrophils Relative %: 67.4 % (ref 43.0–77.0)
Platelets: 210 10*3/uL (ref 150.0–400.0)
RBC: 3.7 Mil/uL — ABNORMAL LOW (ref 4.22–5.81)
RDW: 15 % (ref 11.5–15.5)
WBC: 7.9 10*3/uL (ref 4.0–10.5)

## 2022-05-03 LAB — COMPREHENSIVE METABOLIC PANEL
ALT: 26 U/L (ref 0–53)
AST: 26 U/L (ref 0–37)
Albumin: 3.8 g/dL (ref 3.5–5.2)
Alkaline Phosphatase: 63 U/L (ref 39–117)
BUN: 28 mg/dL — ABNORMAL HIGH (ref 6–23)
CO2: 30 mEq/L (ref 19–32)
Calcium: 9.3 mg/dL (ref 8.4–10.5)
Chloride: 104 mEq/L (ref 96–112)
Creatinine, Ser: 2.55 mg/dL — ABNORMAL HIGH (ref 0.40–1.50)
GFR: 24.57 mL/min — ABNORMAL LOW (ref >60.00)
Glucose, Bld: 85 mg/dL (ref 70–99)
Potassium: 4.8 mEq/L (ref 3.5–5.1)
Sodium: 141 mEq/L (ref 135–145)
Total Bilirubin: 0.7 mg/dL (ref 0.2–1.2)
Total Protein: 7 g/dL (ref 6.0–8.3)

## 2022-05-03 LAB — LIPID PANEL
Cholesterol: 96 mg/dL (ref 0–200)
HDL: 53.1 mg/dL (ref >39.00)
LDL Cholesterol: 27 mg/dL (ref 0–99)
NonHDL: 42.67
Total CHOL/HDL Ratio: 2
Triglycerides: 76 mg/dL (ref 0.0–149.0)
VLDL: 15.2 mg/dL (ref 0.0–40.0)

## 2022-05-03 LAB — VITAMIN D 25 HYDROXY (VIT D DEFICIENCY, FRACTURES): VITD: 39.84 ng/mL (ref 30.00–100.00)

## 2022-05-03 LAB — TSH: TSH: 0.11 u[IU]/mL — ABNORMAL LOW (ref 0.35–5.50)

## 2022-05-03 LAB — VITAMIN B12: Vitamin B-12: 1107 pg/mL — ABNORMAL HIGH (ref 211–911)

## 2022-05-03 MED ORDER — DOXYCYCLINE HYCLATE 100 MG PO TABS: 100.0000 mg | ORAL_TABLET | Freq: Two times a day (BID) | ORAL | 0 refills | Status: AC

## 2022-05-03 NOTE — Progress Notes (Signed)
Phone: [phone]   Subjective:  Patient presents today for their annual physical. Chief complaint-noted.   See problem oriented charting- ROS- full  review of systems was completed and negative  except for: ecreased activity with COPD, appetite remains low, fatigue, congestion, hearing loss, PND, voice change, eye discharge, cough, shortness of breath, nausea, back pain, allergies, tremors agitation, anxiety (fan helps)  The following were reviewed and entered/updated in epic: Past Medical History:  Diagnosis Date   Anxiety    Arthritis    "left knee" (08/05/2016)   Bruises easily    CAD (coronary artery disease) of artery bypass graft 08/05/2016   Around age 22. CABG - 3 vessel.    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 Carles Collet   Former smoker 09/01/2016   Quit 2012. 40 pack years at least.    GERD (gastroesophageal reflux disease)    Headache    History of blood transfusion 08/1997   "when he had his heart surgery"   History of shingles 1970-2013 X 3   HTN (hypertension) 08/05/2016   Amlodipine '5mg'$ , atenolol '50mg'$  BID, benazepril '20mg'$    Hyperlipemia    Hypothyroidism    Lumbar disc disease    MGUS (monoclonal gammopathy of unknown significance)    Myocardial infarction ([place]) 08/1997   Peripheral vascular disease (HCC)    PONV (postoperative nausea and vomiting)    Urinary tract infection 09/03/2017   Wears glasses    Patient Active Problem List   Diagnosis Date Noted   Chronic kidney disease (CKD), stage IV (severe) ([place]) 08/13/2019    Priority: High   S/p nephrectomy 12/05/2018     Priority: High   MGUS (monoclonal gammopathy of unknown significance) 08/02/2018    Priority: High   Osteoporosis 06/26/2018    Priority: High   Atherosclerosis of native arteries of extremity with intermittent claudication ([place]) 07/06/2017    Priority: High   Weight loss 04/23/2017    Priority: High   Former smoker 09/01/2016    Priority: High   COPD, 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    Priority: High   Hx of CABG 08/05/2016    Priority: High   CAD (coronary artery disease) of artery bypass graft 08/05/2016    Priority: High   Vitamin D deficiency 01/20/2021    Priority: Medium    Vitamin B12 deficiency 01/20/2021    Priority: Medium    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    Priority: Medium    History of adenomatous polyp of colon 04/22/2017    Priority: Medium    Hyperlipidemia 04/04/2017    Priority: Medium    Hypothyroidism (acquired) 04/04/2017    Priority: Medium    Essential hypertension 08/05/2016    Priority: Medium    Essential tremor 06/11/2016    Priority: Medium    Perennial allergic rhinitis 01/24/2018    Priority: Low   Anxiety 07/24/2017    Priority: Low   Pyelonephritis 04/23/2017    Priority: Low   Aortic atherosclerosis ([place]) 04/13/2017    Priority: Low   Seasonal allergies 04/04/2017    Priority:  Low   Myalgia 10/04/2016    Priority: Low   Dysuria 08/23/2016    Priority: Low   Chronic pain of both knees 08/23/2016    Priority: Low   Tension headache 06/11/2016    Priority: Low   Senile purpura ([place]) 05/03/2022   Hearing abnormally acute, left 04/03/2020   Healthcare maintenance 04/03/2020   Constipation 09/03/2017   Past Surgical History:  Procedure Laterality Date   AORTOGRAM Right 08/31/2017   Procedure: AORTOGRAM BILATERAL PELVIC ANGIOGRAM WITH LEFT ILIAC ARTERY STENT;  Surgeon: Conrad [place], MD;  Location: [facility];  Service: Vascular;  Laterality: Right;   BACK SURGERY     CARDIAC CATHETERIZATION  08/1997   COLONOSCOPY  2014   CORONARY  ARTERY BYPASS GRAFT  09/12/1997   "triple"   ESOPHAGOGASTRODUODENOSCOPY     INGUINAL HERNIA REPAIR Right [place] Left 1960s - 1974 X 4   LAPAROSCOPIC ABLATION RENAL MASS  ~ 2014   "large abscess/pus ball"   [place]   "they were wedged in the bowel"   [place] Left ~ 1965   "removed knee cap"   ROBOT ASSISTED LAPAROSCOPIC NEPHRECTOMY Left 10/07/2017   Procedure: XI ROBOTIC ASSISTED LAPAROSCOPIC NEPHRECTOMY OF PELVIC KIDNEY;  Surgeon: Alexis Frock, MD;  Location: [facility];  Service: Urology;  Laterality: Left;  Place robot patient tower at foot of bed like prostate per Dr. Tresa Moore. Pull extra staple loads.   TONSILLECTOMY      Family History  Problem Relation Age of Onset   Alcohol abuse Mother    Alcohol abuse Father        not involved in life   Alcoholism Sister    Healthy Sister    Healthy Sister    Allergic rhinitis Neg Hx    Angioedema Neg Hx    Asthma Neg Hx    Eczema Neg Hx    Immunodeficiency Neg Hx    Urticaria Neg Hx     Medications- reviewed and updated Current Outpatient Medications  Medication Sig Dispense Refill   acetaminophen (TYLENOL) 500 MG tablet Take 500-1,000 mg by mouth every 6 (six) hours as needed (for pain).      albuterol (PROVENTIL) (2.5 MG/3ML) 0.083% nebulizer solution Take 3 mLs (2.5 mg total) by nebulization every 6 (six) hours as needed for wheezing or shortness of breath. J44.9 300 mL 11   albuterol (VENTOLIN HFA) 108 (90 Base) MCG/ACT inhaler USE 2 INHALATIONS EVERY 4 HOURS AS NEEDED 25.5 g 2   aspirin EC 81 MG tablet Take 81 mg by mouth daily.     atenolol (TENORMIN) 50 MG tablet TAKE 1 TABLET DAILY 90 tablet 3   budesonide-formoterol (SYMBICORT) 160-4.5 MCG/ACT inhaler USE 2 INHALATIONS TWICE A DAY 30.6 g 3   desonide (DESOWEN) 0.05 % cream Apply topically 2 (two) times daily. 7-10 days as needed for eczema 30 g 5   levothyroxine  (SYNTHROID) 112 MCG tablet TAKE 1 TABLET DAILY BEFORE BREAKFAST 90 tablet 3   mirtazapine (REMERON) 15 MG tablet Take 1 tablet (15 mg total) by mouth at bedtime. 90 tablet 3   rosuvastatin (CRESTOR) 40 MG tablet TAKE 1 TABLET DAILY (NEED APPOINTMENT) 90 tablet 3   SPIRIVA RESPIMAT 2.5 MCG/ACT AERS USE 2 INHALATIONS DAILY (NEED APPOINTMENT FOR FURTHER REFILLS) 12 g 3   traMADol (ULTRAM) 50 MG tablet TAKE 1 TABLET (50 MG TOTAL) BY MOUTH  2 (TWO) TIMES DAILY AS NEEDED FOR MODERATE PAIN OR SEVERE PAIN. 60 tablet 5   No current facility-administered medications for this visit.    Allergies-reviewed and updated Allergies  Allergen Reactions   Contrast Media [Iodinated Contrast Media] Other (See Comments)    NOT ALLOWED DUE TO KIDNEY ISSUES.   Other Itching and Rash    Gel used for ultrasound SEVERELY broke out the skin    Keflex [Cephalexin] Diarrhea, Nausea Only and Other (See Comments)    Insomnia   Sulfa Antibiotics Other (See Comments)    Insomnia   Ciprofloxacin Diarrhea   Levaquin [Levofloxacin] Other (See Comments)    Insomnia    Omeprazole Diarrhea    Social History   Social History Narrative   Lives with wife. 2 adopted daughters from [place].       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 Oropharynx normal Neck: no thyromegaly CV: RRR no murmurs rubs or gallops Lungs: CTAB no crackles, wheeze, rhonchi Abdomen: soft/nontender/nondistended/normal bowel sounds. No rebound or guarding.  Ext: no edema Skin: warm, dry Neuro: grossly normal, moves all extremities, PERRLA    Assessment and Plan  71 y.o. male presenting  for annual physical.  Health Maintenance counseling: 1. Anticipatory guidance: Patient counseled regarding regular dental exams - advised q6 months, eye exams -yearly,  avoiding smoking and second hand smoke , limiting alcohol to 2 beverages per day , no illicit drugs.   2. Risk factor reduction:  Advised patient of need for regular exercise and diet rich and fruits and vegetables to reduce risk of heart attack and stroke.  Exercise- limited by breathing.  Diet/weight management-weight largely stable thankfully.  Wt Readings from Last 3 Encounters:  05/03/22 159 lb (72.1 kg)  03/22/22 161 lb 3.2 oz (73.1 kg)  02/11/22 161 lb 9.6 oz (73.3 kg)  3. 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 in past- hesitant about repeat even with respiratory disease -opts out of shingirx -prevnar 20 today Immunization History  Administered Date(s) Administered   Fluad Quad(high Dose 65+) 07/31/2019, 09/03/2021   Influenza Split 08/30/2013, 08/18/2016   Influenza, High Dose Seasonal PF 09/14/2017   Influenza,inj,Quad PF,6+ Mos 07/23/2016   Influenza,inj,quad, With Preservative 07/23/2016   Influenza-Unspecified 08/30/2013, 09/01/2018, 07/23/2020   PFIZER(Purple Top)SARS-COV-2 Vaccination 12/18/2019, 01/08/2020   Pneumococcal Conjugate-13 04/03/2013   Pneumococcal Polysaccharide-23 06/10/2016   Pneumococcal-Unspecified 04/03/2013, 07/02/2016   Tdap 06/11/2007, 10/06/2014  4. Prostate cancer screening- opts out. has followed with Dr. Tresa Moore in the past- has been 2-3 years  40. Colon cancer screening - 06/25/2012 with 10-year repeat planned-with overall health we also discussed Cologuard option- he is interested. Has had polyps but never adenomas or sessile serrated it sounds like- was always told 10 years  6. Skin cancer screening-no dermatologist. advised regular sunscreen use. Denies worrisome, changing, or new skin lesions.  7. Smoking associated screening (lung cancer  screening, AAA screen 65-75, UA)-former smoker- quit in 2018.  Enrolled in lung cancer  screening program.  Urines with nephrology and urology 8. STD screening - opts out- monogamous   Status of chronic or acute concerns   #Headache and dry heaves-see extensive note Mar 22, 2022 From assessment and plan " 71 year old male with  long history of dry heaves dating back at least to 202 that vary in intensity and triggers. At times have been related to UTIs in past (no reported recent UTI smptoms). Has had CT scans in the past, seen GI and often difficult to pinpoint cause. Zofran has helped in past. Most recently has had worsening heaves accompanied by pain in back right side of head.  -MRI brain suggested (neuro exam reassuring)- hed prefer to hold off as had MRI brain 12/06/20 and feels that the headaches are related to heaves which has been a longer term issue - he wants to trial off spiriva and flonase as he feels they may be associated- I am not super confident this will resolve issue but short term trial is reasonable- continue symbicort. I think his COPD can handle short term trial off of these up to a week.  - wonder if reflux could contribute- would not advise PPI with his renal function- would be reasonable to try very reduce dose famotidine 10 mg every other day- he states he had some odd side effects in the past with Dr. Carlean Purl prescribing and he wants to hol doff - see notes above but follow up with DR. Carlean Purl for potential dilation may help -discussed neil med sinus rinse trial for his symptoms as well - did not improve on augmentin so I do not think course of antibiotics likely helpful -had labs within about a month at [facility] including hgb and renal function panel pretty stable- he declines labs today" -Today reports doing better off of flonase. Gets postnasal drip and coughs up green sputum but the dry heaves are way better since stopping the flonase. Wife seems less confident -  he feels the post nasal drip is still a trigger but felt worse on the flonase with dry heaves. Prior headaches are better- no sinus pressure. Already taking zyrtec- on full tablet- per recs should be on half tablet -with consistent green discharge in AM we will trial doxycycline (no renal adjustment)- advised of risks with sun -wants to hold off on dilation with Dr. Carlean Purl  #COPD-overall worsening with time- feels he may need to avoid yardwork. Continue symbicort and spiriva   # CKD IV S: follows with [facility] (prior with Dr. Justin Mend) -previously thought . likely going to go on peritoneal dialysis within 73months of 11/26/2019 per patient report after visit but later filtration rate improved to greater than 30!  In 2022 trended back down into the 20s  - not interested in dialysis - not candidate for transplant A/P: [facility]- update with labs  # Hypertension S: compliant with atenolol '50mg'$ .  -Off benazepril '20mg'$  due to renal function.  Prior on amlodipine BP Readings from Last 3 Encounters:  05/03/22 122/70  03/22/22 122/60  02/11/22 128/72   A/P:Controlled. Continue current medications.    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 CBridgett Larssonin the past and Dr. CCarlis Abbottmost recently with most recent ABI in normal range on 11/10/2021 -No chest pain. stable shortness of breath. No claudication  Lab Results  Component Value Date   CHOL 121 04/22/2021   HDL 56.60 04/22/2021  LDLCALC 45 04/22/2021   LDLDIRECT 45.0 02/07/2019   TRIG 96.0 04/22/2021   CHOLHDL 2 04/22/2021   A/P: Iliac artery occlusion appears stable based on recent imaging. CAD appears stable- breathing issues appear to be COPD related Lipids have been well controlled but update today.  For now continue current medications   #hypothyroidism S: compliant On thyroid medication-levothyroxine 112  mcg Lab Results  Component Value Date   TSH 0.45 10/28/2021   A/P:  hopefully stable- update TSH today. Continue current meds for now    # B12 deficiency S: Current treatment/medication (oral vs. IM): Oral B12 1000 mcg daily Lab Results  Component Value Date   VITAMINB12 395 04/22/2021  A/P:   hopefully stable- update  today. Continue current meds for now  - if lower than last year consider going back to injections  #Vitamin D deficiency S: Medication: none right now Last vitamin D Lab Results  Component Value Date   VD25OH 47.41 04/22/2021  A/P:   hopefully stable- update vitamin D today.  If low consider restart.   #senile purpura- noted. Stable. Check cbc at least annually  #on remeron -through Dr. Carlean Purl- seems to help stabilize appetite  Recommended follow up: Return in about 4 months (around 09/02/2022) for followup or sooner if needed.Schedule b4 you leave. Future Appointments  Date Time Provider [place]  04/22/2023 12:00 PM [facility] HEALTH COACH [facility]   Lab/Order associations:NOT fasting other than tea   ICD-10-CM   1. Preventative health care  Z00.00     2. Essential hypertension  I10     3. Hyperlipidemia, unspecified hyperlipidemia type  E78.5     4. Hypothyroidism (acquired)  E03.9     5. Vitamin D deficiency  E55.9     6. Senile purpura (HCC)  D69.2     7. Iliac artery occlusion (HCC) Chronic I74.5     8. Chronic kidney disease (CKD), stage IV (severe) (HCC) Chronic N18.4     9. Vitamin B12 deficiency  E53.8       No orders of the defined types were placed in this encounter.   Return precautions advised.  Garret Reddish, MD

## 2022-05-03 NOTE — Patient Instructions (Addendum)
Please stop by lab before you go If you have mychart- we will send your results within 3 business days of [place] receiving them.  If you do not have mychart- we will call you about results within 5 business days of [place] receiving them.  *please also note that you will see labs on mychart as soon as they post. I will later go in and write notes on them- will say "notes from Dr. [name]"   Get plugged back in with dentist  Prevnar 20 today  Doxycycline to see if that helps with the green drainage from throat-if not improving we could certainly consider ear nose and throat referral-please let us know  Recommended follow up: Return in about 4 months (around 09/02/2022) for followup or sooner if needed.Schedule b4 you leave.

## 2022-05-03 NOTE — Addendum Note (Signed)
Addended by: Clyde Lundborg A on: 05/03/2022 10:55 AM   Modules accepted: Orders

## 2022-05-04 ENCOUNTER — Other Ambulatory Visit: Payer: Self-pay

## 2022-05-04 DIAGNOSIS — E039 Hypothyroidism, unspecified: Secondary | ICD-10-CM

## 2022-05-04 MED ORDER — LEVOTHYROXINE SODIUM 100 MCG PO TABS: 100.0000 ug | ORAL_TABLET | Freq: Every day | ORAL | 3 refills | Status: DC

## 2022-05-18 ENCOUNTER — Other Ambulatory Visit: Payer: Self-pay | Admitting: Family Medicine

## 2022-06-14 ENCOUNTER — Other Ambulatory Visit (INDEPENDENT_AMBULATORY_CARE_PROVIDER_SITE_OTHER): Payer: Medicare HMO

## 2022-06-14 ENCOUNTER — Other Ambulatory Visit: Payer: Medicare HMO

## 2022-06-14 DIAGNOSIS — E039 Hypothyroidism, unspecified: Secondary | ICD-10-CM | POA: Diagnosis not present

## 2022-06-14 LAB — TSH: TSH: 0.76 u[IU]/mL (ref 0.35–5.50)

## 2022-07-20 ENCOUNTER — Other Ambulatory Visit: Payer: Self-pay | Admitting: Family Medicine

## 2022-07-20 ENCOUNTER — Encounter: Payer: Self-pay | Admitting: Family Medicine

## 2022-08-16 ENCOUNTER — Encounter: Payer: Self-pay | Admitting: *Deleted

## 2022-08-29 ENCOUNTER — Other Ambulatory Visit: Payer: Self-pay | Admitting: Internal Medicine

## 2022-09-02 ENCOUNTER — Ambulatory Visit (INDEPENDENT_AMBULATORY_CARE_PROVIDER_SITE_OTHER): Payer: Medicare HMO | Admitting: Family Medicine

## 2022-09-02 ENCOUNTER — Encounter: Payer: Self-pay | Admitting: Family Medicine

## 2022-09-02 VITALS — BP 136/74 | HR 89 | Temp 97.7°F | Ht 68.0 in | Wt 156.2 lb

## 2022-09-02 DIAGNOSIS — Z23 Encounter for immunization: Secondary | ICD-10-CM

## 2022-09-02 DIAGNOSIS — E039 Hypothyroidism, unspecified: Secondary | ICD-10-CM

## 2022-09-02 DIAGNOSIS — E538 Deficiency of other specified B group vitamins: Secondary | ICD-10-CM

## 2022-09-02 DIAGNOSIS — I25810 Atherosclerosis of coronary artery bypass graft(s) without angina pectoris: Secondary | ICD-10-CM

## 2022-09-02 DIAGNOSIS — E785 Hyperlipidemia, unspecified: Secondary | ICD-10-CM

## 2022-09-02 DIAGNOSIS — J449 Chronic obstructive pulmonary disease, unspecified: Secondary | ICD-10-CM

## 2022-09-02 DIAGNOSIS — N184 Chronic kidney disease, stage 4 (severe): Secondary | ICD-10-CM

## 2022-09-02 MED ORDER — ROSUVASTATIN CALCIUM 40 MG PO TABS: ORAL_TABLET | ORAL | 3 refills | Status: DC

## 2022-09-02 NOTE — Patient Instructions (Addendum)
RSV at pharmacy  Recommended follow up: Return in about 4 months (around 01/03/2023) for followup or sooner if needed.Schedule b4 you leave. -but also go ahead and schedule physical 1 year out from last physical in June 2023

## 2022-09-02 NOTE — Progress Notes (Signed)
Phone [phone] In person visit   Subjective:   Brian Graham is a 71 y.o. year old very pleasant male patient who presents for/with See problem oriented charting Chief Complaint  Patient presents with   Hypertension   Follow-up   Hypothyroidism   Past Medical History-  Patient Active Problem List   Diagnosis Date Noted   Chronic kidney disease (CKD), stage IV (severe) ([place]) 08/13/2019    Priority: High   S/p nephrectomy 12/05/2018    Priority: High   MGUS (monoclonal gammopathy of unknown significance) 08/02/2018    Priority: High   Osteoporosis 06/26/2018    Priority: High   Atherosclerosis of native arteries of extremity with intermittent claudication ([place]) 07/06/2017    Priority: High   Weight loss 04/23/2017    Priority: High   Former smoker 09/01/2016    Priority: High   COPD, 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    Priority: High   Hx of CABG 08/05/2016    Priority: High   CAD (coronary artery disease) of artery bypass graft 08/05/2016    Priority: High   Vitamin D deficiency 01/20/2021    Priority: Medium    Vitamin B12 deficiency 01/20/2021    Priority: Medium    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    Priority: Medium    History of adenomatous polyp of colon 04/22/2017    Priority: Medium    Hyperlipidemia 04/04/2017    Priority: Medium    Hypothyroidism (acquired) 04/04/2017    Priority: Medium    Essential hypertension 08/05/2016    Priority: Medium    Essential tremor 06/11/2016    Priority: Medium    Perennial allergic rhinitis 01/24/2018    Priority: Low   Anxiety 07/24/2017    Priority: Low   Pyelonephritis 04/23/2017    Priority: Low   Aortic atherosclerosis ([place]) 04/13/2017    Priority: Low   Seasonal allergies 04/04/2017    Priority: Low   Myalgia 10/04/2016    Priority: Low   Dysuria 08/23/2016    Priority: Low   Chronic pain of both knees 08/23/2016    Priority: Low   Tension headache 06/11/2016    Priority: Low   Senile  purpura ([place]) 05/03/2022   Hearing abnormally acute, left 04/03/2020   Healthcare maintenance 04/03/2020   Constipation 09/03/2017    Medications- reviewed and updated Current Outpatient Medications  Medication Sig Dispense Refill   acetaminophen (TYLENOL) 500 MG tablet Take 500-1,000 mg by mouth every 6 (six) hours as needed (for pain).      albuterol (PROVENTIL) (2.5 MG/3ML) 0.083% nebulizer solution TAKE 3 MLS (2.5 MG TOTAL) BY NEBULIZATION EVERY 6 (SIX) HOURS AS NEEDED FOR WHEEZING OR SHORTNESS OF BREATH. J44.9 300 mL 11   albuterol (VENTOLIN HFA) 108 (90 Base) MCG/ACT inhaler USE 2 INHALATIONS EVERY 4 HOURS AS NEEDED 25.5 g 2   aspirin EC 81 MG tablet Take 81 mg by mouth daily.     atenolol (TENORMIN) 50 MG tablet TAKE 1 TABLET DAILY 90 tablet 3   budesonide-formoterol (SYMBICORT) 160-4.5 MCG/ACT inhaler USE 2 INHALATIONS TWICE A DAY 30.6 g 3   desonide (DESOWEN) 0.05 % cream Apply topically 2 (two) times daily. 7-10 days as needed for eczema 30 g 5   levothyroxine (SYNTHROID) 100 MCG tablet Take 1 tablet (100 mcg total) by mouth daily before breakfast. 90 tablet 3   mirtazapine (REMERON) 15 MG tablet TAKE 1 TABLET BY MOUTH EVERYDAY AT BEDTIME 90 tablet 3   SPIRIVA RESPIMAT 2.5 MCG/ACT AERS USE  2 INHALATIONS DAILY (NEED APPOINTMENT FOR FURTHER REFILLS) 12 g 3   traMADol (ULTRAM) 50 MG tablet TAKE 1 TABLET (50 MG TOTAL) BY MOUTH 2 (TWO) TIMES DAILY AS NEEDED FOR MODERATE PAIN OR SEVERE PAIN. 60 tablet 5   rosuvastatin (CRESTOR) 40 MG tablet TAKE 1 TABLET DAILY 90 tablet 3   No current facility-administered medications for this visit.     Objective:  BP 136/74   Pulse 89   Temp 97.7 F (36.5 C)   Ht '5\' 8"'$  (1.727 m)   Wt 156 lb 3.2 oz (70.9 kg)   SpO2 92%   BMI 23.75 kg/m  Gen: NAD, resting comfortably CV: RRR no murmurs rubs or gallops Lungs: CTAB no crackles, wheeze, rhonchi Ext: no edema Skin: warm, dry    Assessment and Plan   #occasional flutters/palpitations-  usually stress related. He also asks about a fib (not feeling today) and offered monitor but he declines. More common in crowds and is feeling more claustrophobic  # CKD IV S: follows with [facility] (prior with Dr. Justin Mend) -previously thought . likely going to go on peritoneal dialysis within 54months of 11/26/2019 per patient report after visit but later filtration rate improved to greater than 30! A/P: last GFR with DR. Patel at 23 overall stable- knows to avoid nsaids- continue close follow up with Dr. PPosey Pronto- not interested in dialysis - not candidate for transplant  # Hypertension S: compliant with atenolol '50mg'$ .  -Off benazepril '20mg'$  due to renal function.  Prior on amlodipine BP Readings from Last 3 Encounters:  09/02/22 136/74  05/03/22 122/70  03/22/22 122/60  A/P: Controlled. Continue current medications.   # COPD  S: Patient reports stable shortness of breath and cough - still very bothersome- uses scooter for going through stores Maintenance medications: spiriva 2.5 mcg 2 puffs daily plus symbicort  Patient has had to use albuterol 2-3 x per day depending on day- uses before activity A/P: imperfect control but stable- continue current meds -has oxygen if needed but not needing  #hyperlipidemia #aortic atherosclerosis S: Medication: rosuvastatin 40 mg Lab Results  Component Value Date   CHOL 96 05/03/2022   HDL 53.10 05/03/2022   LDLCALC 27 05/03/2022   LDLDIRECT 45.0 02/07/2019   TRIG 76.0 05/03/2022   CHOLHDL 2 05/03/2022  A/P: cholesterol looked really good- offered decrease- prefers to maintain  #hypothyroidism S: compliant On thyroid medication- levothyroxine 1052m Lab Results  Component Value Date   TSH 0.76 06/14/2022   A/P:Controlled. Continue current medications.     #Unintentional weight loss- no clear cause on workup other than decreased appetite- trial of mirtazepine 15 mg mild help for appetite but imperfect  -beef, pork still cant eat post  covid- makes nauseous   #MGUS- yearly follow up with hematology- mild anemia Lab Results  Component Value Date   WBC 7.9 05/03/2022   HGB 11.3 (L) 05/03/2022   HCT 35.1 (L) 05/03/2022   MCV 94.7 05/03/2022   PLT 210.0 05/03/2022   # B12 deficiency S: Current treatment/medication (oral vs. IM):  tolerating oral b12 100028mLab Results  Component Value Date   VITAMINB12 1,107 (H) 05/03/2022  A/P: Controlled. Continue current medications.   #with overall health- wants to hold off on colonoscopy- risks may be greater than benefit despite polyp history  Recommended follow up: Return in about 4 months (around 01/03/2023) for followup or sooner if needed.Schedule b4 you leave. Future Appointments  Date Time Provider [place]on/31/2024 12:00 PM [facility]  HEALTH COACH [facility]    Lab/Order associations:   ICD-10-CM   1. COPD, severe ([place])  J44.9     2. Chronic kidney disease (CKD), stage IV (severe) (HCC)  N18.4     3. Coronary artery disease involving coronary bypass graft of native heart without angina pectoris  I25.810     4. Vitamin B12 deficiency  E53.8     5. Hypothyroidism (acquired)  E03.9     6. Hyperlipidemia, unspecified hyperlipidemia type  E78.5     7. Need for immunization against influenza  Z23 Flu Vaccine QUAD High Dose(Fluad)      Meds ordered this encounter  Medications   rosuvastatin (CRESTOR) 40 MG tablet    Sig: TAKE 1 TABLET DAILY    Dispense:  90 tablet    Refill:  3    Return precautions advised.  Garret Reddish, MD

## 2022-09-06 ENCOUNTER — Other Ambulatory Visit: Payer: Self-pay | Admitting: Adult Health

## 2022-09-06 ENCOUNTER — Other Ambulatory Visit: Payer: Self-pay | Admitting: Family Medicine

## 2022-09-13 ENCOUNTER — Encounter: Payer: Self-pay | Admitting: Family Medicine

## 2022-09-29 ENCOUNTER — Ambulatory Visit: Payer: Medicare HMO | Admitting: Student

## 2022-09-29 ENCOUNTER — Encounter: Payer: Self-pay | Admitting: Student

## 2022-09-29 VITALS — BP 148/74 | HR 64 | Ht 68.0 in | Wt 157.0 lb

## 2022-09-29 DIAGNOSIS — J449 Chronic obstructive pulmonary disease, unspecified: Secondary | ICD-10-CM

## 2022-09-29 DIAGNOSIS — R142 Eructation: Secondary | ICD-10-CM

## 2022-09-29 MED ORDER — BREZTRI AEROSPHERE 160-9-4.8 MCG/ACT IN AERO: 2.0000 | INHALATION_SPRAY | Freq: Two times a day (BID) | RESPIRATORY_TRACT | 0 refills | Status: DC

## 2022-09-29 NOTE — Patient Instructions (Addendum)
-   try breztri 2 puffs twice daily (stop symbicort and spiriva while you're on breztri) let me know if you prefer it to symbicort/spiriva - recommend RSV, covid-19 vaccination this fall/winter - see you in 6 months or sooner if need be

## 2022-09-29 NOTE — Progress Notes (Signed)
Synopsis: Referred for COPD, lung cancer screening by [name], MD  Subjective:   PATIENT ID: Brian Graham GENDER: male DOB: 06/09/1951, MRN: [id number]  Chief Complaint  Patient presents with   Follow-up    Breathing is unchanged. He has not used albuterol inhaler in the past wk.    71yM with history of COPD on symbicort  160 and spiriva, pulmonary nodules, CAD s/p CABG, CKD, ET, GERD, MGUS, 45 py smoker quit 2018, covid-19 infection 06/2021, last seen in our clinic 07/2021 at which point  Had nodular density on CXR 07/2021 posst covid infection and was re-referred to lung cancer screening program.   Continued on inhalers and antihistamine started for PND, saline nasal spray  Adherent to inhalers. No trips to the hospital since last visit. Doesn't think he had steroid or ABX course this year. Still a little more dyspneic than normal after covid-19. He does still have post-nasal drainage. Does not use flonase. Not using an antihistamine.   No family history of lung cancer.   Interval HPI:  Says he's doing ok overall. Gets dyspneic occasionally. Like when he's doing yardwork.   Has had a couple episodes of monster burp after which he has some improvement in his dyspnea. He has some reflux but he doesn't take anything for it. Was constipated but now taking miralax with improvement. Some nausea daily.   No hospitalizations since last visit. No courses of steroids or ABX since last visit.    No CP.   Uses fan over face when he has panic attacks.    Otherwise pertinent review of systems is negative.  Past Medical History:  Diagnosis Date   Anxiety    Arthritis    "left knee" (08/05/2016)   Bruises easily    CAD (coronary artery disease) of artery bypass graft 08/05/2016   Around age 71. CABG - 3 vessel.    Chronic kidney disease    Colon polyp    COPD, severe ([place]) 08/23/2016   Alpha 1 studies normal per prior pulmonary group notes Smoked 40 years, quit in 2012  June 2016 PFT from prior pulmonary group: "significant obstruction" FEV1 1.58L (47% pred), Residual volume 171% pred, DLCO 59% pred Simple Spirometry>> 09/15/2016 ratio 42% FEV1 1.02 L / 31%    Emphysema of lung ([place])    Essential tremor 06/11/2016   Plans to see Dr. Carles Collet   Former smoker 09/01/2016   Quit 2012. 40 pack years at least.    GERD (gastroesophageal reflux disease)    Headache    History of blood transfusion 08/1997   "when he had his heart surgery"   History of shingles 1970-2013 X 3   HTN (hypertension) 08/05/2016   Amlodipine '5mg'$ , atenolol '50mg'$  BID, benazepril '20mg'$    Hyperlipemia    Hypothyroidism    Lumbar disc disease    MGUS (monoclonal gammopathy of unknown significance)    Myocardial infarction ([place]) 08/1997   Peripheral vascular disease (HCC)    PONV (postoperative nausea and vomiting)    Urinary tract infection 09/03/2017   Wears glasses      Family History  Problem Relation Age of Onset   Alcohol abuse Mother    Alcohol abuse Father        not involved in life   Alcoholism Sister    Healthy Sister    Healthy Sister    Allergic rhinitis Neg Hx    Angioedema Neg Hx    Asthma Neg Hx    Eczema Neg  Hx    Immunodeficiency Neg Hx    Urticaria Neg Hx      Past Surgical History:  Procedure Laterality Date   AORTOGRAM Right 08/31/2017   Procedure: AORTOGRAM BILATERAL PELVIC ANGIOGRAM WITH LEFT ILIAC ARTERY STENT;  Surgeon: Conrad Short Hills, MD;  Location: [facility];  Service: Vascular;  Laterality: Right;   BACK SURGERY     CARDIAC CATHETERIZATION  08/1997   COLONOSCOPY  2014   CORONARY ARTERY BYPASS GRAFT  09/12/1997   "triple"   ESOPHAGOGASTRODUODENOSCOPY     INGUINAL HERNIA REPAIR Right [place] Left 1960s - 1974 X 4   LAPAROSCOPIC ABLATION RENAL MASS  ~ 2014   "large abscess/pus ball"   [place]   "they were wedged in the bowel"   [place] Left ~  1965   "removed knee cap"   ROBOT ASSISTED LAPAROSCOPIC NEPHRECTOMY Left 10/07/2017   Procedure: XI ROBOTIC ASSISTED LAPAROSCOPIC NEPHRECTOMY OF PELVIC KIDNEY;  Surgeon: Alexis Frock, MD;  Location: [facility];  Service: Urology;  Laterality: Left;  Place robot patient tower at foot of bed like prostate per Dr. Tresa Moore. Pull extra staple loads.   TONSILLECTOMY      Social History   Socioeconomic History   Marital status: Married    Spouse name: Not on file   Number of children: 2   Years of education: 12   Highest education level: Not on file  Occupational History   Occupation: Retired   Tobacco Use   Smoking status: Former    Packs/day: 1.00    Years: 45.00    Total pack years: 45.00    Types: Cigarettes    Quit date: 07/10/2017    Years since quitting: 5.2   Smokeless tobacco: Never  Vaping Use   Vaping Use: Never used  Substance and Sexual Activity   Alcohol use: Yes    Alcohol/week: 14.0 standard drinks of alcohol    Types: 14 Shots of liquor per week    Comment: daily 2 shots of vodka   Drug use: No   Sexual activity: Not Currently  Other Topics Concern   Not on file  Social History Narrative   Lives with wife. 2 adopted daughters from [place].       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         Former smoker no drugs 2 shots of vodka daily   Right handed    Tea daily   Social Determinants of Health   Financial Resource Strain: Low Risk  (04/12/2022)   Overall Financial Resource Strain (CARDIA)    Difficulty of Paying Living Expenses: Not hard at all  Food Insecurity: No Food Insecurity (04/12/2022)   Hunger Vital Sign    Worried About Running Out of Food in the Last Year: Never true    Ran Out of Food in the Last Year: Never true  Transportation Needs: No Transportation Needs (04/12/2022)   PRAPARE - Hydrologist (Medical): No    Lack of Transportation (Non-Medical): No   Physical Activity: Inactive (04/12/2022)   Exercise Vital Sign    Days of Exercise per Week: 0 days    Minutes of Exercise per Session: 0 min  Stress: No Stress Concern Present (04/12/2022)   [place]  Stress Questionnaire    Feeling of Stress : Not at all  Social Connections: Moderately Isolated (04/12/2022)   Social Connection and Isolation Panel [NHANES]    Frequency of Communication with Friends and Family: Never    Frequency of Social Gatherings with Friends and Family: More than three times a week    Attends Religious Services: Never    Marine scientist or Organizations: No    Attends Archivist Meetings: Never    Marital Status: Married  Human resources officer Violence: Not At Risk (04/12/2022)   Humiliation, Afraid, Rape, and Kick questionnaire    Fear of Current or Ex-Partner: No    Emotionally Abused: No    Physically Abused: No    Sexually Abused: No     Allergies  Allergen Reactions   Contrast Media [Iodinated Contrast Media] Other (See Comments)    NOT ALLOWED DUE TO KIDNEY ISSUES.   Other Itching and Rash    Gel used for ultrasound SEVERELY broke out the skin    Keflex [Cephalexin] Diarrhea, Nausea Only and Other (See Comments)    Insomnia   Sulfa Antibiotics Other (See Comments)    Insomnia   Ciprofloxacin Diarrhea   Levaquin [Levofloxacin] Other (See Comments)    Insomnia    Omeprazole Diarrhea     Outpatient Medications Prior to Visit  Medication Sig Dispense Refill   acetaminophen (TYLENOL) 500 MG tablet Take 500-1,000 mg by mouth every 6 (six) hours as needed (for pain).      albuterol (PROVENTIL) (2.5 MG/3ML) 0.083% nebulizer solution TAKE 3 MLS (2.5 MG TOTAL) BY NEBULIZATION EVERY 6 (SIX) HOURS AS NEEDED FOR WHEEZING OR SHORTNESS OF BREATH. J44.9 300 mL 11   albuterol (VENTOLIN HFA) 108 (90 Base) MCG/ACT inhaler USE 2 INHALATIONS EVERY 4 HOURS AS NEEDED 25.5 g 2   aspirin EC 81 MG tablet Take 81 mg  by mouth daily.     atenolol (TENORMIN) 50 MG tablet TAKE 1 TABLET DAILY 90 tablet 3   budesonide-formoterol (SYMBICORT) 160-4.5 MCG/ACT inhaler USE 2 INHALATIONS TWICE A DAY 30.6 g 3   desonide (DESOWEN) 0.05 % cream Apply topically 2 (two) times daily. 7-10 days as needed for eczema 30 g 5   levothyroxine (SYNTHROID) 100 MCG tablet Take 1 tablet (100 mcg total) by mouth daily before breakfast. 90 tablet 3   mirtazapine (REMERON) 15 MG tablet TAKE 1 TABLET BY MOUTH EVERYDAY AT BEDTIME 90 tablet 3   rosuvastatin (CRESTOR) 40 MG tablet TAKE 1 TABLET DAILY (NEED APPOINTMENT) 90 tablet 3   SPIRIVA RESPIMAT 2.5 MCG/ACT AERS USE 2 INHALATIONS DAILY (NEED APPOINTMENT FOR FURTHER REFILLS) 12 g 3   traMADol (ULTRAM) 50 MG tablet TAKE 1 TABLET (50 MG TOTAL) BY MOUTH 2 (TWO) TIMES DAILY AS NEEDED FOR MODERATE PAIN OR SEVERE PAIN. 60 tablet 5   No facility-administered medications prior to visit.       Objective:   Physical Exam:  General appearance: 71 y.o., male, NAD, conversant  Eyes: anicteric sclerae; PERRL, tracking appropriately HENT: NCAT; MMM Neck: Trachea midline; no lymphadenopathy, no JVD Lungs: Diminished bilaterally, no crackles, no wheeze, with normal respiratory effort CV: RRR, no murmur  Abdomen: Soft, non-tender; non-distended, BS present  Extremities: No peripheral edema, warm Skin: Normal turgor and texture; no rash Psych: Appropriate affect Neuro: Alert and oriented to person and place, no focal deficit     Vitals:   09/29/22 1015  BP: (!) 148/74  Pulse: 64  SpO2: 94%  Weight: 157 lb (  71.2 kg)  Height: '5\' 8"'$  (1.727 m)   94% on RA BMI Readings from Last 3 Encounters:  09/29/22 23.87 kg/m  09/02/22 23.75 kg/m  05/03/22 24.18 kg/m   Wt Readings from Last 3 Encounters:  09/29/22 157 lb (71.2 kg)  09/02/22 156 lb 3.2 oz (70.9 kg)  05/03/22 159 lb (72.1 kg)     CBC    Component Value Date/Time   WBC 7.9 05/03/2022 1047   RBC 3.70 (L) 05/03/2022 1047    HGB 11.3 (L) 05/03/2022 1047   HGB 10.2 (L) 02/14/2020 0833   HCT 35.1 (L) 05/03/2022 1047   PLT 210.0 05/03/2022 1047   PLT 217 02/14/2020 0833   MCV 94.7 05/03/2022 1047   MCH 28.8 07/14/2021 1626   MCHC 32.3 05/03/2022 1047   RDW 15.0 05/03/2022 1047   LYMPHSABS 1.2 05/03/2022 1047   MONOABS 0.8 05/03/2022 1047   EOSABS 0.5 05/03/2022 1047   BASOSABS 0.0 05/03/2022 1047     Chest Imaging:  CXR 10/30/21 reviewed by me - agree can't clearly see nodular opacity noted on prior exam  CT Chest 09/2019 reviewed by me remarkable for stable to decreased size of nodules,  04/2021 normal swallow  MRI c-spine 12/2020 severe right and moderate left foraminal stenosis c5-6  LDCT 01/20/22 reviewed by me stable nodules, emphysema and bronchial wall thickening  Pulmonary Functions Testing Results:     No data to display          Echocardiogram:   Stress echo 2014 EF 55%, normal exercise response      Assessment & Plan:   # COPD gold functional group B  # History of smoking # small, stable pulmonary nodules Doing lung cancer screening program  # Chronic nausea # belching, monster burps Nausea has been attributed to CKD. Offered trial of anti reflux medication but he declines, opts to wait to discuss with GI.  Plan: - lung cancer screening re-referral placed today - can try breztri 2 puffs twice daily - hold symbicort/spiriva while on it - instructed to take his shower 20-30 min after morning or evening controller inhaler dose as humidity typically causes him some increased dyspnea - up to date with flu, encouraged to get RSV vaccine which he plans to do - ppsv23 at some point between 2022 and 2027 (has already had PCV13 and ppsv23 2017)    Requests follow up with Dr. Lake Bells in 6 months  Maryjane Hurter, MD [facility] 09/29/2022 10:20 AM

## 2022-11-10 ENCOUNTER — Encounter: Payer: Self-pay | Admitting: Student

## 2022-11-10 NOTE — Telephone Encounter (Signed)
Mychart message sent by pt: Brian Graham "Bill"  P [facility] (supporting Maryjane Hurter, MD)1 hour ago (2:27 PM)    Dr Verlee Monte, I received a letter from [facility] stating that on Jan 1st 2024 Symbicort will become a non preferred medication. They have given me the option to take Budesonide - Formoterol Fumarate Breyna. Is this something you would approve of or should I make an appointment with you to discuss? If this is approved by you they are asking that you send in a new prescription for the new medication. Thanks for your time. Brian Graham     Dr. Verlee Monte, please advise.

## 2022-11-18 MED ORDER — BUDESONIDE-FORMOTEROL FUMARATE 160-4.5 MCG/ACT IN AERO: 2.0000 | INHALATION_SPRAY | Freq: Two times a day (BID) | RESPIRATORY_TRACT | 12 refills | Status: DC

## 2022-11-18 NOTE — Telephone Encounter (Signed)
Prescription sent to [facility] on [address] [facility] breyna. No need to schedule appt.

## 2022-11-18 NOTE — Telephone Encounter (Signed)
Pt sent another mychart message checking on this. Dr. Verlee Monte, please advise.

## 2023-01-04 ENCOUNTER — Encounter: Payer: Self-pay | Admitting: Family Medicine

## 2023-01-04 ENCOUNTER — Ambulatory Visit (INDEPENDENT_AMBULATORY_CARE_PROVIDER_SITE_OTHER): Payer: Medicare HMO | Admitting: Family Medicine

## 2023-01-04 VITALS — BP 130/64 | HR 73 | Temp 97.8°F | Ht 68.0 in | Wt 156.8 lb

## 2023-01-04 DIAGNOSIS — Z1211 Encounter for screening for malignant neoplasm of colon: Secondary | ICD-10-CM

## 2023-01-04 DIAGNOSIS — I10 Essential (primary) hypertension: Secondary | ICD-10-CM | POA: Diagnosis not present

## 2023-01-04 DIAGNOSIS — N184 Chronic kidney disease, stage 4 (severe): Secondary | ICD-10-CM

## 2023-01-04 DIAGNOSIS — D692 Other nonthrombocytopenic purpura: Secondary | ICD-10-CM

## 2023-01-04 DIAGNOSIS — J449 Chronic obstructive pulmonary disease, unspecified: Secondary | ICD-10-CM

## 2023-01-04 DIAGNOSIS — E039 Hypothyroidism, unspecified: Secondary | ICD-10-CM

## 2023-01-04 DIAGNOSIS — E785 Hyperlipidemia, unspecified: Secondary | ICD-10-CM

## 2023-01-04 NOTE — Patient Instructions (Addendum)
#  Exercise- not currently exercising/moving- he feels "lazy" and we set a goal together 3 days a week of walking or stationary bike for at least 5 minutes through the end of march . Reach out to me and give me an update in early April with your progress  please let us know if you have not received your Cologuard within 3 weeks-please complete after you receive  Recommended follow up: Return for next already scheduled visit or sooner if needed.

## 2023-01-04 NOTE — Progress Notes (Signed)
Phone [phone] In person visit   Subjective:   Alias Grahn is a 72 y.o. year old very pleasant male patient who presents for/with See problem oriented charting Chief Complaint  Patient presents with   Follow-up    (NO MASK).   Hypertension    Past Medical History-  Patient Active Problem List   Diagnosis Date Noted   Chronic kidney disease (CKD), stage IV (severe) ([place]) 08/13/2019    Priority: High   S/p nephrectomy 12/05/2018    Priority: High   MGUS (monoclonal gammopathy of unknown significance) 08/02/2018    Priority: High   Osteoporosis 06/26/2018    Priority: High   Atherosclerosis of native arteries of extremity with intermittent claudication ([place]) 07/06/2017    Priority: High   Weight loss 04/23/2017    Priority: High   Former smoker 09/01/2016    Priority: High   COPD, severe ([place]) 08/23/2016    Priority: High   Hx of CABG 08/05/2016    Priority: High   CAD (coronary artery disease) of artery bypass graft 08/05/2016    Priority: High   Vitamin D deficiency 01/20/2021    Priority: Medium    Vitamin B12 deficiency 01/20/2021    Priority: Medium    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    Priority: Medium    History of adenomatous polyp of colon 04/22/2017    Priority: Medium    Hyperlipidemia 04/04/2017    Priority: Medium    Hypothyroidism (acquired) 04/04/2017    Priority: Medium    Essential hypertension 08/05/2016    Priority: Medium    Essential tremor 06/11/2016    Priority: Medium    Perennial allergic rhinitis 01/24/2018    Priority: Low   Anxiety 07/24/2017    Priority: Low   Pyelonephritis 04/23/2017    Priority: Low   Aortic atherosclerosis ([place]) 04/13/2017    Priority: Low   Seasonal allergies 04/04/2017    Priority: Low   Myalgia 10/04/2016    Priority: Low   Dysuria 08/23/2016    Priority: Low   Chronic pain of both knees 08/23/2016    Priority: Low   Tension headache 06/11/2016    Priority: Low   Senile  purpura ([place]) 05/03/2022   Hearing abnormally acute, left 04/03/2020   Healthcare maintenance 04/03/2020   Constipation 09/03/2017    Medications- reviewed and updated Current Outpatient Medications  Medication Sig Dispense Refill   acetaminophen (TYLENOL) 500 MG tablet Take 500-1,000 mg by mouth every 6 (six) hours as needed (for pain).      albuterol (PROVENTIL) (2.5 MG/3ML) 0.083% nebulizer solution TAKE 3 MLS (2.5 MG TOTAL) BY NEBULIZATION EVERY 6 (SIX) HOURS AS NEEDED FOR WHEEZING OR SHORTNESS OF BREATH. J44.9 300 mL 11   albuterol (VENTOLIN HFA) 108 (90 Base) MCG/ACT inhaler USE 2 INHALATIONS EVERY 4 HOURS AS NEEDED 25.5 g 2   aspirin EC 81 MG tablet Take 81 mg by mouth daily.     atenolol (TENORMIN) 50 MG tablet TAKE 1 TABLET DAILY 90 tablet 3   budesonide-formoterol (BREYNA) 160-4.5 MCG/ACT inhaler Inhale 2 puffs into the lungs 2 (two) times daily. 1 each 12   desonide (DESOWEN) 0.05 % cream Apply topically 2 (two) times daily. 7-10 days as needed for eczema 30 g 5   levothyroxine (SYNTHROID) 100 MCG tablet Take 1 tablet (100 mcg total) by mouth daily before breakfast. 90 tablet 3   mirtazapine (REMERON) 15 MG tablet TAKE 1 TABLET BY MOUTH EVERYDAY AT BEDTIME 90 tablet 3  rosuvastatin (CRESTOR) 40 MG tablet TAKE 1 TABLET DAILY (NEED APPOINTMENT) 90 tablet 3   SPIRIVA RESPIMAT 2.5 MCG/ACT AERS USE 2 INHALATIONS DAILY (NEED APPOINTMENT FOR FURTHER REFILLS) 12 g 3   traMADol (ULTRAM) 50 MG tablet TAKE 1 TABLET (50 MG TOTAL) BY MOUTH 2 (TWO) TIMES DAILY AS NEEDED FOR MODERATE PAIN OR SEVERE PAIN. 60 tablet 5   No current facility-administered medications for this visit.     Objective:  BP 130/64   Pulse 73   Temp 97.8 F (36.6 C)   Ht 5' 8"$  (1.727 m)   Wt 156 lb 12.8 oz (71.1 kg)   SpO2 93%   BMI 23.84 kg/m  Gen: NAD, resting comfortably CV: RRR no murmurs rubs or gallops Lungs: CTAB no crackles, wheeze, rhonchi Ext: no edema Skin: warm, dry     Assessment and Plan    # Palpitations-last visit patient was doing with some palpitations-we offered to do cardiac monitoring but he declined at that time -Today he reports these are somewhat better- wants to monitor  #Exercise- not currently exercising/moving- he feels "lazy" and we set a goal together 3 days a week of walking or stationary bike for at least 5 minutes through the end of march . Reach out to me and give me an update in early April with your progress -is at least back into woodworking  #lung cancer screening- is scheduled for follow up   # CKD IV S: follows with [facility] (prior with Dr. Justin Mend- now Dr. Posey Pronto) -previously thought . likely going to go on peritoneal dialysis within 6 months of 11/26/2019 per patient report after visit but later filtration rate improved to greater than 30! before dipping back down into 20s - not interested in dialysis at this point - not candidate for transplant -unilateral kidney functioning A/P: GFR was 23 in December with Dr. Posey Pronto- overall stable- and sees in April   # Hypertension-also with essential tremor S: compliant with atenolol 42m.  -Off benazepril 273mdue to renal function.  Prior on amlodipine BP Readings from Last 3 Encounters:  01/04/23 130/64  09/29/22 (!) 148/74  09/02/22 136/74   A/P:stable- continue current medicines   # COPD-follows with pulmonology Dr. MeGlenetta HewPatient reports stable shortness of breath and cough still very bothersome- uses scooter for going through stores- but have encouraged more mobility Maintenance medications: spiriva 2.5 mcg 2 puffs daily and symbicort Patient has had to use albuterol 2-3 x per day.  A/P: COPD stable- continue current medicines   #hyperlipidemia #aortic atherosclerosis S: Medication: rosuvastatin 40 mg  Lab Results  Component Value Date   CHOL 96 05/03/2022   HDL 53.10 05/03/2022   LDLCALC 27 05/03/2022   LDLDIRECT 45.0 02/07/2019   TRIG 76.0 05/03/2022   CHOLHDL 2 05/03/2022     A/P: Lipids- stable- continue current medicines  Aortic atherosclerosis (presumed stable)- LDL goal ideally <70 - update lipids next visit- for now continue current medications   #hypothyroidism S: compliant On thyroid medication- levothyroxine 10065m Lab Results  Component Value Date   TSH 0.76 06/14/2022  A/P:stable- continue current medicines    #Dietary changes-beef, pork still cant eat post covid- makes nauseous   #MGUS-  follow up with hematology early on - wants to hold off on further workup unless worsens Lab Results  Component Value Date   WBC 7.9 05/03/2022   HGB 11.3 (L) 05/03/2022   HCT 35.1 (L) 05/03/2022   MCV 94.7 05/03/2022   PLT 210.0 05/03/2022   #  senile purpura- noted. Stable. Check cbc at least annually- he wants to wait for labs on next visit  #colon cancer screening- opts in for cologuard  Recommended follow up: Return for next already scheduled visit or sooner if needed. Future Appointments  Date Time Provider [place]  01/19/2023 11:30 AM [facility] 2 [facility] [place]  04/22/2023 12:00 PM [facility]  05/09/2023  9:00 AM [name], MD [facility]    Lab/Order associations:   ICD-10-CM   1. Screen for colon cancer  Z12.11 Cologuard    2. COPD, group B, by GOLD 2017 classification ([place]) Chronic J44.9     3. Chronic kidney disease (CKD), stage IV (severe) ([facility]) Chronic N18.4     4. Senile purpura ([facility]) Chronic D69.2     5. Essential hypertension  I10     6. Hyperlipidemia, unspecified hyperlipidemia type  E78.5     7. Hypothyroidism (acquired)  E03.9       No orders of the defined types were placed in this encounter.   Return precautions advised.  Garret Reddish, MD

## 2023-01-17 ENCOUNTER — Other Ambulatory Visit: Payer: Self-pay | Admitting: *Deleted

## 2023-01-17 DIAGNOSIS — I739 Peripheral vascular disease, unspecified: Secondary | ICD-10-CM

## 2023-01-17 DIAGNOSIS — I745 Embolism and thrombosis of iliac artery: Secondary | ICD-10-CM

## 2023-01-19 ENCOUNTER — Ambulatory Visit (HOSPITAL_COMMUNITY)
Admission: RE | Admit: 2023-01-19 | Discharge: 2023-01-19 | Disposition: A | Discharge status: Home / Self Care | Payer: Medicare HMO | Source: Ambulatory Visit | Attending: Acute Care | Admitting: Acute Care

## 2023-01-19 DIAGNOSIS — Z122 Encounter for screening for malignant neoplasm of respiratory organs: Secondary | ICD-10-CM | POA: Insufficient documentation

## 2023-01-19 DIAGNOSIS — Z87891 Personal history of nicotine dependence: Secondary | ICD-10-CM

## 2023-01-19 DIAGNOSIS — J439 Emphysema, unspecified: Secondary | ICD-10-CM | POA: Insufficient documentation

## 2023-01-19 DIAGNOSIS — I700 Atherosclerosis of aorta: Secondary | ICD-10-CM | POA: Diagnosis not present

## 2023-01-20 ENCOUNTER — Other Ambulatory Visit: Payer: Self-pay | Admitting: Family Medicine

## 2023-01-21 ENCOUNTER — Other Ambulatory Visit: Payer: Self-pay

## 2023-01-21 DIAGNOSIS — Z122 Encounter for screening for malignant neoplasm of respiratory organs: Secondary | ICD-10-CM

## 2023-01-21 DIAGNOSIS — Z87891 Personal history of nicotine dependence: Secondary | ICD-10-CM

## 2023-01-24 ENCOUNTER — Ambulatory Visit: Payer: Medicare HMO | Admitting: Physician Assistant

## 2023-01-24 ENCOUNTER — Ambulatory Visit (HOSPITAL_COMMUNITY)
Admission: RE | Admit: 2023-01-24 | Discharge: 2023-01-24 | Disposition: A | Discharge status: Home / Self Care | Payer: Medicare HMO | Source: Ambulatory Visit | Attending: Surgery | Admitting: Surgery

## 2023-01-24 VITALS — BP 162/86 | HR 69 | Temp 97.8°F | Ht 69.0 in | Wt 155.0 lb

## 2023-01-24 DIAGNOSIS — I739 Peripheral vascular disease, unspecified: Secondary | ICD-10-CM

## 2023-01-24 DIAGNOSIS — I745 Embolism and thrombosis of iliac artery: Secondary | ICD-10-CM | POA: Diagnosis present

## 2023-01-24 NOTE — Progress Notes (Signed)
Office Note     CC:  follow up Requesting Provider:  [name], MD  HPI: Brian Graham is a 72 y.o. (04-06-1951) male who presents for routine follow up of PAD. He has history of left iliac artery stenting by Dr. Bridgett Larsson in 2018 for disabling claudication. Following the stent placement he had resolution of his symptoms without recurrence. Today he denies any pain on ambulation or rest, no tissue loss. He is compliant with his Aspirin and statin. He explains that he walks fairly well but has trouble with his knees. Has history of multiple knee surgeries, specifically on the left between 1960-1970.   The pt is on a statin for cholesterol management.  The pt is on a daily aspirin.   Other AC:  none The pt is on BB for hypertension.   The pt is not diabetic Tobacco hx:  former  Past Medical History:  Diagnosis Date   Anxiety    Arthritis    "left knee" (08/05/2016)   Bruises easily    CAD (coronary artery disease) of artery bypass graft 08/05/2016   Around age 70. CABG - 3 vessel.    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 Carles Collet   Former smoker 09/01/2016   Quit 2012. 40 pack years at least.    GERD (gastroesophageal reflux disease)    Headache    History of blood transfusion 08/1997   "when he had his heart surgery"   History of shingles 1970-2013 X 3   HTN (hypertension) 08/05/2016   Amlodipine '5mg'$ , atenolol '50mg'$  BID, benazepril '20mg'$    Hyperlipemia    Hypothyroidism    Lumbar disc disease    MGUS (monoclonal gammopathy of unknown significance)    Myocardial infarction ([place]) 08/1997    Peripheral vascular disease (H[facility])    PONV (postoperative nausea and vomiting)    Urinary tract infection 09/03/2017   Wears glasses     Past Surgical History:  Procedure Laterality Date   AORTOGRAM Right 08/31/2017   Procedure: AORTOGRAM BILATERAL PELVIC ANGIOGRAM WITH LEFT ILIAC ARTERY STENT;  Surgeon: Conrad Pedro Bay, MD;  Location: [facility];  Service: Vascular;  Laterality: Right;   BACK SURGERY     CARDIAC CATHETERIZATION  08/1997   COLONOSCOPY  2014   CORONARY ARTERY BYPASS GRAFT  09/12/1997   "triple"   ESOPHAGOGASTRODUODENOSCOPY     INGUINAL HERNIA REPAIR Right [place] Left 1960s - 1974 X 4   LAPAROSCOPIC ABLATION RENAL MASS  ~ 2014   "large abscess/pus ball"   [place]   "they were wedged in the bowel"   [place] Left ~ 1965   "removed knee cap"   ROBOT ASSISTED LAPAROSCOPIC NEPHRECTOMY Left 10/07/2017   Procedure: XI ROBOTIC ASSISTED LAPAROSCOPIC NEPHRECTOMY OF PELVIC KIDNEY;  Surgeon: Alexis Frock, MD;  Location: [facility];  Service: Urology;  Laterality: Left;  Place robot patient tower at foot of bed like prostate per Dr. Tresa Moore. Pull extra staple  loads.   TONSILLECTOMY      Social History   Socioeconomic History   Marital status: Married    Spouse name: Not on file   Number of children: 2   Years of education: 12   Highest education level: Not on file  Occupational History   Occupation: Retired   Tobacco Use   Smoking status: Former    Packs/day: 1.00    Years: 45.00    Total pack years: 45.00    Types: Cigarettes    Quit date: 07/10/2017    Years since quitting: 5.5   Smokeless tobacco: Never  Vaping Use   Vaping Use: Never used  Substance and Sexual Activity   Alcohol use: Yes    Alcohol/week: 14.0 standard drinks of alcohol    Types: 14 Shots of liquor per week    Comment: daily 2 shots of vodka   Drug use: No   Sexual activity: Not Currently   Other Topics Concern   Not on file  Social History Narrative   Lives with wife. 2 adopted daughters from [place].       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         Former smoker no drugs 2 shots of vodka daily   Right handed    Tea daily   Social Determinants of Health   Financial Resource Strain: Low Risk  (04/12/2022)   Overall Financial Resource Strain (CARDIA)    Difficulty of Paying Living Expenses: Not hard at all  Food Insecurity: No Food Insecurity (04/12/2022)   Hunger Vital Sign    Worried About Running Out of Food in the Last Year: Never true    Ran Out of Food in the Last Year: Never true  Transportation Needs: No Transportation Needs (04/12/2022)   PRAPARE - Hydrologist (Medical): No    Lack of Transportation (Non-Medical): No  Physical Activity: Inactive (04/12/2022)   Exercise Vital Sign    Days of Exercise per Week: 0 days    Minutes of Exercise per Session: 0 min  Stress: No Stress Concern Present (04/12/2022)   [place]    Feeling of Stress : Not at all  Social Connections: Moderately Isolated (04/12/2022)   Social Connection and Isolation Panel [NHANES]    Frequency of Communication with Friends and Family: Never    Frequency of Social Gatherings with Friends and Family: More than three times a week    Attends Religious Services: Never    Marine scientist or Organizations: No    Attends Archivist Meetings: Never    Marital Status: Married  Human resources officer Violence: Not At Risk (04/12/2022)   Humiliation, Afraid, Rape, and Kick questionnaire    Fear of Current or Ex-Partner: No    Emotionally Abused: No    Physically Abused: No    Sexually Abused: No    Family History  Problem Relation Age of Onset   Alcohol abuse Mother    Alcohol abuse Father        not involved in life    Alcoholism Sister    Healthy Sister    Healthy Sister    Allergic rhinitis Neg Hx    Angioedema Neg Hx    Asthma Neg Hx    Eczema Neg Hx    Immunodeficiency Neg Hx    Urticaria Neg Hx  Current Outpatient Medications  Medication Sig Dispense Refill   acetaminophen (TYLENOL) 500 MG tablet Take 500-1,000 mg by mouth every 6 (six) hours as needed (for pain).      albuterol (PROVENTIL) (2.5 MG/3ML) 0.083% nebulizer solution TAKE 3 MLS (2.5 MG TOTAL) BY NEBULIZATION EVERY 6 (SIX) HOURS AS NEEDED FOR WHEEZING OR SHORTNESS OF BREATH. J44.9 300 mL 11   albuterol (VENTOLIN HFA) 108 (90 Base) MCG/ACT inhaler USE 2 INHALATIONS EVERY 4 HOURS AS NEEDED 25.5 g 2   aspirin EC 81 MG tablet Take 81 mg by mouth daily.     atenolol (TENORMIN) 50 MG tablet TAKE 1 TABLET DAILY 90 tablet 3   budesonide-formoterol (BREYNA) 160-4.5 MCG/ACT inhaler Inhale 2 puffs into the lungs 2 (two) times daily. 1 each 12   desonide (DESOWEN) 0.05 % cream Apply topically 2 (two) times daily. 7-10 days as needed for eczema 30 g 5   levothyroxine (SYNTHROID) 100 MCG tablet Take 1 tablet (100 mcg total) by mouth daily before breakfast. 90 tablet 3   mirtazapine (REMERON) 15 MG tablet TAKE 1 TABLET BY MOUTH EVERYDAY AT BEDTIME 90 tablet 3   rosuvastatin (CRESTOR) 40 MG tablet TAKE 1 TABLET DAILY (NEED APPOINTMENT) 90 tablet 3   SPIRIVA RESPIMAT 2.5 MCG/ACT AERS USE 2 INHALATIONS DAILY (NEED APPOINTMENT FOR FURTHER REFILLS) 12 g 3   traMADol (ULTRAM) 50 MG tablet TAKE 1 TABLET (50 MG TOTAL) BY MOUTH 2 (TWO) TIMES DAILY AS NEEDED FOR MODERATE PAIN OR SEVERE PAIN. 60 tablet 5   No current facility-administered medications for this visit.    Allergies  Allergen Reactions   Contrast Media [Iodinated Contrast Media] Other (See Comments)    NOT ALLOWED DUE TO KIDNEY ISSUES.   Other Itching and Rash    Gel used for ultrasound SEVERELY broke out the skin    Keflex [Cephalexin] Diarrhea, Nausea Only and Other (See Comments)     Insomnia   Sulfa Antibiotics Other (See Comments)    Insomnia   Ciprofloxacin Diarrhea   Levaquin [Levofloxacin] Other (See Comments)    Insomnia    Omeprazole Diarrhea     REVIEW OF SYSTEMS:   '[X]'$  denotes positive finding, '[ ]'$  denotes negative finding Cardiac  Comments:  Chest pain or chest pressure:    Shortness of breath upon exertion:    Short of breath when lying flat:    Irregular heart rhythm:        Vascular    Pain in calf, thigh, or hip brought on by ambulation:    Pain in feet at night that wakes you up from your sleep:     Blood clot in your veins:    Leg swelling:         Pulmonary    Oxygen at home:    Productive cough:     Wheezing:         Neurologic    Sudden weakness in arms or legs:     Sudden numbness in arms or legs:     Sudden onset of difficulty speaking or slurred speech:    Temporary loss of vision in one eye:     Problems with dizziness:         Gastrointestinal    Blood in stool:     Vomited blood:         Genitourinary    Burning when urinating:     Blood in urine:        Psychiatric    Major depression:  Hematologic    Bleeding problems:    Problems with blood clotting too easily:        Skin    Rashes or ulcers:        Constitutional    Fever or chills:      PHYSICAL EXAMINATION:  Vitals:   01/24/23 0917  BP: (!) 162/86  Pulse: 69  Temp: 97.8 F (36.6 C)  TempSrc: Temporal  SpO2: 92%  Weight: 155 lb (70.3 kg)  Height: '5\' 9"'$  (1.753 m)    General:  WDWN in NAD; vital signs documented above Gait: Normal HENT: WNL, normocephalic Pulmonary: normal non-labored breathing , without  wheezing Cardiac: regular HR, without  Murmurs without carotid bruit Abdomen: soft, NT, no masses Vascular Exam/Pulses:  Right Left  Radial 2+ (normal) 2+ (normal)  Femoral 2+ (normal) 2+ (normal)  Popliteal Not palpable Not palpable  DP 2+ (normal) 2+ (normal)  PT 1+ (weak) 1+ (weak)   Extremities: without ischemic  changes, without Gangrene , without cellulitis; without open wounds;  Musculoskeletal: no muscle wasting or atrophy  Neurologic: A&O X 3;  No focal weakness or paresthesias are detected Psychiatric:  The pt has Normal affect.   Non-Invasive Vascular Imaging:    Abdominal Aorta Findings:  +-----------+-------+----------+----------+----------------+--------+------  --+  Location  AP (cm)Trans (cm)PSV (cm/s)Waveform         ThrombusComments  +-----------+-------+----------+----------+----------------+--------+------  --+  Proximal  2.35             45        biphasic                           +-----------+-------+----------+----------+----------------+--------+------  --+  Mid       1.54             145       turbulent                          +-----------+-------+----------+----------+----------------+--------+------  --+  Distal    1.31             216       turbulent                          +-----------+-------+----------+----------+----------------+--------+------  --+  LT CIA Prox                 165       blunted biphasic                   +-----------+-------+----------+----------+----------------+--------+------  --+   Visualization of the Proximal Abdominal Aorta, Mid Abdominal Aorta, Distal  Abdominal Aorta, Right CIA Proximal artery, Left CIA Proximal artery, Left CIA Mid artery and Left CIA Distal artery was limited. Extremely technically difficult and limited due to extensive bowel gas. PT reports NPO since 5pm last night, taking Gas X, and recent consitpation.    Summary:  Abdominal Aorta: The largest aortic measurement is 2.4 cm.  Stenosis: +------------+-------------+  Location    Stenosis       +------------+-------------+  Mid Aorta   >50% stenosis  +------------+-------------+  Distal Aorta>50% stenosis  +------------+-------------+   Incidental Finding: Extrahepatic CBD dilated 0.82cm. further evaluation is  limited due to bowel gas. There is a small hyper echoic structure in the gallbladder. GB wall is WNL at 0.16cm. No sonographic murphys sign present.    ASSESSMENT/PLAN:: 72 y.o. male  here for follow up for PAD. He has history of left iliac artery stenting by Dr. Bridgett Larsson in 2018 for disabling claudication. Following the stent placement he had resolution of his symptoms without recurrence. Today he denies any pain on ambulation or rest, no tissue loss. Duplex today shows some turbulence in the distal aorta but velocities otherwise similar to prior study. Some limited visualization of stent due to bowel gas. No concerns at this time for in stent restenosis.  - Continue aspirin and statin - Patient and his wife know to follow up sooner if any new or concerning symptoms - He will return in 1 year with repeat Aorto/iliac duplex and ABIs   Karoline Caldwell, PA-C [facility] [phone]  Clinic MD:   Trula Slade

## 2023-03-02 ENCOUNTER — Other Ambulatory Visit: Payer: Self-pay | Admitting: Family Medicine

## 2023-03-07 ENCOUNTER — Encounter: Payer: Self-pay | Admitting: Family Medicine

## 2023-03-08 ENCOUNTER — Other Ambulatory Visit: Payer: Self-pay | Admitting: Student

## 2023-03-16 ENCOUNTER — Telehealth: Payer: Self-pay | Admitting: Family Medicine

## 2023-03-16 NOTE — Telephone Encounter (Signed)
Contacted Bosie Helper [facility] to schedule their annual wellness visit. Appointment made for 03/24/2023.  [facility] AW[facility] TEAM Direct Dial [phone]

## 2023-03-27 NOTE — Progress Notes (Unsigned)
Synopsis: Referred for COPD, lung cancer screening by Shelva Majestic, MD  Subjective:   PATIENT ID: Brian Graham GENDER: male DOB: 1951-08-27, MRN: [id number]  No chief complaint on file.  72yM with history of COPD on symbicort  160 and spiriva, pulmonary nodules, CAD s/p CABG, CKD, ET, GERD, MGUS, 45 py smoker quit 2018, covid-19 infection 06/2021, last seen in our clinic 07/2021 at which point  Had nodular density on CXR 07/2021 posst covid infection and was re-referred to lung cancer screening program.   Continued on inhalers and antihistamine started for PND, saline nasal spray  Adherent to inhalers. No trips to the hospital since last visit. Doesn't think he had steroid or ABX course this year. Still a little more dyspneic than normal after covid-19. He does still have post-nasal drainage. Does not use flonase. Not using an antihistamine.   No family history of lung cancer.   Interval HPI:  Says he's doing ok overall. Gets dyspneic occasionally. Like when he's doing yardwork.   Has had a couple episodes of monster burp after which he has some improvement in his dyspnea. He has some reflux but he doesn't take anything for it. Was constipated but now taking miralax with improvement. Some nausea daily.   No hospitalizations since last visit. No courses of steroids or ABX since last visit.    No CP.   Uses fan over face when he has panic attacks.  ------------------------------------------------------------------- Started on breztri last visit. Now on breyna/spiriva respimat  GI eval   Otherwise pertinent review of systems is negative.  Past Medical History:  Diagnosis Date   Anxiety    Arthritis    "left knee" (08/05/2016)   Bruises easily    CAD (coronary artery disease) of artery bypass graft 08/05/2016   Around age 36. CABG - 3 vessel.    Chronic kidney disease    Colon polyp    COPD, 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   Alpha 1 studies normal per prior pulmonary  group notes Smoked 40 years, quit in 2012 June 2016 PFT from prior pulmonary group: "significant obstruction" FEV1 1.58L (47% pred), Residual volume 171% pred, DLCO 59% pred Simple Spirometry>> 09/15/2016 ratio 42% FEV1 1.02 L / 31%    Emphysema of lung ([facility])    Essential tremor 06/11/2016   Plans to see Dr. Arbutus Leas   Former smoker 09/01/2016   Quit 2012. 40 pack years at least.    GERD (gastroesophageal reflux disease)    Headache    History of blood transfusion 08/1997   "when he had his heart surgery"   History of shingles 1970-2013 X 3   HTN (hypertension) 08/05/2016   Amlodipine 5mg , atenolol 50mg  BID, benazepril 20mg    Hyperlipemia    Hypothyroidism    Lumbar disc disease    MGUS (monoclonal gammopathy of unknown significance)    Myocardial infarction ([facility]) 08/1997   Peripheral vascular disease ([facility])    PONV (postoperative nausea and vomiting)    Urinary tract infection 09/03/2017   Wears glasses      Family History  Problem Relation Age of Onset   Alcohol abuse Mother    Alcohol abuse Father        not involved in life   Alcoholism Sister    Healthy Sister    Healthy Sister    Allergic rhinitis Neg Hx    Angioedema Neg Hx    Asthma Neg Hx    Eczema Neg Hx    Immunodeficiency Neg Hx  Urticaria Neg Hx      Past Surgical History:  Procedure Laterality Date   AORTOGRAM Right 08/31/2017   Procedure: AORTOGRAM BILATERAL PELVIC ANGIOGRAM WITH LEFT ILIAC ARTERY STENT;  Surgeon: Fransisco Hertz, MD;  Location: [facility];  Service: Vascular;  Laterality: Right;   BACK SURGERY     CARDIAC CATHETERIZATION  08/1997   COLONOSCOPY  2014   CORONARY ARTERY BYPASS GRAFT  09/12/1997   "triple"   ESOPHAGOGASTRODUODENOSCOPY     INGUINAL HERNIA REPAIR Right 1961   KIDNEY REMOVED     KNEE RECONSTRUCTION Left 1960s - 1974 X 4   LAPAROSCOPIC ABLATION RENAL MASS  ~ 2014   "large abscess/pus ball"   LUMBAR DISC SURGERY  1990s   "they were wedged in the bowel"   PATELLA FRACTURE SURGERY  Left 1963   PATELLA FRACTURE SURGERY Left ~ 1965   "removed knee cap"   ROBOT ASSISTED LAPAROSCOPIC NEPHRECTOMY Left 10/07/2017   Procedure: XI ROBOTIC ASSISTED LAPAROSCOPIC NEPHRECTOMY OF PELVIC KIDNEY;  Surgeon: Sebastian Ache, MD;  Location: [facility];  Service: Urology;  Laterality: Left;  Place robot patient tower at foot of bed like prostate per Dr. Berneice Heinrich. Pull extra staple loads.   TONSILLECTOMY      Social History   Socioeconomic History   Marital status: Married    Spouse name: Not on file   Number of children: 2   Years of education: 12   Highest education level: Not on file  Occupational History   Occupation: Retired   Tobacco Use   Smoking status: Former    Packs/day: 1.00    Years: 45.00    Additional pack years: 0.00    Total pack years: 45.00    Types: Cigarettes    Quit date: 07/10/2017    Years since quitting: 5.7   Smokeless tobacco: Never  Vaping Use   Vaping Use: Never used  Substance and Sexual Activity   Alcohol use: Yes    Alcohol/week: 14.0 standard drinks of alcohol    Types: 14 Shots of liquor per week    Comment: daily 2 shots of vodka   Drug use: No   Sexual activity: Not Currently  Other Topics Concern   Not on file  Social History Narrative   Lives with wife. 2 adopted daughters from [place].       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         Former smoker no drugs 2 shots of vodka daily   Right handed    Tea daily   Social Determinants of Health   Financial Resource Strain: Low Risk  (04/12/2022)   Overall Financial Resource Strain (CARDIA)    Difficulty of Paying Living Expenses: Not hard at all  Food Insecurity: No Food Insecurity (04/12/2022)   Hunger Vital Sign    Worried About Running Out of Food in the Last Year: Never true    Ran Out of Food in the Last Year: Never true  Transportation Needs: No Transportation Needs (04/12/2022)   PRAPARE - Doctor, general practice (Medical): No    Lack of Transportation (Non-Medical): No  Physical Activity: Inactive (04/12/2022)   Exercise Vital Sign    Days of Exercise per Week: 0 days    Minutes of Exercise per Session: 0 min  Stress: No Stress Concern Present (04/12/2022)   [employer] of Occupational Health - Occupational Stress Questionnaire  Feeling of Stress : Not at all  Social Connections: Moderately Isolated (04/12/2022)   Social Connection and Isolation Panel [NHANES]    Frequency of Communication with Friends and Family: Never    Frequency of Social Gatherings with Friends and Family: More than three times a week    Attends Religious Services: Never    Database administrator or Organizations: No    Attends Banker Meetings: Never    Marital Status: Married  Catering manager Violence: Not At Risk (04/12/2022)   Humiliation, Afraid, Rape, and Kick questionnaire    Fear of Current or Ex-Partner: No    Emotionally Abused: No    Physically Abused: No    Sexually Abused: No     Allergies  Allergen Reactions   Contrast Media [Iodinated Contrast Media] Other (See Comments)    NOT ALLOWED DUE TO KIDNEY ISSUES.   Other Itching and Rash    Gel used for ultrasound SEVERELY broke out the skin    Keflex [Cephalexin] Diarrhea, Nausea Only and Other (See Comments)    Insomnia   Sulfa Antibiotics Other (See Comments)    Insomnia   Ciprofloxacin Diarrhea   Levaquin [Levofloxacin] Other (See Comments)    Insomnia    Omeprazole Diarrhea     Outpatient Medications Prior to Visit  Medication Sig Dispense Refill   acetaminophen (TYLENOL) 500 MG tablet Take 500-1,000 mg by mouth every 6 (six) hours as needed (for pain).      albuterol (PROVENTIL) (2.5 MG/3ML) 0.083% nebulizer solution TAKE 3 MLS (2.5 MG TOTAL) BY NEBULIZATION EVERY 6 (SIX) HOURS AS NEEDED FOR WHEEZING OR SHORTNESS OF BREATH. J44.9 300 mL 11   albuterol (VENTOLIN HFA) 108 (90 Base) MCG/ACT inhaler USE 2  INHALATIONS EVERY 4 HOURS AS NEEDED 25.5 g 2   aspirin EC 81 MG tablet Take 81 mg by mouth daily.     atenolol (TENORMIN) 50 MG tablet TAKE 1 TABLET DAILY 90 tablet 3   budesonide-formoterol (BREYNA) 160-4.5 MCG/ACT inhaler Inhale 2 puffs into the lungs 2 (two) times daily. 1 each 12   desonide (DESOWEN) 0.05 % cream Apply topically 2 (two) times daily. 7-10 days as needed for eczema 30 g 5   levothyroxine (SYNTHROID) 100 MCG tablet Take 1 tablet (100 mcg total) by mouth daily before breakfast. 90 tablet 3   mirtazapine (REMERON) 15 MG tablet TAKE 1 TABLET BY MOUTH EVERYDAY AT BEDTIME 90 tablet 3   rosuvastatin (CRESTOR) 40 MG tablet TAKE 1 TABLET DAILY (NEED APPOINTMENT) 90 tablet 3   SPIRIVA RESPIMAT 2.5 MCG/ACT AERS USE 2 INHALATIONS DAILY (NEED APPOINTMENT FOR FURTHER REFILLS) 12 g 3   traMADol (ULTRAM) 50 MG tablet TAKE 1 TABLET (50 MG TOTAL) BY MOUTH 2 (TWO) TIMES DAILY AS NEEDED FOR MODERATE PAIN OR SEVERE PAIN. 60 tablet 5   No facility-administered medications prior to visit.       Objective:   Physical Exam:  General appearance: 72 y.o., male, NAD, conversant  Eyes: anicteric sclerae; PERRL, tracking appropriately HENT: NCAT; MMM Neck: Trachea midline; no lymphadenopathy, no JVD Lungs: Diminished bilaterally, no crackles, no wheeze, with normal respiratory effort CV: RRR, no murmur  Abdomen: Soft, non-tender; non-distended, BS present  Extremities: No peripheral edema, warm Skin: Normal turgor and texture; no rash Psych: Appropriate affect Neuro: Alert and oriented to person and place, no focal deficit     There were no vitals filed for this visit.    on RA BMI Readings from Last 3 Encounters:  01/24/23 22.89 kg/m  01/04/23 23.84 kg/m  09/29/22 23.87 kg/m   Wt Readings from Last 3 Encounters:  01/24/23 155 lb (70.3 kg)  01/04/23 156 lb 12.8 oz (71.1 kg)  09/29/22 157 lb (71.2 kg)     CBC    Component Value Date/Time   WBC 7.9 05/03/2022 1047   RBC  3.70 (L) 05/03/2022 1047   HGB 11.3 (L) 05/03/2022 1047   HGB 10.2 (L) 02/14/2020 0833   HCT 35.1 (L) 05/03/2022 1047   PLT 210.0 05/03/2022 1047   PLT 217 02/14/2020 0833   MCV 94.7 05/03/2022 1047   MCH 28.8 07/14/2021 1626   MCHC 32.3 05/03/2022 1047   RDW 15.0 05/03/2022 1047   LYMPHSABS 1.2 05/03/2022 1047   MONOABS 0.8 05/03/2022 1047   EOSABS 0.5 05/03/2022 1047   BASOSABS 0.0 05/03/2022 1047     Chest Imaging:  CXR 10/30/21 reviewed by me - agree can't clearly see nodular opacity noted on prior exam  CT Chest 09/2019 reviewed by me remarkable for stable to decreased size of nodules,  04/2021 normal swallow  MRI c-spine 12/2020 severe right and moderate left foraminal stenosis c5-6  LDCT 01/20/22 reviewed by me stable nodules, emphysema and bronchial wall thickening  LDCT 01/21/23 stablel nodules, emphysema/bronchial wall thickening  Pulmonary Functions Testing Results:     No data to display        Cleda Daub 2019 severe obstruction with BD response   Echocardiogram:   Stress echo 2014 EF 55%, normal exercise response      Assessment & Plan:   # COPD gold functional group B  # History of smoking # small, stable pulmonary nodules Doing lung cancer screening program  # Chronic nausea # belching, monster burps Nausea has been attributed to CKD. Offered trial of anti reflux medication but he declines, opts to wait to discuss with GI.  Plan: - lung cancer screening re-referral placed today - can try breztri 2 puffs twice daily - hold symbicort/spiriva while on it - instructed to take his shower 20-30 min after morning or evening controller inhaler dose as humidity typically causes him some increased dyspnea - up to date with flu, encouraged to get RSV vaccine which he plans to do - ppsv23 at some point between 2022 and 2027 (has already had PCV13 and ppsv23 2017)    Requests follow up with Dr. Kendrick Fries in 6 months  Omar Person, MD [facility] 03/27/2023 7:27 AM

## 2023-03-28 ENCOUNTER — Ambulatory Visit (INDEPENDENT_AMBULATORY_CARE_PROVIDER_SITE_OTHER): Payer: Medicare HMO | Admitting: Student

## 2023-03-28 VITALS — BP 140/80 | HR 67

## 2023-03-28 DIAGNOSIS — J449 Chronic obstructive pulmonary disease, unspecified: Secondary | ICD-10-CM | POA: Diagnosis not present

## 2023-03-28 NOTE — Patient Instructions (Signed)
-   breyna 2 puffs twice daily, spiriva 2 puffs daily - can alternate albuterol neb and albuterol inhaler before strenuous exertion - referral placed today for pulmonary rehab they will call you to schedule - if you notice significant improvement in your exercise capacity when you use your oxygen then let us know and we will walk you around with it and test your O2 needs - they may also do this in pulmonary rehab.

## 2023-04-14 ENCOUNTER — Telehealth (HOSPITAL_COMMUNITY): Payer: Self-pay

## 2023-04-14 NOTE — Telephone Encounter (Signed)
Called and spoke with pt in regards to PR, pt stated he is not interested at this time.   Closed referral 

## 2023-04-18 ENCOUNTER — Other Ambulatory Visit: Payer: Self-pay | Admitting: Family Medicine

## 2023-05-09 ENCOUNTER — Ambulatory Visit (INDEPENDENT_AMBULATORY_CARE_PROVIDER_SITE_OTHER): Payer: Medicare HMO | Admitting: Family Medicine

## 2023-05-09 ENCOUNTER — Encounter: Payer: Self-pay | Admitting: Family Medicine

## 2023-05-09 VITALS — BP 136/60 | HR 68 | Temp 97.0°F | Ht 69.0 in | Wt 150.6 lb

## 2023-05-09 DIAGNOSIS — I10 Essential (primary) hypertension: Secondary | ICD-10-CM | POA: Diagnosis not present

## 2023-05-09 DIAGNOSIS — E785 Hyperlipidemia, unspecified: Secondary | ICD-10-CM

## 2023-05-09 DIAGNOSIS — G2581 Restless legs syndrome: Secondary | ICD-10-CM

## 2023-05-09 DIAGNOSIS — E559 Vitamin D deficiency, unspecified: Secondary | ICD-10-CM | POA: Diagnosis not present

## 2023-05-09 DIAGNOSIS — E538 Deficiency of other specified B group vitamins: Secondary | ICD-10-CM | POA: Diagnosis not present

## 2023-05-09 DIAGNOSIS — Z0000 Encounter for general adult medical examination without abnormal findings: Secondary | ICD-10-CM | POA: Diagnosis not present

## 2023-05-09 DIAGNOSIS — N184 Chronic kidney disease, stage 4 (severe): Secondary | ICD-10-CM

## 2023-05-09 LAB — LIPID PANEL
Cholesterol: 100 mg/dL (ref 0–200)
HDL: 50.5 mg/dL (ref >39.00)
LDL Cholesterol: 26 mg/dL (ref 0–99)
NonHDL: 49.89
Total CHOL/HDL Ratio: 2
Triglycerides: 119 mg/dL (ref 0.0–149.0)
VLDL: 23.8 mg/dL (ref 0.0–40.0)

## 2023-05-09 LAB — CBC WITH DIFFERENTIAL/PLATELET
Basophils Absolute: 0 10*3/uL (ref 0.0–0.1)
Basophils Relative: 0.5 % (ref 0.0–3.0)
Eosinophils Absolute: 0.3 10*3/uL (ref 0.0–0.7)
Eosinophils Relative: 4.7 % (ref 0.0–5.0)
HCT: 36.8 % — ABNORMAL LOW (ref 39.0–52.0)
Hemoglobin: 11.7 g/dL — ABNORMAL LOW (ref 13.0–17.0)
Lymphocytes Relative: 14.2 % (ref 12.0–46.0)
Lymphs Abs: 1 10*3/uL (ref 0.7–4.0)
MCHC: 31.8 g/dL (ref 30.0–36.0)
MCV: 96 fl (ref 78.0–100.0)
Monocytes Absolute: 0.8 10*3/uL (ref 0.1–1.0)
Monocytes Relative: 11.1 % (ref 3.0–12.0)
Neutro Abs: 4.8 10*3/uL (ref 1.4–7.7)
Neutrophils Relative %: 69.5 % (ref 43.0–77.0)
Platelets: 217 10*3/uL (ref 150.0–400.0)
RBC: 3.83 Mil/uL — ABNORMAL LOW (ref 4.22–5.81)
RDW: 15.3 % (ref 11.5–15.5)
WBC: 6.9 10*3/uL (ref 4.0–10.5)

## 2023-05-09 LAB — IBC + FERRITIN
Ferritin: 88 ng/mL (ref 22.0–322.0)
Iron: 68 ug/dL (ref 42–165)
Saturation Ratios: 22.5 % (ref 20.0–50.0)
TIBC: 302.4 ug/dL (ref 250.0–450.0)
Transferrin: 216 mg/dL (ref 212.0–360.0)

## 2023-05-09 LAB — COMPREHENSIVE METABOLIC PANEL
ALT: 12 U/L (ref 0–53)
AST: 16 U/L (ref 0–37)
Albumin: 3.7 g/dL (ref 3.5–5.2)
Alkaline Phosphatase: 54 U/L (ref 39–117)
BUN: 31 mg/dL — ABNORMAL HIGH (ref 6–23)
CO2: 30 mEq/L (ref 19–32)
Calcium: 8.6 mg/dL (ref 8.4–10.5)
Chloride: 105 mEq/L (ref 96–112)
Creatinine, Ser: 3.55 mg/dL — ABNORMAL HIGH (ref 0.40–1.50)
GFR: 16.4 mL/min — ABNORMAL LOW (ref >60.00)
Glucose, Bld: 81 mg/dL (ref 70–99)
Potassium: 4.7 mEq/L (ref 3.5–5.1)
Sodium: 141 mEq/L (ref 135–145)
Total Bilirubin: 0.5 mg/dL (ref 0.2–1.2)
Total Protein: 6.9 g/dL (ref 6.0–8.3)

## 2023-05-09 LAB — VITAMIN D 25 HYDROXY (VIT D DEFICIENCY, FRACTURES): VITD: 98.36 ng/mL (ref 30.00–100.00)

## 2023-05-09 LAB — VITAMIN B12: Vitamin B-12: >1500 pg/mL — ABNORMAL HIGH (ref 211–911)

## 2023-05-09 LAB — TSH: TSH: 0.12 u[IU]/mL — ABNORMAL LOW (ref 0.35–5.50)

## 2023-05-09 NOTE — Progress Notes (Signed)
Phone: [phone]   Subjective:  Patient presents today for their annual physical. Chief complaint-noted.   See problem oriented charting- ROS- full  review of systems was completed and negative  except for: restless legs, stable shortness of breath , joint pain, joint swelling, allergies, tremors per baseline, trouble sleep  The following were reviewed and entered/updated in epic: Past Medical History:  Diagnosis Date   Allergy 1980   Anxiety    Arthritis    "left knee" (08/05/2016)   Bruises easily    CAD (coronary artery disease) of artery bypass graft 08/05/2016   Around age 47. CABG - 3 vessel.    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 Arbutus Leas   Former smoker 09/01/2016   Quit 2012. 40 pack years at least.    GERD (gastroesophageal reflux disease)    Headache    History of blood transfusion 08/1997   "when he had his heart surgery"   History of shingles 1970-2013 X 3   HTN (hypertension) 08/05/2016   Amlodipine 5mg , atenolol 50mg  BID, benazepril 20mg    Hyperlipemia    Hypothyroidism    Lumbar disc disease    MGUS (monoclonal gammopathy of unknown significance)    Myocardial infarction (HCC) 08/1997   Peripheral vascular disease (HCC)    PONV (postoperative nausea and vomiting)    Urinary tract infection 09/03/2017   Wears glasses    Patient Active Problem List   Diagnosis Date Noted   Chronic kidney disease (CKD), stage IV (severe) (HCC) 08/13/2019    Priority: High   S/p nephrectomy 12/05/2018    Priority: High   MGUS (monoclonal gammopathy of unknown  significance) 08/02/2018    Priority: High   Osteoporosis 06/26/2018    Priority: High   Atherosclerosis of native arteries of extremity with intermittent claudication (HCC) 07/06/2017    Priority: High   Weight loss 04/23/2017    Priority: High   Former smoker 09/01/2016    Priority: High   COPD, 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    Priority: High   Hx of CABG 08/05/2016    Priority: High   CAD (coronary artery disease) of artery bypass graft 08/05/2016    Priority: High   Vitamin D deficiency 01/20/2021    Priority: Medium    Vitamin B12 deficiency 01/20/2021    Priority: Medium    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    Priority: Medium    History of adenomatous polyp of colon 04/22/2017    Priority: Medium    Hyperlipidemia 04/04/2017    Priority: Medium    Hypothyroidism (acquired) 04/04/2017    Priority: Medium    Essential hypertension 08/05/2016    Priority: Medium    Essential tremor 06/11/2016    Priority: Medium    Perennial allergic rhinitis 01/24/2018    Priority: Low   Anxiety 07/24/2017    Priority: Low   Pyelonephritis 04/23/2017    Priority: Low   Aortic atherosclerosis (HCC) 04/13/2017    Priority: Low   Seasonal allergies 04/04/2017    Priority: Low   Myalgia  10/04/2016    Priority: Low   Dysuria 08/23/2016    Priority: Low   Chronic pain of both knees 08/23/2016    Priority: Low   Tension headache 06/11/2016    Priority: Low   Senile purpura (HCC) 05/03/2022   Hearing abnormally acute, left 04/03/2020   Healthcare maintenance 04/03/2020   Constipation 09/03/2017   Past Surgical History:  Procedure Laterality Date   AORTOGRAM Right 08/31/2017   Procedure: AORTOGRAM BILATERAL PELVIC ANGIOGRAM WITH LEFT ILIAC ARTERY STENT;  Surgeon: Fransisco Hertz, MD;  Location: [facility] OR;  Service: Vascular;  Laterality: Right;   BACK SURGERY     CARDIAC CATHETERIZATION  08/1997   COLONOSCOPY  2014   CORONARY ARTERY BYPASS GRAFT  09/12/1997   "triple"    ESOPHAGOGASTRODUODENOSCOPY     INGUINAL HERNIA REPAIR Right 1961   KIDNEY REMOVED     KNEE RECONSTRUCTION Left 1960s - 1974 X 4   LAPAROSCOPIC ABLATION RENAL MASS  ~ 2014   "large abscess/pus ball"   LUMBAR DISC SURGERY  1990s   "they were wedged in the bowel"   PATELLA FRACTURE SURGERY Left 1963   PATELLA FRACTURE SURGERY Left ~ 1965   "removed knee cap"   ROBOT ASSISTED LAPAROSCOPIC NEPHRECTOMY Left 10/07/2017   Procedure: XI ROBOTIC ASSISTED LAPAROSCOPIC NEPHRECTOMY OF PELVIC KIDNEY;  Surgeon: Sebastian Ache, MD;  Location: [facility] ORS;  Service: Urology;  Laterality: Left;  Place robot patient tower at foot of bed like prostate per Dr. Berneice Heinrich. Pull extra staple loads.   TONSILLECTOMY      Family History  Problem Relation Age of Onset   Alcohol abuse Mother    Alcohol abuse Father        not involved in life   Alcoholism Sister    Healthy Sister    Healthy Sister    Allergic rhinitis Neg Hx    Angioedema Neg Hx    Asthma Neg Hx    Eczema Neg Hx    Immunodeficiency Neg Hx    Urticaria Neg Hx     Medications- reviewed and updated Current Outpatient Medications  Medication Sig Dispense Refill   acetaminophen (TYLENOL) 500 MG tablet Take 500-1,000 mg by mouth every 6 (six) hours as needed (for pain).      albuterol (PROVENTIL) (2.5 MG/3ML) 0.083% nebulizer solution TAKE 3 MLS (2.5 MG TOTAL) BY NEBULIZATION EVERY 6 (SIX) HOURS AS NEEDED FOR WHEEZING OR SHORTNESS OF BREATH. J44.9 300 mL 11   albuterol (VENTOLIN HFA) 108 (90 Base) MCG/ACT inhaler USE 2 INHALATIONS EVERY 4 HOURS AS NEEDED 25.5 g 2   aspirin EC 81 MG tablet Take 81 mg by mouth daily.     atenolol (TENORMIN) 50 MG tablet TAKE 1 TABLET DAILY 90 tablet 3   budesonide-formoterol (BREYNA) 160-4.5 MCG/ACT inhaler Inhale 2 puffs into the lungs 2 (two) times daily. 1 each 12   desonide (DESOWEN) 0.05 % cream Apply topically 2 (two) times daily. 7-10 days as needed for eczema 30 g 5   levothyroxine (SYNTHROID) 100 MCG tablet  TAKE 1 TABLET DAILY BEFORE BREAKFAST 90 tablet 3   mirtazapine (REMERON) 15 MG tablet TAKE 1 TABLET BY MOUTH EVERYDAY AT BEDTIME 90 tablet 3   rosuvastatin (CRESTOR) 40 MG tablet TAKE 1 TABLET DAILY (NEED APPOINTMENT) 90 tablet 3   SPIRIVA RESPIMAT 2.5 MCG/ACT AERS USE 2 INHALATIONS DAILY (NEED APPOINTMENT FOR FURTHER REFILLS) 12 g 3   traMADol (ULTRAM) 50 MG tablet TAKE 1 TABLET (50 MG TOTAL) BY MOUTH 2 (TWO)  TIMES DAILY AS NEEDED FOR MODERATE PAIN OR SEVERE PAIN. 60 tablet 5   No current facility-administered medications for this visit.    Allergies-reviewed and updated Allergies  Allergen Reactions   Contrast Media [Iodinated Contrast Media] Other (See Comments)    NOT ALLOWED DUE TO KIDNEY ISSUES.   Other Itching and Rash    Gel used for ultrasound SEVERELY broke out the skin    Keflex [Cephalexin] Diarrhea, Nausea Only and Other (See Comments)    Insomnia   Sulfa Antibiotics Other (See Comments)    Insomnia   Ciprofloxacin Diarrhea   Levaquin [Levofloxacin] Other (See Comments)    Insomnia    Omeprazole Diarrhea    Social History   Social History Narrative   Lives with wife. 2 adopted daughters from [place].       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 Oropharynx normal Neck: no thyromegaly CV: RRR no murmurs rubs or gallops Lungs: CTAB no crackles, wheeze, rhonchi Abdomen: soft/nontender/nondistended/normal bowel sounds. No rebound or guarding. Hernia noted in abdomen but stbale Ext: no edema Skin: warm, dry Neuro: grossly normal, moves all extremities, PERRLA    Assessment and Plan  72 y.o. male  presenting for annual physical.  Health Maintenance counseling: 1. Anticipatory guidance: Patient counseled regarding regular dental exams -q6 months, eye exams -yearly,  avoiding smoking and second hand smoke , limiting alcohol to 2 beverages per day - 7 a week, no illicit drugs .   2. Risk factor reduction:  Advised patient of need for regular exercise and diet rich and fruits and vegetables to reduce risk of heart attack and stroke.  Exercise- has tried exercising but breathing gets in the way- is at least active in his yard thankfully.  Diet/weight management-down 9 lbs from last Comprehensive Physical Exam (CPE) preventive care annual visit - seemed to go down when stopped ensure- he stopped once weight got up.  Wt Readings from Last 3 Encounters:  05/09/23 150 lb 9.6 oz (68.3 kg)  01/24/23 155 lb (70.3 kg)  01/04/23 156 lb 12.8 oz (71.1 kg)  3. Immunizations/screenings/ancillary studies- hearing loss after COVID vaccine in past so holidng off  Immunization History  Administered Date(s) Administered   Fluad Quad(high Dose 65+) 07/31/2019, 09/03/2021, 09/02/2022   Influenza Split 08/30/2013, 08/18/2016   Influenza, High Dose Seasonal PF 09/14/2017   Influenza,inj,Quad PF,6+ Mos 07/23/2016   Influenza,inj,quad, With Preservative 07/23/2016   Influenza-Unspecified 08/30/2013, 09/01/2018, 07/23/2020   PFIZER(Purple Top)SARS-COV-2 Vaccination 12/18/2019, 01/08/2020   PNEUMOCOCCAL CONJUGATE-20 05/03/2022   Pneumococcal Conjugate-13 04/03/2013   Pneumococcal Polysaccharide-23 06/10/2016   Pneumococcal-Unspecified 04/03/2013, 07/02/2016   Respiratory Syncytial Virus Vaccine,Recomb Aduvanted(Arexvy) 10/02/2022   Tdap 06/11/2007, 10/06/2014  4. Prostate cancer screening-  opts out. has followed with Dr. Berneice Heinrich in the past- has been 3-4 years  Lab Results  Component Value Date   PSA 0.60 05/22/2018   5. Colon cancer screening - prior cologuard ordered. Polyps but sounds like hyperplastic in  past. Has had some bowel changes recently using miralax and stabilizing now so he thinks he can submit this soon 6. Skin cancer screening- no dermatologist. advised regular sunscreen use. Denies worrisome, changing, or new skin lesions.  7. Smoking associated screening (lung cancer screening, AAA screen 65-75, UA)- former smoker- in lung cancer screening program . Has had blood in urine but reports evaluated by Dr. Berneice Heinrich in past- has been that way for years he reports 8. STD screening - only active with wife   Status of chronic or acute concerns   # Restless legs S:started last Thursday after knees were aching that day. Has happened on and off in the past. Went to bed and felt a little run down- didn't sleep well that night- getting only 2-3 hours of sleep. Last night slightly better. Going back down.  Usually about twice a year.  A/P: as improving and has had on and off in the past - he wants to simply monitor for now as improving but we will check ferritin- could use iron if under 75   #osteoporosis- declines bone density as cannot use fosamax due to GFR under 30- he wants to hold off on checking again  # CKD IV S: follows with [facility] (prior with Dr. Hyman Hopes- now Dr. Allena Katz) -previously thought . likely going to go on peritoneal dialysis within 6 months of 11/26/2019 per patient report after visit but later filtration rate improved to greater than 30! before dipping back down into 20s - not interested in dialysis at this point - not candidate for transplant A/P: has been stable lately- continue to monitor - check with labs  # Hypertension-also with essential tremor S: compliant with atenolol 50mg .  -Off benazepril 20mg  due to renal function.  Prior on amlodipine A/P: reasonable control - continue current medications   # COPD - follows with pulm Dr. Kendrick Fries- Dr. Thora Lance also leaving S: Patient reports stable shortness of breath and cough -still very bothersome- recently walking in stores  I/o scooter Maintenance medications: spiriva 2.5 mcg 2 puffs daily and symbicort - anxiety plays a role- fan in face helps -apparently did not qualify for pulmonary rehab  Patient has had to use albuterol 2 x per day.  A/P: severe copd but symptoms table- continue current medications   #hyperlipidemia #aortic atherosclerosis S: Medication: rosuvastatin 40 mg Lab Results  Component Value Date   CHOL 96 05/03/2022   HDL 53.10 05/03/2022   LDLCALC 27 05/03/2022   LDLDIRECT 45.0 02/07/2019   TRIG 76.0 05/03/2022   CHOLHDL 2 05/03/2022  A/P: lipids at goal last year. Aortic atherosclerosis (presumed stable)- LDL goal ideally <70   #hypothyroidism S: compliant On thyroid medication- levothyroxine Lab Results  Component Value Date   TSH 0.76 06/14/2022  A/P:hopefully stable- update tsh today. Continue current meds for now    #Unintentional weight loss- no clear cause on workup- trial of mirtazepine 15 mg mild help for appetite. Had been doing ensure but stopped but willing to retry- seems like weight loss may correspond recently to him stopping this.  -beef, pork still cant eat post covid- makes nauseous unfortunately . Is doing eggs and chicken. Some fish  #MGUS-  follow up with hematology early on - wants to hold off on further workup unless worsens- update CBC today  #Vitamin D deficiency S: Medication: 1000 units A/P: hopefully stable- update vitamin D today. Continue current meds for now     # B12 deficiency S: Current treatment/medication (oral vs. IM):  tolerating oral b12 A/P: hopefully stable- update b12 today. Continue current meds for now    Recommended follow up: Return in about 6 months (around 11/08/2023) for followup or sooner if needed.Schedule b4 you leave.  Lab/Order associations:NOT fasting   ICD-10-CM   1. Routine general medical examination at a health care facility  Z00.00     2. Chronic kidney disease (CKD), stage IV (severe) (HCC)  N18.4      3. Essential hypertension  I10 Comprehensive metabolic panel    CBC with Differential/Platelet    4. Hyperlipidemia, unspecified hyperlipidemia type  E78.5 Comprehensive metabolic panel    CBC with Differential/Platelet    Lipid panel    TSH    5. Vitamin B12 deficiency  E53.8 Vitamin B12    6. Vitamin D deficiency  E55.9 VITAMIN D 25 Hydroxy (Vit-D Deficiency, Fractures)    7. Restless legs  G25.81 IBC + Ferritin      No orders of the defined types were placed in this encounter.   Return precautions advised.  Tana Conch, MD

## 2023-05-09 NOTE — Patient Instructions (Addendum)
Let us know when you decide to get your [facility] vaccines at the pharmacy.  Get Cologuard completed.  Restart ensure as was helpful for weight   Please stop by lab before you go If you have mychart- we will send your results within 3 business days of [place] receiving them.  If you do not have mychart- we will call you about results within 5 business days of [place] receiving them.  *please also note that you will see labs on mychart as soon as they post. I will later go in and write notes on them- will say "notes from Dr. Durene Cal"    Recommended follow up: Return in about 6 months (around 11/08/2023) for followup or sooner if needed.Schedule b4 you leave.  -as long as weight does not continue to decline on the ensure

## 2023-05-10 ENCOUNTER — Telehealth: Payer: Self-pay | Admitting: Family Medicine

## 2023-05-10 ENCOUNTER — Other Ambulatory Visit: Payer: Self-pay

## 2023-05-10 DIAGNOSIS — E039 Hypothyroidism, unspecified: Secondary | ICD-10-CM

## 2023-05-10 MED ORDER — LEVOTHYROXINE SODIUM 88 MCG PO TABS: 88.0000 ug | ORAL_TABLET | Freq: Every day | ORAL | 0 refills | Status: DC

## 2023-05-10 NOTE — Telephone Encounter (Signed)
LVM for pt to call back and schedule 6 week lab only visit for TSH re-check per provider request.

## 2023-05-13 NOTE — Telephone Encounter (Signed)
Pt has been scheduled for 06/22/23.

## 2023-06-22 ENCOUNTER — Other Ambulatory Visit (INDEPENDENT_AMBULATORY_CARE_PROVIDER_SITE_OTHER): Payer: Medicare HMO

## 2023-06-22 DIAGNOSIS — E039 Hypothyroidism, unspecified: Secondary | ICD-10-CM

## 2023-06-22 LAB — TSH: TSH: 3 u[IU]/mL (ref 0.35–5.50)

## 2023-07-06 ENCOUNTER — Other Ambulatory Visit: Payer: Self-pay | Admitting: Family Medicine

## 2023-07-21 ENCOUNTER — Other Ambulatory Visit: Payer: Self-pay | Admitting: Family Medicine

## 2023-07-21 ENCOUNTER — Encounter: Payer: Self-pay | Admitting: Family Medicine

## 2023-07-22 ENCOUNTER — Other Ambulatory Visit: Payer: Self-pay

## 2023-07-22 MED ORDER — LEVOTHYROXINE SODIUM 88 MCG PO TABS: 88.0000 ug | ORAL_TABLET | Freq: Every day | ORAL | 3 refills | Status: DC

## 2023-07-23 ENCOUNTER — Encounter: Payer: Self-pay | Admitting: Family Medicine

## 2023-08-10 ENCOUNTER — Telehealth: Payer: Self-pay | Admitting: Adult Health

## 2023-08-10 DIAGNOSIS — J449 Chronic obstructive pulmonary disease, unspecified: Secondary | ICD-10-CM

## 2023-08-10 NOTE — Telephone Encounter (Signed)
Patient came in to office on 9/18. He is requesting referral be sent on his behalf to his new pulmonologist, Dr Kendrick Fries with [facility]. He was given Medical Records number in order transfer records. He has a former patient of his and Dr Thora Lance (both of whom are not with the practice). Patient is aware that this may take a few days, but that we will call to let them know that this is completed.

## 2023-08-12 NOTE — Telephone Encounter (Signed)
Of course if that is what patient would like

## 2023-08-12 NOTE — Telephone Encounter (Signed)
Brian Graham, please advise if you are fine with [place] referring pt to [facility] (Dr. Kendrick Fries)

## 2023-08-13 NOTE — Telephone Encounter (Signed)
Called and spoke with patient. He is aware that TP is ok with the referral. Referral has been placed and is aware.   Nothing further needed at time of call.

## 2023-08-29 NOTE — Telephone Encounter (Addendum)
F/u   Patient at the front desk wondering why the referral that was placed says Closed when he called [facility]   Patient is asking for the referral to be open back up he's able to schedule appt.

## 2023-09-01 ENCOUNTER — Other Ambulatory Visit: Payer: Self-pay | Admitting: Internal Medicine

## 2023-09-01 ENCOUNTER — Other Ambulatory Visit: Payer: Self-pay | Admitting: Family Medicine

## 2023-09-28 ENCOUNTER — Ambulatory Visit: Payer: Medicare HMO | Admitting: Internal Medicine

## 2023-09-28 ENCOUNTER — Encounter: Payer: Self-pay | Admitting: Internal Medicine

## 2023-09-28 VITALS — BP 122/70 | HR 72 | Ht 69.0 in | Wt 151.0 lb

## 2023-09-28 DIAGNOSIS — R630 Anorexia: Secondary | ICD-10-CM

## 2023-09-28 DIAGNOSIS — Z9981 Dependence on supplemental oxygen: Secondary | ICD-10-CM

## 2023-09-28 DIAGNOSIS — J392 Other diseases of pharynx: Secondary | ICD-10-CM | POA: Diagnosis not present

## 2023-09-28 DIAGNOSIS — R1310 Dysphagia, unspecified: Secondary | ICD-10-CM | POA: Diagnosis not present

## 2023-09-28 DIAGNOSIS — R110 Nausea: Secondary | ICD-10-CM

## 2023-09-28 DIAGNOSIS — J449 Chronic obstructive pulmonary disease, unspecified: Secondary | ICD-10-CM

## 2023-09-28 DIAGNOSIS — R1110 Vomiting, unspecified: Secondary | ICD-10-CM

## 2023-09-28 MED ORDER — MIRTAZAPINE 15 MG PO TABS: 15.0000 mg | ORAL_TABLET | Freq: Every day | ORAL | 3 refills | Status: DC

## 2023-09-28 NOTE — Progress Notes (Deleted)
Brian Graham 72 y.o. [birth year] [id number]  Assessment & Plan:      Subjective:   Chief Complaint:  HPI  Allergies  Allergen Reactions   Contrast Media [Iodinated Contrast Media] Other (See Comments)    NOT ALLOWED DUE TO KIDNEY ISSUES.   Other Itching and Rash    Gel used for ultrasound SEVERELY broke out the skin    Keflex [Cephalexin] Diarrhea, Nausea Only and Other (See Comments)    Insomnia   Sulfa Antibiotics Other (See Comments)    Insomnia   Ciprofloxacin Diarrhea   Levaquin [Levofloxacin] Other (See Comments)    Insomnia    Omeprazole Diarrhea   Current Meds  Medication Sig   acetaminophen (TYLENOL) 500 MG tablet Take 500-1,000 mg by mouth every 6 (six) hours as needed (for pain).    albuterol (PROVENTIL) (2.5 MG/3ML) 0.083% nebulizer solution INHALE 1 VIAL VIA NEBULIZER EVERY 6 HOURS AS NEEDED FOR WHEEZING OR SHORTNESS OF BREATH   albuterol (VENTOLIN HFA) 108 (90 Base) MCG/ACT inhaler USE 2 INHALATIONS EVERY 4 HOURS AS NEEDED   aspirin EC 81 MG tablet Take 81 mg by mouth daily.   atenolol (TENORMIN) 50 MG tablet TAKE 1 TABLET DAILY   budesonide-formoterol (BREYNA) 160-4.5 MCG/ACT inhaler Inhale 2 puffs into the lungs 2 (two) times daily.   desonide (DESOWEN) 0.05 % cream Apply topically 2 (two) times daily. 7-10 days as needed for eczema   levothyroxine (SYNTHROID) 88 MCG tablet Take 1 tablet (88 mcg total) by mouth daily before breakfast.   mirtazapine (REMERON) 15 MG tablet TAKE 1 TABLET BY MOUTH EVERYDAY AT BEDTIME   rosuvastatin (CRESTOR) 40 MG tablet TAKE 1 TABLET DAILY (NEED APPOINTMENT)   SPIRIVA RESPIMAT 2.5 MCG/ACT AERS USE 2 INHALATIONS DAILY (NEED APPOINTMENT FOR FURTHER REFILLS)   traMADol (ULTRAM) 50 MG tablet TAKE 1 TABLET (50 MG TOTAL) BY MOUTH 2 (TWO) TIMES DAILY AS NEEDED FOR MODERATE PAIN OR SEVERE PAIN.   Past Medical History:  Diagnosis Date   Allergy 1980   Anxiety    Arthritis    "left knee" (08/05/2016)   Bruises easily     CAD (coronary artery disease) of artery bypass graft 08/05/2016   Around age 89. CABG - 3 vessel.    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 Arbutus Leas   Former smoker 09/01/2016   Quit 2012. 40 pack years at least.    GERD (gastroesophageal reflux disease)    Headache    History of blood transfusion 08/1997   "when he had his heart surgery"   History of shingles 1970-2013 X 3   HTN (hypertension) 08/05/2016   Amlodipine 5mg , atenolol 50mg  BID, benazepril 20mg    Hyperlipemia    Hypothyroidism    Lumbar disc disease    MGUS (monoclonal gammopathy of unknown significance)    Myocardial infarction (HCC) 08/1997   Peripheral vascular disease (HCC)    PONV (postoperative nausea and vomiting)    Urinary tract infection 09/03/2017   Wears glasses    Past Surgical History:  Procedure Laterality Date  AORTOGRAM Right 08/31/2017   Procedure: AORTOGRAM BILATERAL PELVIC ANGIOGRAM WITH LEFT ILIAC ARTERY STENT;  Surgeon: Fransisco Hertz, MD;  Location: [facility] OR;  Service: Vascular;  Laterality: Right;   BACK SURGERY     CARDIAC CATHETERIZATION  08/1997   COLONOSCOPY  2014   CORONARY ARTERY BYPASS GRAFT  09/12/1997   "triple"   ESOPHAGOGASTRODUODENOSCOPY     INGUINAL HERNIA REPAIR Right 1961   KIDNEY REMOVED     KNEE RECONSTRUCTION Left 1960s - 1974 X 4   LAPAROSCOPIC ABLATION RENAL MASS  ~ 2014   "large abscess/pus ball"   LUMBAR DISC SURGERY  1990s   "they were wedged in the bowel"   PATELLA FRACTURE SURGERY Left 1963   PATELLA FRACTURE SURGERY Left ~ 1965   "removed knee cap"   ROBOT  ASSISTED LAPAROSCOPIC NEPHRECTOMY Left 10/07/2017   Procedure: XI ROBOTIC ASSISTED LAPAROSCOPIC NEPHRECTOMY OF PELVIC KIDNEY;  Surgeon: Sebastian Ache, MD;  Location: [facility] ORS;  Service: Urology;  Laterality: Left;  Place robot patient tower at foot of bed like prostate per Dr. Berneice Heinrich. Pull extra staple loads.   TONSILLECTOMY     Social History   Social History Narrative   Lives with wife. 2 adopted daughters from [place].       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   Review of Systems   Objective:   Physical Exam

## 2023-09-28 NOTE — Progress Notes (Signed)
Brian Graham 72 y.o. 17-Nov-1951 [id number]  Assessment & Plan:   Encounter Diagnoses  Name Primary?   Dysphagia, unspecified type Yes   Cricopharyngeal spasm ?    Chronic nausea    Dry heaves    Anorexia    On home oxygen therapy    COPD, severe (HCC)     Multitude of chronic problems persist.  When I last saw him it sounded like things were better.  The chronic nausea and anorexia issues remain.  So I guess he was not helped by mirtazapine is much as I thought when last seen though he would like a refill.  Dysphagia was thought to be pharyngoesophageal although I am getting a sense that there is some esophageal dysphagia as well today.  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  He has had some grade a reflux esophagitis seen as well but he had diarrhea with omeprazole so I did not treat that.  Will reassess that as well.  Since he has COPD and is on home oxygen therapy his procedure needs to be performed at the hospital due to his high risk of sedation.  I have explained the risks benefits and indications to the patient and he understands and agrees to proceed.    Subjective:   Chief Complaint: Dysphagia and history of nausea, anorexia.  Refill of mirtazapine requested.  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  He has a history of grade a reflux esophagitis though is not on PPI treatment due to diarrhea with omeprazole.  He presents today with his wife.  HPI as below otherwise.  Discussed the use of AI scribe software for clinical note transcription with the patient, who gave verbal consent to proceed.   The patient  presents with a chronic history of dry heaves, which have been occurring intermittently for approximately 15 years. The episodes of dry heaves are described as violent and last for about five minutes. He reports periods of remission lasting up to six months, but the symptoms have recently returned.  In addition to the dry heaves, the patient reports a sensation of a "hook" in his throat, which he describes as difficulty swallowing. This issue has been ongoing since a previous consultation in 2022,. The patient reports that food, including lettuce and bacon, often gets stuck in his throat, and attempts to wash it down with liquids often result in regurgitation. He has been managing this issue by taking smaller bites of food, but he occasionally forgets and encounters difficulties. The patient also reports avoiding eating out of fear of not being able to swallow, which has led to a decrease in food intake. no heartburn issues.  The patient has been taking mirtazapine for chronic daily nausea, and in 2022 had reported significant benefit, Now he and especially wife say it remains an issue as do the dry heaves. also anorexia issues remain a problem. has been out of mirtazapine x 3 weeks and does want a refill.  The patient has a history of COPD and is on home oxygen, although he only uses it as needed.      Screening chest CT 01/21/2023 IMPRESSION: 1. Lung-RADS 2, benign  appearance or behavior. Continue annual screening with low-dose chest CT without contrast in 12 months. 2. Midline supraumbilical hernia contains fat. 3.  Aortic atherosclerosis (ICD10-I70.0). 4.  Emphysema (ICD10-J43.9).      Wt Readings from Last 3 Encounters:  09/28/23 151 lb (68.5 kg)  05/09/23 150 lb 9.6 oz (68.3 kg)  01/24/23 155 lb (70.3 kg)   Modified barium swallow 05/12/2021   Clinical Impression Pt presents with a minimal pharyngeal and primary esophageal dysphagia with cricopharyngeal prominence noted. He describes  cervical esophageal globus sensation with associated coughing about 2 times a week with foods per pt report. Pt with trace post swallow vallecular residuals and trace to mild pyriform sinsus and cricopharyngeal segment residuals with POs. Reflexive second swallows assisted to reduce residuals. 13 mm barium tablet was able to pass through UES without difficulty. Educated pt regarding results of exam and strategies to mitigate difficulty. Pt reports frequent hoarseness, followed by GI. He states he does not currently take PPI. Recommended starting meals with warmer liquids to assist CP relaxation and decrease pt difficulty . Pt also to pursue wearing dentures to assist with mastication of regular POs. He states he does not use dentures generally due to discomfort but will work to get a better fit. No further ST needs identified.   EGD November 27, 2019   - LA Grade A reflux esophagitis with no bleeding.                            Biopsied.                           - Gastritis. Biopsied. Could be from ASA (on 81 mg                            qd) and no PPI currently (hx of diarrhea from                            omeprazole)                           - Duodenal mucosal atrophy. Biopsied.                           - The examination was otherwise normal. 1. Surgical [P], duodenal bulb, 2nd portion of duodenum and distal duodenum - BENIGN DUODENAL MUCOSA WITH FOCAL VILLOUS BLUNTING - NO ACUTE INFLAMMATION OR INCREASED INTRAEPITHELIAL LYMPHOCYTES - SEE COMMENT 2. Surgical [P], gastric antrum - REACTIVE GASTROPATHY - NO H. PYLORI OR INTESTINAL METAPLASIA IDENTIFIED - SEE COMMENT 3. Surgical [P], esophagus, GE junction - REACTIVE SQUAMOUS MUCOSA WITH MILDLY INCREASED INTRAEPITHELIAL LYMPHOCYTES AND SUBMUCOSA CHRONIC INFLAMMATION - SEE COMMENT Allergies  Allergen Reactions   Contrast Media [Iodinated Contrast Media] Other (See Comments)    NOT ALLOWED DUE TO KIDNEY ISSUES.   Other Itching and Rash     Gel used for ultrasound SEVERELY broke out the skin    Keflex [Cephalexin] Diarrhea, Nausea Only and Other (See Comments)    Insomnia   Sulfa Antibiotics Other (See Comments)    Insomnia   Ciprofloxacin Diarrhea   Levaquin [Levofloxacin] Other (See Comments)    Insomnia    Omeprazole Diarrhea   Current Meds  Medication Sig   acetaminophen (  TYLENOL) 500 MG tablet Take 500-1,000 mg by mouth every 6 (six) hours as needed (for pain).    albuterol (PROVENTIL) (2.5 MG/3ML) 0.083% nebulizer solution INHALE 1 VIAL VIA NEBULIZER EVERY 6 HOURS AS NEEDED FOR WHEEZING OR SHORTNESS OF BREATH   albuterol (VENTOLIN HFA) 108 (90 Base) MCG/ACT inhaler USE 2 INHALATIONS EVERY 4 HOURS AS NEEDED   aspirin EC 81 MG tablet Take 81 mg by mouth daily.   atenolol (TENORMIN) 50 MG tablet TAKE 1 TABLET DAILY   budesonide-formoterol (BREYNA) 160-4.5 MCG/ACT inhaler Inhale 2 puffs into the lungs 2 (two) times daily.   desonide (DESOWEN) 0.05 % cream Apply topically 2 (two) times daily. 7-10 days as needed for eczema   levothyroxine (SYNTHROID) 88 MCG tablet Take 1 tablet (88 mcg total) by mouth daily before breakfast.   mirtazapine (REMERON) 15 MG tablet TAKE 1 TABLET BY MOUTH EVERYDAY AT BEDTIME   rosuvastatin (CRESTOR) 40 MG tablet TAKE 1 TABLET DAILY (NEED APPOINTMENT)   SPIRIVA RESPIMAT 2.5 MCG/ACT AERS USE 2 INHALATIONS DAILY (NEED APPOINTMENT FOR FURTHER REFILLS)   traMADol (ULTRAM) 50 MG tablet TAKE 1 TABLET (50 MG TOTAL) BY MOUTH 2 (TWO) TIMES DAILY AS NEEDED FOR MODERATE PAIN OR SEVERE PAIN.   Past Medical History:  Diagnosis Date   Allergy 1980   Anxiety    Arthritis    "left knee" (08/05/2016)   Bruises easily    CAD (coronary artery disease) of artery bypass graft 08/05/2016   Around age 53. CABG - 3 vessel.    Chronic kidney disease    Colon polyp    COPD, 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   Alpha 1 studies normal per prior pulmonary group notes Smoked 40 years, quit in 2012 June 2016 PFT from prior  pulmonary group: "significant obstruction" FEV1 1.58L (47% pred), Residual volume 171% pred, DLCO 59% pred Simple Spirometry>> 09/15/2016 ratio 42% FEV1 1.02 L / 31%    Depression 2020   Emphysema of lung (HCC)    Essential tremor 06/11/2016   Plans to see Dr. Arbutus Leas   Former smoker 09/01/2016   Quit 2012. 40 pack years at least.    GERD (gastroesophageal reflux disease)    Headache    History of blood transfusion 08/1997   "when he had his heart surgery"   History of shingles 1970-2013 X 3   HTN (hypertension) 08/05/2016   Amlodipine 5mg , atenolol 50mg  BID, benazepril 20mg    Hyperlipemia    Hypothyroidism    Lumbar disc disease    MGUS (monoclonal gammopathy of unknown significance)    Myocardial infarction (HCC) 08/1997   Peripheral vascular disease (HCC)    PONV (postoperative nausea and vomiting)    Urinary tract infection 09/03/2017   Wears glasses    Past Surgical History:  Procedure Laterality Date   AORTOGRAM Right 08/31/2017   Procedure: AORTOGRAM BILATERAL PELVIC ANGIOGRAM WITH LEFT ILIAC ARTERY STENT;  Surgeon: Fransisco Hertz, MD;  Location: [facility] OR;  Service: Vascular;  Laterality: Right;   BACK SURGERY     CARDIAC CATHETERIZATION  08/1997   COLONOSCOPY  2014   CORONARY ARTERY BYPASS GRAFT  09/12/1997   "triple"   ESOPHAGOGASTRODUODENOSCOPY     INGUINAL HERNIA REPAIR Right 1961   KIDNEY REMOVED     KNEE RECONSTRUCTION Left 1960s - 1974 X 4   LAPAROSCOPIC ABLATION RENAL MASS  ~ 2014   "large abscess/pus ball"   LUMBAR DISC SURGERY  1990s   "they were wedged in the bowel"   PATELLA FRACTURE SURGERY  Left 1963   PATELLA FRACTURE SURGERY Left ~ 1965   "removed knee cap"   ROBOT ASSISTED LAPAROSCOPIC NEPHRECTOMY Left 10/07/2017   Procedure: XI ROBOTIC ASSISTED LAPAROSCOPIC NEPHRECTOMY OF PELVIC KIDNEY;  Surgeon: Sebastian Ache, MD;  Location: [facility];  Service: Urology;  Laterality: Left;  Place robot patient tower at foot of bed like prostate per Dr. Berneice Heinrich. Pull extra  staple loads.   TONSILLECTOMY     Social History   Social History Narrative   Lives with wife. 2 adopted daughters from [place].       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   Review of Systems As per HPI  Objective:   Physical Exam BP 122/70   Pulse 72   Ht 5\' 9"  (1.753 m)   Wt 151 lb (68.5 kg)   BMI 22.30 kg/m  Physical Exam   CHEST: Decreased breath sounds throughout the lung fields. CARDIOVASCULAR: Heart sounds are distant. Normal S1, S2. No murmurs or gallops. ABDOMEN: Ventral hernia present between two areas of surgical scars in the mid abdomen, soft, small, reducible, and nontender. Bowel sounds present. EXTREMITIES: Without edema.      Data reviewed includes recent pulmonary notes, prior GI records as above and as per HPI also primary care note of June 2024

## 2023-09-28 NOTE — Patient Instructions (Signed)
You have been scheduled for an endoscopy. Please follow written instructions given to you at your visit today.  If you use inhalers (even only as needed), please bring them with you on the day of your procedure.  If you take any of the following medications, they will need to be adjusted prior to your procedure:   DO NOT TAKE 7 DAYS PRIOR TO TEST- Trulicity (dulaglutide) Ozempic, Wegovy (semaglutide) Mounjaro (tirzepatide) Bydureon Bcise (exanatide extended release)  DO NOT TAKE 1 DAY PRIOR TO YOUR TEST Rybelsus (semaglutide) Adlyxin (lixisenatide) Victoza (liraglutide) Byetta (exanatide) ___________________________________________________________________________   We have sent the following medications to your pharmacy for you to pick up at your convenience: Mirtazapine   I appreciate the opportunity to care for you. Stan Head, MD, [facility]

## 2023-09-28 NOTE — Addendum Note (Signed)
Addended by: [place], Kenadee Gates E on: 09/28/2023 12:48 PM   Modules accepted: Orders

## 2023-10-15 ENCOUNTER — Encounter: Payer: Self-pay | Admitting: Family Medicine

## 2023-10-17 ENCOUNTER — Emergency Department (HOSPITAL_COMMUNITY): Payer: Medicare HMO

## 2023-10-17 ENCOUNTER — Other Ambulatory Visit: Payer: Self-pay

## 2023-10-17 ENCOUNTER — Inpatient Hospital Stay (HOSPITAL_COMMUNITY): Payer: Medicare HMO | Admitting: Certified Registered Nurse Anesthetist

## 2023-10-17 ENCOUNTER — Encounter (HOSPITAL_COMMUNITY)
Admission: EM | Disposition: A | Discharge status: Hospice | Payer: Self-pay | Source: Home / Self Care | Attending: Internal Medicine

## 2023-10-17 ENCOUNTER — Inpatient Hospital Stay (HOSPITAL_COMMUNITY): Payer: Medicare HMO

## 2023-10-17 ENCOUNTER — Inpatient Hospital Stay (HOSPITAL_COMMUNITY)
Admission: EM | Admit: 2023-10-17 | Discharge: 2023-10-21 | DRG: 521 | Disposition: A | Discharge status: Hospice | Payer: Medicare HMO | Attending: Internal Medicine | Admitting: Internal Medicine

## 2023-10-17 ENCOUNTER — Encounter (HOSPITAL_COMMUNITY): Payer: Self-pay | Admitting: Internal Medicine

## 2023-10-17 DIAGNOSIS — I10 Essential (primary) hypertension: Secondary | ICD-10-CM | POA: Diagnosis not present

## 2023-10-17 DIAGNOSIS — I2510 Atherosclerotic heart disease of native coronary artery without angina pectoris: Secondary | ICD-10-CM | POA: Diagnosis present

## 2023-10-17 DIAGNOSIS — R1310 Dysphagia, unspecified: Secondary | ICD-10-CM | POA: Diagnosis present

## 2023-10-17 DIAGNOSIS — Z951 Presence of aortocoronary bypass graft: Secondary | ICD-10-CM | POA: Diagnosis not present

## 2023-10-17 DIAGNOSIS — I129 Hypertensive chronic kidney disease with stage 1 through stage 4 chronic kidney disease, or unspecified chronic kidney disease: Secondary | ICD-10-CM | POA: Diagnosis present

## 2023-10-17 DIAGNOSIS — Z7951 Long term (current) use of inhaled steroids: Secondary | ICD-10-CM

## 2023-10-17 DIAGNOSIS — D631 Anemia in chronic kidney disease: Secondary | ICD-10-CM | POA: Diagnosis present

## 2023-10-17 DIAGNOSIS — R34 Anuria and oliguria: Secondary | ICD-10-CM | POA: Diagnosis not present

## 2023-10-17 DIAGNOSIS — I959 Hypotension, unspecified: Secondary | ICD-10-CM | POA: Diagnosis present

## 2023-10-17 DIAGNOSIS — E034 Atrophy of thyroid (acquired): Secondary | ICD-10-CM | POA: Diagnosis not present

## 2023-10-17 DIAGNOSIS — D62 Acute posthemorrhagic anemia: Secondary | ICD-10-CM | POA: Diagnosis not present

## 2023-10-17 DIAGNOSIS — W19XXXA Unspecified fall, initial encounter: Secondary | ICD-10-CM

## 2023-10-17 DIAGNOSIS — E039 Hypothyroidism, unspecified: Secondary | ICD-10-CM | POA: Insufficient documentation

## 2023-10-17 DIAGNOSIS — I739 Peripheral vascular disease, unspecified: Secondary | ICD-10-CM | POA: Diagnosis present

## 2023-10-17 DIAGNOSIS — Z87891 Personal history of nicotine dependence: Secondary | ICD-10-CM

## 2023-10-17 DIAGNOSIS — Z91041 Radiographic dye allergy status: Secondary | ICD-10-CM

## 2023-10-17 DIAGNOSIS — Z7982 Long term (current) use of aspirin: Secondary | ICD-10-CM

## 2023-10-17 DIAGNOSIS — W1830XA Fall on same level, unspecified, initial encounter: Secondary | ICD-10-CM | POA: Diagnosis present

## 2023-10-17 DIAGNOSIS — N184 Chronic kidney disease, stage 4 (severe): Secondary | ICD-10-CM

## 2023-10-17 DIAGNOSIS — J439 Emphysema, unspecified: Secondary | ICD-10-CM | POA: Diagnosis present

## 2023-10-17 DIAGNOSIS — S72002A Fracture of unspecified part of neck of left femur, initial encounter for closed fracture: Secondary | ICD-10-CM

## 2023-10-17 DIAGNOSIS — G9341 Metabolic encephalopathy: Secondary | ICD-10-CM | POA: Diagnosis not present

## 2023-10-17 DIAGNOSIS — I444 Left anterior fascicular block: Secondary | ICD-10-CM | POA: Diagnosis present

## 2023-10-17 DIAGNOSIS — K219 Gastro-esophageal reflux disease without esophagitis: Secondary | ICD-10-CM | POA: Diagnosis present

## 2023-10-17 DIAGNOSIS — S72009A Fracture of unspecified part of neck of unspecified femur, initial encounter for closed fracture: Secondary | ICD-10-CM | POA: Diagnosis present

## 2023-10-17 DIAGNOSIS — J9611 Chronic respiratory failure with hypoxia: Secondary | ICD-10-CM | POA: Diagnosis present

## 2023-10-17 DIAGNOSIS — N189 Chronic kidney disease, unspecified: Secondary | ICD-10-CM | POA: Insufficient documentation

## 2023-10-17 DIAGNOSIS — I252 Old myocardial infarction: Secondary | ICD-10-CM | POA: Diagnosis not present

## 2023-10-17 DIAGNOSIS — R339 Retention of urine, unspecified: Secondary | ICD-10-CM | POA: Diagnosis not present

## 2023-10-17 DIAGNOSIS — Z882 Allergy status to sulfonamides status: Secondary | ICD-10-CM

## 2023-10-17 DIAGNOSIS — E785 Hyperlipidemia, unspecified: Secondary | ICD-10-CM | POA: Diagnosis not present

## 2023-10-17 DIAGNOSIS — G250 Essential tremor: Secondary | ICD-10-CM | POA: Diagnosis present

## 2023-10-17 DIAGNOSIS — Z66 Do not resuscitate: Secondary | ICD-10-CM | POA: Diagnosis present

## 2023-10-17 DIAGNOSIS — Z9981 Dependence on supplemental oxygen: Secondary | ICD-10-CM

## 2023-10-17 DIAGNOSIS — Z811 Family history of alcohol abuse and dependence: Secondary | ICD-10-CM

## 2023-10-17 DIAGNOSIS — Z79899 Other long term (current) drug therapy: Secondary | ICD-10-CM

## 2023-10-17 DIAGNOSIS — N179 Acute kidney failure, unspecified: Secondary | ICD-10-CM | POA: Diagnosis present

## 2023-10-17 DIAGNOSIS — G8929 Other chronic pain: Secondary | ICD-10-CM | POA: Diagnosis present

## 2023-10-17 DIAGNOSIS — Z515 Encounter for palliative care: Secondary | ICD-10-CM | POA: Diagnosis not present

## 2023-10-17 DIAGNOSIS — Z888 Allergy status to other drugs, medicaments and biological substances status: Secondary | ICD-10-CM

## 2023-10-17 DIAGNOSIS — E875 Hyperkalemia: Secondary | ICD-10-CM | POA: Diagnosis not present

## 2023-10-17 DIAGNOSIS — Z79890 Hormone replacement therapy (postmenopausal): Secondary | ICD-10-CM

## 2023-10-17 DIAGNOSIS — Y92009 Unspecified place in unspecified non-institutional (private) residence as the place of occurrence of the external cause: Secondary | ICD-10-CM | POA: Diagnosis not present

## 2023-10-17 DIAGNOSIS — D472 Monoclonal gammopathy: Secondary | ICD-10-CM | POA: Diagnosis present

## 2023-10-17 DIAGNOSIS — Z7189 Other specified counseling: Secondary | ICD-10-CM | POA: Diagnosis not present

## 2023-10-17 DIAGNOSIS — Z905 Acquired absence of kidney: Secondary | ICD-10-CM

## 2023-10-17 DIAGNOSIS — Z881 Allergy status to other antibiotic agents status: Secondary | ICD-10-CM

## 2023-10-17 HISTORY — PX: TOTAL HIP ARTHROPLASTY: SHX124

## 2023-10-17 LAB — ETHANOL: Alcohol, Ethyl (B): <10 mg/dL (ref <10)

## 2023-10-17 LAB — CBC
HCT: 34.5 % — ABNORMAL LOW (ref 39.0–52.0)
Hemoglobin: 10.3 g/dL — ABNORMAL LOW (ref 13.0–17.0)
MCH: 30.3 pg (ref 26.0–34.0)
MCHC: 29.9 g/dL — ABNORMAL LOW (ref 30.0–36.0)
MCV: 101.5 fL — ABNORMAL HIGH (ref 80.0–100.0)
Platelets: 217 10*3/uL (ref 150–400)
RBC: 3.4 MIL/uL — ABNORMAL LOW (ref 4.22–5.81)
RDW: 15.7 % — ABNORMAL HIGH (ref 11.5–15.5)
WBC: 6.5 10*3/uL (ref 4.0–10.5)
nRBC: 0 % (ref 0.0–0.2)

## 2023-10-17 LAB — SURGICAL PCR SCREEN
MRSA, PCR: NEGATIVE
Staphylococcus aureus: POSITIVE — AB

## 2023-10-17 LAB — COMPREHENSIVE METABOLIC PANEL
ALT: 12 U/L (ref 0–44)
AST: 21 U/L (ref 15–41)
Albumin: 2.6 g/dL — ABNORMAL LOW (ref 3.5–5.0)
Alkaline Phosphatase: 63 U/L (ref 38–126)
Anion gap: 10 (ref 5–15)
BUN: 36 mg/dL — ABNORMAL HIGH (ref 8–23)
CO2: 26 mmol/L (ref 22–32)
Calcium: 8.1 mg/dL — ABNORMAL LOW (ref 8.9–10.3)
Chloride: 101 mmol/L (ref 98–111)
Creatinine, Ser: 5.05 mg/dL — ABNORMAL HIGH (ref 0.61–1.24)
GFR, Estimated: 11 mL/min — ABNORMAL LOW (ref >60)
Glucose, Bld: 86 mg/dL (ref 70–99)
Potassium: 4.2 mmol/L (ref 3.5–5.1)
Sodium: 137 mmol/L (ref 135–145)
Total Bilirubin: 0.8 mg/dL (ref <1.2)
Total Protein: 6.9 g/dL (ref 6.5–8.1)

## 2023-10-17 LAB — TYPE AND SCREEN
ABO/RH(D): A POS
Antibody Screen: NEGATIVE

## 2023-10-17 LAB — CK: Total CK: 751 U/L — ABNORMAL HIGH (ref 49–397)

## 2023-10-17 LAB — I-STAT CHEM 8, ED
BUN: 40 mg/dL — ABNORMAL HIGH (ref 8–23)
Calcium, Ion: 0.99 mmol/L — ABNORMAL LOW (ref 1.15–1.40)
Chloride: 101 mmol/L (ref 98–111)
Creatinine, Ser: 5 mg/dL — ABNORMAL HIGH (ref 0.61–1.24)
Glucose, Bld: 84 mg/dL (ref 70–99)
HCT: 33 % — ABNORMAL LOW (ref 39.0–52.0)
Hemoglobin: 11.2 g/dL — ABNORMAL LOW (ref 13.0–17.0)
Potassium: 4.2 mmol/L (ref 3.5–5.1)
Sodium: 138 mmol/L (ref 135–145)
TCO2: 26 mmol/L (ref 22–32)

## 2023-10-17 LAB — I-STAT CG4 LACTIC ACID, ED: Lactic Acid, Venous: 1 mmol/L (ref 0.5–1.9)

## 2023-10-17 LAB — PROTIME-INR
INR: 1.1 (ref 0.8–1.2)
Prothrombin Time: 14 s (ref 11.4–15.2)

## 2023-10-17 LAB — SAMPLE TO BLOOD BANK

## 2023-10-17 SURGERY — ARTHROPLASTY, HIP, TOTAL,POSTERIOR APPROACH
Anesthesia: General | Site: Hip | Laterality: Left

## 2023-10-17 MED ORDER — TRANEXAMIC ACID-NACL 1000-0.7 MG/100ML-% IV SOLN
1000.0000 mg | INTRAVENOUS | Status: AC
  Administered 2023-10-17: 1000 mg via INTRAVENOUS

## 2023-10-17 MED ORDER — UMECLIDINIUM BROMIDE 62.5 MCG/ACT IN AEPB
1.0000 | INHALATION_SPRAY | Freq: Every day | RESPIRATORY_TRACT | Status: DC
  Administered 2023-10-19 – 2023-10-21 (×3): 1 via RESPIRATORY_TRACT
  Filled 2023-10-17: qty 7

## 2023-10-17 MED ORDER — DEXAMETHASONE SODIUM PHOSPHATE 10 MG/ML IJ SOLN
INTRAMUSCULAR | Status: AC
  Filled 2023-10-17: qty 3

## 2023-10-17 MED ORDER — METHOCARBAMOL 1000 MG/10ML IJ SOLN: 500.0000 mg | Freq: Four times a day (QID) | INTRAMUSCULAR | Status: DC | PRN

## 2023-10-17 MED ORDER — ENOXAPARIN SODIUM 40 MG/0.4ML IJ SOSY: 40.0000 mg | PREFILLED_SYRINGE | INTRAMUSCULAR | Status: DC

## 2023-10-17 MED ORDER — ORAL CARE MOUTH RINSE: 15.0000 mL | Freq: Once | OROMUCOSAL | Status: AC

## 2023-10-17 MED ORDER — SODIUM CHLORIDE 0.9 % IV BOLUS
500.0000 mL | Freq: Once | INTRAVENOUS | Status: AC
  Administered 2023-10-17: 500 mL via INTRAVENOUS

## 2023-10-17 MED ORDER — VANCOMYCIN HCL IN DEXTROSE 1-5 GM/200ML-% IV SOLN
1000.0000 mg | Freq: Once | INTRAVENOUS | Status: AC
  Administered 2023-10-18: 1000 mg via INTRAVENOUS
  Filled 2023-10-17: qty 200

## 2023-10-17 MED ORDER — ALBUTEROL SULFATE HFA 108 (90 BASE) MCG/ACT IN AERS
INHALATION_SPRAY | RESPIRATORY_TRACT | Status: DC | PRN
  Administered 2023-10-17: 6 via RESPIRATORY_TRACT

## 2023-10-17 MED ORDER — ONDANSETRON HCL 4 MG/2ML IJ SOLN
4.0000 mg | Freq: Once | INTRAMUSCULAR | Status: AC
  Administered 2023-10-17: 4 mg via INTRAVENOUS
  Filled 2023-10-17: qty 2

## 2023-10-17 MED ORDER — SODIUM CHLORIDE 0.9 % IV SOLN: INTRAVENOUS | Status: DC

## 2023-10-17 MED ORDER — PROPOFOL 500 MG/50ML IV EMUL
INTRAVENOUS | Status: DC | PRN
  Administered 2023-10-17: 25 ug/kg/min via INTRAVENOUS

## 2023-10-17 MED ORDER — OXYCODONE HCL 5 MG PO TABS
5.0000 mg | ORAL_TABLET | ORAL | Status: DC | PRN
  Administered 2023-10-17 – 2023-10-18 (×2): 10 mg via ORAL
  Administered 2023-10-19: 5 mg via ORAL
  Administered 2023-10-20: 10 mg via ORAL
  Filled 2023-10-17 (×2): qty 2
  Filled 2023-10-17: qty 1
  Filled 2023-10-17: qty 2

## 2023-10-17 MED ORDER — SODIUM CHLORIDE 0.9 % IR SOLN
Status: DC | PRN
  Administered 2023-10-17: 3000 mL

## 2023-10-17 MED ORDER — FENTANYL CITRATE PF 50 MCG/ML IJ SOSY
50.0000 ug | PREFILLED_SYRINGE | Freq: Once | INTRAMUSCULAR | Status: AC
  Administered 2023-10-17: 50 ug via INTRAVENOUS
  Filled 2023-10-17: qty 1

## 2023-10-17 MED ORDER — ROCURONIUM BROMIDE 10 MG/ML (PF) SYRINGE
PREFILLED_SYRINGE | INTRAVENOUS | Status: AC
Start: 2023-10-17 — End: ?
  Filled 2023-10-17: qty 20

## 2023-10-17 MED ORDER — LIDOCAINE 2% (20 MG/ML) 5 ML SYRINGE
INTRAMUSCULAR | Status: AC
  Filled 2023-10-17: qty 10

## 2023-10-17 MED ORDER — CHLORHEXIDINE GLUCONATE 4 % EX SOLN
60.0000 mL | Freq: Once | CUTANEOUS | Status: DC
Start: 2023-10-17 — End: 2023-10-17

## 2023-10-17 MED ORDER — EPHEDRINE 5 MG/ML INJ
INTRAVENOUS | Status: AC
  Filled 2023-10-17: qty 5

## 2023-10-17 MED ORDER — ENOXAPARIN SODIUM 40 MG/0.4ML IJ SOSY
30.0000 mg | PREFILLED_SYRINGE | INTRAMUSCULAR | Status: DC
  Administered 2023-10-18 – 2023-10-20 (×3): 30 mg via SUBCUTANEOUS
  Filled 2023-10-17 (×3): qty 0.4

## 2023-10-17 MED ORDER — LEVOTHYROXINE SODIUM 88 MCG PO TABS
88.0000 ug | ORAL_TABLET | Freq: Every day | ORAL | Status: DC
  Administered 2023-10-18 – 2023-10-21 (×4): 88 ug via ORAL
  Filled 2023-10-17 (×4): qty 1

## 2023-10-17 MED ORDER — CHLORHEXIDINE GLUCONATE 0.12 % MT SOLN
OROMUCOSAL | Status: AC
  Administered 2023-10-17: 15 mL via OROMUCOSAL
  Filled 2023-10-17: qty 15

## 2023-10-17 MED ORDER — CYCLOBENZAPRINE HCL 10 MG PO TABS
10.0000 mg | ORAL_TABLET | Freq: Once | ORAL | Status: AC
Start: 2023-10-17 — End: 2023-10-17
  Administered 2023-10-17: 10 mg via ORAL
  Filled 2023-10-17: qty 1

## 2023-10-17 MED ORDER — HYDROMORPHONE HCL 1 MG/ML IJ SOLN
0.5000 mg | Freq: Once | INTRAMUSCULAR | Status: AC
  Administered 2023-10-17: 0.5 mg via INTRAVENOUS
  Filled 2023-10-17: qty 1

## 2023-10-17 MED ORDER — POLYETHYLENE GLYCOL 3350 17 G PO PACK
17.0000 g | PACK | Freq: Every day | ORAL | Status: DC | PRN
  Administered 2023-10-18 – 2023-10-20 (×2): 17 g via ORAL
  Filled 2023-10-17 (×2): qty 1

## 2023-10-17 MED ORDER — ACETAMINOPHEN 500 MG PO TABS
500.0000 mg | ORAL_TABLET | Freq: Four times a day (QID) | ORAL | Status: DC | PRN
Start: 2023-10-17 — End: 2023-10-21
  Administered 2023-10-18 (×2): 1000 mg via ORAL
  Filled 2023-10-17 (×2): qty 2

## 2023-10-17 MED ORDER — DEXAMETHASONE SODIUM PHOSPHATE 10 MG/ML IJ SOLN
INTRAMUSCULAR | Status: DC | PRN
  Administered 2023-10-17: 10 mg via INTRAVENOUS

## 2023-10-17 MED ORDER — MORPHINE SULFATE (PF) 2 MG/ML IV SOLN: 0.5000 mg | INTRAVENOUS | Status: DC | PRN

## 2023-10-17 MED ORDER — MORPHINE SULFATE (PF) 4 MG/ML IV SOLN
4.0000 mg | Freq: Once | INTRAVENOUS | Status: AC
  Administered 2023-10-17: 4 mg via INTRAVENOUS
  Filled 2023-10-17: qty 1

## 2023-10-17 MED ORDER — FENTANYL CITRATE (PF) 250 MCG/5ML IJ SOLN
INTRAMUSCULAR | Status: DC | PRN
  Administered 2023-10-17: 50 ug via INTRAVENOUS

## 2023-10-17 MED ORDER — PHENYLEPHRINE 80 MCG/ML (10ML) SYRINGE FOR IV PUSH (FOR BLOOD PRESSURE SUPPORT)
PREFILLED_SYRINGE | INTRAVENOUS | Status: AC
  Filled 2023-10-17: qty 20

## 2023-10-17 MED ORDER — FENTANYL CITRATE (PF) 100 MCG/2ML IJ SOLN
INTRAMUSCULAR | Status: AC
  Filled 2023-10-17: qty 2

## 2023-10-17 MED ORDER — DOCUSATE SODIUM 100 MG PO CAPS
100.0000 mg | ORAL_CAPSULE | Freq: Two times a day (BID) | ORAL | Status: DC
  Administered 2023-10-17 – 2023-10-20 (×7): 100 mg via ORAL
  Filled 2023-10-17 (×7): qty 1

## 2023-10-17 MED ORDER — TIOTROPIUM BROMIDE MONOHYDRATE 2.5 MCG/ACT IN AERS: INHALATION_SPRAY | Freq: Every day | RESPIRATORY_TRACT | Status: DC

## 2023-10-17 MED ORDER — METHOCARBAMOL 500 MG PO TABS
500.0000 mg | ORAL_TABLET | Freq: Four times a day (QID) | ORAL | Status: DC | PRN
  Administered 2023-10-17 – 2023-10-19 (×4): 500 mg via ORAL
  Filled 2023-10-17 (×4): qty 1

## 2023-10-17 MED ORDER — 0.9 % SODIUM CHLORIDE (POUR BTL) OPTIME
TOPICAL | Status: DC | PRN
  Administered 2023-10-17: 1000 mL

## 2023-10-17 MED ORDER — VANCOMYCIN HCL IN DEXTROSE 1-5 GM/200ML-% IV SOLN: 1000.0000 mg | INTRAVENOUS | Status: AC

## 2023-10-17 MED ORDER — BUPIVACAINE-EPINEPHRINE (PF) 0.25% -1:200000 IJ SOLN
INTRAMUSCULAR | Status: AC
  Filled 2023-10-17: qty 30

## 2023-10-17 MED ORDER — ROSUVASTATIN CALCIUM 20 MG PO TABS
40.0000 mg | ORAL_TABLET | Freq: Every day | ORAL | Status: DC
  Administered 2023-10-18 – 2023-10-20 (×3): 40 mg via ORAL
  Filled 2023-10-17 (×3): qty 2

## 2023-10-17 MED ORDER — ONDANSETRON HCL 4 MG/2ML IJ SOLN
INTRAMUSCULAR | Status: AC
  Filled 2023-10-17: qty 4

## 2023-10-17 MED ORDER — PROPOFOL 10 MG/ML IV BOLUS
INTRAVENOUS | Status: DC | PRN
  Administered 2023-10-17: 70 mg via INTRAVENOUS

## 2023-10-17 MED ORDER — SODIUM CHLORIDE 0.9 % IV SOLN: INTRAVENOUS | Status: AC

## 2023-10-17 MED ORDER — EPHEDRINE SULFATE-NACL 50-0.9 MG/10ML-% IV SOSY
PREFILLED_SYRINGE | INTRAVENOUS | Status: DC | PRN
  Administered 2023-10-17: 25 mg via INTRAVENOUS

## 2023-10-17 MED ORDER — ONDANSETRON HCL 4 MG/2ML IJ SOLN
INTRAMUSCULAR | Status: DC | PRN
  Administered 2023-10-17: 4 mg via INTRAVENOUS

## 2023-10-17 MED ORDER — LACTATED RINGERS IV SOLN: INTRAVENOUS | Status: DC | PRN

## 2023-10-17 MED ORDER — FENTANYL CITRATE (PF) 100 MCG/2ML IJ SOLN
50.0000 ug | Freq: Once | INTRAMUSCULAR | Status: AC
  Administered 2023-10-17: 50 ug via INTRAVENOUS

## 2023-10-17 MED ORDER — ALBUMIN HUMAN 5 % IV SOLN: INTRAVENOUS | Status: DC | PRN

## 2023-10-17 MED ORDER — HEPARIN SODIUM (PORCINE) 5000 UNIT/ML IJ SOLN: 5000.0000 [IU] | Freq: Three times a day (TID) | INTRAMUSCULAR | Status: DC

## 2023-10-17 MED ORDER — PHENYLEPHRINE HCL-NACL 20-0.9 MG/250ML-% IV SOLN
INTRAVENOUS | Status: DC | PRN
  Administered 2023-10-17: 80 ug/min via INTRAVENOUS

## 2023-10-17 MED ORDER — MIRTAZAPINE 15 MG PO TABS
15.0000 mg | ORAL_TABLET | Freq: Every day | ORAL | Status: DC
  Administered 2023-10-17 – 2023-10-19 (×3): 15 mg via ORAL
  Filled 2023-10-17 (×3): qty 1

## 2023-10-17 MED ORDER — ALBUTEROL SULFATE (2.5 MG/3ML) 0.083% IN NEBU
2.5000 mg | INHALATION_SOLUTION | RESPIRATORY_TRACT | Status: DC | PRN
  Administered 2023-10-20: 2.5 mg via RESPIRATORY_TRACT
  Filled 2023-10-17: qty 3

## 2023-10-17 MED ORDER — POVIDONE-IODINE 10 % EX SWAB: 2.0000 | Freq: Once | CUTANEOUS | Status: DC

## 2023-10-17 MED ORDER — LIDOCAINE 2% (20 MG/ML) 5 ML SYRINGE
INTRAMUSCULAR | Status: DC | PRN
  Administered 2023-10-17: 60 mg via INTRAVENOUS

## 2023-10-17 MED ORDER — TRANEXAMIC ACID-NACL 1000-0.7 MG/100ML-% IV SOLN
INTRAVENOUS | Status: AC
  Filled 2023-10-17: qty 100

## 2023-10-17 MED ORDER — FENTANYL CITRATE (PF) 250 MCG/5ML IJ SOLN
INTRAMUSCULAR | Status: AC
  Filled 2023-10-17: qty 5

## 2023-10-17 MED ORDER — ROCURONIUM BROMIDE 10 MG/ML (PF) SYRINGE
PREFILLED_SYRINGE | INTRAVENOUS | Status: DC | PRN
  Administered 2023-10-17: 50 mg via INTRAVENOUS

## 2023-10-17 MED ORDER — SODIUM CHLORIDE (PF) 0.9 % IJ SOLN
INTRAMUSCULAR | Status: DC | PRN
  Administered 2023-10-17: 60 mL via SURGICAL_CAVITY

## 2023-10-17 MED ORDER — PHENYLEPHRINE 80 MCG/ML (10ML) SYRINGE FOR IV PUSH (FOR BLOOD PRESSURE SUPPORT)
PREFILLED_SYRINGE | INTRAVENOUS | Status: DC | PRN
  Administered 2023-10-17: 640 ug via INTRAVENOUS
  Administered 2023-10-17: 240 ug via INTRAVENOUS

## 2023-10-17 MED ORDER — CHLORHEXIDINE GLUCONATE 0.12 % MT SOLN: 15.0000 mL | Freq: Once | OROMUCOSAL | Status: AC

## 2023-10-17 MED ORDER — SUGAMMADEX SODIUM 200 MG/2ML IV SOLN
INTRAVENOUS | Status: DC | PRN
  Administered 2023-10-17: 136 mg via INTRAVENOUS

## 2023-10-17 MED ORDER — VANCOMYCIN HCL IN DEXTROSE 1-5 GM/200ML-% IV SOLN
INTRAVENOUS | Status: AC
  Administered 2023-10-17: 1000 mg via INTRAVENOUS
  Filled 2023-10-17: qty 200

## 2023-10-17 MED ORDER — BUPIVACAINE LIPOSOME 1.3 % IJ SUSP
INTRAMUSCULAR | Status: AC
  Filled 2023-10-17: qty 20

## 2023-10-17 MED ORDER — GENTAMICIN SULFATE 40 MG/ML IJ SOLN
5.0000 mg/kg | INTRAVENOUS | Status: AC
  Administered 2023-10-17: 340 mg via INTRAVENOUS
  Filled 2023-10-17: qty 8.5

## 2023-10-17 MED ORDER — MOMETASONE FURO-FORMOTEROL FUM 200-5 MCG/ACT IN AERO
2.0000 | INHALATION_SPRAY | Freq: Two times a day (BID) | RESPIRATORY_TRACT | Status: DC
  Administered 2023-10-17 – 2023-10-21 (×8): 2 via RESPIRATORY_TRACT
  Filled 2023-10-17 (×3): qty 8.8

## 2023-10-17 SURGICAL SUPPLY — 60 items
BIT DRILL TRIDENT 4X40 SU (BIT) IMPLANT
BLADE SAGITTAL 25.0X1.27X90 (BLADE) ×1 IMPLANT
BRUSH FEMORAL CANAL (MISCELLANEOUS) IMPLANT
CHLORAPREP W/TINT 26 (MISCELLANEOUS) ×2 IMPLANT
COVER SURGICAL LIGHT HANDLE (MISCELLANEOUS) ×1 IMPLANT
DERMABOND ADVANCED .7 DNX12 (GAUZE/BANDAGES/DRESSINGS) ×1 IMPLANT
DRAPE HALF SHEET 40X57 (DRAPES) ×1 IMPLANT
DRAPE HIP W/POCKET STRL (MISCELLANEOUS) ×1 IMPLANT
DRAPE INCISE IOBAN 66X45 STRL (DRAPES) ×1 IMPLANT
DRAPE INCISE IOBAN 85X60 (DRAPES) ×1 IMPLANT
DRAPE POUCH INSTRU U-SHP 10X18 (DRAPES) ×1 IMPLANT
DRAPE U-SHAPE 47X51 STRL (DRAPES) ×2 IMPLANT
DRSG AQUACEL AG ADV 3.5X10 (GAUZE/BANDAGES/DRESSINGS) ×1 IMPLANT
ELECT BLADE 4.0 EZ CLEAN MEGAD (MISCELLANEOUS) ×1
ELECT REM PT RETURN 15FT ADLT (MISCELLANEOUS) ×1 IMPLANT
ELECTRODE BLDE 4.0 EZ CLN MEGD (MISCELLANEOUS) ×1 IMPLANT
GLOVE BIO SURGEON STRL SZ 6.5 (GLOVE) ×1 IMPLANT
GLOVE BIOGEL PI IND STRL 6.5 (GLOVE) ×1 IMPLANT
GLOVE SRG 8 PF TXTR STRL LF DI (GLOVE) ×1 IMPLANT
GLOVE SURG ORTHO LTX SZ8 (GLOVE) ×2 IMPLANT
GOWN STRL REUS W/ TWL LRG LVL3 (GOWN DISPOSABLE) ×1 IMPLANT
GOWN STRL REUS W/ TWL XL LVL3 (GOWN DISPOSABLE) ×2 IMPLANT
HEAD BIOLOX HIP 36/-2.5 (Joint) IMPLANT
HIP BIOLOX HD 36/-2.5 (Joint) ×1 IMPLANT
HOOD PEEL AWAY T7 (MISCELLANEOUS) ×3 IMPLANT
INSERT TRIDENT POLY 36 0DEG (Insert) IMPLANT
KIT BASIN OR (CUSTOM PROCEDURE TRAY) ×1 IMPLANT
KIT TURNOVER KIT A (KITS) ×1 IMPLANT
MANIFOLD NEPTUNE II (INSTRUMENTS) ×1 IMPLANT
MARKER SKIN DUAL TIP RULER LAB (MISCELLANEOUS) ×1 IMPLANT
NDL 18GX1X1/2 (RX/OR ONLY) (NEEDLE) ×2 IMPLANT
NEEDLE 18GX1X1/2 (RX/OR ONLY) (NEEDLE) ×2 IMPLANT
NS IRRIG 1000ML POUR BTL (IV SOLUTION) ×1 IMPLANT
PACK TOTAL JOINT (CUSTOM PROCEDURE TRAY) ×1 IMPLANT
PRESSURIZER FEMORAL UNIV (MISCELLANEOUS) ×1 IMPLANT
RETRIEVER SUT HEWSON (MISCELLANEOUS) ×1 IMPLANT
SCREW HEX LP 6.5X25 (Screw) IMPLANT
SCREW HEX LP 6.5X35 (Screw) IMPLANT
SEALER BIPOLAR AQUA 6.0 (INSTRUMENTS) IMPLANT
SET HNDPC FAN SPRY TIP SCT (DISPOSABLE) IMPLANT
SET INTERPULSE LAVAGE W/TIP (ORTHOPEDIC DISPOSABLE SUPPLIES) IMPLANT
SHELL CLUSTERHOLE ACETABULAR 5 (Shell) IMPLANT
SPONGE T-LAP 18X18 ~~LOC~~+RFID (SPONGE) IMPLANT
STEM ACCOLADE II SZ8 (Stem) IMPLANT
SUCTION TUBE FRAZIER 10FR DISP (SUCTIONS) ×1 IMPLANT
SUCTION TUBE FRAZIER 12FR DISP (SUCTIONS) IMPLANT
SUT BONE WAX W31G (SUTURE) ×1 IMPLANT
SUT ETHIBOND 2 V 37 (SUTURE) ×1 IMPLANT
SUT MNCRL AB 3-0 PS2 27 (SUTURE) ×1 IMPLANT
SUT STRATAFIX 1PDS 45CM VIOLET (SUTURE) ×2 IMPLANT
SUT VIC AB 0 CT1 27XBRD ANBCTR (SUTURE) ×1 IMPLANT
SUT VIC AB 2-0 CT2 27 (SUTURE) ×2 IMPLANT
SUT VIC AB CT1 27XBRD ANBCTRL (SUTURE) ×1
SYR 20ML LL LF (SYRINGE) ×1 IMPLANT
SYR 50ML LL SCALE MARK (SYRINGE) ×1 IMPLANT
TOWEL GREEN STERILE (TOWEL DISPOSABLE) ×1 IMPLANT
TOWER CARTRIDGE SMART MIX (DISPOSABLE) IMPLANT
TRAY FOLEY MTR SLVR 16FR STAT (SET/KITS/TRAYS/PACK) IMPLANT
TUBE SUCT ARGYLE STRL (TUBING) ×1 IMPLANT
WATER STERILE IRR 1000ML POUR (IV SOLUTION) ×1 IMPLANT

## 2023-10-17 NOTE — Progress Notes (Signed)
Pt seen in room, somnolent, family at bedside, Pt's guide/menu provided to pt's family with instructions. Family verbalized understanding of instructions .Hospital valuables policy has been reinforced with no complaints. Pt& family oriented to room/equipments. Hospital bed in lowest position with 3 side rails up, call bell/room phone within reach.and all wheels locked.

## 2023-10-17 NOTE — Discharge Instructions (Signed)
INSTRUCTIONS AFTER JOINT REPLACEMENT   Remove items at home which could result in a fall. This includes throw rugs or furniture in walking pathways ICE to the affected joint every three hours while awake for 30 minutes at a time, for at least the first 3-5 days, and then as needed for pain and swelling.  Continue to use ice for pain and swelling. You may notice swelling that will progress down to the foot and ankle.  This is normal after surgery.  Elevate your leg when you are not up walking on it.   Continue to use the breathing machine you got in the hospital (incentive spirometer) which will help keep your temperature down.  It is common for your temperature to cycle up and down following surgery, especially at night when you are not up moving around and exerting yourself.  The breathing machine keeps your lungs expanded and your temperature down.   DIET:  As you were doing prior to hospitalization, we recommend a well-balanced diet.  DRESSING / WOUND CARE / SHOWERING  Keep the surgical dressing until follow up.  The dressing is water proof, so you can shower without any extra covering.  IF THE DRESSING FALLS OFF or the wound gets wet inside, change the dressing with sterile gauze.  Please use good hand washing techniques before changing the dressing.  Do not use any lotions or creams on the incision until instructed by your surgeon.    ACTIVITY  Increase activity slowly as tolerated, but follow the weight bearing instructions below.   No driving for 6 weeks or until further direction given by your physician.  You cannot drive while taking narcotics.  No lifting or carrying greater than 10 lbs. until further directed by your surgeon. Avoid periods of inactivity such as sitting longer than an hour when not asleep. This helps prevent blood clots.  You may return to work once you are authorized by your doctor.     WEIGHT BEARING   Weight bearing as tolerated with assist device (walker, cane,  etc) as directed, use it as long as suggested by your surgeon or therapist, typically at least 4-6 weeks.   EXERCISES  Results after joint replacement surgery are often greatly improved when you follow the exercise, range of motion and muscle strengthening exercises prescribed by your doctor. Safety measures are also important to protect the joint from further injury. Any time any of these exercises cause you to have increased pain or swelling, decrease what you are doing until you are comfortable again and then slowly increase them. If you have problems or questions, call your caregiver or physical therapist for advice.   Rehabilitation is important following a joint replacement. After just a few days of immobilization, the muscles of the leg can become weakened and shrink (atrophy).  These exercises are designed to build up the tone and strength of the thigh and leg muscles and to improve motion. Often times heat used for twenty to thirty minutes before working out will loosen up your tissues and help with improving the range of motion but do not use heat for the first two weeks following surgery (sometimes heat can increase post-operative swelling).   These exercises can be done on a training (exercise) mat, on the floor, on a table or on a bed. Use whatever works the best and is most comfortable for you.    Use music or television while you are exercising so that the exercises are a pleasant break in your  day. This will make your life better with the exercises acting as a break in your routine that you can look forward to.   Perform all exercises about fifteen times, three times per day or as directed.  You should exercise both the operative leg and the other leg as well.  Exercises include:   Quad Sets - Tighten up the muscle on the front of the thigh (Quad) and hold for 5-10 seconds.   Straight Leg Raises - With your knee straight (if you were given a brace, keep it on), lift the leg to 60  degrees, hold for 3 seconds, and slowly lower the leg.  Perform this exercise against resistance later as your leg gets stronger.  Leg Slides: Lying on your back, slowly slide your foot toward your buttocks, bending your knee up off the floor (only go as far as is comfortable). Then slowly slide your foot back down until your leg is flat on the floor again.  Angel Wings: Lying on your back spread your legs to the side as far apart as you can without causing discomfort.  Hamstring Strength:  Lying on your back, push your heel against the floor with your leg straight by tightening up the muscles of your buttocks.  Repeat, but this time bend your knee to a comfortable angle, and push your heel against the floor.  You may put a pillow under the heel to make it more comfortable if necessary.   A rehabilitation program following joint replacement surgery can speed recovery and prevent re-injury in the future due to weakened muscles. Contact your doctor or a physical therapist for more information on knee rehabilitation.    CONSTIPATION  Constipation is defined medically as fewer than three stools per week and severe constipation as less than one stool per week.  Even if you have a regular bowel pattern at home, your normal regimen is likely to be disrupted due to multiple reasons following surgery.  Combination of anesthesia, postoperative narcotics, change in appetite and fluid intake all can affect your bowels.   YOU MUST use at least one of the following options; they are listed in order of increasing strength to get the job done.  They are all available over the counter, and you may need to use some, POSSIBLY even all of these options:    Drink plenty of fluids (prune juice may be helpful) and high fiber foods Colace 100 mg by mouth twice a day  Senokot for constipation as directed and as needed Dulcolax (bisacodyl), take with full glass of water  Miralax (polyethylene glycol) once or twice a day as  needed.  If you have tried all these things and are unable to have a bowel movement in the first 3-4 days after surgery call either your surgeon or your primary doctor.    If you experience loose stools or diarrhea, hold the medications until you stool forms back up.  If your symptoms do not get better within 1 week or if they get worse, check with your doctor.  If you experience "the worst abdominal pain ever" or develop nausea or vomiting, please contact the office immediately for further recommendations for treatment.   ITCHING:  If you experience itching with your medications, try taking only a single pain pill, or even half a pain pill at a time.  You can also use Benadryl over the counter for itching or also to help with sleep.   TED HOSE STOCKINGS:  Use stockings on both  legs until for at least 2 weeks or as directed by physician office. They may be removed at night for sleeping.  MEDICATIONS:  See your medication summary on the "After Visit Summary" that nursing will review with you.  You may have some home medications which will be placed on hold until you complete the course of blood thinner medication.  It is important for you to complete the blood thinner medication as prescribed.   Blood clot prevention (DVT Prophylaxis): After surgery you are at an increased risk for a blood clot. you were prescribed a blood thinner, Aspirin 81mg , to be taken twice daily for a total of 4 weeks from surgery to help reduce your risk of getting a blood clot. This will help prevent a blood clot. Signs of a pulmonary embolus (blood clot in the lungs) include sudden short of breath, feeling lightheaded or dizzy, chest pain with a deep breath, rapid pulse rapid breathing. Signs of a blood clot in your arms or legs include new unexplained swelling and cramping, warm, red or darkened skin around the painful area. Please call the office or 911 right away if these signs or symptoms develop.  PRECAUTIONS:  If you  experience chest pain or shortness of breath - call 911 immediately for transfer to the hospital emergency department.   If you develop a fever greater that 101 F, purulent drainage from wound, increased redness or drainage from wound, foul odor from the wound/dressing, or calf pain - CONTACT YOUR SURGEON.                                                   FOLLOW-UP APPOINTMENTS:  If you do not already have a post-op appointment, please call the office for an appointment to be seen by your surgeon.  Guidelines for how soon to be seen are listed in your "After Visit Summary", but are typically between 2-3 weeks after surgery.   POST-OPERATIVE OPIOID TAPER INSTRUCTIONS: It is important to wean off of your opioid medication as soon as possible. If you do not need pain medication after your surgery it is ok to stop day one. Opioids include: Codeine, Hydrocodone(Norco, Vicodin), Oxycodone(Percocet, oxycontin) and hydromorphone amongst others.  Long term and even short term use of opiods can cause: Increased pain response Dependence Constipation Depression Respiratory depression And more.  Withdrawal symptoms can include Flu like symptoms Nausea, vomiting And more Techniques to manage these symptoms Hydrate well Eat regular healthy meals Stay active Use relaxation techniques(deep breathing, meditating, yoga) Do Not substitute Alcohol to help with tapering If you have been on opioids for less than two weeks and do not have pain than it is ok to stop all together.  Plan to wean off of opioids This plan should start within one week post op of your joint replacement. Maintain the same interval or time between taking each dose and first decrease the dose.  Cut the total daily intake of opioids by one tablet each day Next start to increase the time between doses. The last dose that should be eliminated is the evening dose.   MAKE SURE YOU:  Understand these instructions.  Get help right away  if you are not doing well or get worse.    Thank you for letting us be a part of your medical care team.  It is a privilege  we respect greatly.  We hope these instructions will help you stay on track for a fast and full recovery!

## 2023-10-17 NOTE — Interval H&P Note (Signed)
The patient has been re-examined, and the chart reviewed, and there have been no interval changes to the documented history and physical.    Plan for L THA for Left FN fx  The operative side was examined and the patient was confirmed to have sensation to DPN, SPN, TN intact, Motor EHL, ext, flex 5/5, and DP 2+, PT 2+, No significant edema.   The risks, benefits, and alternatives have been discussed at length with patient, and the patient is willing to proceed.  Left hip marked. Consent has been signed.

## 2023-10-17 NOTE — Telephone Encounter (Signed)
LVM to schedule appointment with available Provider

## 2023-10-17 NOTE — H&P (Addendum)
[facility] History and Physical  Brian Graham ZOX:[id number] DOB: Mar 31, 1951 DOA: 10/17/2023  Referring physician: ED  PCP: Shelva Majestic, MD   Patient is coming from: Home  Chief Complaint: Left hip pain.  HPI:  Patient is a 72 years old male with past medical history of coronary artery disease status post bypass, CKD, history of nephrectomy, COPD, GERD, hypertension, hyperlipidemia, hypothyroidism, presented hospital after sustaining a fall.  Patient stated that he was dizzy when he got up last night to use the bathroom and he sat back down until it passed then went in but got dizzy again and fell.  Patient did not lose consciousness.  He had immediate left hip pain and could not get up.  Patient stated that he had not been drinking enough water or eating much for few days now.  He also has some hiccups from his advanced kidney disease.  Patient denies any chest pain, increased shortness of breath but does have some COPD and uses inhalers.  Does not use oxygen at home.  At baseline patient is able to ambulate though he does not use any cane he does have a at home.  Patient denies any nausea, vomiting, diarrhea.  Patient denies any urinary urgency, frequency or dysuria.  Denies any sick contacts or recent travel.  Denies any fever, chills or rigor.  Denies any productive sputum.   Subsequently patient was brought into the ED. in the ED vitals stable except for mild tachypnea.  Labs were remarkable for creatinine of 5.0 from previous 3.5, five months ago.  CBC showed hemoglobin of 10.3 with elevated MCV at 101.5.  Lactic was 1.0.  INR 1.1.  CT cervical spine with no fracture.  CT head scan was negative for acute findings. chest x-ray showed evidence of COPD.  Hip x-ray of the left hip showed acute femoral neck fracture.  Orthopedic surgery was consulted and patient was considered for admission to the hospital.  Assessment and Plan  Principal Problem:   Hip fracture  (HCC) Active Problems:   Hx of CABG   HTN (hypertension)   Hyperlipidemia   Hypothyroidism   MGUS (monoclonal gammopathy of unknown significance)   Chronic kidney disease   Left femoral neck fracture status post fall.   Patient was dizzy prior to fall.  No loss of consciousness.  Will continue with IV fluid resuscitation.  Unable to check orthostatics.  Continue hydration.  Orthopedics was consulted from the ED.    Focus on pain management immobilization.  Will utilize a hip fracture order set.  Hold aspirin for now.  Will keep n.p.o. for now just in case patient will undergo surgical intervention today.  Will need PT OT evaluation after surgical intervention for possible rehabilitation.  Dizziness/fall.  Continue IV fluids.  Hold antihypertensives.  Reassess the need for fluid needed in AM.  Acute kidney injury on  CKD stage IV, history of nephrectomy. Continue with IV fluid hydration.  Baseline creatinine around 3.5.  Creatinine today at 5.0.  Continue strict intake and output charting, daily weights.  Might need nephrology evaluation if worsening renal function.  Spoke with the ED physician to check CK levels.  History of severe COPD.  On bronchodilators will continue patient is not on oxygen at home.  History of hypertension history of CAD status post CABG Hold antihypertensives for now.  On atenolol at home.  Continue hydration for today.  Hyperlipidemia Continue Crestor.  Hypothyroidism Continue Synthroid  DVT Prophylaxis: Heparin subcu starting 10/18/2023  Review  of Systems:  All systems were reviewed and were negative unless otherwise mentioned in the HPI   Past Medical History:  Diagnosis Date   Allergy 1980   Anxiety    Arthritis    "left knee" (08/05/2016)   Bruises easily    CAD (coronary artery disease) of artery bypass graft 08/05/2016   Around age 60. CABG - 3 vessel.    Chronic kidney disease    Colon polyp    COPD, 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   Alpha 1  studies normal per prior pulmonary group notes Smoked 40 years, quit in 2012 June 2016 PFT from prior pulmonary group: "significant obstruction" FEV1 1.58L (47% pred), Residual volume 171% pred, DLCO 59% pred Simple Spirometry>> 09/15/2016 ratio 42% FEV1 1.02 L / 31%    Depression 2020   Emphysema of lung (HCC)    Essential tremor 06/11/2016   Plans to see Dr. Arbutus Leas   Former smoker 09/01/2016   Quit 2012. 40 pack years at least.    GERD (gastroesophageal reflux disease)    Headache    History of blood transfusion 08/1997   "when he had his heart surgery"   History of shingles 1970-2013 X 3   HTN (hypertension) 08/05/2016   Amlodipine 5mg , atenolol 50mg  BID, benazepril 20mg    Hyperlipemia    Hypothyroidism    Lumbar disc disease    MGUS (monoclonal gammopathy of unknown significance)    Myocardial infarction (HCC) 08/1997   Peripheral vascular disease (HCC)    PONV (postoperative nausea and vomiting)    Urinary tract infection 09/03/2017   Wears glasses    Past Surgical History:  Procedure Laterality Date   AORTOGRAM Right 08/31/2017   Procedure: AORTOGRAM BILATERAL PELVIC ANGIOGRAM WITH LEFT ILIAC ARTERY STENT;  Surgeon: Fransisco Hertz, MD;  Location: [facility] OR;  Service: Vascular;  Laterality: Right;   BACK SURGERY     CARDIAC CATHETERIZATION  08/1997   COLONOSCOPY  2014   CORONARY ARTERY BYPASS GRAFT  09/12/1997   "triple"   ESOPHAGOGASTRODUODENOSCOPY     INGUINAL HERNIA REPAIR Right 1961   KIDNEY REMOVED     KNEE RECONSTRUCTION Left 1960s - 1974 X 4   LAPAROSCOPIC ABLATION RENAL MASS  ~ 2014   "large abscess/pus ball"   LUMBAR DISC SURGERY  1990s   "they were wedged in the bowel"   PATELLA FRACTURE SURGERY Left 1963   PATELLA FRACTURE SURGERY Left ~ 1965   "removed knee cap"   ROBOT ASSISTED LAPAROSCOPIC NEPHRECTOMY Left 10/07/2017   Procedure: XI ROBOTIC ASSISTED LAPAROSCOPIC NEPHRECTOMY OF PELVIC KIDNEY;  Surgeon: Sebastian Ache, MD;  Location: [facility];  Service: Urology;   Laterality: Left;  Place robot patient tower at foot of bed like prostate per Dr. Berneice Heinrich. Pull extra staple loads.   TONSILLECTOMY      Social History:  reports that he quit smoking about 6 years ago. His smoking use included cigarettes. He started smoking about 51 years ago. He has a 45 pack-year smoking history. He has never used smokeless tobacco. He reports current alcohol use of about 7.0 standard drinks of alcohol per week. He reports that he does not use drugs.  Allergies  Allergen Reactions   Contrast Media [Iodinated Contrast Media] Other (See Comments)    NOT ALLOWED DUE TO KIDNEY ISSUES.   Other Itching and Rash    Gel used for ultrasound SEVERELY broke out the skin    Keflex [Cephalexin] Diarrhea, Nausea Only and Other (See Comments)    Insomnia  Sulfa Antibiotics Other (See Comments)    Insomnia   Ciprofloxacin Diarrhea   Levaquin [Levofloxacin] Other (See Comments)    Insomnia    Omeprazole Diarrhea    Family History  Problem Relation Age of Onset   Alcohol abuse Mother    Alcohol abuse Father        not involved in life   Alcoholism Sister    Healthy Sister    Healthy Sister    Allergic rhinitis Neg Hx    Angioedema Neg Hx    Asthma Neg Hx    Eczema Neg Hx    Immunodeficiency Neg Hx    Urticaria Neg Hx      Prior to Admission medications   Medication Sig Start Date End Date Taking? Authorizing Provider  acetaminophen (TYLENOL) 500 MG tablet Take 500-1,000 mg by mouth every 6 (six) hours as needed (for pain).    Yes [provider]  albuterol (PROVENTIL) (2.5 MG/3ML) 0.083% nebulizer solution INHALE 1 VIAL VIA NEBULIZER EVERY 6 HOURS AS NEEDED FOR WHEEZING OR SHORTNESS OF BREATH 07/06/23  Yes Shelva Majestic, MD  aspirin EC 81 MG tablet Take 81 mg by mouth daily.   Yes [provider]  atenolol (TENORMIN) 50 MG tablet TAKE 1 TABLET DAILY 01/21/23  Yes Shelva Majestic, MD  budesonide-formoterol [facility]) 160-4.5 MCG/ACT inhaler Inhale 2  puffs into the lungs 2 (two) times daily. 11/18/22  Yes Omar Person, MD  desonide (DESOWEN) 0.05 % cream Apply topically 2 (two) times daily. 7-10 days as needed for eczema 04/22/21  Yes Shelva Majestic, MD  levothyroxine (SYNTHROID) 88 MCG tablet Take 1 tablet (88 mcg total) by mouth daily before breakfast. 07/22/23  Yes Shelva Majestic, MD  mirtazapine (REMERON) 15 MG tablet Take 1 tablet (15 mg total) by mouth at bedtime. 09/28/23  Yes Iva Boop, MD  rosuvastatin (CRESTOR) 40 MG tablet TAKE 1 TABLET DAILY (NEED APPOINTMENT) 09/02/23  Yes Shelva Majestic, MD  SPIRIVA RESPIMAT 2.5 MCG/ACT AERS USE 2 INHALATIONS DAILY (NEED APPOINTMENT FOR FURTHER REFILLS) 03/09/23  Yes Omar Person, MD  traMADol (ULTRAM) 50 MG tablet TAKE 1 TABLET (50 MG TOTAL) BY MOUTH 2 (TWO) TIMES DAILY AS NEEDED FOR MODERATE PAIN OR SEVERE PAIN. 03/02/23  Yes Shelva Majestic, MD  albuterol (VENTOLIN HFA) 108 (90 Base) MCG/ACT inhaler USE 2 INHALATIONS EVERY 4 HOURS AS NEEDED 04/19/23   Shelva Majestic, MD    Physical Exam: Vitals:   10/17/23 0849 10/17/23 0855 10/17/23 0900 10/17/23 0915  BP: 121/71  124/63 117/66  Pulse: 79 83 (!) 58 78  Resp: (!) 22 (!) 22 (!) 23 (!) 21  Temp: 99.6 F (37.6 C)     TempSrc: Oral     SpO2: 100% 98% (!) 74% 100%   Wt Readings from Last 3 Encounters:  09/28/23 68.5 kg  05/09/23 68.3 kg  01/24/23 70.3 kg   There is no height or weight on file to calculate BMI.  General:  Average built, not in obvious distress hard of hearing, appears older than stated age HENT: Normocephalic, No scleral pallor or icterus noted. Oral mucosa is moist.  Chest: Diminished breath sounds bilaterally, coarse breath sounds noted.  CVS: S1 &S2 heard. No murmur.  Regular rate and rhythm. Abdomen: Soft, nontender, nondistended.  Bowel sounds are heard. No abdominal mass palpated Extremities: No cyanosis, clubbing or edema.  Peripheral pulses are palpable.  Left hip tenderness secondary  to fracture with shortening of the  hip. Psych: Alert, awake and oriented, normal mood CNS:  No cranial nerve deficits.  Hard of hearing. Skin: Warm and dry.  No rashes noted.  Labs on Admission:   CBC: Recent Labs  Lab 10/17/23 0843 10/17/23 0859  WBC 6.5  --   HGB 10.3* 11.2*  HCT 34.5* 33.0*  MCV 101.5*  --   PLT 217  --     Basic Metabolic Panel: Recent Labs  Lab 10/17/23 0843 10/17/23 0859  NA 137 138  K 4.2 4.2  CL 101 101  CO2 26  --   GLUCOSE 86 84  BUN 36* 40*  CREATININE 5.05* 5.00*  CALCIUM 8.1*  --     Liver Function Tests: Recent Labs  Lab 10/17/23 0843  AST 21  ALT 12  ALKPHOS 63  BILITOT 0.8  PROT 6.9  ALBUMIN 2.6*   No results for input(s): "LIPASE", "AMYLASE" in the last 168 hours. No results for input(s): "AMMONIA" in the last 168 hours.  Cardiac Enzymes: No results for input(s): "CKTOTAL", "CKMB", "CKMBINDEX", "TROPONINI" in the last 168 hours.  BNP (last 3 results) No results for input(s): "BNP" in the last 8760 hours.  ProBNP (last 3 results) No results for input(s): "PROBNP" in the last 8760 hours.  CBG: No results for input(s): "GLUCAP" in the last 168 hours.  Lipase     Component Value Date/Time   LIPASE 28 08/13/2017 0117     Urinalysis    Component Value Date/Time   COLORURINE YELLOW 12/04/2018 2206   APPEARANCEUR HAZY (A) 12/04/2018 2206   APPEARANCEUR Clear 08/23/2016 1141   LABSPEC 1.025 09/19/2019 1203   PHURINE 6.5 09/19/2019 1203   GLUCOSEU NEGATIVE 09/19/2019 1203   GLUCOSEU NEGATIVE 11/28/2018 1212   HGBUR LARGE (A) 09/19/2019 1203   BILIRUBINUR NEGATIVE 09/19/2019 1203   BILIRUBINUR negative 09/03/2017 1155   BILIRUBINUR Negative 08/23/2016 1141   KETONESUR NEGATIVE 09/19/2019 1203   PROTEINUR >=300 (A) 09/19/2019 1203   UROBILINOGEN 0.2 09/19/2019 1203   NITRITE NEGATIVE 09/19/2019 1203   LEUKOCYTESUR MODERATE (A) 09/19/2019 1203     Drugs of Abuse  No results found for: "LABOPIA",  "COCAINSCRNUR", "LABBENZ", "AMPHETMU", "THCU", "LABBARB"    Radiological Exams on Admission: CT Cervical Spine Wo Contrast  Result Date: 10/17/2023 CLINICAL DATA:  Neck trauma (Age >= 65y); Head trauma, moderate-severe EXAM: CT HEAD WITHOUT CONTRAST CT CERVICAL SPINE WITHOUT CONTRAST TECHNIQUE: Multidetector CT imaging of the head and cervical spine was performed following the standard protocol without intravenous contrast. Multiplanar CT image reconstructions of the cervical spine were also generated. RADIATION DOSE REDUCTION: This exam was performed according to the departmental dose-optimization program which includes automated exposure control, adjustment of the mA and/or kV according to patient size and/or use of iterative reconstruction technique. COMPARISON:  None Available. FINDINGS: CT HEAD FINDINGS Brain: No hemorrhage. No hydrocephalus. No extra-axial fluid collection. No CT evidence of an acute cortical infarct. No mass effect. No mass lesion. Vascular: No hyperdense vessel or unexpected calcification. Skull: Normal. Negative for fracture or focal lesion. Sinuses/Orbits: No middle ear or mastoid effusion. Paranasal sinuses are notable for mucosal thickening in the right sphenoid bilateral ethmoid sinuses. Orbits are unremarkable. Other: None. CT CERVICAL SPINE FINDINGS Alignment: Normal. Skull base and vertebrae: No acute fracture. No primary bone lesion or focal pathologic process. Soft tissues and spinal canal: No prevertebral fluid or swelling. No visible canal hematoma. Disc levels: There is likely moderate spinal canal narrowing at C5-C6 Upper chest: Biapical emphysema and pleuroparenchymal scarring. Other:  Severe atherosclerotic calcification of the carotid bifurcation on the right IMPRESSION: 1. No acute intracranial abnormality. 2. No acute fracture or traumatic subluxation of the cervical spine. 3. Severe atherosclerotic calcification of the carotid bifurcation on the right. Electronically  Signed   By: Lorenza Cambridge M.D.   On: 10/17/2023 09:54   CT Head Wo Contrast  Result Date: 10/17/2023 CLINICAL DATA:  Neck trauma (Age >= 65y); Head trauma, moderate-severe EXAM: CT HEAD WITHOUT CONTRAST CT CERVICAL SPINE WITHOUT CONTRAST TECHNIQUE: Multidetector CT imaging of the head and cervical spine was performed following the standard protocol without intravenous contrast. Multiplanar CT image reconstructions of the cervical spine were also generated. RADIATION DOSE REDUCTION: This exam was performed according to the departmental dose-optimization program which includes automated exposure control, adjustment of the mA and/or kV according to patient size and/or use of iterative reconstruction technique. COMPARISON:  None Available. FINDINGS: CT HEAD FINDINGS Brain: No hemorrhage. No hydrocephalus. No extra-axial fluid collection. No CT evidence of an acute cortical infarct. No mass effect. No mass lesion. Vascular: No hyperdense vessel or unexpected calcification. Skull: Normal. Negative for fracture or focal lesion. Sinuses/Orbits: No middle ear or mastoid effusion. Paranasal sinuses are notable for mucosal thickening in the right sphenoid bilateral ethmoid sinuses. Orbits are unremarkable. Other: None. CT CERVICAL SPINE FINDINGS Alignment: Normal. Skull base and vertebrae: No acute fracture. No primary bone lesion or focal pathologic process. Soft tissues and spinal canal: No prevertebral fluid or swelling. No visible canal hematoma. Disc levels: There is likely moderate spinal canal narrowing at C5-C6 Upper chest: Biapical emphysema and pleuroparenchymal scarring. Other: Severe atherosclerotic calcification of the carotid bifurcation on the right IMPRESSION: 1. No acute intracranial abnormality. 2. No acute fracture or traumatic subluxation of the cervical spine. 3. Severe atherosclerotic calcification of the carotid bifurcation on the right. Electronically Signed   By: Lorenza Cambridge M.D.   On:  10/17/2023 09:54   DG Hip Unilat W or Wo Pelvis 2-3 Views Left  Result Date: 10/17/2023 CLINICAL DATA:  72 year old male status post fall with lower extremity foreshortening. EXAM: DG HIP (WITH OR WITHOUT PELVIS) 2-3V LEFT COMPARISON:  CT Abdomen and Pelvis 08/31/2019. FINDINGS: AP portable supine views of the pelvis and left hip at 0841 hours. Left femoral neck fracture with varus impaction. The intertrochanteric segment appears to remain intact on these images. Left femoral head appears to remain normally located. No superimposed pelvis fracture identified. Grossly intact proximal right femur. Nonobstructed bowel gas pattern. Aortoiliac calcified atherosclerosis. Left iliac artery vascular stent is chronic. IMPRESSION: Acute Left femoral neck fracture with varus impaction. No superimposed pelvis fracture identified. Electronically Signed   By: Odessa Fleming M.D.   On: 10/17/2023 09:01   DG Chest Portable 1 View  Result Date: 10/17/2023 CLINICAL DATA:  72 year old male status post fall with left hip fracture. EXAM: PORTABLE CHEST 1 VIEW COMPARISON:  Chest CT 01/19/2023 and earlier. FINDINGS: Portable AP semi upright view at 0838 hours. Chronic large lung volumes. Chronic sternotomy. Mediastinal contours remain within normal limits. Stable lung markings. No pneumothorax, pulmonary edema, acute lung opacity. Stable visualized osseous structures. IMPRESSION: COPD.  No acute cardiopulmonary abnormality. Electronically Signed   By: Odessa Fleming M.D.   On: 10/17/2023 09:00    EKG: Personally reviewed by me which shows normal sinus rhythm   Consultant: Orthopedics  Code Status: Full code.  I spoke with the patient and patient's wife at bedside.  Microbiology none  Antibiotics: None  Family Communication:  Patients' condition and plan  of care including tests being ordered have been discussed with the patient and the patient's wife at bedside who indicate understanding and agree with the plan.   Status is:  Inpatient Remains inpatient appropriate because: Hip fracture, AKI   Severity of Illness: The appropriate patient status for this patient is INPATIENT. Inpatient status is judged to be reasonable and necessary in order to provide the required intensity of service to ensure the patient's safety. The patient's presenting symptoms, physical exam findings, and initial radiographic and laboratory data in the context of their chronic comorbidities is felt to place them at high risk for further clinical deterioration. Furthermore, it is not anticipated that the patient will be medically stable for discharge from the hospital within 2 midnights of admission. I certify that at the point of admission it is my clinical judgment that the patient will require inpatient hospital care spanning beyond 2 midnights from the point of admission due to high intensity of service, high risk for further deterioration and high frequency of surveillance required.  Signed, Joycelyn Das, MD [facility] 10/17/2023

## 2023-10-17 NOTE — ED Triage Notes (Signed)
Pt BIB [facility] for a fall.  PT only takes ASA.  Pt felt dizzy and fell from standing.  Pt did have some nausea.  May have hit head.    PT complains of left posterior hip pain with possible shortening and rotation.   Pt is on 2L at baseline for COPD.  HTN, kidney failure (has 1 kidney), Hyperlipidemia.  VSS.

## 2023-10-17 NOTE — Plan of Care (Signed)
  Problem: Education: Goal: Knowledge of General Education information will improve Description: Including pain rating scale, medication(s)/side effects and non-pharmacologic comfort measures Outcome: Progressing   Problem: Clinical Measurements: Goal: Will remain free from infection Outcome: Progressing   Problem: Activity: Goal: Risk for activity intolerance will decrease Outcome: Progressing   Problem: Nutrition: Goal: Adequate nutrition will be maintained Outcome: Progressing   Problem: Coping: Goal: Level of anxiety will decrease Outcome: Progressing   Problem: Elimination: Goal: Will not experience complications related to bowel motility Outcome: Progressing   Problem: Pain Management: Goal: General experience of comfort will improve Outcome: Progressing   Problem: Skin Integrity: Goal: Risk for impaired skin integrity will decrease Outcome: Progressing

## 2023-10-17 NOTE — Transfer of Care (Signed)
Immediate Anesthesia Transfer of Care Note  Patient: Brian Graham  Procedure(s) Performed: LEFT TOTAL HIP ARTHROPLASTY (Left: Hip)  Patient Location: PACU  Anesthesia Type:General  Level of Consciousness: drowsy and lethargic  Airway & Oxygen Therapy: Patient Spontanous Breathing and Patient connected to face mask oxygen  Post-op Assessment: Report given to RN and Post -op Vital signs reviewed and stable  Post vital signs: Reviewed and stable  Last Vitals:  Vitals Value Taken Time  BP 118/51 10/17/23 1533  Temp 36.8 C 10/17/23 1530  Pulse 66 10/17/23 1544  Resp 12 10/17/23 1544  SpO2 100 % 10/17/23 1544  Vitals shown include unfiled device data.  Last Pain:  Vitals:   10/17/23 1530  TempSrc:   PainSc: Asleep      Patients Stated Pain Goal: 0 (10/17/23 1309)  Complications: No notable events documented.

## 2023-10-17 NOTE — Anesthesia Postprocedure Evaluation (Signed)
Anesthesia Post Note  Patient: Brian Graham  Procedure(s) Performed: LEFT TOTAL HIP ARTHROPLASTY (Left: Hip)     Patient location during evaluation: PACU Anesthesia Type: General Level of consciousness: awake Pain management: pain level controlled Vital Signs Assessment: post-procedure vital signs reviewed and stable Respiratory status: spontaneous breathing, nonlabored ventilation, respiratory function stable and patient connected to nasal cannula oxygen Cardiovascular status: blood pressure returned to baseline and stable Postop Assessment: no apparent nausea or vomiting Anesthetic complications: no   No notable events documented.  Last Vitals:  Vitals:   10/17/23 1630 10/17/23 1645  BP: (!) 137/51 (!) 123/52  Pulse: 66 71  Resp: 14 16  Temp:  36.8 C  SpO2: 100% 100%    Last Pain:  Vitals:   10/17/23 1530  TempSrc:   PainSc: Asleep                 Mariann Barter

## 2023-10-17 NOTE — Anesthesia Preprocedure Evaluation (Addendum)
Anesthesia Evaluation  Patient identified by MRN, date of birth, ID band Patient awake    Reviewed: Allergy & Precautions, NPO status , Patient's Chart, lab work & pertinent test results, reviewed documented beta blocker date and time   History of Anesthesia Complications (+) PONV and history of anesthetic complications  Airway Mallampati: III       Dental  (+) Edentulous Upper, Edentulous Lower   Pulmonary former smoker   Pulmonary exam normal        Cardiovascular hypertension, Pt. on home beta blockers Normal cardiovascular exam     Neuro/Psych  Headaches PSYCHIATRIC DISORDERS Anxiety Depression       GI/Hepatic   Endo/Other  Hypothyroidism    Renal/GU      Musculoskeletal   Abdominal Normal abdominal exam  (+)   Peds  Hematology   Anesthesia Other Findings   Reproductive/Obstetrics                             Anesthesia Physical Anesthesia Plan  ASA: 2  Anesthesia Plan: General   Post-op Pain Management:    Induction: Intravenous  PONV Risk Score and Plan: 4 or greater and Ondansetron  Airway Management Planned: Oral ETT  Additional Equipment: None  Intra-op Plan:   Post-operative Plan: Extubation in OR  Informed Consent: I have reviewed the patients History and Physical, chart, labs and discussed the procedure including the risks, benefits and alternatives for the proposed anesthesia with the patient or authorized representative who has indicated his/her understanding and acceptance.     Dental advisory given  Plan Discussed with: CRNA  Anesthesia Plan Comments:        Anesthesia Quick Evaluation

## 2023-10-17 NOTE — ED Provider Notes (Signed)
[facility] AT [facility] Provider Note   CSN: [id number] Arrival date & time: 10/17/23  4098     History No chief complaint on file.   Brian Graham is a 72 y.o. male with history of essential tremor, CAD, COPD currently on home oxygen, chronic pain of both knees, aortic atherosclerosis, chronic kidney disease status post nephrectomy.  Patient presents to ED for evaluation of fall, level 2 trauma.  Patient was initially paged out as a level 2 trauma as he reported that he was on blood thinning medication but after further clarification with EMS the patient stated he was only on aspirin.  He reports he became lightheaded on standing this morning causing him to fall.  He has shortening and rotation of his left lower extremity and is also complaining of right arm pain.  He he also states he has neck pain.  Denies preceding chest pain or shortness of breath.  States he is currently on home oxygen due to COPD exacerbation as result of recent cold.  He is complaining of pain to his left hip.  He denies any pain throughout his thoracic or lumbar spine.  Denies nausea, vomiting.  HPI     Home Medications Prior to Admission medications   Medication Sig Start Date End Date Taking? Authorizing Provider  acetaminophen (TYLENOL) 500 MG tablet Take 500-1,000 mg by mouth every 6 (six) hours as needed (for pain).    Yes [provider]  albuterol (PROVENTIL) (2.5 MG/3ML) 0.083% nebulizer solution INHALE 1 VIAL VIA NEBULIZER EVERY 6 HOURS AS NEEDED FOR WHEEZING OR SHORTNESS OF BREATH 07/06/23  Yes Shelva Majestic, MD  aspirin EC 81 MG tablet Take 81 mg by mouth daily.   Yes [provider]  atenolol (TENORMIN) 50 MG tablet TAKE 1 TABLET DAILY 01/21/23  Yes Shelva Majestic, MD  budesonide-formoterol [facility]) 160-4.5 MCG/ACT inhaler Inhale 2 puffs into the lungs 2 (two) times daily. 11/18/22  Yes Omar Person, MD  desonide (DESOWEN) 0.05 % cream  Apply topically 2 (two) times daily. 7-10 days as needed for eczema 04/22/21  Yes Shelva Majestic, MD  levothyroxine (SYNTHROID) 88 MCG tablet Take 1 tablet (88 mcg total) by mouth daily before breakfast. 07/22/23  Yes Shelva Majestic, MD  mirtazapine (REMERON) 15 MG tablet Take 1 tablet (15 mg total) by mouth at bedtime. 09/28/23  Yes Iva Boop, MD  rosuvastatin (CRESTOR) 40 MG tablet TAKE 1 TABLET DAILY (NEED APPOINTMENT) 09/02/23  Yes Shelva Majestic, MD  SPIRIVA RESPIMAT 2.5 MCG/ACT AERS USE 2 INHALATIONS DAILY (NEED APPOINTMENT FOR FURTHER REFILLS) 03/09/23  Yes Omar Person, MD  traMADol (ULTRAM) 50 MG tablet TAKE 1 TABLET (50 MG TOTAL) BY MOUTH 2 (TWO) TIMES DAILY AS NEEDED FOR MODERATE PAIN OR SEVERE PAIN. 03/02/23  Yes Shelva Majestic, MD  albuterol (VENTOLIN HFA) 108 (90 Base) MCG/ACT inhaler USE 2 INHALATIONS EVERY 4 HOURS AS NEEDED 04/19/23   Shelva Majestic, MD      Allergies    Contrast media [iodinated contrast media], Other, Keflex [cephalexin], Sulfa antibiotics, Ciprofloxacin, Levaquin [levofloxacin], and Omeprazole    Review of Systems   Review of Systems  Respiratory:  Negative for shortness of breath.   Cardiovascular:  Negative for chest pain.  Musculoskeletal:  Positive for arthralgias and neck pain.  Neurological:  Positive for light-headedness.  All other systems reviewed and are negative.   Physical Exam Updated Vital Signs BP (!) 142/82  Pulse 78   Temp 98.6 F (37 C) (Oral)   Resp 20   Ht 5\' 9"  (1.753 m)   Wt 68 kg   SpO2 100%   BMI 22.14 kg/m  Physical Exam Vitals and nursing note reviewed.  Constitutional:      General: He is not in acute distress.    Appearance: He is well-developed.  HENT:     Head: Normocephalic and atraumatic.  Eyes:     Conjunctiva/sclera: Conjunctivae normal.  Neck:     Comments: Tenderness to cervical spine. Cardiovascular:     Rate and Rhythm: Normal rate and regular rhythm.     Heart sounds: No  murmur heard. Pulmonary:     Effort: Pulmonary effort is normal. No respiratory distress.     Breath sounds: Normal breath sounds.  Abdominal:     Palpations: Abdomen is soft.     Tenderness: There is no abdominal tenderness.  Musculoskeletal:        General: Swelling present.     Cervical back: Neck supple.     Comments: Obvious shortening rotation to left lower extremity.  Tenderness to left hip.  Skin:    General: Skin is warm and dry.     Capillary Refill: Capillary refill takes less than 2 seconds.  Neurological:     Mental Status: He is alert.  Psychiatric:        Mood and Affect: Mood normal.     ED Results / Procedures / Treatments   Labs (all labs ordered are listed, but only abnormal results are displayed) Labs Reviewed  COMPREHENSIVE METABOLIC PANEL - Abnormal; Notable for the following components:      Result Value   BUN 36 (*)    Creatinine, Ser 5.05 (*)    Calcium 8.1 (*)    Albumin 2.6 (*)    GFR, Estimated 11 (*)    All other components within normal limits  CBC - Abnormal; Notable for the following components:   RBC 3.40 (*)    Hemoglobin 10.3 (*)    HCT 34.5 (*)    MCV 101.5 (*)    MCHC 29.9 (*)    RDW 15.7 (*)    All other components within normal limits  I-STAT CHEM 8, ED - Abnormal; Notable for the following components:   BUN 40 (*)    Creatinine, Ser 5.00 (*)    Calcium, Ion 0.99 (*)    Hemoglobin 11.2 (*)    HCT 33.0 (*)    All other components within normal limits  SURGICAL PCR SCREEN  ETHANOL  PROTIME-INR  URINALYSIS, ROUTINE W REFLEX MICROSCOPIC  CK  I-STAT CG4 LACTIC ACID, ED  SAMPLE TO BLOOD BANK  TYPE AND SCREEN    EKG EKG Interpretation Date/Time:  Monday October 17 2023 08:41:46 EST Ventricular Rate:  78 PR Interval:  179 QRS Duration:  111 QT Interval:  390 QTC Calculation: 445 R Axis:   -84  Text Interpretation: Sinus rhythm Left anterior fascicular block Consider right ventricular hypertrophy No significant change  since last tracing Reconfirmed by Elayne Snare ([id number]) on 10/17/2023 9:02:23 AM  Radiology CT Cervical Spine Wo Contrast  Result Date: 10/17/2023 CLINICAL DATA:  Neck trauma (Age >= 65y); Head trauma, moderate-severe EXAM: CT HEAD WITHOUT CONTRAST CT CERVICAL SPINE WITHOUT CONTRAST TECHNIQUE: Multidetector CT imaging of the head and cervical spine was performed following the standard protocol without intravenous contrast. Multiplanar CT image reconstructions of the cervical spine were also generated. RADIATION DOSE REDUCTION: This exam was  performed according to the departmental dose-optimization program which includes automated exposure control, adjustment of the mA and/or kV according to patient size and/or use of iterative reconstruction technique. COMPARISON:  None Available. FINDINGS: CT HEAD FINDINGS Brain: No hemorrhage. No hydrocephalus. No extra-axial fluid collection. No CT evidence of an acute cortical infarct. No mass effect. No mass lesion. Vascular: No hyperdense vessel or unexpected calcification. Skull: Normal. Negative for fracture or focal lesion. Sinuses/Orbits: No middle ear or mastoid effusion. Paranasal sinuses are notable for mucosal thickening in the right sphenoid bilateral ethmoid sinuses. Orbits are unremarkable. Other: None. CT CERVICAL SPINE FINDINGS Alignment: Normal. Skull base and vertebrae: No acute fracture. No primary bone lesion or focal pathologic process. Soft tissues and spinal canal: No prevertebral fluid or swelling. No visible canal hematoma. Disc levels: There is likely moderate spinal canal narrowing at C5-C6 Upper chest: Biapical emphysema and pleuroparenchymal scarring. Other: Severe atherosclerotic calcification of the carotid bifurcation on the right IMPRESSION: 1. No acute intracranial abnormality. 2. No acute fracture or traumatic subluxation of the cervical spine. 3. Severe atherosclerotic calcification of the carotid bifurcation on the right.  Electronically Signed   By: Lorenza Cambridge M.D.   On: 10/17/2023 09:54   CT Head Wo Contrast  Result Date: 10/17/2023 CLINICAL DATA:  Neck trauma (Age >= 65y); Head trauma, moderate-severe EXAM: CT HEAD WITHOUT CONTRAST CT CERVICAL SPINE WITHOUT CONTRAST TECHNIQUE: Multidetector CT imaging of the head and cervical spine was performed following the standard protocol without intravenous contrast. Multiplanar CT image reconstructions of the cervical spine were also generated. RADIATION DOSE REDUCTION: This exam was performed according to the departmental dose-optimization program which includes automated exposure control, adjustment of the mA and/or kV according to patient size and/or use of iterative reconstruction technique. COMPARISON:  None Available. FINDINGS: CT HEAD FINDINGS Brain: No hemorrhage. No hydrocephalus. No extra-axial fluid collection. No CT evidence of an acute cortical infarct. No mass effect. No mass lesion. Vascular: No hyperdense vessel or unexpected calcification. Skull: Normal. Negative for fracture or focal lesion. Sinuses/Orbits: No middle ear or mastoid effusion. Paranasal sinuses are notable for mucosal thickening in the right sphenoid bilateral ethmoid sinuses. Orbits are unremarkable. Other: None. CT CERVICAL SPINE FINDINGS Alignment: Normal. Skull base and vertebrae: No acute fracture. No primary bone lesion or focal pathologic process. Soft tissues and spinal canal: No prevertebral fluid or swelling. No visible canal hematoma. Disc levels: There is likely moderate spinal canal narrowing at C5-C6 Upper chest: Biapical emphysema and pleuroparenchymal scarring. Other: Severe atherosclerotic calcification of the carotid bifurcation on the right IMPRESSION: 1. No acute intracranial abnormality. 2. No acute fracture or traumatic subluxation of the cervical spine. 3. Severe atherosclerotic calcification of the carotid bifurcation on the right. Electronically Signed   By: Lorenza Cambridge M.D.    On: 10/17/2023 09:54   DG Hip Unilat W or Wo Pelvis 2-3 Views Left  Result Date: 10/17/2023 CLINICAL DATA:  72 year old male status post fall with lower extremity foreshortening. EXAM: DG HIP (WITH OR WITHOUT PELVIS) 2-3V LEFT COMPARISON:  CT Abdomen and Pelvis 08/31/2019. FINDINGS: AP portable supine views of the pelvis and left hip at 0841 hours. Left femoral neck fracture with varus impaction. The intertrochanteric segment appears to remain intact on these images. Left femoral head appears to remain normally located. No superimposed pelvis fracture identified. Grossly intact proximal right femur. Nonobstructed bowel gas pattern. Aortoiliac calcified atherosclerosis. Left iliac artery vascular stent is chronic. IMPRESSION: Acute Left femoral neck fracture with varus impaction. No superimposed pelvis  fracture identified. Electronically Signed   By: Odessa Fleming M.D.   On: 10/17/2023 09:01   DG Chest Portable 1 View  Result Date: 10/17/2023 CLINICAL DATA:  72 year old male status post fall with left hip fracture. EXAM: PORTABLE CHEST 1 VIEW COMPARISON:  Chest CT 01/19/2023 and earlier. FINDINGS: Portable AP semi upright view at 0838 hours. Chronic large lung volumes. Chronic sternotomy. Mediastinal contours remain within normal limits. Stable lung markings. No pneumothorax, pulmonary edema, acute lung opacity. Stable visualized osseous structures. IMPRESSION: COPD.  No acute cardiopulmonary abnormality. Electronically Signed   By: Odessa Fleming M.D.   On: 10/17/2023 09:00    Procedures Procedures   Medications Ordered in ED Medications  chlorhexidine (HIBICLENS) 4 % liquid 4 Application (has no administration in time range)  povidone-iodine 10 % swab 2 Application (2 Applications Topical Not Given 10/17/23 1311)  gentamicin (GARAMYCIN) 340 mg in dextrose 5 % 100 mL IVPB (has no administration in time range)  vancomycin (VANCOCIN) IVPB 1000 mg/200 mL premix (1,000 mg Intravenous New Bag/Given 10/17/23  1316)  tranexamic acid (CYKLOKAPRON) IVPB 1,000 mg (has no administration in time range)  0.9 %  sodium chloride infusion (has no administration in time range)  0.9 %  sodium chloride infusion (has no administration in time range)  tranexamic acid (CYKLOKAPRON) 1000MG /119mL IVPB (has no administration in time range)  fentaNYL (SUBLIMAZE) 100 MCG/2ML injection (has no administration in time range)  morphine (PF) 4 MG/ML injection 4 mg (4 mg Intravenous Given 10/17/23 0843)  ondansetron (ZOFRAN) injection 4 mg (4 mg Intravenous Given 10/17/23 0843)  sodium chloride 0.9 % bolus 500 mL (0 mLs Intravenous Stopped 10/17/23 1055)  fentaNYL (SUBLIMAZE) injection 50 mcg (50 mcg Intravenous Given 10/17/23 0930)  cyclobenzaprine (FLEXERIL) tablet 10 mg (10 mg Oral Given 10/17/23 1103)  HYDROmorphone (DILAUDID) injection 0.5 mg (0.5 mg Intravenous Given 10/17/23 1103)  chlorhexidine (PERIDEX) 0.12 % solution 15 mL (15 mLs Mouth/Throat Given 10/17/23 1315)    Or  Oral care mouth rinse ( Mouth Rinse See Alternative 10/17/23 1315)  fentaNYL (SUBLIMAZE) injection 50 mcg (50 mcg Intravenous Given 10/17/23 1326)    ED Course/ Medical Decision Making/ A&P   Medical Decision Making Amount and/or Complexity of Data Reviewed Labs: ordered. Radiology: ordered.  Risk Prescription drug management.   72 year old male presents for evaluation of fall, shortening rotation of left lower extremity.  Please see HPI for further details.  Patient came in as level 2 trauma.  Patient reported to EMS he took blood thinners but on further questioning, patient confirms he only takes aspirin.  Patient is afebrile and nontachycardic on arrival.  Lung sounds are clear bilaterally, he is not hypoxic.  Abdomen soft and compressible throughout.  Neurological examination is at baseline.  He has tenderness with cervical spine, placed in c-collar.  He has shortening or rotation of his left lower extremity.  Patient given his level  trauma.  X-rays obtained.  X-ray shows an acute left femoral neck fracture with varus impaction.  CT head, CT cervical spine, chest x-ray unremarkable.  Patient initially provided with formal grams of morphine for pain control.  Patient requesting more pain control so 50 mcg fentanyl administered.  4 mg of Zofran for nausea.  Pain persisted 2.5 mg Dilaudid administered as well as 10 mg p.o. Flexeril.  Patient lab work reveals no lactic acid elevation.  CBC without leukocytosis or anemia.  Metabolic panel shows elevated creatinine indicating AKI, provided 500 mL of fluid.  At this time, discussed with  PA-C Dale Colver orthopedic surgery who advises to admit patient to hospitalist service for probable surgical management of femoral neck fracture in the morning.  Patient admitted to Dr. Tyson Babinski.  He has requested to add on a creatinine kinase.  This has been done.   Final Clinical Impression(s) / ED Diagnoses Final diagnoses:  Closed fracture of neck of left femur, initial encounter [facility])  Fall, initial encounter    Rx / DC Orders ED Discharge Orders     None         Al Decant, PA-C 10/17/23 1407    Elayne Snare K, DO 10/17/23 [phone]

## 2023-10-17 NOTE — H&P (View-Only) (Signed)
Reason for Consult:Left hip fx Referring Physician: Elayne Snare Time called: [id number] Time at bedside: [name] is an 73 y.o. male.  HPI: Brian Graham got up last night to use the bathroom but got dizzy. He sat back down until it passed then went in but got dizzy again and fell. He had immediate left hip pain and could not get up. He was brought to the ED where x-rays showed a left hip fx and orthopedic surgery was consulted. He lives at home with his wife and does not use any assistive devices to ambulate.  Past Medical History:  Diagnosis Date   Allergy 1980   Anxiety    Arthritis    "left knee" (08/05/2016)   Bruises easily    CAD (coronary artery disease) of artery bypass graft 08/05/2016   Around age 25. CABG - 3 vessel.    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 Arbutus Leas   Former smoker 09/01/2016   Quit 2012. 40 pack years at least.    GERD (gastroesophageal reflux disease)    Headache    History of blood transfusion 08/1997   "when he had his heart surgery"   History of shingles 1970-2013 X 3   HTN (hypertension) 08/05/2016   Amlodipine 5mg , atenolol 50mg  BID, benazepril 20mg    Hyperlipemia    Hypothyroidism    Lumbar disc disease    MGUS (monoclonal gammopathy of unknown significance)    Myocardial infarction (HCC) 08/1997   Peripheral vascular disease (HCC)    PONV (postoperative nausea and vomiting)    Urinary tract infection 09/03/2017   Wears glasses     Past Surgical History:  Procedure Laterality Date   AORTOGRAM Right 08/31/2017   Procedure:  AORTOGRAM BILATERAL PELVIC ANGIOGRAM WITH LEFT ILIAC ARTERY STENT;  Surgeon: Fransisco Hertz, MD;  Location: [facility] OR;  Service: Vascular;  Laterality: Right;   BACK SURGERY     CARDIAC CATHETERIZATION  08/1997   COLONOSCOPY  2014   CORONARY ARTERY BYPASS GRAFT  09/12/1997   "triple"   ESOPHAGOGASTRODUODENOSCOPY     INGUINAL HERNIA REPAIR Right 1961   KIDNEY REMOVED     KNEE RECONSTRUCTION Left 1960s - 1974 X 4   LAPAROSCOPIC ABLATION RENAL MASS  ~ 2014   "large abscess/pus ball"   LUMBAR DISC SURGERY  1990s   "they were wedged in the bowel"   PATELLA FRACTURE SURGERY Left 1963   PATELLA FRACTURE SURGERY Left ~ 1965   "removed knee cap"   ROBOT ASSISTED LAPAROSCOPIC NEPHRECTOMY Left 10/07/2017   Procedure: XI ROBOTIC ASSISTED LAPAROSCOPIC NEPHRECTOMY OF PELVIC KIDNEY;  Surgeon: Sebastian Ache, MD;  Location: [facility];  Service: Urology;  Laterality: Left;  Place robot patient tower at foot of bed like prostate per Dr. Berneice Heinrich. Pull extra staple loads.   TONSILLECTOMY      Family History  Problem Relation Age of Onset   Alcohol abuse Mother    Alcohol abuse Father        not involved in life   Alcoholism Sister  Healthy Sister    Healthy Sister    Allergic rhinitis Neg Hx    Angioedema Neg Hx    Asthma Neg Hx    Eczema Neg Hx    Immunodeficiency Neg Hx    Urticaria Neg Hx     Social History:  reports that he quit smoking about 6 years ago. His smoking use included cigarettes. He started smoking about 51 years ago. He has a 45 pack-year smoking history. He has never used smokeless tobacco. He reports current alcohol use of about 7.0 standard drinks of alcohol per week. He reports that he does not use drugs.  Allergies:  Allergies  Allergen Reactions   Contrast Media [Iodinated Contrast Media] Other (See Comments)    NOT ALLOWED DUE TO KIDNEY ISSUES.   Other Itching and Rash    Gel used for ultrasound SEVERELY broke out the skin    Keflex [Cephalexin] Diarrhea, Nausea Only and  Other (See Comments)    Insomnia   Sulfa Antibiotics Other (See Comments)    Insomnia   Ciprofloxacin Diarrhea   Levaquin [Levofloxacin] Other (See Comments)    Insomnia    Omeprazole Diarrhea    Medications: I have reviewed the patient's current medications.  Results for orders placed or performed during the hospital encounter of 10/17/23 (from the past 48 hour(s))  Comprehensive metabolic panel     Status: Abnormal   Collection Time: 10/17/23  8:43 AM  Result Value Ref Range   Sodium 137 135 - 145 mmol/L   Potassium 4.2 3.5 - 5.1 mmol/L   Chloride 101 98 - 111 mmol/L   CO2 26 22 - 32 mmol/L   Glucose, Bld 86 70 - 99 mg/dL    Comment: Glucose reference range applies only to samples taken after fasting for at least 8 hours.   BUN 36 (H) 8 - 23 mg/dL   Creatinine, Ser 8.65 (H) 0.61 - 1.24 mg/dL   Calcium 8.1 (L) 8.9 - 10.3 mg/dL   Total Protein 6.9 6.5 - 8.1 g/dL   Albumin 2.6 (L) 3.5 - 5.0 g/dL   AST 21 15 - 41 U/L   ALT 12 0 - 44 U/L   Alkaline Phosphatase 63 38 - 126 U/L   Total Bilirubin 0.8 <1.2 mg/dL   GFR, Estimated 11 (L) >60 mL/min    Comment: (NOTE) Calculated using the CKD-EPI Creatinine Equation (2021)    Anion gap 10 5 - 15    Comment: Performed at [facility], [address], [place], [place] [address]  CBC     Status: Abnormal   Collection Time: 10/17/23  8:43 AM  Result Value Ref Range   WBC 6.5 4.0 - 10.5 K/uL   RBC 3.40 (L) 4.22 - 5.81 MIL/uL   Hemoglobin 10.3 (L) 13.0 - 17.0 g/dL   HCT 62.9 (L) 52.8 - 41.3 %   MCV 101.5 (H) 80.0 - 100.0 fL   MCH 30.3 26.0 - 34.0 pg   MCHC 29.9 (L) 30.0 - 36.0 g/dL   RDW 24.4 (H) 01.0 - 27.2 %   Platelets 217 150 - 400 K/uL   nRBC 0.0 0.0 - 0.2 %    Comment: Performed at [facility], [address], [place], [place] [address]  Protime-INR     Status: None   Collection Time: 10/17/23  8:43 AM  Result Value Ref Range   Prothrombin Time 14.0 11.4 - 15.2 seconds   INR 1.1 0.8 - 1.2    Comment:  (  NOTE) INR goal varies based on device and disease states. Performed at [facility], [address], [place], [place] [address]   Ethanol     Status: None   Collection Time: 10/17/23  8:48 AM  Result Value Ref Range   Alcohol, Ethyl (B) <10 <10 mg/dL    Comment: (NOTE) Lowest detectable limit for serum alcohol is 10 mg/dL.  For medical purposes only. Performed at [facility], [address] [place], [place] [address]   Sample to Blood Bank     Status: None   Collection Time: 10/17/23  8:48 AM  Result Value Ref Range   Blood Bank Specimen SAMPLE AVAILABLE FOR TESTING    Sample Expiration      10/20/2023,2359 Performed at [facility], [address], [place], [place] [address]   I-Stat Chem 8, ED     Status: Abnormal   Collection Time: 10/17/23  8:59 AM  Result Value Ref Range   Sodium 138 135 - 145 mmol/L   Potassium 4.2 3.5 - 5.1 mmol/L   Chloride 101 98 - 111 mmol/L   BUN 40 (H) 8 - 23 mg/dL   Creatinine, Ser 9.14 (H) 0.61 - 1.24 mg/dL   Glucose, Bld 84 70 - 99 mg/dL    Comment: Glucose reference range applies only to samples taken after fasting for at least 8 hours.   Calcium, Ion 0.99 (L) 1.15 - 1.40 mmol/L   TCO2 26 22 - 32 mmol/L   Hemoglobin 11.2 (L) 13.0 - 17.0 g/dL   HCT 78.2 (L) 95.6 - 21.3 %  I-Stat Lactic Acid, ED     Status: None   Collection Time: 10/17/23  9:00 AM  Result Value Ref Range   Lactic Acid, Venous 1.0 0.5 - 1.9 mmol/L    CT Cervical Spine Wo Contrast  Result Date: 10/17/2023 CLINICAL DATA:  Neck trauma (Age >= 65y); Head trauma, moderate-severe EXAM: CT HEAD WITHOUT CONTRAST CT CERVICAL SPINE WITHOUT CONTRAST TECHNIQUE: Multidetector CT imaging of the head and cervical spine was performed following the standard protocol without intravenous contrast. Multiplanar CT image reconstructions of the cervical spine were also generated. RADIATION DOSE REDUCTION: This exam was performed according to the departmental  dose-optimization program which includes automated exposure control, adjustment of the mA and/or kV according to patient size and/or use of iterative reconstruction technique. COMPARISON:  None Available. FINDINGS: CT HEAD FINDINGS Brain: No hemorrhage. No hydrocephalus. No extra-axial fluid collection. No CT evidence of an acute cortical infarct. No mass effect. No mass lesion. Vascular: No hyperdense vessel or unexpected calcification. Skull: Normal. Negative for fracture or focal lesion. Sinuses/Orbits: No middle ear or mastoid effusion. Paranasal sinuses are notable for mucosal thickening in the right sphenoid bilateral ethmoid sinuses. Orbits are unremarkable. Other: None. CT CERVICAL SPINE FINDINGS Alignment: Normal. Skull base and vertebrae: No acute fracture. No primary bone lesion or focal pathologic process. Soft tissues and spinal canal: No prevertebral fluid or swelling. No visible canal hematoma. Disc levels: There is likely moderate spinal canal narrowing at C5-C6 Upper chest: Biapical emphysema and pleuroparenchymal scarring. Other: Severe atherosclerotic calcification of the carotid bifurcation on the right IMPRESSION: 1. No acute intracranial abnormality. 2. No acute fracture or traumatic subluxation of the cervical spine. 3. Severe atherosclerotic calcification of the carotid bifurcation on the right. Electronically Signed   By: Lorenza Cambridge M.D.   On: 10/17/2023 09:54   CT Head Wo Contrast  Result Date: 10/17/2023 CLINICAL DATA:  Neck trauma (  Age >= 65y); Head trauma, moderate-severe EXAM: CT HEAD WITHOUT CONTRAST CT CERVICAL SPINE WITHOUT CONTRAST TECHNIQUE: Multidetector CT imaging of the head and cervical spine was performed following the standard protocol without intravenous contrast. Multiplanar CT image reconstructions of the cervical spine were also generated. RADIATION DOSE REDUCTION: This exam was performed according to the departmental dose-optimization program which includes  automated exposure control, adjustment of the mA and/or kV according to patient size and/or use of iterative reconstruction technique. COMPARISON:  None Available. FINDINGS: CT HEAD FINDINGS Brain: No hemorrhage. No hydrocephalus. No extra-axial fluid collection. No CT evidence of an acute cortical infarct. No mass effect. No mass lesion. Vascular: No hyperdense vessel or unexpected calcification. Skull: Normal. Negative for fracture or focal lesion. Sinuses/Orbits: No middle ear or mastoid effusion. Paranasal sinuses are notable for mucosal thickening in the right sphenoid bilateral ethmoid sinuses. Orbits are unremarkable. Other: None. CT CERVICAL SPINE FINDINGS Alignment: Normal. Skull base and vertebrae: No acute fracture. No primary bone lesion or focal pathologic process. Soft tissues and spinal canal: No prevertebral fluid or swelling. No visible canal hematoma. Disc levels: There is likely moderate spinal canal narrowing at C5-C6 Upper chest: Biapical emphysema and pleuroparenchymal scarring. Other: Severe atherosclerotic calcification of the carotid bifurcation on the right IMPRESSION: 1. No acute intracranial abnormality. 2. No acute fracture or traumatic subluxation of the cervical spine. 3. Severe atherosclerotic calcification of the carotid bifurcation on the right. Electronically Signed   By: Lorenza Cambridge M.D.   On: 10/17/2023 09:54   DG Hip Unilat W or Wo Pelvis 2-3 Views Left  Result Date: 10/17/2023 CLINICAL DATA:  72 year old male status post fall with lower extremity foreshortening. EXAM: DG HIP (WITH OR WITHOUT PELVIS) 2-3V LEFT COMPARISON:  CT Abdomen and Pelvis 08/31/2019. FINDINGS: AP portable supine views of the pelvis and left hip at 0841 hours. Left femoral neck fracture with varus impaction. The intertrochanteric segment appears to remain intact on these images. Left femoral head appears to remain normally located. No superimposed pelvis fracture identified. Grossly intact proximal  right femur. Nonobstructed bowel gas pattern. Aortoiliac calcified atherosclerosis. Left iliac artery vascular stent is chronic. IMPRESSION: Acute Left femoral neck fracture with varus impaction. No superimposed pelvis fracture identified. Electronically Signed   By: Odessa Fleming M.D.   On: 10/17/2023 09:01   DG Chest Portable 1 View  Result Date: 10/17/2023 CLINICAL DATA:  72 year old male status post fall with left hip fracture. EXAM: PORTABLE CHEST 1 VIEW COMPARISON:  Chest CT 01/19/2023 and earlier. FINDINGS: Portable AP semi upright view at 0838 hours. Chronic large lung volumes. Chronic sternotomy. Mediastinal contours remain within normal limits. Graham lung markings. No pneumothorax, pulmonary edema, acute lung opacity. Graham visualized osseous structures. IMPRESSION: COPD.  No acute cardiopulmonary abnormality. Electronically Signed   By: Odessa Fleming M.D.   On: 10/17/2023 09:00    Review of Systems  HENT:  Negative for ear discharge, ear pain, hearing loss and tinnitus.   Eyes:  Negative for photophobia and pain.  Respiratory:  Negative for cough and shortness of breath.   Cardiovascular:  Negative for chest pain.  Gastrointestinal:  Negative for abdominal pain, nausea and vomiting.  Genitourinary:  Negative for dysuria, flank pain, frequency and urgency.  Musculoskeletal:  Positive for arthralgias (Left hip). Negative for back pain, myalgias and neck pain.  Neurological:  Negative for dizziness and headaches.  Hematological:  Does not bruise/bleed easily.  Psychiatric/Behavioral:  The patient is not nervous/anxious.    Blood pressure 117/66, pulse 78,  temperature 99.6 F (37.6 C), temperature source Oral, resp. rate (!) 21, SpO2 100%. Physical Exam Constitutional:      General: He is not in acute distress.    Appearance: He is well-developed. He is not diaphoretic.  HENT:     Head: Normocephalic and atraumatic.  Eyes:     General: No scleral icterus.       Right eye: No discharge.         Left eye: No discharge.     Conjunctiva/sclera: Conjunctivae normal.  Cardiovascular:     Rate and Rhythm: Normal rate and regular rhythm.  Pulmonary:     Effort: Pulmonary effort is normal. No respiratory distress.  Musculoskeletal:     Cervical back: Normal range of motion.     Comments: LLE No traumatic wounds, ecchymosis, or rash  Severe TTP hip  No knee or ankle effusion  Knee Graham to varus/ valgus and anterior/posterior stress  Sens DPN, SPN, TN intact  Motor EHL, ext, flex, evers 5/5  DP 2+, PT 2+, No significant edema  Skin:    General: Skin is warm and dry.  Neurological:     Mental Status: He is alert.  Psychiatric:        Mood and Affect: Mood normal.        Behavior: Behavior normal.     Assessment/Plan: Left hip fx -- Plan THA, hopefully today with Dr. Blanchie Dessert. Please keep NPO.    Freeman Caldron, PA-C Orthopedic Surgery [phone] 10/17/2023, 10:47 AM

## 2023-10-17 NOTE — Progress Notes (Signed)
Orthopedic Tech Progress Note Patient Details:  Brian Graham 1950-12-29 [id number] Checked in for Level 2 Trauma.  Patient ID: [name], male   DOB: [birth year], 72 y.o.   MRN: [id number]  Blase Mess 10/17/2023, 9:58 AM

## 2023-10-17 NOTE — Op Note (Signed)
10/17/2023  3:01 PM  PATIENT:  Brian Graham   MRN: [id number]  PRE-OPERATIVE DIAGNOSIS: Left displaced femoral neck fracture  POST-OPERATIVE DIAGNOSIS:  same  PROCEDURE:  LEFT TOTAL HIP ARTHROPLASTY  PREOPERATIVE INDICATIONS:    Brian Graham is an 72 y.o. male who has a diagnosis of left displaced femoral neck fracture and elected for surgical management .  The risks benefits and alternatives were discussed with the patient including but not limited to the risks of nonoperative treatment, versus surgical intervention including infection, bleeding, nerve injury, periprosthetic fracture, the need for revision surgery, dislocation, leg length discrepancy, blood clots, cardiopulmonary complications, morbidity, mortality, among others, and they were willing to proceed.     OPERATIVE REPORT     SURGEON:  Weber Cooks, MD    ASSISTANT: Darron Doom, RNFA, (Present throughout the entire procedure,  necessary for completion of procedure in a timely manner, assisting with retraction, instrumentation, and closure)     ANESTHESIA: General  ESTIMATED BLOOD LOSS: 250cc    COMPLICATIONS:  None.     COMPONENTS:   Press-fit Trident 252 mm acetabular shell, 6.5 hex screws x 2, Accolade 2 size #8 stem with 127 degree neck angle, 36-2.5 ceramic head Implant Name Type Inv. Item Serial No. Manufacturer Lot No. LRB No. Used Action  SHELL CLUSTERHOLE ACETABULAR 5 - [id number] Shell SHELL CLUSTERHOLE ACETABULAR 5  [employer] [id number] A Left 1 Implanted  INSERT TRIDENT POLY 36 0DEG - [id number] Insert INSERT TRIDENT POLY 36 0DEG  [employer] [id number] Left 1 Implanted  SCREW HEX LP 6.5X25 - [id number] Screw SCREW HEX LP 6.5X25  [employer] [id number] Left 1 Implanted  SCREW HEX LP 6.5X35 - [id number] Screw SCREW HEX LP 6.5X35  [employer] [id number] Left 1 Implanted  STEM ACCOLADE II SZ8 - [id number] Stem STEM ACCOLADE II SZ8  [employer] [id number] A Left 1  Implanted  HIP BIOLOX HD 36/-2.5 - [id number] Joint HIP BIOLOX HD 36/-2.5  [employer] [id number] Left 1 Implanted      PROCEDURE IN DETAIL:   The patient was met in the holding area and  identified.  The appropriate hip was identified and marked at the operative site.  The patient was then transported to the OR  and  placed under anesthesia.  At that point, the patient was  placed in the lateral decubitus position with the operative side up and  secured to the operating room table  and all bony prominences padded. A subaxillary role was also placed.    The operative lower extremity was prepped from the iliac crest to the distal leg.  Sterile draping was performed.  Preoperative antibiotics, 1 gm of vanc and gentamycin,1 gm of Tranexamic Acid. Time out was performed prior to incision.      A routine posterolateral approach was utilized via sharp dissection  carried down to the subcutaneous tissue.  Gross bleeders were Bovie coagulated.  The iliotibial band was identified and incised along the length of the skin incision through the glute max fascia.  Charnley retractor was placed with care to protect the sciatic nerve posteriorly.  With the hip internally rotated, the piriformis tendon was identified and released from the femoral insertion and tagged with a #2Ethibond.  A capsulotomy was then performed off the femoral insertion and also tagged with a #2 Ethibond.    The femoral neck was exposed, and I resected the femoral neck just below the femoral neck angle about 15 mm above the lesser trochanter.  The femoral head was  removed with a corkscrew.    I then exposed the deep acetabulum, cleared out any tissue including the ligamentum teres.  After adequate visualization, I excised the labrum.  I then started reaming with a 50 mm reamer, first medializing to the floor of the cotyloid fossa, and then in the position of the cup aiming towards the greater sciatic notch, matching the version of the  transverse acetabular ligament and tucked under the anterior wall. I reamed up to 52 mm reamer with good bony bed preparation and a 52 mm cup was chosen.  The real cup was then impacted into place.  Appropriate version and inclination was confirmed clinically matching their bony anatomy, and also with the use of the jig.  I placed 2 screws in the posterior superior quadrant to augment fixation.  A neutral liner was placed and impacted. It was confirmed to be appropriately seated and the acetabular retractors were removed.    I then prepared the proximal femur using the box cutter, Charnley awl, and then sequentially broached starting with 0 up to a size 8.  A trial broach, neck, and head was utilized, and I reduced the hip and it was found to have excellent stability.  There was no impingement with full extension and 90 degrees external rotation.  The hip was stable at the position of sleep and with 90 degrees flexion and 90 degrees of internal rotation.  Leg lengths were also clinically assessed in the lateral position and felt to be equal. Intra-Op flatplate was obtained and confirmed appropriate component positions.  Good fill of the femur with the size 8  broach.  And restoration of leg length and offset. No evidence or concern for fracture.  A final femoral prosthesis size 8 was selected. I then impacted the real femoral prosthesis into place.I again trialed and selected a 36 -2.33mm ball. The hip was then reduced and taken through a range of motion. There was no impingement with full extension and 90 degrees external rotation.  The hip was stable at the position of sleep and with 90 degrees flexion and 90degrees of internal rotation. Leg lengths were  again assessed and felt to be restored.  We then opened, and I impacted the real head ball into place.  The posterior capsule was then closed with #5 Ethibond.  The piriformis was repaired through the base of the abductor tendon using a [place] suture  passer.  I then irrigated the hip copiously with dilute Betadine and with normal saline pulse lavage. Periarticular injection was then performed with Exparel.   We repaired the fascia #1 barbed suture, followed by 0 barbed suture for the subcutaneous fat.  Skin was closed with 2-0 Vicryl and 3-0 Monocryl.  Dermabond and Aquacel dressing were applied. The patient was then awakened and returned to PACU in stable and satisfactory condition.  Leg lengths in the supine position were assessed and felt to be clinically equal. There were no complications.  Post op recs: WB: WBAT LLE, No formal hip precautions Abx: Vanco and gentamicin Imaging: PACU pelvis Xray Dressing: Aquacell, keep intact until follow up DVT prophylaxis: Lovenox 40 mg daily x 4 weeks starting POD1 Follow up: 2 weeks after surgery for a wound check with Dr. Blanchie Dessert at [facility].  Address: [address], [place], [place] [address]  Office Phone: [phone]   Weber Cooks, MD Orthopedic Surgeon

## 2023-10-17 NOTE — Consult Note (Signed)
Reason for Consult:Left hip fx Referring Physician: Elayne Snare Time called: 1610 Time at bedside: 8546 Brian Graham is an 72 y.o. male.  HPI: Brian Graham got up last night to use the bathroom but got dizzy. He sat back down until it passed then went in but got dizzy again and fell. He had immediate left hip pain and could not get up. He was brought to the ED where x-rays showed a left hip fx and orthopedic surgery was consulted. He lives at home with his wife and does not use any assistive devices to ambulate.  Past Medical History:  Diagnosis Date   Allergy 1980   Anxiety    Arthritis    "left knee" (08/05/2016)   Bruises easily    CAD (coronary artery disease) of artery bypass graft 08/05/2016   Around age 59. CABG - 3 vessel.    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 Arbutus Leas   Former smoker 09/01/2016   Quit 2012. 40 pack years at least.    GERD (gastroesophageal reflux disease)    Headache    History of blood transfusion 08/1997   "when he had his heart surgery"   History of shingles 1970-2013 X 3   HTN (hypertension) 08/05/2016   Amlodipine 5mg , atenolol 50mg  BID, benazepril 20mg    Hyperlipemia    Hypothyroidism    Lumbar disc disease    MGUS (monoclonal gammopathy of unknown significance)    Myocardial infarction (HCC) 08/1997   Peripheral vascular disease (HCC)    PONV (postoperative nausea and vomiting)    Urinary tract infection 09/03/2017   Wears glasses     Past Surgical History:  Procedure Laterality Date   AORTOGRAM Right 08/31/2017   Procedure:  AORTOGRAM BILATERAL PELVIC ANGIOGRAM WITH LEFT ILIAC ARTERY STENT;  Surgeon: Fransisco Hertz, MD;  Location: [facility] OR;  Service: Vascular;  Laterality: Right;   BACK SURGERY     CARDIAC CATHETERIZATION  08/1997   COLONOSCOPY  2014   CORONARY ARTERY BYPASS GRAFT  09/12/1997   "triple"   ESOPHAGOGASTRODUODENOSCOPY     INGUINAL HERNIA REPAIR Right 1961   KIDNEY REMOVED     KNEE RECONSTRUCTION Left 1960s - 1974 X 4   LAPAROSCOPIC ABLATION RENAL MASS  ~ 2014   "large abscess/pus ball"   LUMBAR DISC SURGERY  1990s   "they were wedged in the bowel"   PATELLA FRACTURE SURGERY Left 1963   PATELLA FRACTURE SURGERY Left ~ 1965   "removed knee cap"   ROBOT ASSISTED LAPAROSCOPIC NEPHRECTOMY Left 10/07/2017   Procedure: XI ROBOTIC ASSISTED LAPAROSCOPIC NEPHRECTOMY OF PELVIC KIDNEY;  Surgeon: Sebastian Ache, MD;  Location: [facility];  Service: Urology;  Laterality: Left;  Place robot patient tower at foot of bed like prostate per Dr. Berneice Heinrich. Pull extra staple loads.   TONSILLECTOMY      Family History  Problem Relation Age of Onset   Alcohol abuse Mother    Alcohol abuse Father        not involved in life   Alcoholism Sister  Healthy Sister    Healthy Sister    Allergic rhinitis Neg Hx    Angioedema Neg Hx    Asthma Neg Hx    Eczema Neg Hx    Immunodeficiency Neg Hx    Urticaria Neg Hx     Social History:  reports that he quit smoking about 6 years ago. His smoking use included cigarettes. He started smoking about 51 years ago. He has a 45 pack-year smoking history. He has never used smokeless tobacco. He reports current alcohol use of about 7.0 standard drinks of alcohol per week. He reports that he does not use drugs.  Allergies:  Allergies  Allergen Reactions   Contrast Media [Iodinated Contrast Media] Other (See Comments)    NOT ALLOWED DUE TO KIDNEY ISSUES.   Other Itching and Rash    Gel used for ultrasound SEVERELY broke out the skin    Keflex [Cephalexin] Diarrhea, Nausea Only and  Other (See Comments)    Insomnia   Sulfa Antibiotics Other (See Comments)    Insomnia   Ciprofloxacin Diarrhea   Levaquin [Levofloxacin] Other (See Comments)    Insomnia    Omeprazole Diarrhea    Medications: I have reviewed the patient's current medications.  Results for orders placed or performed during the hospital encounter of 10/17/23 (from the past 48 hour(s))  Comprehensive metabolic panel     Status: Abnormal   Collection Time: 10/17/23  8:43 AM  Result Value Ref Range   Sodium 137 135 - 145 mmol/L   Potassium 4.2 3.5 - 5.1 mmol/L   Chloride 101 98 - 111 mmol/L   CO2 26 22 - 32 mmol/L   Glucose, Bld 86 70 - 99 mg/dL    Comment: Glucose reference range applies only to samples taken after fasting for at least 8 hours.   BUN 36 (H) 8 - 23 mg/dL   Creatinine, Ser 9.62 (H) 0.61 - 1.24 mg/dL   Calcium 8.1 (L) 8.9 - 10.3 mg/dL   Total Protein 6.9 6.5 - 8.1 g/dL   Albumin 2.6 (L) 3.5 - 5.0 g/dL   AST 21 15 - 41 U/L   ALT 12 0 - 44 U/L   Alkaline Phosphatase 63 38 - 126 U/L   Total Bilirubin 0.8 <1.2 mg/dL   GFR, Estimated 11 (L) >60 mL/min    Comment: (NOTE) Calculated using the CKD-EPI Creatinine Equation (2021)    Anion gap 10 5 - 15    Comment: Performed at [facility], [address], [place], [place] [address]  CBC     Status: Abnormal   Collection Time: 10/17/23  8:43 AM  Result Value Ref Range   WBC 6.5 4.0 - 10.5 K/uL   RBC 3.40 (L) 4.22 - 5.81 MIL/uL   Hemoglobin 10.3 (L) 13.0 - 17.0 g/dL   HCT 13.2 (L) 44.0 - 10.2 %   MCV 101.5 (H) 80.0 - 100.0 fL   MCH 30.3 26.0 - 34.0 pg   MCHC 29.9 (L) 30.0 - 36.0 g/dL   RDW 72.5 (H) 36.6 - 44.0 %   Platelets 217 150 - 400 K/uL   nRBC 0.0 0.0 - 0.2 %    Comment: Performed at [facility], [address], [place], [place] [address]  Protime-INR     Status: None   Collection Time: 10/17/23  8:43 AM  Result Value Ref Range   Prothrombin Time 14.0 11.4 - 15.2 seconds   INR 1.1 0.8 - 1.2    Comment:  (  NOTE) INR goal varies based on device and disease states. Performed at [facility], [address], [place], [place] [address]   Ethanol     Status: None   Collection Time: 10/17/23  8:48 AM  Result Value Ref Range   Alcohol, Ethyl (B) <10 <10 mg/dL    Comment: (NOTE) Lowest detectable limit for serum alcohol is 10 mg/dL.  For medical purposes only. Performed at [facility], [address] [place], [place] [address]   Sample to Blood Bank     Status: None   Collection Time: 10/17/23  8:48 AM  Result Value Ref Range   Blood Bank Specimen SAMPLE AVAILABLE FOR TESTING    Sample Expiration      10/20/2023,2359 Performed at [facility], [address] [place], [place] [address]   I-Stat Chem 8, ED     Status: Abnormal   Collection Time: 10/17/23  8:59 AM  Result Value Ref Range   Sodium 138 135 - 145 mmol/L   Potassium 4.2 3.5 - 5.1 mmol/L   Chloride 101 98 - 111 mmol/L   BUN 40 (H) 8 - 23 mg/dL   Creatinine, Ser 5.95 (H) 0.61 - 1.24 mg/dL   Glucose, Bld 84 70 - 99 mg/dL    Comment: Glucose reference range applies only to samples taken after fasting for at least 8 hours.   Calcium, Ion 0.99 (L) 1.15 - 1.40 mmol/L   TCO2 26 22 - 32 mmol/L   Hemoglobin 11.2 (L) 13.0 - 17.0 g/dL   HCT 63.8 (L) 75.6 - 43.3 %  I-Stat Lactic Acid, ED     Status: None   Collection Time: 10/17/23  9:00 AM  Result Value Ref Range   Lactic Acid, Venous 1.0 0.5 - 1.9 mmol/L    CT Cervical Spine Wo Contrast  Result Date: 10/17/2023 CLINICAL DATA:  Neck trauma (Age >= 65y); Head trauma, moderate-severe EXAM: CT HEAD WITHOUT CONTRAST CT CERVICAL SPINE WITHOUT CONTRAST TECHNIQUE: Multidetector CT imaging of the head and cervical spine was performed following the standard protocol without intravenous contrast. Multiplanar CT image reconstructions of the cervical spine were also generated. RADIATION DOSE REDUCTION: This exam was performed according to the departmental  dose-optimization program which includes automated exposure control, adjustment of the mA and/or kV according to patient size and/or use of iterative reconstruction technique. COMPARISON:  None Available. FINDINGS: CT HEAD FINDINGS Brain: No hemorrhage. No hydrocephalus. No extra-axial fluid collection. No CT evidence of an acute cortical infarct. No mass effect. No mass lesion. Vascular: No hyperdense vessel or unexpected calcification. Skull: Normal. Negative for fracture or focal lesion. Sinuses/Orbits: No middle ear or mastoid effusion. Paranasal sinuses are notable for mucosal thickening in the right sphenoid bilateral ethmoid sinuses. Orbits are unremarkable. Other: None. CT CERVICAL SPINE FINDINGS Alignment: Normal. Skull base and vertebrae: No acute fracture. No primary bone lesion or focal pathologic process. Soft tissues and spinal canal: No prevertebral fluid or swelling. No visible canal hematoma. Disc levels: There is likely moderate spinal canal narrowing at C5-C6 Upper chest: Biapical emphysema and pleuroparenchymal scarring. Other: Severe atherosclerotic calcification of the carotid bifurcation on the right IMPRESSION: 1. No acute intracranial abnormality. 2. No acute fracture or traumatic subluxation of the cervical spine. 3. Severe atherosclerotic calcification of the carotid bifurcation on the right. Electronically Signed   By: Lorenza Cambridge M.D.   On: 10/17/2023 09:54   CT Head Wo Contrast  Result Date: 10/17/2023 CLINICAL DATA:  Neck trauma (  Age >= 65y); Head trauma, moderate-severe EXAM: CT HEAD WITHOUT CONTRAST CT CERVICAL SPINE WITHOUT CONTRAST TECHNIQUE: Multidetector CT imaging of the head and cervical spine was performed following the standard protocol without intravenous contrast. Multiplanar CT image reconstructions of the cervical spine were also generated. RADIATION DOSE REDUCTION: This exam was performed according to the departmental dose-optimization program which includes  automated exposure control, adjustment of the mA and/or kV according to patient size and/or use of iterative reconstruction technique. COMPARISON:  None Available. FINDINGS: CT HEAD FINDINGS Brain: No hemorrhage. No hydrocephalus. No extra-axial fluid collection. No CT evidence of an acute cortical infarct. No mass effect. No mass lesion. Vascular: No hyperdense vessel or unexpected calcification. Skull: Normal. Negative for fracture or focal lesion. Sinuses/Orbits: No middle ear or mastoid effusion. Paranasal sinuses are notable for mucosal thickening in the right sphenoid bilateral ethmoid sinuses. Orbits are unremarkable. Other: None. CT CERVICAL SPINE FINDINGS Alignment: Normal. Skull base and vertebrae: No acute fracture. No primary bone lesion or focal pathologic process. Soft tissues and spinal canal: No prevertebral fluid or swelling. No visible canal hematoma. Disc levels: There is likely moderate spinal canal narrowing at C5-C6 Upper chest: Biapical emphysema and pleuroparenchymal scarring. Other: Severe atherosclerotic calcification of the carotid bifurcation on the right IMPRESSION: 1. No acute intracranial abnormality. 2. No acute fracture or traumatic subluxation of the cervical spine. 3. Severe atherosclerotic calcification of the carotid bifurcation on the right. Electronically Signed   By: Lorenza Cambridge M.D.   On: 10/17/2023 09:54   DG Hip Unilat W or Wo Pelvis 2-3 Views Left  Result Date: 10/17/2023 CLINICAL DATA:  72 year old male status post fall with lower extremity foreshortening. EXAM: DG HIP (WITH OR WITHOUT PELVIS) 2-3V LEFT COMPARISON:  CT Abdomen and Pelvis 08/31/2019. FINDINGS: AP portable supine views of the pelvis and left hip at 0841 hours. Left femoral neck fracture with varus impaction. The intertrochanteric segment appears to remain intact on these images. Left femoral head appears to remain normally located. No superimposed pelvis fracture identified. Grossly intact proximal  right femur. Nonobstructed bowel gas pattern. Aortoiliac calcified atherosclerosis. Left iliac artery vascular stent is chronic. IMPRESSION: Acute Left femoral neck fracture with varus impaction. No superimposed pelvis fracture identified. Electronically Signed   By: Odessa Fleming M.D.   On: 10/17/2023 09:01   DG Chest Portable 1 View  Result Date: 10/17/2023 CLINICAL DATA:  72 year old male status post fall with left hip fracture. EXAM: PORTABLE CHEST 1 VIEW COMPARISON:  Chest CT 01/19/2023 and earlier. FINDINGS: Portable AP semi upright view at 0838 hours. Chronic large lung volumes. Chronic sternotomy. Mediastinal contours remain within normal limits. Graham lung markings. No pneumothorax, pulmonary edema, acute lung opacity. Graham visualized osseous structures. IMPRESSION: COPD.  No acute cardiopulmonary abnormality. Electronically Signed   By: Odessa Fleming M.D.   On: 10/17/2023 09:00    Review of Systems  HENT:  Negative for ear discharge, ear pain, hearing loss and tinnitus.   Eyes:  Negative for photophobia and pain.  Respiratory:  Negative for cough and shortness of breath.   Cardiovascular:  Negative for chest pain.  Gastrointestinal:  Negative for abdominal pain, nausea and vomiting.  Genitourinary:  Negative for dysuria, flank pain, frequency and urgency.  Musculoskeletal:  Positive for arthralgias (Left hip). Negative for back pain, myalgias and neck pain.  Neurological:  Negative for dizziness and headaches.  Hematological:  Does not bruise/bleed easily.  Psychiatric/Behavioral:  The patient is not nervous/anxious.    Blood pressure 117/66, pulse 78,  temperature 99.6 F (37.6 C), temperature source Oral, resp. rate (!) 21, SpO2 100%. Physical Exam Constitutional:      General: He is not in acute distress.    Appearance: He is well-developed. He is not diaphoretic.  HENT:     Head: Normocephalic and atraumatic.  Eyes:     General: No scleral icterus.       Right eye: No discharge.         Left eye: No discharge.     Conjunctiva/sclera: Conjunctivae normal.  Cardiovascular:     Rate and Rhythm: Normal rate and regular rhythm.  Pulmonary:     Effort: Pulmonary effort is normal. No respiratory distress.  Musculoskeletal:     Cervical back: Normal range of motion.     Comments: LLE No traumatic wounds, ecchymosis, or rash  Severe TTP hip  No knee or ankle effusion  Knee Graham to varus/ valgus and anterior/posterior stress  Sens DPN, SPN, TN intact  Motor EHL, ext, flex, evers 5/5  DP 2+, PT 2+, No significant edema  Skin:    General: Skin is warm and dry.  Neurological:     Mental Status: He is alert.  Psychiatric:        Mood and Affect: Mood normal.        Behavior: Behavior normal.     Assessment/Plan: Left hip fx -- Plan THA, hopefully today with Dr. Blanchie Dessert. Please keep NPO.    Freeman Caldron, PA-C Orthopedic Surgery [phone] 10/17/2023, 10:47 AM

## 2023-10-18 ENCOUNTER — Encounter (HOSPITAL_COMMUNITY): Payer: Self-pay | Admitting: Orthopedic Surgery

## 2023-10-18 ENCOUNTER — Inpatient Hospital Stay (HOSPITAL_COMMUNITY): Payer: Medicare HMO

## 2023-10-18 DIAGNOSIS — S72002A Fracture of unspecified part of neck of left femur, initial encounter for closed fracture: Secondary | ICD-10-CM | POA: Diagnosis not present

## 2023-10-18 DIAGNOSIS — Z951 Presence of aortocoronary bypass graft: Secondary | ICD-10-CM | POA: Diagnosis not present

## 2023-10-18 DIAGNOSIS — E785 Hyperlipidemia, unspecified: Secondary | ICD-10-CM | POA: Diagnosis not present

## 2023-10-18 DIAGNOSIS — N184 Chronic kidney disease, stage 4 (severe): Secondary | ICD-10-CM | POA: Diagnosis not present

## 2023-10-18 LAB — BASIC METABOLIC PANEL
Anion gap: 11 (ref 5–15)
BUN: 49 mg/dL — ABNORMAL HIGH (ref 8–23)
CO2: 24 mmol/L (ref 22–32)
Calcium: 7.8 mg/dL — ABNORMAL LOW (ref 8.9–10.3)
Chloride: 101 mmol/L (ref 98–111)
Creatinine, Ser: 5.16 mg/dL — ABNORMAL HIGH (ref 0.61–1.24)
GFR, Estimated: 11 mL/min — ABNORMAL LOW (ref >60)
Glucose, Bld: 132 mg/dL — ABNORMAL HIGH (ref 70–99)
Potassium: 5.4 mmol/L — ABNORMAL HIGH (ref 3.5–5.1)
Sodium: 136 mmol/L (ref 135–145)

## 2023-10-18 LAB — CBC
HCT: 29.2 % — ABNORMAL LOW (ref 39.0–52.0)
Hemoglobin: 8.9 g/dL — ABNORMAL LOW (ref 13.0–17.0)
MCH: 31.1 pg (ref 26.0–34.0)
MCHC: 30.5 g/dL (ref 30.0–36.0)
MCV: 102.1 fL — ABNORMAL HIGH (ref 80.0–100.0)
Platelets: 215 10*3/uL (ref 150–400)
RBC: 2.86 MIL/uL — ABNORMAL LOW (ref 4.22–5.81)
RDW: 15.8 % — ABNORMAL HIGH (ref 11.5–15.5)
WBC: 8.4 10*3/uL (ref 4.0–10.5)
nRBC: 0 % (ref 0.0–0.2)

## 2023-10-18 LAB — MAGNESIUM: Magnesium: 2.4 mg/dL (ref 1.7–2.4)

## 2023-10-18 MED ORDER — SODIUM CHLORIDE 0.9 % IV SOLN: INTRAVENOUS | Status: DC

## 2023-10-18 MED ORDER — CHLORHEXIDINE GLUCONATE CLOTH 2 % EX PADS
6.0000 | MEDICATED_PAD | Freq: Every day | CUTANEOUS | Status: DC
  Administered 2023-10-18 – 2023-10-21 (×3): 6 via TOPICAL

## 2023-10-18 MED ORDER — SODIUM ZIRCONIUM CYCLOSILICATE 5 G PO PACK
5.0000 g | PACK | Freq: Once | ORAL | Status: AC
  Administered 2023-10-18: 5 g via ORAL
  Filled 2023-10-18: qty 1

## 2023-10-18 MED ORDER — MUPIROCIN 2 % EX OINT
1.0000 | TOPICAL_OINTMENT | Freq: Two times a day (BID) | CUTANEOUS | Status: DC
  Administered 2023-10-18 – 2023-10-20 (×6): 1 via NASAL
  Filled 2023-10-18 (×3): qty 22

## 2023-10-18 MED ORDER — CALCIUM CARBONATE ANTACID 500 MG PO CHEW
400.0000 mg | CHEWABLE_TABLET | Freq: Three times a day (TID) | ORAL | Status: DC | PRN
  Administered 2023-10-18 (×2): 400 mg via ORAL
  Filled 2023-10-18 (×4): qty 2

## 2023-10-18 MED ORDER — FENTANYL CITRATE PF 50 MCG/ML IJ SOSY: 25.0000 ug | PREFILLED_SYRINGE | INTRAMUSCULAR | Status: DC | PRN

## 2023-10-18 NOTE — Progress Notes (Signed)
     Subjective: Patient reports pain as mild.  Lying comfortably in bed this morning. Denies distal n/t. No concerns.  Objective:   VITALS:   Vitals:   10/17/23 1722 10/17/23 2024 10/17/23 2052 10/18/23 0352  BP: (!) 122/56     Pulse: 69     Resp: 15     Temp: 97.6 F (36.4 C)  98 F (36.7 C) 98.6 F (37 C)  TempSrc: Oral  Oral Oral  SpO2: 100% 99%    Weight:      Height:        Sensation intact distally Intact pulses distally Dorsiflexion/Plantar flexion intact Incision: dressing C/D/I No cellulitis present Compartment soft    Lab Results  Component Value Date   WBC 8.4 10/18/2023   HGB 8.9 (L) 10/18/2023   HCT 29.2 (L) 10/18/2023   MCV 102.1 (H) 10/18/2023   PLT 215 10/18/2023   BMET    Component Value Date/Time   NA 138 10/17/2023 0859   NA 141 01/13/2022 0000   K 4.2 10/17/2023 0859   CL 101 10/17/2023 0859   CO2 26 10/17/2023 0843   GLUCOSE 84 10/17/2023 0859   BUN 40 (H) 10/17/2023 0859   BUN 32 (A) 01/13/2022 0000   CREATININE 5.00 (H) 10/17/2023 0859   CREATININE 2.63 (H) 01/24/2020 1057   CALCIUM 8.1 (L) 10/17/2023 0843   EGFR 22.0 06/27/2023 1147   GFRNONAA 11 (L) 10/17/2023 0843   GFRNONAA 24 (L) 01/24/2020 1057    Xray: THA components in good position no adverse features  Assessment/Plan: 1 Day Post-Op   Principal Problem:   Hip fracture (HCC) Active Problems:   Hx of CABG   HTN (hypertension)   Hyperlipidemia   Hypothyroidism   MGUS (monoclonal gammopathy of unknown significance)   Chronic kidney disease  S/p L THA for FN Fx 10/17/23  Post op recs: WB: WBAT LLE, No formal hip precautions Abx: Vanco and gentamicin Imaging: PACU pelvis Xray Dressing: Aquacell, keep intact until follow up DVT prophylaxis: Lovenox 40 mg daily x 4 weeks starting POD1 Follow up: 2 weeks after surgery for a wound check with Dr. Blanchie Dessert at [facility].  Address: [address], [place], [place] [address]  Office Phone:  [phone]   Joen Laura 10/18/2023, 7:18 AM   Weber Cooks, MD  Contact information:   [phone] 7am-5pm epic message Dr. Blanchie Dessert, or call office for patient follow up: [phone] After hours and holidays please check [email or web address] for group call information for [facility]

## 2023-10-18 NOTE — Consult Note (Signed)
[facility] Renal Consultation Note  Requesting MD: Mauro Kaufmann, MD Indication for Consultation:  AKI on CKD  Chief complaint: fell at home and broke hip  HPI:  Brian Graham is a 72 y.o. 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  He felt dizzy before the fall.  He didn't pass out per his wife.  He was found to have an acute left femoral neck fracture.  He underwent left total hip arthroplasty on 11/25 with orthopedics.  Nephrology is consulted for assistance with management of AKI on CKD.  He follows with Dr. Allena Katz in our office at [facility]. Earlier this afternoon, he had in/out cath with 800 mL at about 1:30pm - he had no urge to void.  He has previously required a foley.  He is ok with getting again if needed.  He manages his meds.  He states that he has an appointment to see Dr. Allena Katz around the second week of December.  He reports that he is not on oxygen at home though he does have it available; hasn't needed but has had it PRN since an episode of covid.  He denies any NSAID use.    Creatinine, Ser  Date/Time Value Ref Range Status  10/18/2023 06:05 AM 5.16 (H) 0.61 - 1.24 mg/dL Final  16/08/9603 54:09 AM 5.00 (H) 0.61 - 1.24 mg/dL Final  81/19/1478 29:56 AM 5.05 (H) 0.61 - 1.24 mg/dL Final  21/30/8657 84:69 AM 3.55 (H) 0.40 - 1.50 mg/dL Final  62/95/2841 32:44 AM 2.55 (H) 0.40 - 1.50 mg/dL Final  11/24/7251 66:44 PM 2.93 (H) 0.61 - 1.24 mg/dL Final  03/47/4259 56:38 AM 2.11 (H) 0.40 - 1.50 mg/dL Final  75/64/3329 51:88 AM 3.24 (H) 0.40 - 1.50 mg/dL Final  41/66/0630 16:01 PM 3.08 (H) 0.61 - 1.24 mg/dL Final  09/32/3557 32:20 AM 2.49 (H) 0.40 - 1.50 mg/dL Final  25/42/7062 37:62 AM 2.54 (H) 0.40 - 1.50 mg/dL Final  83/15/1761 60:73 AM 2.44 (H) 0.40 - 1.50 mg/dL Final  71/04/2693 85:46 AM 1.70 (H) 0.40 - 1.50 mg/dL Final  27/01/5008 38:18 AM 2.08 (H)  0.40 - 1.50 mg/dL Final  29/93/7169 67:89 AM 1.67 (H) 0.40 - 1.50 mg/dL Final  38/08/1750 02:58 AM 1.88 (H) 0.40 - 1.50 mg/dL Final  52/77/8242 35:36 AM 1.52 (H) 0.61 - 1.24 mg/dL Final  14/43/1540 08:67 AM 1.35 0.40 - 1.50 mg/dL Final  61/95/0932 67:12 PM 1.70 (H) 0.40 - 1.50 mg/dL Final  45/80/9983 38:25 AM 2.29 (H) 0.61 - 1.24 mg/dL Final  05/39/7673 41:93 AM 2.63 (H) 0.61 - 1.24 mg/dL Final  79/12/4095 35:32 AM 2.96 (H) 0.61 - 1.24 mg/dL Final    Comment:    DELTA CHECK NOTED  12/06/2018 05:40 AM 4.21 (H) 0.61 - 1.24 mg/dL Final  99/24/2683 41:96 AM 6.03 (H) 0.61 - 1.24 mg/dL Final  22/29/7989 21:19 PM 7.22 (H) 0.61 - 1.24 mg/dL Final  41/74/0814 48:18 AM 7.12 (HH) 0.40 - 1.50 mg/dL Final  56/31/4970 26:37 PM 1.59 (H) 0.40 - 1.50 mg/dL Final  85/88/5027 74:12 AM 0.97 0.40 - 1.50 mg/dL Final  87/86/7672 09:47 AM 1.04 0.40 - 1.50 mg/dL Final  09/62/8366 29:47 AM 1.21 0.61 - 1.24 mg/dL Final  65/46/5035 46:56 AM 1.13 0.61 - 1.24 mg/dL Final  81/27/5170 01:74 AM 1.10 0.61 - 1.24 mg/dL Final  94/49/6759 16:38 AM 0.80 0.61 - 1.24 mg/dL Final  46/65/9935  10:44 AM 0.76 0.61 - 1.24 mg/dL Final  16/08/9603 54:09 PM 0.73 0.61 - 1.24 mg/dL Final  81/19/1478 29:56 AM 0.83 0.61 - 1.24 mg/dL Final  21/30/8657 84:69 AM 0.72 0.61 - 1.24 mg/dL Final  62/95/2841 32:44 AM 0.94 0.61 - 1.24 mg/dL Final  11/24/7251 66:44 AM 0.73 (L) 0.76 - 1.27 mg/dL Final     PMHx:   Past Medical History:  Diagnosis Date   Allergy 1980   Anxiety    Arthritis    "left knee" (08/05/2016)   Bruises easily    CAD (coronary artery disease) of artery bypass graft 08/05/2016   Around age 63. CABG - 3 vessel.    Chronic kidney disease    Colon polyp    COPD, 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   Alpha 1 studies normal per prior pulmonary group notes Smoked 40 years, quit in 2012 June 2016 PFT from prior pulmonary group: "significant obstruction" FEV1 1.58L (47% pred), Residual volume 171% pred, DLCO 59% pred Simple  Spirometry>> 09/15/2016 ratio 42% FEV1 1.02 L / 31%    Depression 2020   Emphysema of lung (HCC)    Essential tremor 06/11/2016   Plans to see Dr. Arbutus Leas   Former smoker 09/01/2016   Quit 2012. 40 pack years at least.    GERD (gastroesophageal reflux disease)    Headache    History of blood transfusion 08/1997   "when he had his heart surgery"   History of shingles 1970-2013 X 3   HTN (hypertension) 08/05/2016   Amlodipine 5mg , atenolol 50mg  BID, benazepril 20mg    Hyperlipemia    Hypothyroidism    Lumbar disc disease    MGUS (monoclonal gammopathy of unknown significance)    Myocardial infarction (HCC) 08/1997   Peripheral vascular disease (HCC)    PONV (postoperative nausea and vomiting)    Urinary tract infection 09/03/2017   Wears glasses     Past Surgical History:  Procedure Laterality Date   AORTOGRAM Right 08/31/2017   Procedure: AORTOGRAM BILATERAL PELVIC ANGIOGRAM WITH LEFT ILIAC ARTERY STENT;  Surgeon: Fransisco Hertz, MD;  Location: [facility] OR;  Service: Vascular;  Laterality: Right;   BACK SURGERY     CARDIAC CATHETERIZATION  08/1997   COLONOSCOPY  2014   CORONARY ARTERY BYPASS GRAFT  09/12/1997   "triple"   ESOPHAGOGASTRODUODENOSCOPY     INGUINAL HERNIA REPAIR Right 1961   KIDNEY REMOVED     KNEE RECONSTRUCTION Left 1960s - 1974 X 4   LAPAROSCOPIC ABLATION RENAL MASS  ~ 2014   "large abscess/pus ball"   LUMBAR DISC SURGERY  1990s   "they were wedged in the bowel"   PATELLA FRACTURE SURGERY Left 1963   PATELLA FRACTURE SURGERY Left ~ 1965   "removed knee cap"   ROBOT ASSISTED LAPAROSCOPIC NEPHRECTOMY Left 10/07/2017   Procedure: XI ROBOTIC ASSISTED LAPAROSCOPIC NEPHRECTOMY OF PELVIC KIDNEY;  Surgeon: Sebastian Ache, MD;  Location: [facility] ORS;  Service: Urology;  Laterality: Left;  Place robot patient tower at foot of bed like prostate per Dr. Berneice Heinrich. Pull extra staple loads.   TONSILLECTOMY     TOTAL HIP ARTHROPLASTY Left 10/17/2023   Procedure: LEFT TOTAL HIP  ARTHROPLASTY;  Surgeon: Joen Laura, MD;  Location: [facility] OR;  Service: Orthopedics;  Laterality: Left;    Family Hx:  Family History  Problem Relation Age of Onset   Alcohol abuse Mother    Alcohol abuse Father        not involved in life   Alcoholism Sister  Healthy Sister    Healthy Sister    Allergic rhinitis Neg Hx    Angioedema Neg Hx    Asthma Neg Hx    Eczema Neg Hx    Immunodeficiency Neg Hx    Urticaria Neg Hx     Social History:  reports that he quit smoking about 6 years ago. His smoking use included cigarettes. He started smoking about 51 years ago. He has a 45 pack-year smoking history. He has never used smokeless tobacco. He reports current alcohol use of about 7.0 standard drinks of alcohol per week. He reports that he does not use drugs.  Allergies:  Allergies  Allergen Reactions   Contrast Media [Iodinated Contrast Media] Other (See Comments)    NOT ALLOWED DUE TO KIDNEY ISSUES.   Other Itching and Rash    Gel used for ultrasound SEVERELY broke out the skin    Keflex [Cephalexin] Diarrhea, Nausea Only and Other (See Comments)    Insomnia   Sulfa Antibiotics Other (See Comments)    Insomnia   Ciprofloxacin Diarrhea   Levaquin [Levofloxacin] Other (See Comments)    Insomnia    Omeprazole Diarrhea    Medications: Prior to Admission medications   Medication Sig Start Date End Date Taking? Authorizing Provider  acetaminophen (TYLENOL) 500 MG tablet Take 500-1,000 mg by mouth every 6 (six) hours as needed (for pain).    Yes [provider]  albuterol (PROVENTIL) (2.5 MG/3ML) 0.083% nebulizer solution INHALE 1 VIAL VIA NEBULIZER EVERY 6 HOURS AS NEEDED FOR WHEEZING OR SHORTNESS OF BREATH 07/06/23  Yes Shelva Majestic, MD  aspirin EC 81 MG tablet Take 81 mg by mouth daily.   Yes [provider]  atenolol (TENORMIN) 50 MG tablet TAKE 1 TABLET DAILY 01/21/23  Yes Shelva Majestic, MD  budesonide-formoterol [facility]) 160-4.5 MCG/ACT  inhaler Inhale 2 puffs into the lungs 2 (two) times daily. 11/18/22  Yes Omar Person, MD  desonide (DESOWEN) 0.05 % cream Apply topically 2 (two) times daily. 7-10 days as needed for eczema 04/22/21  Yes Shelva Majestic, MD  levothyroxine (SYNTHROID) 88 MCG tablet Take 1 tablet (88 mcg total) by mouth daily before breakfast. 07/22/23  Yes Shelva Majestic, MD  mirtazapine (REMERON) 15 MG tablet Take 1 tablet (15 mg total) by mouth at bedtime. 09/28/23  Yes Iva Boop, MD  rosuvastatin (CRESTOR) 40 MG tablet TAKE 1 TABLET DAILY (NEED APPOINTMENT) 09/02/23  Yes Shelva Majestic, MD  SPIRIVA RESPIMAT 2.5 MCG/ACT AERS USE 2 INHALATIONS DAILY (NEED APPOINTMENT FOR FURTHER REFILLS) 03/09/23  Yes Omar Person, MD  traMADol (ULTRAM) 50 MG tablet TAKE 1 TABLET (50 MG TOTAL) BY MOUTH 2 (TWO) TIMES DAILY AS NEEDED FOR MODERATE PAIN OR SEVERE PAIN. 03/02/23  Yes Shelva Majestic, MD  albuterol (VENTOLIN HFA) 108 (90 Base) MCG/ACT inhaler USE 2 INHALATIONS EVERY 4 HOURS AS NEEDED 04/19/23   Shelva Majestic, MD   I have reviewed the patient's current and reported prior to admission medications.  Labs:     Latest Ref Rng & Units 10/18/2023    6:05 AM 10/17/2023    8:59 AM 10/17/2023    8:43 AM  BMP  Glucose 70 - 99 mg/dL 660  84  86   BUN 8 - 23 mg/dL 49  40  36   Creatinine 0.61 - 1.24 mg/dL 6.30  1.60  1.09   Sodium 135 - 145 mmol/L 136  138  137   Potassium 3.5 -  5.1 mmol/L 5.4  4.2  4.2   Chloride 98 - 111 mmol/L 101  101  101   CO2 22 - 32 mmol/L 24   26   Calcium 8.9 - 10.3 mg/dL 7.8   8.1     Urinalysis    Component Value Date/Time   COLORURINE YELLOW 12/04/2018 2206   APPEARANCEUR HAZY (A) 12/04/2018 2206   APPEARANCEUR Clear 08/23/2016 1141   LABSPEC 1.025 09/19/2019 1203   PHURINE 6.5 09/19/2019 1203   GLUCOSEU NEGATIVE 09/19/2019 1203   GLUCOSEU NEGATIVE 11/28/2018 1212   HGBUR LARGE (A) 09/19/2019 1203   BILIRUBINUR NEGATIVE 09/19/2019 1203   BILIRUBINUR  negative 09/03/2017 1155   BILIRUBINUR Negative 08/23/2016 1141   KETONESUR NEGATIVE 09/19/2019 1203   PROTEINUR >=300 (A) 09/19/2019 1203   UROBILINOGEN 0.2 09/19/2019 1203   NITRITE NEGATIVE 09/19/2019 1203   LEUKOCYTESUR MODERATE (A) 09/19/2019 1203     ROS:  Pertinent items noted in HPI and remainder of comprehensive ROS otherwise negative.  Physical Exam: Vitals:   10/18/23 0750 10/18/23 1532  BP: 119/67 (!) 103/58  Pulse: 69 71  Resp:    Temp: 97.9 F (36.6 C) (!) 97.5 F (36.4 C)  SpO2: 97% 97%     General: elderly male in bed in NAD  HEENT: NCAT Eyes: EOMI sclera anicteric Neck: supple trachea midline  Heart: S1S2 no rub Lungs: clear but reduced; normal work of breathing; intermittent cough; on 2 liters oxygen Abdomen: soft/nt/nd Extremities: no edema; no cyanosis or clubbing Skin: no rash on extremities exposed Neuro: alert and oriented x 3 provides hx and follows commands  Psych normal mood and affect  Assessment/Plan:  # AKI  - Multifactorial.  Urinary retention noted today.  Given his history he may also have pre-renal as well as ischemic insults, too - he had perioperative hypotension noted on 11/25.  Note dizziness preceded his fall.  - Will give normal saline at 75 ml/hr x 12 hours    - Renal panel in AM   - Check post-void residual bladder scan and place a foley if over 250 mL urine retained - Will obtain renal ultrasound as well  - Would avoid NSAID's to avoid exacerbating AKI.  Preferred IV narcotics would be fentanyl or dilaudid if needed (would avoid morphine)  # CKD stage IV - baseline Cr 3.55 on labs this summer at [facility] - he is also s/p left nephrectomy so has decreased renal reserve - Follows with [facility] - Dr. Allena Katz  # HTN - hx of such  - Note hypotension on 11/25 - improved   # Hyperkalemia - Changed to renal diet - lokelma once today   # Acute left femoral neck fracture   - s/p left total hip arthroplasty on 11/25 with  orthopedics. - Please avoid NSAID's  # Acute urinary retention - with his dizziness would be hesitant to add flomax today but reassess tomorrow after gentle fluids   Disposition- would continue inpatient monitoring    Estanislado Emms 10/18/2023, 5:20 PM

## 2023-10-18 NOTE — Evaluation (Signed)
Physical Therapy Evaluation Patient Details Name: Brian Graham MRN: [id number] DOB: 01/16/1951 Today's Date: 10/18/2023  History of Present Illness  Patient is a 72 years old male with past medical history of coronary artery disease status post bypass, CKD, history of nephrectomy, COPD, GERD, hypertension, hyperlipidemia, hypothyroidism, presented hospital after sustaining a fall.  Patient stated that he was dizzy when he got up last night to use the bathroom and he sat back down until it passed then went in but got dizzy again and fell.  Patient did not lose consciousness.  He had immediate left hip pain and could not get up. Pt found to have L hip fx. Pt is now s/p L THA on 11/25.  Clinical Impression  Pt presents with admitting diagnosis above. Session today limited due to symptomatic low BP. BP: 114/61 seated, BP: 88/51 (59) standing. Pt required Mod A for bed mobility and +2 Min A to stand. PTA pt reports he was fully independent with no AD. Patient will benefit from continued inpatient follow up therapy, <3 hours/day. PT will continue to follow.       If plan is discharge home, recommend the following: A lot of help with walking and/or transfers;A lot of help with bathing/dressing/bathroom;Assistance with cooking/housework;Direct supervision/assist for medications management;Assist for transportation;Help with stairs or ramp for entrance   Can travel by private vehicle   No    Equipment Recommendations Other (comment) (Per accepting facility)  Recommendations for Other Services       Functional Status Assessment Patient has had a recent decline in their functional status and demonstrates the ability to make significant improvements in function in a reasonable and predictable amount of time.     Precautions / Restrictions Precautions Precautions: Fall Restrictions Weight Bearing Restrictions: Yes LLE Weight Bearing: Weight bearing as tolerated      Mobility  Bed  Mobility Overal bed mobility: Needs Assistance Bed Mobility: Supine to Sit, Sit to Supine     Supine to sit: Mod assist Sit to supine: Mod assist   General bed mobility comments: Assistance with LLE and trunk elevation.    Transfers Overall transfer level: Needs assistance Equipment used: Rolling walker (2 wheels) Transfers: Sit to/from Stand Sit to Stand: +2 physical assistance, Min assist           General transfer comment: Pt noted to be orthostatic. Further mobility deferred for safety. Cues for hand placement    Ambulation/Gait               General Gait Details: Deferred  Stairs            Wheelchair Mobility     Tilt Bed    Modified Rankin (Stroke Patients Only)       Balance Overall balance assessment: Needs assistance Sitting-balance support: Bilateral upper extremity supported, Feet supported Sitting balance-Leahy Scale: Poor Sitting balance - Comments: R lateral lean Postural control: Right lateral lean Standing balance support: Bilateral upper extremity supported, During functional activity Standing balance-Leahy Scale: Poor Standing balance comment: Reliant on RW                             Pertinent Vitals/Pain Pain Assessment Pain Assessment: 0-10 Pain Score: 3  Pain Location: L hip Pain Descriptors / Indicators: Constant, Discomfort, Grimacing Pain Intervention(s): Monitored during session, Repositioned    Home Living Family/patient expects to be discharged to:: Private residence Living Arrangements: Spouse/significant other Available Help at Discharge: Family;Available 24  hours/day Type of Home: House Home Access: Stairs to enter Entrance Stairs-Rails: None Entrance Stairs-Number of Steps: 1 Alternate Level Stairs-Number of Steps: 16 Home Layout: Two level;Able to live on main level with bedroom/bathroom Home Equipment: Other (comment);Rollator (4 wheels);Cane - single point;Grab bars - tub/shower;Shower seat  - built in;Hand held Land (oxygen)      Prior Function Prior Level of Function : Independent/Modified Independent;Driving;History of Falls (last six months) (1 fall yesterday)             Mobility Comments: Ind no AD ADLs Comments: Ind     Extremity/Trunk Assessment   Upper Extremity Assessment Upper Extremity Assessment: Overall WFL for tasks assessed    Lower Extremity Assessment Lower Extremity Assessment: LLE deficits/detail LLE Deficits / Details: L THA    Cervical / Trunk Assessment Cervical / Trunk Assessment: Normal  Communication   Communication Communication: No apparent difficulties  Cognition Arousal: Alert Behavior During Therapy: WFL for tasks assessed/performed Overall Cognitive Status: Within Functional Limits for tasks assessed                                 General Comments: Pt was A&Ox4 however at times would make some random comments completely unrelated to what was going on. Pt with very high fear of falling.        General Comments General comments (skin integrity, edema, etc.): BP: 114/61 seated, BP: 88/51 (59) standing.    Exercises     Assessment/Plan    PT Assessment Patient needs continued PT services  PT Problem List Decreased strength;Decreased range of motion;Decreased activity tolerance;Decreased balance;Decreased mobility;Decreased coordination;Decreased knowledge of use of DME;Decreased knowledge of precautions;Decreased safety awareness;Cardiopulmonary status limiting activity;Pain       PT Treatment Interventions DME instruction;Gait training;Stair training;Functional mobility training;Therapeutic activities;Therapeutic exercise;Balance training;Neuromuscular re-education;Patient/family education    PT Goals (Current goals can be found in the Care Plan section)  Acute Rehab PT Goals Patient Stated Goal: to go home    Frequency Min 1X/week     Co-evaluation                AM-PAC PT "6 Clicks" Mobility  Outcome Measure Help needed turning from your back to your side while in a flat bed without using bedrails?: A Little Help needed moving from lying on your back to sitting on the side of a flat bed without using bedrails?: A Lot Help needed moving to and from a bed to a chair (including a wheelchair)?: Total Help needed standing up from a chair using your arms (e.g., wheelchair or bedside chair)?: A Lot Help needed to walk in hospital room?: Total Help needed climbing 3-5 steps with a railing? : Total 6 Click Score: 10    End of Session Equipment Utilized During Treatment: Gait belt Activity Tolerance: Patient limited by pain;Treatment limited secondary to medical complications (Comment) (Low BP) Patient left: in bed;with call bell/phone within reach;with bed alarm set Nurse Communication: Mobility status PT Visit Diagnosis: Other abnormalities of gait and mobility (R26.89)    Time: 1135-1209 PT Time Calculation (min) (ACUTE ONLY): 34 min   Charges:   PT Evaluation $PT Eval Moderate Complexity: 1 Mod PT Treatments $Therapeutic Activity: 8-22 mins PT General Charges $$ ACUTE PT VISIT: 1 Visit         Shela Nevin, PT, DPT Acute Rehab Services [phone]   Gladys Damme 10/18/2023, 4:16 PM

## 2023-10-18 NOTE — TOC CAGE-AID Note (Signed)
Transition of Care [facility]) - CAGE-AID Screening   Patient Details  Name: Brian Graham MRN: [id number] Date of Birth: 1951-08-05  Transition of [facility]) CM/SW Contact:    Katha Hamming, RN Phone Number: 10/18/2023, 8:32 PM   Clinical Narrative:  Reports daily alcohol use, denies alcohol w/d symptoms if he skips a day. No resources indicated.  CAGE-AID Screening:    Have You Ever Felt You Ought to Cut Down on Your Drinking or Drug Use?: No Have People Annoyed You By Critizing Your Drinking Or Drug Use?: No Have You Felt Bad Or Guilty About Your Drinking Or Drug Use?: No Have You Ever Had a Drink or Used Drugs First Thing In The Morning to Steady Your Nerves or to Get Rid of a Hangover?: No CAGE-AID Score: 0  Substance Abuse Education Offered: No

## 2023-10-18 NOTE — Progress Notes (Signed)
[facility]  PROGRESS NOTE  Brian Graham ZOX:[id number] DOB: January 08, 1951 DOA: 10/17/2023 PCP: Shelva Majestic, MD   Brief HPI:   72 years old male with past medical history of coronary artery disease status post bypass, CKD, history of nephrectomy, COPD, GERD, hypertension, hyperlipidemia, hypothyroidism, presented hospital after sustaining a fall.  Patient stated that he was dizzy when he got up last night to use the bathroom and he sat back down until it passed then went in but got dizzy again and fell.  Patient did not lose consciousness.  He had immediate left hip pain and could not get up.  Hip x-ray of the left hip showed acute femoral neck fracture.  Orthopedic surgery was consulted     Assessment/Plan:   Left femoral neck fracture status post fall.   -Dizziness prior to fall, no loss of consciousness -Orthopedics was consulted, underwent left total hip arthroplasty -DVT prophylaxis with Lovenox for 4 weeks as per orthopedics    Dizziness/fall.   Continue IV fluids.  Hold antihypertensives.   -Check orthostatics every 8 hours   Acute kidney injury on  CKD stage IV, history of nephrectomy. -Baseline creatinine around 3.5 -Presented with creatinine of 5.0 -Will consult nephrology for further recommendations  Hyperkalemia -Potassium 5.4 -Will give 1 dose of Lokelma 5 g p.o. x 1 -Follow BMP in am  Urinary retention -Was unable to urinate, bladder scan showed greater than 400 mL of urine -In-N-Out cath performed, 800 cc of urine obtained -Will check renal ultrasound  History of severe COPD.   On bronchodilators will continue patient is not on oxygen at home.   History of hypertension history of CAD status post CABG Hold antihypertensives for now.  On atenolol at home.  -Started on IV hydration for 1 day    Hyperlipidemia Continue Crestor.   Hypothyroidism Continue Synthroid    Medications     Chlorhexidine Gluconate Cloth  6 each Topical Q0600    docusate sodium  100 mg Oral BID   enoxaparin  30 mg Subcutaneous Q24H   levothyroxine  88 mcg Oral QAC breakfast   mirtazapine  15 mg Oral QHS   mometasone-formoterol  2 puff Inhalation BID   mupirocin ointment  1 Application Nasal BID   rosuvastatin  40 mg Oral Daily   umeclidinium bromide  1 puff Inhalation Daily     Data Reviewed:   CBG:  No results for input(s): "GLUCAP" in the last 168 hours.  SpO2: 97 % O2 Flow Rate (L/min): 2 L/min    Vitals:   10/17/23 2024 10/17/23 2052 10/18/23 0352 10/18/23 0750  BP:    119/67  Pulse:    69  Resp:      Temp:  98 F (36.7 C) 98.6 F (37 C) 97.9 F (36.6 C)  TempSrc:  Oral Oral Oral  SpO2: 99%   97%  Weight:      Height:          Data Reviewed:  Basic Metabolic Panel: Recent Labs  Lab 10/17/23 0843 10/17/23 0859 10/18/23 0605  NA 137 138 136  K 4.2 4.2 5.4*  CL 101 101 101  CO2 26  --  24  GLUCOSE 86 84 132*  BUN 36* 40* 49*  CREATININE 5.05* 5.00* 5.16*  CALCIUM 8.1*  --  7.8*  MG  --   --  2.4    CBC: Recent Labs  Lab 10/17/23 0843 10/17/23 0859 10/18/23 0605  WBC 6.5  --  8.4  HGB  10.3* 11.2* 8.9*  HCT 34.5* 33.0* 29.2*  MCV 101.5*  --  102.1*  PLT 217  --  215    LFT Recent Labs  Lab 10/17/23 0843  AST 21  ALT 12  ALKPHOS 63  BILITOT 0.8  PROT 6.9  ALBUMIN 2.6*     Antibiotics: Anti-infectives (From admission, onward)    Start     Dose/Rate Route Frequency Ordered Stop   10/18/23 0200  vancomycin (VANCOCIN) IVPB 1000 mg/200 mL premix        1,000 mg 200 mL/hr over 60 Minutes Intravenous  Once 10/17/23 1722 10/18/23 0300   10/17/23 1300  gentamicin (GARAMYCIN) 340 mg in dextrose 5 % 100 mL IVPB        5 mg/kg  68 kg 108.5 mL/hr over 60 Minutes Intravenous On call to O.R. 10/17/23 1235 10/17/23 1343   10/17/23 1300  vancomycin (VANCOCIN) IVPB 1000 mg/200 mL premix        1,000 mg 200 mL/hr over 60 Minutes Intravenous On call to O.R. 10/17/23 1235 10/17/23 1900         DVT prophylaxis: Lovenox  Code Status: Full code  Family Communication: No family at bedside   CONSULTS    Subjective   Denies pain   Objective    Physical Examination:   General-appears in no acute distress Heart-S1-S2, regular, no murmur auscultated Lungs-clear to auscultation bilaterally, no wheezing or crackles auscultated Abdomen-soft, nontender, no organomegaly Extremities-no edema in the lower extremities Neuro-alert, oriented x3, no focal deficit noted  Status is: Inpatient:             Meredeth Ide   [facility] If 7PM-7AM, please contact night-coverage at [email or web address], Office  [phone]   10/18/2023, 9:26 AM  LOS: 1 day

## 2023-10-19 ENCOUNTER — Telehealth: Payer: Self-pay | Admitting: Internal Medicine

## 2023-10-19 ENCOUNTER — Inpatient Hospital Stay (HOSPITAL_COMMUNITY): Payer: Medicare HMO

## 2023-10-19 DIAGNOSIS — N179 Acute kidney failure, unspecified: Secondary | ICD-10-CM | POA: Diagnosis not present

## 2023-10-19 DIAGNOSIS — S72002A Fracture of unspecified part of neck of left femur, initial encounter for closed fracture: Secondary | ICD-10-CM | POA: Diagnosis not present

## 2023-10-19 DIAGNOSIS — N184 Chronic kidney disease, stage 4 (severe): Secondary | ICD-10-CM | POA: Diagnosis not present

## 2023-10-19 LAB — CBC
HCT: 26.9 % — ABNORMAL LOW (ref 39.0–52.0)
Hemoglobin: 8.1 g/dL — ABNORMAL LOW (ref 13.0–17.0)
MCH: 30 pg (ref 26.0–34.0)
MCHC: 30.1 g/dL (ref 30.0–36.0)
MCV: 99.6 fL (ref 80.0–100.0)
Platelets: 216 10*3/uL (ref 150–400)
RBC: 2.7 MIL/uL — ABNORMAL LOW (ref 4.22–5.81)
RDW: 15.6 % — ABNORMAL HIGH (ref 11.5–15.5)
WBC: 8.5 10*3/uL (ref 4.0–10.5)
nRBC: 0 % (ref 0.0–0.2)

## 2023-10-19 LAB — IRON AND TIBC
Iron: 10 ug/dL — ABNORMAL LOW (ref 45–182)
Saturation Ratios: 5 % — ABNORMAL LOW (ref 17.9–39.5)
TIBC: 197 ug/dL — ABNORMAL LOW (ref 250–450)
UIBC: 187 ug/dL

## 2023-10-19 LAB — RENAL FUNCTION PANEL
Albumin: 2.3 g/dL — ABNORMAL LOW (ref 3.5–5.0)
Anion gap: 16 — ABNORMAL HIGH (ref 5–15)
BUN: 65 mg/dL — ABNORMAL HIGH (ref 8–23)
CO2: 23 mmol/L (ref 22–32)
Calcium: 8 mg/dL — ABNORMAL LOW (ref 8.9–10.3)
Chloride: 97 mmol/L — ABNORMAL LOW (ref 98–111)
Creatinine, Ser: 6.71 mg/dL — ABNORMAL HIGH (ref 0.61–1.24)
GFR, Estimated: 8 mL/min — ABNORMAL LOW (ref >60)
Glucose, Bld: 127 mg/dL — ABNORMAL HIGH (ref 70–99)
Phosphorus: 7.5 mg/dL — ABNORMAL HIGH (ref 2.5–4.6)
Potassium: 5 mmol/L (ref 3.5–5.1)
Sodium: 136 mmol/L (ref 135–145)

## 2023-10-19 LAB — FERRITIN: Ferritin: 288 ng/mL (ref 24–336)

## 2023-10-19 MED ORDER — OXYCODONE HCL 5 MG PO TABS: 2.5000 mg | ORAL_TABLET | ORAL | 0 refills | Status: DC | PRN

## 2023-10-19 MED ORDER — SALINE SPRAY 0.65 % NA SOLN
1.0000 | NASAL | Status: DC | PRN
  Filled 2023-10-19: qty 44

## 2023-10-19 MED ORDER — FAMOTIDINE 20 MG PO TABS
20.0000 mg | ORAL_TABLET | Freq: Every day | ORAL | Status: DC
  Administered 2023-10-19 – 2023-10-20 (×2): 20 mg via ORAL
  Filled 2023-10-19 (×2): qty 1

## 2023-10-19 MED ORDER — TAMSULOSIN HCL 0.4 MG PO CAPS
0.4000 mg | ORAL_CAPSULE | Freq: Every day | ORAL | Status: DC
  Administered 2023-10-19 – 2023-10-20 (×2): 0.4 mg via ORAL
  Filled 2023-10-19 (×2): qty 1

## 2023-10-19 MED ORDER — ENOXAPARIN SODIUM 40 MG/0.4ML IJ SOSY: 40.0000 mg | PREFILLED_SYRINGE | INTRAMUSCULAR | 0 refills | Status: DC

## 2023-10-19 MED ORDER — GUAIFENESIN ER 600 MG PO TB12
600.0000 mg | ORAL_TABLET | Freq: Two times a day (BID) | ORAL | Status: DC
  Administered 2023-10-19 – 2023-10-20 (×3): 600 mg via ORAL
  Filled 2023-10-19 (×3): qty 1

## 2023-10-19 NOTE — Progress Notes (Signed)
Foley placed per documentation at 2300 11/26. Emptied at 5am with amount of 75ml.   Bladder scanned at 1128 today with 0 ml. Foley bag to standard drainage bag with about 75-100 ml in bag now.    Dr. Vallery Sa, nephrology at [facility].  Wife at [facility].

## 2023-10-19 NOTE — NC FL2 (Signed)
[place] MEDICAID [facility] LEVEL OF CARE FORM     IDENTIFICATION  Patient Name: Brian Graham Birthdate: 03/07/1951 Sex: male Admission Date (Current Location): 10/17/2023  [facility] and [place] Number:  Producer, television/film/video and Address:  [facility] [place], [address], [place], [place] [address]      Provider Number: [id number]  Attending Physician Name and Address:  [name], DO  Relative Name and Phone Number:  Hice,Charlotte Spouse [phone]  [phone]    Current Level of Care: Hospital Recommended Level of Care: Skilled Nursing Facility Prior Approval Number:    Date Approved/Denied:   PASRR Number:    Discharge Plan: SNF    Current Diagnoses: Patient Active Problem List   Diagnosis Date Noted   Hip fracture (HCC) 10/17/2023   Senile purpura (HCC) 05/03/2022   Vitamin D deficiency 01/20/2021   Vitamin B12 deficiency 01/20/2021   Hearing abnormally acute, left 04/03/2020   Healthcare maintenance 04/03/2020   Chronic kidney disease 08/13/2019   S/p nephrectomy 12/05/2018   MGUS (monoclonal gammopathy of unknown significance) 08/02/2018   Osteoporosis 06/26/2018   Perennial allergic rhinitis 01/24/2018   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   Constipation 09/03/2017   Anxiety 07/24/2017   Atherosclerosis of native arteries of extremity with intermittent claudication (HCC) 07/06/2017   Pyelonephritis 04/23/2017   Weight loss 04/23/2017   History of adenomatous polyp of colon 04/22/2017   Aortic atherosclerosis (HCC) 04/13/2017   Hyperlipidemia 04/04/2017   Hypothyroidism 04/04/2017   Seasonal allergies 04/04/2017   Myalgia 10/04/2016   Former smoker 09/01/2016   COPD, 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   Dysuria 08/23/2016   Chronic pain of both knees 08/23/2016   Hx of CABG 08/05/2016   CAD (coronary artery disease) of artery bypass graft 08/05/2016   HTN (hypertension) 08/05/2016   Essential tremor 06/11/2016   Tension  headache 06/11/2016    Orientation RESPIRATION BLADDER Height & Weight     Self, Time, Situation, Place  O2 Continent, Indwelling catheter Weight: 154 lb 11.2 oz (70.2 kg) Height:  5\' 9"  (175.3 cm)  BEHAVIORAL SYMPTOMS/MOOD NEUROLOGICAL BOWEL NUTRITION STATUS      Continent Diet (see discharge summary)  AMBULATORY STATUS COMMUNICATION OF NEEDS Skin   Extensive Assist Verbally Surgical wounds                       Personal Care Assistance Level of Assistance  Bathing, Feeding, Dressing Bathing Assistance: Limited assistance Feeding assistance: Limited assistance Dressing Assistance: Limited assistance     Functional Limitations Info  Sight, Hearing, Speech Sight Info: Adequate Hearing Info: Adequate Speech Info: Adequate    SPECIAL CARE FACTORS FREQUENCY  PT (By licensed PT), OT (By licensed OT)     PT Frequency: 5x week OT Frequency: 5x week            Contractures Contractures Info: Not present    Additional Factors Info  Code Status, Allergies Code Status Info: full Allergies Info: Contrast Media (Iodinated Contrast Media), Other, Keflex (Cephalexin), Sulfa Antibiotics, Ciprofloxacin, Levaquin (Levofloxacin), Omeprazole           Current Medications (10/19/2023):  This is the current hospital active medication list Current Facility-Administered Medications  Medication Dose Route Frequency Provider Last Rate Last Admin   acetaminophen (TYLENOL) tablet 500-1,000 mg  500-1,000 mg Oral Q6H PRN Pokhrel, Laxman, MD   1,000 mg at 10/18/23 1534   albuterol (PROVENTIL) (2.5 MG/3ML) 0.083% nebulizer solution 2.5 mg  2.5 mg Nebulization  Q4H PRN Pokhrel, Laxman, MD       calcium carbonate (TUMS - dosed in mg elemental calcium) chewable tablet 400 mg of elemental calcium  400 mg of elemental calcium Oral TID PRN Meredeth Ide, MD   400 mg of elemental calcium at 10/18/23 2047   Chlorhexidine Gluconate Cloth 2 % PADS 6 each  6 each Topical Q0600 Hillary Bow, DO    6 each at 10/18/23 0610   docusate sodium (COLACE) capsule 100 mg  100 mg Oral BID Pokhrel, Laxman, MD   100 mg at 10/19/23 1042   enoxaparin (LOVENOX) injection 30 mg  30 mg Subcutaneous Q24H Joen Laura, MD   30 mg at 10/19/23 1042   famotidine (PEPCID) tablet 20 mg  20 mg Oral Daily Marlin Canary U, DO   20 mg at 10/19/23 1207   fentaNYL (SUBLIMAZE) injection 25 mcg  25 mcg Intravenous Q2H PRN Meredeth Ide, MD       levothyroxine (SYNTHROID) tablet 88 mcg  88 mcg Oral QAC breakfast Pokhrel, Laxman, MD   88 mcg at 10/19/23 0555   methocarbamol (ROBAXIN) tablet 500 mg  500 mg Oral Q6H PRN Pokhrel, Laxman, MD   500 mg at 10/18/23 1534   Or   methocarbamol (ROBAXIN) injection 500 mg  500 mg Intravenous Q6H PRN Pokhrel, Laxman, MD       mirtazapine (REMERON) tablet 15 mg  15 mg Oral QHS Pokhrel, Laxman, MD   15 mg at 10/18/23 2047   mometasone-formoterol (DULERA) 200-5 MCG/ACT inhaler 2 puff  2 puff Inhalation BID Pokhrel, Laxman, MD   2 puff at 10/19/23 0816   mupirocin ointment (BACTROBAN) 2 % 1 Application  1 Application Nasal BID Hillary Bow, DO   1 Application at 10/19/23 1045   oxyCODONE (Oxy IR/ROXICODONE) immediate release tablet 5-10 mg  5-10 mg Oral Q4H PRN Joen Laura, MD   10 mg at 10/18/23 0353   polyethylene glycol (MIRALAX / GLYCOLAX) packet 17 g  17 g Oral Daily PRN Pokhrel, Laxman, MD   17 g at 10/18/23 1119   rosuvastatin (CRESTOR) tablet 40 mg  40 mg Oral Daily Pokhrel, Laxman, MD   40 mg at 10/19/23 1042   tamsulosin (FLOMAX) capsule 0.4 mg  0.4 mg Oral QPC supper Estanislado Emms, MD       umeclidinium bromide (INCRUSE ELLIPTA) 62.5 MCG/ACT 1 puff  1 puff Inhalation Daily Pokhrel, Laxman, MD   1 puff at 10/19/23 0816     Discharge Medications: Please see discharge summary for a list of discharge medications.  Relevant Imaging Results:  Relevant Lab Results:   Additional Information SSN: [id number]  Lorri Frederick, LCSW

## 2023-10-19 NOTE — Evaluation (Signed)
Clinical/Bedside Swallow Evaluation Patient Details  Name: Brian Graham MRN: [id number] Date of Birth: [birth year]  Today's Date: 10/19/2023 Time: SLP Start Time (ACUTE ONLY): 1650 SLP Stop Time (ACUTE ONLY): 1705 SLP Time Calculation (min) (ACUTE ONLY): 15 min  Past Medical History:  Past Medical History:  Diagnosis Date   Allergy 1980   Anxiety    Arthritis    "left knee" (08/05/2016)   Bruises easily    CAD (coronary artery disease) of artery bypass graft 08/05/2016   Around age 72. CABG - 3 vessel.    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 Arbutus Leas   Former smoker 09/01/2016   Quit 2012. 40 pack years at least.    GERD (gastroesophageal reflux disease)    Headache    History of blood transfusion 08/1997   "when he had his heart surgery"   History of shingles 1970-2013 X 3   HTN (hypertension) 08/05/2016   Amlodipine 5mg , atenolol 50mg  BID, benazepril 20mg    Hyperlipemia    Hypothyroidism    Lumbar disc disease    MGUS (monoclonal gammopathy of unknown significance)    Myocardial infarction (HCC) 08/1997   Peripheral vascular disease (HCC)    PONV (postoperative nausea and vomiting)    Urinary tract infection 09/03/2017   Wears glasses    Past Surgical History:  Past Surgical History:  Procedure Laterality Date   AORTOGRAM Right 08/31/2017   Procedure: AORTOGRAM BILATERAL PELVIC ANGIOGRAM WITH LEFT ILIAC ARTERY STENT;  Surgeon: Fransisco Hertz, MD;  Location: [facility] OR;  Service: Vascular;  Laterality: Right;   BACK SURGERY     CARDIAC CATHETERIZATION  08/1997    COLONOSCOPY  2014   CORONARY ARTERY BYPASS GRAFT  09/12/1997   "triple"   ESOPHAGOGASTRODUODENOSCOPY     INGUINAL HERNIA REPAIR Right 1961   KIDNEY REMOVED     KNEE RECONSTRUCTION Left 1960s - 1974 X 4   LAPAROSCOPIC ABLATION RENAL MASS  ~ 2014   "large abscess/pus ball"   LUMBAR DISC SURGERY  1990s   "they were wedged in the bowel"   PATELLA FRACTURE SURGERY Left 1963   PATELLA FRACTURE SURGERY Left ~ 1965   "removed knee cap"   ROBOT ASSISTED LAPAROSCOPIC NEPHRECTOMY Left 10/07/2017   Procedure: XI ROBOTIC ASSISTED LAPAROSCOPIC NEPHRECTOMY OF PELVIC KIDNEY;  Surgeon: Sebastian Ache, MD;  Location: [facility] ORS;  Service: Urology;  Laterality: Left;  Place robot patient tower at foot of bed like prostate per Dr. Berneice Heinrich. Pull extra staple loads.   TONSILLECTOMY     TOTAL HIP ARTHROPLASTY Left 10/17/2023   Procedure: LEFT TOTAL HIP ARTHROPLASTY;  Surgeon: Joen Laura, MD;  Location: [facility] OR;  Service: Orthopedics;  Laterality: Left;   HPI:  Patient is a 72 y.o. male with PMH: GERD, anxiety, COPD, depression, emphysema of lung, essential tremor, CAD, CKD, MI, dysphagia. He presented to the hospital on 10/17/2023 after becoming dizzy and falling, resulting in left hip pain. In ED, x-rays showed left hip fracture. He underwent left total hip arthroplasty on 10/17/23. He has been  having swallowing difficulties, however this has been ongoing and he is followed by GI. He was actually scheduled for an EGD with dilation on 10/31/23 but his wife called to cancel it as she was concerned about him getting anasthesia for 11/25 surgery and then more for 12/9 EGD. 2022 Modified barium swallow study showed minimal pharyngeal phase dysphagia, primary esophageal dysphagia with cricopharyngeal prominence noted.    Assessment / Plan / Recommendation  Clinical Impression  Patient presents with clinical s/s of what appears to be a primary esophageal dyspahgia but cannot r/o pharyngeal phase dysphagia as well. He  and wife both state that he has not had any significant changes in his swallow function since prior to this admission. Prior to SLP entering room, patient was able to expectorate some thick yellowish phlegm and his wife stated that was the first time he was able to do that since being admitted. SLP assessed patient's toleration of thin liquids (water) and nectar thick liquids. He had immediate cough and throat clear and fairly immediate belching with water and with nectar thick liquids, throat clearing was slightly more delayed but belching occured as it had with water. Patient did say that nectar thick liquids "was more smooth" but "it still burns". SLP suspects that patient's dysphagia is primarily esophageal and is recommending f/u by GI as based on patient and wife's report, his dysphagia seems to be quite debilitating to him. Wife stated that patient eats/drinks the equivalent of a few tablespoons of PO's a day but that somehow he is maintaining his weight. As this does not appear to be an acute change, SLP recommending patient chose what PO's he feels he can tolerate and SLP will follow to determine if modified barium swallow study is warranted. SLP Visit Diagnosis: Dysphagia, unspecified (R13.10)    Aspiration Risk  Mild aspiration risk;Moderate aspiration risk    Diet Recommendation Other (Comment) (PO's as tolerated)    Liquid Administration via: Straw;Cup Medication Administration: Other (Comment) (as tolerated) Supervision: Patient able to self feed Compensations: Slow rate;Small sips/bites Postural Changes: Seated upright at 90 degrees;Remain upright for at least 30 minutes after po intake    Other  Recommendations Oral Care Recommendations: Oral care BID    Recommendations for follow up therapy are one component of a multi-disciplinary discharge planning process, led by the attending physician.  Recommendations may be updated based on patient status, additional functional criteria and  insurance authorization.  Follow up Recommendations Other (comment) (TBD)      Assistance Recommended at Discharge    Functional Status Assessment Patient has not had a recent decline in their functional status  Frequency and Duration min 1 x/week  1 week       Prognosis Prognosis for improved oropharyngeal function: Fair Barriers to Reach Goals: Severity of deficits;Time post onset      Swallow Study   General Date of Onset: 10/19/23 HPI: Patient is a 72 y.o. male with PMH: GERD, anxiety, COPD, depression, emphysema of lung, essential tremor, CAD, CKD, MI, dysphagia. He presented to the hospital on 10/17/2023 after becoming dizzy and falling, resulting in left hip pain. In ED, x-rays showed left hip fracture. He underwent left total hip arthroplasty on 10/17/23. He has been having swallowing difficulties, however this has been ongoing and he is followed by GI. He was actually scheduled for an EGD with dilation on 10/31/23 but his wife called to cancel it as she was concerned about him getting anasthesia for 11/25 surgery and then more for 12/9  EGD. 2022 Modified barium swallow study showed minimal pharyngeal phase dysphagia, primary esophageal dysphagia with cricopharyngeal prominence noted. Type of Study: Bedside Swallow Evaluation Previous Swallow Assessment: 2022 MBS Diet Prior to this Study: Regular;Thin liquids (Level 0) Temperature Spikes Noted: No Respiratory Status: Nasal cannula History of Recent Intubation: Yes Total duration of intubation (days):  (for surgery only) Date extubated: 10/17/23 Behavior/Cognition: Alert;Cooperative;Pleasant mood Oral Cavity Assessment: Within Functional Limits Oral Care Completed by SLP: No Oral Cavity - Dentition: Adequate natural dentition Vision: Functional for self-feeding Self-Feeding Abilities: Able to feed self Patient Positioning: Upright in bed Baseline Vocal Quality: Normal Volitional Cough: Strong Volitional Swallow: Able to  elicit    Oral/Motor/Sensory Function Overall Oral Motor/Sensory Function: Within functional limits   Ice Chips     Thin Liquid Thin Liquid: Impaired Presentation: Straw;Self Fed Pharyngeal  Phase Impairments: Cough - Immediate;Throat Clearing - Immediate    Nectar Thick Nectar Thick Liquid: Impaired Presentation: Straw;Self Fed Pharyngeal Phase Impairments: Throat Clearing - Immediate;Throat Clearing - Delayed   Honey Thick Honey Thick Liquid: Not tested   Puree Puree: Not tested   Solid     Solid: Not tested     Angela Nevin, MA, CCC-SLP Speech Therapy

## 2023-10-19 NOTE — Plan of Care (Signed)
Problem: Education: Goal: Knowledge of General Education information will improve Description: Including pain rating scale, medication(s)/side effects and non-pharmacologic comfort measures Outcome: Progressing   Problem: Clinical Measurements: Goal: Diagnostic test results will improve Outcome: Progressing   Problem: Activity: Goal: Risk for activity intolerance will decrease Outcome: Progressing

## 2023-10-19 NOTE — Progress Notes (Signed)
RE:  Brian Graham       Date of Birth:  11/28/1950     Date:   10/19/23       To Whom It May Concern:  Please be advised that the above-named patient will require a short-term nursing home stay - anticipated 30 days or less for rehabilitation and strengthening.  The plan is for return home.                 MD signature                Date

## 2023-10-19 NOTE — Telephone Encounter (Signed)
Left message for pt wife to call back : Procedure canceled  Routed as [facility]

## 2023-10-19 NOTE — Progress Notes (Signed)
Physical Therapy Treatment Patient Details Name: Orvie Goldenstein MRN: [id number] DOB: 1951/05/01 Today's Date: 10/19/2023   History of Present Illness Patient is a 72 years old male with past medical history of coronary artery disease status post bypass, CKD, history of nephrectomy, COPD, GERD, hypertension, hyperlipidemia, hypothyroidism, presented hospital after sustaining a fall.  Patient stated that he was dizzy when he got up last night to use the bathroom and he sat back down until it passed then went in but got dizzy again and fell.  Patient did not lose consciousness.  He had immediate left hip pain and could not get up. Pt found to have L hip fx. Pt is now s/p L THA on 11/25.    PT Comments  Pt in bed upon arrival and agreeable to PT session. Worked on transfers and gait training in today's session. Pt was able to stand with MinAx2 and ambulate ~12 feet with CGA and +2 for safety and line management. Pt required multiple standing rest breaks due to feeling SOB. Pt tends to get anxious with mobility and does well with having a portable fan close to his face. Increased time in between movements. Pt is progressing well towards goals. Acute PT to follow.     10/19/23 1613  Orthostatic Lying   BP- Lying 129/63  Pulse- Lying 68  Orthostatic Sitting  BP- Sitting 128/66  Pulse- Sitting 75  Orthostatic Standing at 0 minutes  BP- Standing at 0 minutes 120/68      If plan is discharge home, recommend the following: Assistance with cooking/housework;Direct supervision/assist for medications management;Assist for transportation;Help with stairs or ramp for entrance;A little help with walking and/or transfers;A little help with bathing/dressing/bathroom   Can travel by private vehicle     Yes  Equipment Recommendations  Other (comment) (Per accepting facility)       Precautions / Restrictions Precautions Precautions: Fall Restrictions Weight Bearing Restrictions: Yes LLE Weight  Bearing: Weight bearing as tolerated     Mobility  Bed Mobility Overal bed mobility: Needs Assistance Bed Mobility: Supine to Sit, Sit to Supine     Supine to sit: Mod assist Sit to supine: Mod assist, +2 for physical assistance   General bed mobility comments: ModA to complete moving LE's off EOB and for trunk elevation. Pt became nervous and was gripping the rail and resisting sitting upright. ModAx2 to return to supine due to fear with movement. Pt preferred to swing LE's onto bed and then lay down    Transfers Overall transfer level: Needs assistance Equipment used: Rolling walker (2 wheels) Transfers: Sit to/from Stand Sit to Stand: +2 physical assistance, Min assist    General transfer comment: MinAx2 for boost up. Cues for hand placement    Ambulation/Gait Ambulation/Gait assistance: Contact guard assist, +2 safety/equipment Gait Distance (Feet): 12 Feet Assistive device: Rolling walker (2 wheels) Gait Pattern/deviations: Step-through pattern, Decreased stance time - left, Shuffle Gait velocity: dec     General Gait Details: decreased WB on L LE in stance phase, short steps with frequent rest breaks to "catch breath". Pt likes to have portable fan blowing in front of his face to reduce anxiety.        Balance Overall balance assessment: Needs assistance Sitting-balance support: Bilateral upper extremity supported, Feet supported Sitting balance-Leahy Scale: Fair     Standing balance support: Bilateral upper extremity supported, During functional activity Standing balance-Leahy Scale: Poor Standing balance comment: Reliant on RW     Cognition Arousal: Alert Behavior During Therapy:  Anxious Overall Cognitive Status: Within Functional Limits for tasks assessed    General Comments: pt has high fear of falling, reports having high anxiety before hospital admission w/ panic attacks           General Comments General comments (skin integrity, edema, etc.):  see BP readings, after ambulation SpO2 89 ON 2L, with deep breathing increased to 94%      Pertinent Vitals/Pain Pain Assessment Pain Assessment: Faces Faces Pain Scale: Hurts little more Pain Location: L hip Pain Descriptors / Indicators: Constant, Discomfort, Grimacing Pain Intervention(s): Limited activity within patient's tolerance, Monitored during session, Repositioned     PT Goals (current goals can now be found in the care plan section) Acute Rehab PT Goals Patient Stated Goal: to go home Progress towards PT goals: Progressing toward goals    Frequency    Min 1X/week       AM-PAC PT "6 Clicks" Mobility   Outcome Measure  Help needed turning from your back to your side while in a flat bed without using bedrails?: A Little Help needed moving from lying on your back to sitting on the side of a flat bed without using bedrails?: A Lot Help needed moving to and from a bed to a chair (including a wheelchair)?: A Lot Help needed standing up from a chair using your arms (e.g., wheelchair or bedside chair)?: A Lot Help needed to walk in hospital room?: A Little Help needed climbing 3-5 steps with a railing? : Total 6 Click Score: 13    End of Session Equipment Utilized During Treatment: Gait belt;Oxygen Activity Tolerance: Patient tolerated treatment well Patient left: in bed;with call bell/phone within reach;with bed alarm set;with family/visitor present Nurse Communication: Mobility status (pt reports having difficulty breathing) PT Visit Diagnosis: Other abnormalities of gait and mobility (R26.89)     Time: 1027-2536 PT Time Calculation (min) (ACUTE ONLY): 38 min  Charges:    $Gait Training: 8-22 mins $Therapeutic Activity: 23-37 mins PT General Charges $$ ACUTE PT VISIT: 1 Visit                     Hilton Cork, PT, DPT Secure Chat Preferred  Rehab Office [phone]   Arturo Morton Brion [facility] 10/19/2023, 4:09 PM

## 2023-10-19 NOTE — Care Management Important Message (Signed)
Important Message  Patient Details  Name: Brian Graham MRN: [id number] Date of Birth: 05-16-1951   Important Message Given:  Yes - Medicare IM     Sherilyn Banker 10/19/2023, 1:51 PM

## 2023-10-19 NOTE — Progress Notes (Signed)
[facility]  PROGRESS NOTE  Brian Graham WUJ:[id number] DOB: January 18, 1951 DOA: 10/17/2023 PCP: Shelva Majestic, MD   Brief HPI:   72 years old male with past medical history of coronary artery disease status post bypass, CKD, history of nephrectomy, COPD, GERD, hypertension, hyperlipidemia, hypothyroidism, presented hospital after sustaining a fall.  Patient stated that he was dizzy when he got up last night to use the bathroom and he sat back down until it passed then went in but got dizzy again and fell.  Patient did not lose consciousness.  He had immediate left hip pain and could not get up.  Hip x-ray of the left hip showed acute femoral neck fracture.  Orthopedic surgery was consulted  Hospital stay complicated by worsening CR and urinary retention.    Assessment/Plan:   Left femoral neck fracture status post fall.   -Dizziness prior to fall, no loss of consciousness -Orthopedics:  left total hip arthroplasty -DVT prophylaxis with Lovenox for 4 weeks as per orthopedics  Dizziness/fall.   Continue IV fluids.  Hold antihypertensives.     Acute kidney injury on  CKD stage IV, history of nephrectomy. -Baseline creatinine around 3.5 -Presented with creatinine of 5.0 - nephrology consulted for further recommendations -STRICT I/Os   Hyperkalemia -Lokelma 5 g p.o. x 1 -Follow BMP in am  Urinary retention -Was unable to urinate, bladder scan showed greater than 400 mL of urine -In-N-Out cath performed, 800 cc of urine obtained -PRN bladder scan  History of severe COPD.   On bronchodilators will continue  -per ER record- on 2L O2 at home   Hyperlipidemia Continue Crestor.   Hypothyroidism Continue Synthroid  GERD -pepcid   Medications     Chlorhexidine Gluconate Cloth  6 each Topical Q0600   docusate sodium  100 mg Oral BID   enoxaparin  30 mg Subcutaneous Q24H   levothyroxine  88 mcg Oral QAC breakfast   mirtazapine  15 mg Oral QHS    mometasone-formoterol  2 puff Inhalation BID   mupirocin ointment  1 Application Nasal BID   rosuvastatin  40 mg Oral Daily   umeclidinium bromide  1 puff Inhalation Daily     Data Reviewed:   CBG:  No results for input(s): "GLUCAP" in the last 168 hours.  SpO2: 99 % O2 Flow Rate (L/min): 2 L/min FiO2 (%): 28 %    Vitals:   10/19/23 0524 10/19/23 0729 10/19/23 0818 10/19/23 0900  BP: (!) 141/65 (!) 143/60    Pulse: 64 69 68   Resp: 17 18 16    Temp: 98.5 F (36.9 C) 97.8 F (36.6 C)    TempSrc: Oral Oral    SpO2: 99% 99%    Weight:    70.2 kg  Height:          Data Reviewed:  Basic Metabolic Panel: Recent Labs  Lab 10/17/23 0843 10/17/23 0859 10/18/23 0605 10/19/23 0608  NA 137 138 136 136  K 4.2 4.2 5.4* 5.0  CL 101 101 101 97*  CO2 26  --  24 23  GLUCOSE 86 84 132* 127*  BUN 36* 40* 49* 65*  CREATININE 5.05* 5.00* 5.16* 6.71*  CALCIUM 8.1*  --  7.8* 8.0*  MG  --   --  2.4  --   PHOS  --   --   --  7.5*    CBC: Recent Labs  Lab 10/17/23 0843 10/17/23 0859 10/18/23 0605 10/19/23 0608  WBC 6.5  --  8.4 8.5  HGB 10.3* 11.2* 8.9* 8.1*  HCT 34.5* 33.0* 29.2* 26.9*  MCV 101.5*  --  102.1* 99.6  PLT 217  --  215 216    LFT Recent Labs  Lab 10/17/23 0843 10/19/23 0608  AST 21  --   ALT 12  --   ALKPHOS 63  --   BILITOT 0.8  --   PROT 6.9  --   ALBUMIN 2.6* 2.3*     Antibiotics: Anti-infectives (From admission, onward)    Start     Dose/Rate Route Frequency Ordered Stop   10/18/23 0200  vancomycin (VANCOCIN) IVPB 1000 mg/200 mL premix        1,000 mg 200 mL/hr over 60 Minutes Intravenous  Once 10/17/23 1722 10/18/23 0300   10/17/23 1300  gentamicin (GARAMYCIN) 340 mg in dextrose 5 % 100 mL IVPB        5 mg/kg  68 kg 108.5 mL/hr over 60 Minutes Intravenous On call to O.R. 10/17/23 1235 10/17/23 1343   10/17/23 1300  vancomycin (VANCOCIN) IVPB 1000 mg/200 mL premix        1,000 mg 200 mL/hr over 60 Minutes Intravenous On call to O.R.  10/17/23 1235 10/17/23 1900        DVT prophylaxis: Lovenox  Code Status: Full code  Family Communication: No family at bedside   CONSULTS  Nephrology   Subjective   No overnight events   Objective    Physical Examination:    General: Appearance:    Well developed, well nourished male in no acute distress     Lungs:     respirations unlabored  Heart:    Normal heart rate.    MS:   All extremities are intact.   Neurologic:   Awake, alert     Status is: Inpatient:       Brian Graham   [facility] If 7PM-7AM, please contact night-coverage at [email or web address], Office  [phone]   10/19/2023, 10:51 AM  LOS: 2 days

## 2023-10-19 NOTE — Progress Notes (Signed)
[facility] Progress Note  Name: Brian Graham MRN: [id number] DOB: Nov 02, 1951  Chief Complaint:  fall at home with fracture  Subjective:  He had 900 mL urine over 11/26.  He had 830 mL urine retained and required a straight cath.  Unfortunately essentially no urine output charted after that.  Bladder scan charted last night as "unable to obtain".  He had normal saline at 75 ml/hr x 12 hours.  Renal US was obtained without hydro.  He doesn't think that he would want dialysis if his renal function worsened - before he hadn't planned on pursuing dialysis if his CKD had progressed.  He and his wife have always attended appointments together.  We discussed this is AKI but that he would have a higher chance of needing to stay on dialysis if he started dialysis.    Review of systems:  Reports COPD and chronic cough.  States respiratory status is near baseline - maybe a little worse  Denies n/v  --------------- Background on consult:  Brian Graham is a 72 y.o. 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  He felt dizzy before the fall.  He didn't pass out per his wife.  He was found to have an acute left femoral neck fracture.  He underwent left total hip arthroplasty on 11/25 with orthopedics.  Nephrology is consulted for assistance with management of AKI on CKD.  He follows with Dr. Allena Katz in our office at [facility]. Earlier this afternoon, he had in/out cath with 800 mL at about 1:30pm - he had no urge to void.  He has previously required a foley.  He is ok with getting again if needed.  He manages his meds.  He states that he has an appointment to see Dr. Allena Katz around the second week of December.  He reports that he is not on oxygen at home though he does have it available; hasn't needed but has had it PRN since an episode of covid.  He denies any NSAID use.       Intake/Output Summary (Last 24 hours) at 10/19/2023 1106 Last data filed at 10/19/2023 0500 Gross per 24 hour  Intake 269.61 ml  Output 965 ml  Net -695.39 ml    Vitals:  Vitals:   10/19/23 0524 10/19/23 0729 10/19/23 0818 10/19/23 0900  BP: (!) 141/65 (!) 143/60    Pulse: 64 69 68   Resp: 17 18 16    Temp: 98.5 F (36.9 C) 97.8 F (36.6 C)    TempSrc: Oral Oral    SpO2: 99% 99%    Weight:    70.2 kg  Height:         Physical Exam:  General: elderly male in bed in NAD  HEENT: NCAT Eyes: EOMI sclera anicteric Neck: supple trachea midline  Heart: S1S2 no rub Lungs: clear but reduced; normal work of breathing at rest however occasional pursed lips breathing which he shares is "habit"; frequent cough on 2 liters oxygen Abdomen: soft/nt/nd Extremities: no edema; no cyanosis or clubbing Skin: no rash on extremities exposed Neuro: alert and oriented x 3 provides hx and follows commands  Psych normal mood and affect  Medications reviewed   Labs:     Latest Ref Rng & Units 10/19/2023    6:08 AM 10/18/2023    6:05 AM 10/17/2023    8:59 AM  BMP  Glucose 70 -  99 mg/dL 829  562  84   BUN 8 - 23 mg/dL 65  49  40   Creatinine 0.61 - 1.24 mg/dL 1.30  8.65  7.84   Sodium 135 - 145 mmol/L 136  136  138   Potassium 3.5 - 5.1 mmol/L 5.0  5.4  4.2   Chloride 98 - 111 mmol/L 97  101  101   CO2 22 - 32 mmol/L 23  24    Calcium 8.9 - 10.3 mg/dL 8.0  7.8       Assessment/Plan:   # AKI  - Multifactorial.  Urinary retention noted today.  Given his history he may also have pre-renal as well as ischemic insults, too - he had perioperative hypotension noted on 11/25.  Note dizziness preceded his fall.  Renal US with left nephrectomy and negative for right hydro but unable to obtain bladder scan on last attempt per charting  - Worsening  - Avoid hypotension  - Check orthostatics to guide fluids - he has COPD and a chronic cough but has oligoanuric AKI which may be worsening   - BMP in AM   - Continue foley catheter. Start flomax  - Please avoid NSAID's   # CKD stage IV - baseline Cr 3.55 on labs this summer at [facility] - he is also s/p left nephrectomy so has decreased renal reserve - Follows with [facility] - Dr. Allena Katz   # HTN - hx of such  - Note hypotension on 11/25    # Hyperkalemia - Changed to renal diet - s/p lokelma once  - improved    # Acute left femoral neck fracture   - s/p left total hip arthroplasty on 11/25 with orthopedics. - Please avoid NSAID's   # Acute urinary retention - Start flomax  - Continue foley catheter   # Anemia of acute blood loss - suspect some surgical losses.  Also in setting of CKD - CBC in AM - add on iron panel - anticipate will need ESA     Disposition- would continue inpatient monitoring   Estanislado Emms, MD 10/19/2023 11:52 AM

## 2023-10-19 NOTE — Telephone Encounter (Signed)
Inbound call from patients wife stating that she needs to cancel patients procedure that is scheduled for 12/9 at [facility] due to patient falling and breaking his hip. Please advise.

## 2023-10-19 NOTE — Progress Notes (Signed)
    2 Days Post-Op Procedure(s) (LRB): LEFT TOTAL HIP ARTHROPLASTY (Left)  Subjective: Patient reports pain as mild.  Lying comfortably in bed this morning.  Able to stand with PT yesterday, appears motivated for PT today.  Denies distal n/t.  No new concerns.  Objective:   VITALS:   Vitals:   10/18/23 1956 10/18/23 2013 10/19/23 0524 10/19/23 0729  BP: 137/63  (!) 141/65 (!) 143/60  Pulse: 67  64 69  Resp: 18  17 18   Temp: 98.2 F (36.8 C)  98.5 F (36.9 C) 97.8 F (36.6 C)  TempSrc: Oral  Oral Oral  SpO2: 97% 96% 99% 99%  Weight:      Height:        AAOx4, resting comfortably Sensation intact distally Intact pulses distally Dorsiflexion/Plantar flexion intact Incision: dressing C/D/I No cellulitis present Compartment soft Wiggles toes appropriately   Lab Results  Component Value Date   WBC 8.5 10/19/2023   HGB 8.1 (L) 10/19/2023   HCT 26.9 (L) 10/19/2023   MCV 99.6 10/19/2023   PLT 216 10/19/2023   BMET    Component Value Date/Time   NA 136 10/18/2023 0605   NA 141 01/13/2022 0000   K 5.4 (H) 10/18/2023 0605   CL 101 10/18/2023 0605   CO2 24 10/18/2023 0605   GLUCOSE 132 (H) 10/18/2023 0605   BUN 49 (H) 10/18/2023 0605   BUN 32 (A) 01/13/2022 0000   CREATININE 5.16 (H) 10/18/2023 0605   CREATININE 2.63 (H) 01/24/2020 1057   CALCIUM 7.8 (L) 10/18/2023 0605   EGFR 22.0 06/27/2023 1147   GFRNONAA 11 (L) 10/18/2023 0605   GFRNONAA 24 (L) 01/24/2020 1057    Xray: THA components in good position no adverse features  Assessment/Plan: 2 Days Post-Op   Principal Problem:   Hip fracture (HCC) Active Problems:   Hx of CABG   HTN (hypertension)   Hyperlipidemia   Hypothyroidism   MGUS (monoclonal gammopathy of unknown significance)   Chronic kidney disease  S/p L THA for FN Fx 10/17/23  11/27: Hgb 8.1 today, generally stable.  Continue with PT.  Advance diet as tolerated.  Post op recs: WB: WBAT LLE, No formal hip precautions Abx: Vanco and  gentamicin Imaging: PACU pelvis Xray Dressing: Aquacell, keep intact until follow up DVT prophylaxis: Lovenox 40 mg daily x 4 weeks starting POD1 DC: per medicine Follow up: 2 weeks after surgery for a wound check with Dr. Blanchie Dessert at [facility].  Address: [address], [place], [place] [address]  Office Phone: [phone]   Cecil Cobbs 10/19/2023, 7:34 AM   Contact information:   Weekdays 7am-5pm epic message Dr. Blanchie Dessert, or call office for patient follow up: [phone] After hours and holidays please check [email or web address] for group call information for [facility]

## 2023-10-19 NOTE — Telephone Encounter (Signed)
OK - tell them to call us back when he is able to do it

## 2023-10-19 NOTE — TOC Initial Note (Signed)
Transition of Care [facility]) - Initial/Assessment Note    Patient Details  Name: Brian Graham MRN: [id number] Date of Birth: 1951-06-03  Transition of Care [facility]) CM/SW Contact:    Lorri Frederick, LCSW Phone Number: 10/19/2023, 12:35 PM  Clinical Narrative:    CSW spoke with pt and wife Claris Gower regarding PT recommendations for SNF.  They are agreeable to SNF, medicare choice document provided, permission given to send out referral in hub.  Pt from home with wife, no current services.  Additional info needed for passr.  Not sent out in hub, pt DC date unclear at this time.                Expected Discharge Plan: Skilled Nursing Facility Barriers to Discharge: Continued Medical Work up, SNF Pending bed offer   Patient Goals and CMS Choice Patient states their goals for this hospitalization and ongoing recovery are:: back to normal CMS Medicare.gov Compare Post Acute Care list provided to:: Patient Represenative (must comment) (wife Claris Gower) Choice offered to / list presented to : Spouse      Expected Discharge Plan and Services In-house Referral: Clinical Social Work   Post Acute Care Choice: Skilled Nursing Facility Living arrangements for the past 2 months: Single Family Home                                      Prior Living Arrangements/Services Living arrangements for the past 2 months: Single Family Home Lives with:: Spouse Patient language and need for interpreter reviewed:: Yes Do you feel safe going back to the place where you live?: Yes      Need for Family Participation in Patient Care: Yes (Comment) Care giver support system in place?: Yes (comment) Current home services: Other (comment) (none) Criminal Activity/Legal Involvement Pertinent to Current Situation/Hospitalization: No - Comment as needed  Activities of Daily Living   ADL Screening (condition at time of admission) Independently performs ADLs?: No Does the patient have a NEW  difficulty with bathing/dressing/toileting/self-feeding that is expected to last >3 days?: Yes (Initiates electronic notice to provider for possible OT consult) (broke left hip) Does the patient have a NEW difficulty with getting in/out of bed, walking, or climbing stairs that is expected to last >3 days?: Yes (Initiates electronic notice to provider for possible PT consult) (broke left hip) Does the patient have a NEW difficulty with communication that is expected to last >3 days?: No Is the patient deaf or have difficulty hearing?: Yes (can only hear out of right ear) Does the patient have difficulty seeing, even when wearing glasses/contacts?: No Does the patient have difficulty concentrating, remembering, or making decisions?: No  Permission Sought/Granted Permission sought to share information with : Family Supports Permission granted to share information with : Yes, Verbal Permission Granted  Share Information with NAME: wife Claris Gower  Permission granted to share info w AGENCY: SNF        Emotional Assessment Appearance:: Appears stated age Attitude/Demeanor/Rapport: Engaged Affect (typically observed): Appropriate, Pleasant Orientation: : Oriented to Self, Oriented to Place, Oriented to  Time, Oriented to Situation      Admission diagnosis:  Hip fracture (HCC) [S72.009A] Patient Active Problem List   Diagnosis Date Noted   Hip fracture (HCC) 10/17/2023   Senile purpura (HCC) 05/03/2022   Vitamin D deficiency 01/20/2021   Vitamin B12 deficiency 01/20/2021   Hearing abnormally acute, left 04/03/2020   Healthcare maintenance  04/03/2020   Chronic kidney disease 08/13/2019   S/p nephrectomy 12/05/2018   MGUS (monoclonal gammopathy of unknown significance) 08/02/2018   Osteoporosis 06/26/2018   Perennial allergic rhinitis 01/24/2018   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   Constipation 09/03/2017   Anxiety 07/24/2017   Atherosclerosis of native arteries of extremity with  intermittent claudication (HCC) 07/06/2017   Pyelonephritis 04/23/2017   Weight loss 04/23/2017   History of adenomatous polyp of colon 04/22/2017   Aortic atherosclerosis (HCC) 04/13/2017   Hyperlipidemia 04/04/2017   Hypothyroidism 04/04/2017   Seasonal allergies 04/04/2017   Myalgia 10/04/2016   Former smoker 09/01/2016   COPD, 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   Dysuria 08/23/2016   Chronic pain of both knees 08/23/2016   Hx of CABG 08/05/2016   CAD (coronary artery disease) of artery bypass graft 08/05/2016   HTN (hypertension) 08/05/2016   Essential tremor 06/11/2016   Tension headache 06/11/2016   PCP:  Shelva Majestic, MD Pharmacy:   [facility] [phone] - [address] [place] - [address] [place] [address] Phone: [phone] Fax: [phone]  [facility] - [place] - [address] [place] [address] Phone: [phone] Fax: [phone]  [facility] - [place], [place] - [address] [place] [address] Phone: [phone] Fax: [phone]     Social Determinants of Health (SDOH) Social History: SDOH Screenings   Food Insecurity: No Food Insecurity (10/17/2023)  Housing: Low Risk  (10/17/2023)  Transportation Needs: No Transportation Needs (10/17/2023)  Utilities: Not At Risk (10/17/2023)  Depression (PHQ2-9): Low Risk  (05/09/2023)  Financial Resource Strain: Low Risk  (04/12/2022)  Physical Activity: Inactive (04/12/2022)  Social Connections: Moderately Isolated (04/12/2022)  Stress: No Stress Concern Present (04/12/2022)  Tobacco Use: Medium Risk (10/17/2023)   SDOH Interventions:     Readmission Risk Interventions     No data to display

## 2023-10-20 DIAGNOSIS — W19XXXA Unspecified fall, initial encounter: Secondary | ICD-10-CM

## 2023-10-20 DIAGNOSIS — S72002A Fracture of unspecified part of neck of left femur, initial encounter for closed fracture: Secondary | ICD-10-CM | POA: Diagnosis not present

## 2023-10-20 DIAGNOSIS — N184 Chronic kidney disease, stage 4 (severe): Secondary | ICD-10-CM | POA: Diagnosis not present

## 2023-10-20 LAB — URINALYSIS, MICROSCOPIC (REFLEX)

## 2023-10-20 LAB — BASIC METABOLIC PANEL
Anion gap: 8 (ref 5–15)
Anion gap: 14 (ref 5–15)
BUN: 71 mg/dL — ABNORMAL HIGH (ref 8–23)
BUN: 75 mg/dL — ABNORMAL HIGH (ref 8–23)
CO2: 22 mmol/L (ref 22–32)
CO2: 19 mmol/L — ABNORMAL LOW (ref 22–32)
Calcium: 7.8 mg/dL — ABNORMAL LOW (ref 8.9–10.3)
Calcium: 7.9 mg/dL — ABNORMAL LOW (ref 8.9–10.3)
Chloride: 102 mmol/L (ref 98–111)
Chloride: 101 mmol/L (ref 98–111)
Creatinine, Ser: 8.05 mg/dL — ABNORMAL HIGH (ref 0.61–1.24)
Creatinine, Ser: 8.34 mg/dL — ABNORMAL HIGH (ref 0.61–1.24)
GFR, Estimated: 7 mL/min — ABNORMAL LOW (ref >60)
GFR, Estimated: 6 mL/min — ABNORMAL LOW (ref >60)
Glucose, Bld: 98 mg/dL (ref 70–99)
Glucose, Bld: 77 mg/dL (ref 70–99)
Potassium: 5.7 mmol/L — ABNORMAL HIGH (ref 3.5–5.1)
Potassium: 5.1 mmol/L (ref 3.5–5.1)
Sodium: 132 mmol/L — ABNORMAL LOW (ref 135–145)
Sodium: 134 mmol/L — ABNORMAL LOW (ref 135–145)

## 2023-10-20 LAB — URINALYSIS, ROUTINE W REFLEX MICROSCOPIC
Bilirubin Urine: NEGATIVE
Glucose, UA: NEGATIVE mg/dL
Ketones, ur: NEGATIVE mg/dL
Nitrite: NEGATIVE
Protein, ur: 100 mg/dL — AB
Specific Gravity, Urine: 1.015 (ref 1.005–1.030)
pH: 6 (ref 5.0–8.0)

## 2023-10-20 LAB — CBC
HCT: 26.4 % — ABNORMAL LOW (ref 39.0–52.0)
Hemoglobin: 8.1 g/dL — ABNORMAL LOW (ref 13.0–17.0)
MCH: 30.7 pg (ref 26.0–34.0)
MCHC: 30.7 g/dL (ref 30.0–36.0)
MCV: 100 fL (ref 80.0–100.0)
Platelets: 213 10*3/uL (ref 150–400)
RBC: 2.64 MIL/uL — ABNORMAL LOW (ref 4.22–5.81)
RDW: 15.6 % — ABNORMAL HIGH (ref 11.5–15.5)
WBC: 11.9 10*3/uL — ABNORMAL HIGH (ref 4.0–10.5)
nRBC: 0.2 % (ref 0.0–0.2)

## 2023-10-20 LAB — HEPATIC FUNCTION PANEL
ALT: 27 U/L (ref 0–44)
AST: 38 U/L (ref 15–41)
Albumin: 2.3 g/dL — ABNORMAL LOW (ref 3.5–5.0)
Alkaline Phosphatase: 62 U/L (ref 38–126)
Bilirubin, Direct: <0.1 mg/dL (ref 0.0–0.2)
Total Bilirubin: 1 mg/dL (ref <1.2)
Total Protein: 6.4 g/dL — ABNORMAL LOW (ref 6.5–8.1)

## 2023-10-20 LAB — CK: Total CK: 1142 U/L — ABNORMAL HIGH (ref 49–397)

## 2023-10-20 LAB — AMMONIA: Ammonia: <10 umol/L (ref 9–35)

## 2023-10-20 MED ORDER — NYSTATIN 100000 UNIT/ML MT SUSP
5.0000 mL | Freq: Four times a day (QID) | OROMUCOSAL | Status: DC
  Administered 2023-10-20 (×2): 500000 [IU] via ORAL
  Filled 2023-10-20 (×2): qty 5

## 2023-10-20 MED ORDER — SODIUM CHLORIDE 0.9 % IV SOLN: INTRAVENOUS | Status: DC

## 2023-10-20 MED ORDER — SODIUM BICARBONATE 8.4 % IV SOLN
50.0000 meq | Freq: Once | INTRAVENOUS | Status: AC
  Administered 2023-10-20: 50 meq via INTRAVENOUS
  Filled 2023-10-20 (×2): qty 50

## 2023-10-20 MED ORDER — SODIUM CHLORIDE 0.9 % IV SOLN
100.0000 mg | Freq: Once | INTRAVENOUS | Status: AC
  Administered 2023-10-20: 100 mg via INTRAVENOUS
  Filled 2023-10-20: qty 5

## 2023-10-20 MED ORDER — SODIUM ZIRCONIUM CYCLOSILICATE 10 G PO PACK
10.0000 g | PACK | Freq: Once | ORAL | Status: AC
  Administered 2023-10-20: 10 g via ORAL
  Filled 2023-10-20: qty 1

## 2023-10-20 MED ORDER — CAMPHOR-MENTHOL 0.5-0.5 % EX LOTN: TOPICAL_LOTION | CUTANEOUS | Status: DC | PRN

## 2023-10-20 NOTE — Progress Notes (Signed)
[facility]  PROGRESS NOTE  Brian Graham WUJ:[id number] DOB: [birth year] DOA: 10/17/2023 PCP: Shelva Majestic, MD   Brief HPI:   72 years old male with past medical history of coronary artery disease status post bypass, CKD, history of nephrectomy, COPD, GERD, hypertension, hyperlipidemia, hypothyroidism, presented hospital after sustaining a fall.  Patient stated that he was dizzy when he got up last night to use the bathroom and he sat back down until it passed then went in but got dizzy again and fell.  Patient did not lose consciousness.  He had immediate left hip pain and could not get up.  Hip x-ray of the left hip showed acute femoral neck fracture.  Orthopedic surgery was consulted  Hospital stay complicated by worsening CR and urinary retention.    Assessment/Plan:   Left femoral neck fracture status post fall.   -Dizziness prior to fall, no loss of consciousness -Orthopedics:  left total hip arthroplasty -DVT prophylaxis with Lovenox for 4 weeks as per orthopedics -hold pain meds as more sleepy today  Dizziness/fall.   Continue IV fluids.  Hold antihypertensives.     Acute kidney injury on  CKD stage IV, history of nephrectomy. 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 x 1 -Follow BMP   Urinary retention -Was unable to urinate, bladder scan showed greater than 400 mL of urine -In-N-Out cath performed, 800 cc of urine obtained -foley placed  History of severe COPD.   On bronchodilators will continue   -per ER record- on 2L O2 at home  -flutter valve  Hyperlipidemia Continue Crestor.   Hypothyroidism Continue Synthroid  GERD -pepcid    Patient's cr is worsening and has expressed the desire in the past not to under go HD.  Concern that patient will continue to worsen so will transfer to progressive and get palliative care consult.  Medications     Chlorhexidine Gluconate Cloth  6 each Topical Q0600   docusate sodium  100 mg Oral BID   enoxaparin  30 mg Subcutaneous Q24H   famotidine  20 mg Oral Daily   guaiFENesin  600 mg Oral BID   levothyroxine  88 mcg Oral QAC breakfast   mirtazapine  15 mg Oral QHS   mometasone-formoterol  2 puff Inhalation BID   mupirocin ointment  1 Application Nasal BID   rosuvastatin  40 mg Oral Daily   tamsulosin  0.4 mg Oral QPC supper   umeclidinium bromide  1 puff Inhalation Daily     Data Reviewed:   CBG:  No results for input(s): "GLUCAP" in the last 168 hours.  SpO2: 100 % O2 Flow Rate (L/min): 2 L/min FiO2 (%): 28 %    Vitals:   10/19/23 1932 10/20/23 0400 10/20/23 0753 10/20/23 0837  BP: 126/61 129/66 116/79   Pulse: 68 70 70 69  Resp: 19 18 18 18   Temp: 98.8 F (37.1 C) 98.3 F (36.8 C) 98.7 F (37.1 C)   TempSrc: Oral Oral Oral   SpO2: 100% 100%  100%  Weight:      Height:  Data Reviewed:  Basic Metabolic Panel: Recent Labs  Lab 10/17/23 0843 10/17/23 0859 10/18/23 0605 10/19/23 0608 10/20/23 0521  NA 137 138 136 136 132*  K 4.2 4.2 5.4* 5.0 5.7*  CL 101 101 101 97* 102  CO2 26  --  24 23 22   GLUCOSE 86 84 132* 127* 98  BUN 36* 40* 49* 65* 71*  CREATININE 5.05* 5.00* 5.16* 6.71* 8.05*  CALCIUM 8.1*  --  7.8* 8.0* 7.8*  MG  --   --  2.4  --   --   PHOS  --   --   --  7.5*  --     CBC: Recent Labs  Lab 10/17/23 0843 10/17/23 0859 10/18/23 0605 10/19/23 0608 10/20/23 0521  WBC 6.5  --  8.4 8.5 11.9*  HGB 10.3* 11.2* 8.9* 8.1* 8.1*  HCT 34.5* 33.0* 29.2* 26.9* 26.4*  MCV 101.5*  --   102.1* 99.6 100.0  PLT 217  --  215 216 213    LFT Recent Labs  Lab 10/17/23 0843 10/19/23 0608  AST 21  --   ALT 12  --   ALKPHOS 63  --   BILITOT 0.8  --   PROT 6.9  --   ALBUMIN 2.6* 2.3*     Antibiotics: Anti-infectives (From admission, onward)    Start     Dose/Rate Route Frequency Ordered Stop   10/18/23 0200  vancomycin (VANCOCIN) IVPB 1000 mg/200 mL premix        1,000 mg 200 mL/hr over 60 Minutes Intravenous  Once 10/17/23 1722 10/18/23 0300   10/17/23 1300  gentamicin (GARAMYCIN) 340 mg in dextrose 5 % 100 mL IVPB        5 mg/kg  68 kg 108.5 mL/hr over 60 Minutes Intravenous On call to O.R. 10/17/23 1235 10/17/23 1343   10/17/23 1300  vancomycin (VANCOCIN) IVPB 1000 mg/200 mL premix        1,000 mg 200 mL/hr over 60 Minutes Intravenous On call to O.R. 10/17/23 1235 10/17/23 1900        DVT prophylaxis: Lovenox  Code Status: Full code  Family Communication: LM for wife   CONSULTS  Nephrology   Subjective   Did not make much urine overnight- seems more tired and confused today   Objective    Physical Examination:    General: Appearance:    Well developed, well nourished male in no acute distress     Lungs:     respirations unlabored  Heart:    Normal heart rate.    MS:   All extremities are intact.   Neurologic:   Awake, alert     Status is: Inpatient:       [name]   [facility] If 7PM-7AM, please contact night-coverage at [email or web address], Office  [phone]   10/20/2023, 10:31 AM  LOS: 3 days

## 2023-10-20 NOTE — Progress Notes (Signed)
Came back and spoke with patient, wife and daughter.  Family re-iterates that patient would never want HD.  He was told prior that death from renal disease is the most peaceful way to die.  Family stated he would not want CPR/intubation.  They are agreeable to trial of IVF and progressive care and pending how he does would consider comfort care.  Patient moaning but when awoken says he is not in any pain.  Marlin Canary DO

## 2023-10-20 NOTE — Progress Notes (Signed)
Patient has voided 150 mL since last night. Bladder scan shows 0 mL of urine. Patient reports low volume intake. Will continue to monitor

## 2023-10-20 NOTE — Progress Notes (Signed)
This nurse arrived to patient's room to administer routine medication.  Patient had appropriate O2 levels, but patient was very confused.  He was unable to answer any orienting questions.  Patient began to desat down into the 60s with significant audible grunting and wheezing.  Patient could not stay awake longer than 10 seconds at a time.  All other vitals stable.  MD paged and Rapid called.  This RN administered Albuterol PRN.  Rapid arrived and assessed patient.  MD concerned about patient and placed order to transfer patient to progressive unit.  Telemetry monitor was placed by this RN.  Foster MD, Nephrology, arrived to unit at the same time as rapid.  Patient stable and comfortable.  All questions answered for family.  Patient on 2L [place], call bell in reach.

## 2023-10-20 NOTE — Progress Notes (Signed)
[facility] Progress Note  Name: Brian Graham MRN: [id number] DOB: 02-23-1951  Chief Complaint:  fall at home with fracture  Subjective:  He had 150 mL urine over 11/27.  He has continued with a foley catheter.  He has shared with his family multiple times that he would never want dialysis.  (And to clarify if he started dialysis I do not feel that he would be able to come off.).  His wife has been worried about his hip and he hadn't wanted to have this evaluated before - he had several strong preferences about his healthcare.  Rapid response nurse and floor RN were at bedside.  His wife called his daughter and we all spoke on speakerphone.  His daughter echoes that he would never want dialysis.  He once "tore up a consent form" and this is when he was in better health.  He stated previously that he was "tired of being sick".  Team has just started normal saline     Review of systems:  Limited with confusion Shortness of breath which did improve somewhat after an inhaler  --------------- Background on consult:  Brian Graham is a 72 y.o. 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  He felt dizzy before the fall.  He didn't pass out per his wife.  He was found to have an acute left femoral neck fracture.  He underwent left total hip arthroplasty on 11/25 with orthopedics.  Nephrology is consulted for assistance with management of AKI on CKD.  He follows with Dr. Allena Katz in our office at [facility]. Earlier this afternoon, he had in/out cath with 800 mL at about 1:30pm - he had no urge to void.  He has previously required a foley.  He is ok with getting again if needed.  He manages his meds.  He states that he has an appointment to see Dr. Allena Katz around the second week of December.  He reports that he is not on oxygen at home though he does have it available; hasn't  needed but has had it PRN since an episode of covid.  He denies any NSAID use.      Intake/Output Summary (Last 24 hours) at 10/20/2023 1119 Last data filed at 10/20/2023 0520 Gross per 24 hour  Intake --  Output 150 ml  Net -150 ml    Vitals:  Vitals:   10/20/23 0400 10/20/23 0753 10/20/23 0837 10/20/23 1108  BP: 129/66 116/79  117/61  Pulse: 70 70 69 76  Resp: 18 18 18 17   Temp: 98.3 F (36.8 C) 98.7 F (37.1 C)    TempSrc: Oral Oral    SpO2: 100%  100% 93%  Weight:      Height:         Physical Exam:   General: elderly male in bed altered  HEENT: NCAT Eyes: EOMI sclera anicteric Neck: supple trachea midline  Heart: S1S2 no rub Lungs: tachypnea; he improves to being able to speak after a nebulizer  Abdomen: soft/nt/nd Extremities: no edema; no cyanosis or clubbing Skin: no rash on extremities exposed Neuro: altered provides his name and intermittently answers questions  GU: has foley catheter with minimal urine   Medications reviewed   Labs:     Latest Ref Rng & Units 10/20/2023    5:21 AM 10/19/2023    6:08 AM 10/18/2023    6:05 AM  BMP  Glucose 70 - 99 mg/dL 98  536  644   BUN 8 - 23 mg/dL 71  65  49   Creatinine 0.61 - 1.24 mg/dL 0.34  7.42  5.95   Sodium 135 - 145 mmol/L 132  136  136   Potassium 3.5 - 5.1 mmol/L 5.7  5.0  5.4   Chloride 98 - 111 mmol/L 102  97  101   CO2 22 - 32 mmol/L 22  23  24    Calcium 8.9 - 10.3 mg/dL 7.8  8.0  7.8      Assessment/Plan:   # AKI  - Multifactorial.  Urinary retention noted today.  Given his history he may also have pre-renal as well as ischemic insults, too - he had perioperative hypotension noted on 11/25.  Note dizziness preceded his fall.  Renal US with left nephrectomy and negative for right hydro but unable to obtain bladder scan on last attempt per charting  - Worsening.  He has indicated that he would never want dialysis and I spoke with his wife at bedside and his daughter via phone.  Would recommend  palliative care consult and would recommend a transition to comfort-focused measures    - Avoid hypotension  - Caution with fluids and oligoanuric AKI - spoke with primary team. Would discontinue if he does not tolerate from a respiratory standpoint  - Continue foley catheter.    # CKD stage IV - baseline Cr 3.55 on labs this summer at [facility] - he is also s/p left nephrectomy so has decreased renal reserve - Follows with [facility] - Dr. Allena Katz   # HTN - hx of such  - Note hypotension on 11/25    # Hyperkalemia - Changed to renal diet - s/p lokelma once  - sodium bicarbonate 1 amp IV once now  - can give lokelma if alert enough to do so     # Acute left femoral neck fracture   - s/p left total hip arthroplasty on 11/25 with orthopedics. - Please avoid NSAID's   # Acute urinary retention - on flomax  - Continue foley catheter   # Anemia of acute blood loss - suspect some surgical losses.  Also in setting of CKD - IV iron once    Disposition- would continue inpatient monitoring.  Team is transferring to progressive unit.  Would recommend palliative care    Estanislado Emms, MD 10/20/2023 11:36 AM

## 2023-10-20 NOTE — Significant Event (Signed)
Rapid Response Event Note   Reason for Call :  Oriented x 4 this am now Ox1 O2 desat   Initial Focused Assessment:  Bedside RN has increased O2 and given Albuterol treatment Patient is drowsy but easily to arouse.  He is Ox1 and can follow commands.  Good cough and deep breath. Decreased Lung sounds Warm, dry skin Mottled knees and legs Tremor and asterixis   Dr Malen Gauze at bedside having conversation with wife.  Interventions:  Replaced pulse Ox probe Repositioned Able to wean O2 back to 2L  Plan of Care:     Event Summary:   MD Notified: Dr Vann/Dr Malen Gauze Call Time:  1025 Arrival Time: 1030 End Time: 1120  Marcellina Millin, RN

## 2023-10-20 NOTE — Progress Notes (Signed)
Transfer Note:  Pt arrived to [facility] [place] from [facility] [place] at approximately 1508. Pt is alert and oriented x2, to self and place, on 2LNC (pt uses PRN at home), and denied pain. Pt is in NAD at this time, with wife and daughter at bedside.

## 2023-10-21 DIAGNOSIS — E034 Atrophy of thyroid (acquired): Secondary | ICD-10-CM | POA: Diagnosis not present

## 2023-10-21 DIAGNOSIS — N179 Acute kidney failure, unspecified: Secondary | ICD-10-CM | POA: Diagnosis not present

## 2023-10-21 DIAGNOSIS — W19XXXA Unspecified fall, initial encounter: Secondary | ICD-10-CM | POA: Diagnosis not present

## 2023-10-21 DIAGNOSIS — Z7189 Other specified counseling: Secondary | ICD-10-CM

## 2023-10-21 DIAGNOSIS — S72002A Fracture of unspecified part of neck of left femur, initial encounter for closed fracture: Secondary | ICD-10-CM | POA: Diagnosis not present

## 2023-10-21 DIAGNOSIS — Z515 Encounter for palliative care: Secondary | ICD-10-CM

## 2023-10-21 LAB — CBC
HCT: 23.8 % — ABNORMAL LOW (ref 39.0–52.0)
Hemoglobin: 7.4 g/dL — ABNORMAL LOW (ref 13.0–17.0)
MCH: 31.4 pg (ref 26.0–34.0)
MCHC: 31.1 g/dL (ref 30.0–36.0)
MCV: 100.8 fL — ABNORMAL HIGH (ref 80.0–100.0)
Platelets: 192 10*3/uL (ref 150–400)
RBC: 2.36 MIL/uL — ABNORMAL LOW (ref 4.22–5.81)
RDW: 15.9 % — ABNORMAL HIGH (ref 11.5–15.5)
WBC: 10.8 10*3/uL — ABNORMAL HIGH (ref 4.0–10.5)
nRBC: 0.2 % (ref 0.0–0.2)

## 2023-10-21 LAB — BASIC METABOLIC PANEL
Anion gap: 14 (ref 5–15)
BUN: 83 mg/dL — ABNORMAL HIGH (ref 8–23)
CO2: 20 mmol/L — ABNORMAL LOW (ref 22–32)
Calcium: 7.6 mg/dL — ABNORMAL LOW (ref 8.9–10.3)
Chloride: 103 mmol/L (ref 98–111)
Creatinine, Ser: 9.38 mg/dL — ABNORMAL HIGH (ref 0.61–1.24)
GFR, Estimated: 5 mL/min — ABNORMAL LOW (ref >60)
Glucose, Bld: 61 mg/dL — ABNORMAL LOW (ref 70–99)
Potassium: 5.2 mmol/L — ABNORMAL HIGH (ref 3.5–5.1)
Sodium: 137 mmol/L (ref 135–145)

## 2023-10-21 MED ORDER — LORAZEPAM 1 MG PO TABS: 1.0000 mg | ORAL_TABLET | ORAL | Status: DC | PRN

## 2023-10-21 MED ORDER — ALBUTEROL SULFATE (2.5 MG/3ML) 0.083% IN NEBU: 2.5000 mg | INHALATION_SOLUTION | RESPIRATORY_TRACT | Status: DC | PRN

## 2023-10-21 MED ORDER — HALOPERIDOL 0.5 MG PO TABS: 0.5000 mg | ORAL_TABLET | ORAL | Status: DC | PRN

## 2023-10-21 MED ORDER — LORAZEPAM 2 MG/ML PO CONC: 1.0000 mg | ORAL | Status: DC | PRN

## 2023-10-21 MED ORDER — OXYCODONE HCL 5 MG PO TABS: 5.0000 mg | ORAL_TABLET | ORAL | Status: DC | PRN

## 2023-10-21 MED ORDER — GLYCOPYRROLATE 1 MG PO TABS: 1.0000 mg | ORAL_TABLET | ORAL | Status: DC | PRN

## 2023-10-21 MED ORDER — SODIUM CHLORIDE 0.9% FLUSH: 3.0000 mL | INTRAVENOUS | Status: DC | PRN

## 2023-10-21 MED ORDER — HALOPERIDOL LACTATE 2 MG/ML PO CONC: 0.5000 mg | ORAL | Status: DC | PRN

## 2023-10-21 MED ORDER — ONDANSETRON HCL 4 MG/2ML IJ SOLN: 4.0000 mg | Freq: Four times a day (QID) | INTRAMUSCULAR | Status: DC | PRN

## 2023-10-21 MED ORDER — ONDANSETRON 4 MG PO TBDP: 4.0000 mg | ORAL_TABLET | Freq: Four times a day (QID) | ORAL | Status: DC | PRN

## 2023-10-21 MED ORDER — GLYCOPYRROLATE 0.2 MG/ML IJ SOLN: 0.2000 mg | INTRAMUSCULAR | Status: DC | PRN

## 2023-10-21 MED ORDER — SODIUM CHLORIDE 0.9 % IV SOLN: 250.0000 mL | INTRAVENOUS | Status: DC | PRN

## 2023-10-21 MED ORDER — LORAZEPAM 2 MG/ML IJ SOLN: 1.0000 mg | INTRAMUSCULAR | Status: DC | PRN

## 2023-10-21 MED ORDER — ACETAMINOPHEN 325 MG PO TABS: 650.0000 mg | ORAL_TABLET | Freq: Four times a day (QID) | ORAL | Status: DC | PRN

## 2023-10-21 MED ORDER — HYDROMORPHONE HCL 1 MG/ML IJ SOLN: 0.5000 mg | INTRAMUSCULAR | Status: DC | PRN

## 2023-10-21 MED ORDER — SODIUM CHLORIDE 0.9% FLUSH
3.0000 mL | Freq: Two times a day (BID) | INTRAVENOUS | Status: DC
  Administered 2023-10-21: 3 mL via INTRAVENOUS

## 2023-10-21 MED ORDER — HALOPERIDOL LACTATE 5 MG/ML IJ SOLN: 0.5000 mg | INTRAMUSCULAR | Status: DC | PRN

## 2023-10-21 MED ORDER — ACETAMINOPHEN 650 MG RE SUPP: 650.0000 mg | Freq: Four times a day (QID) | RECTAL | Status: DC | PRN

## 2023-10-21 MED ORDER — DIPHENHYDRAMINE HCL 50 MG/ML IJ SOLN: 12.5000 mg | INTRAMUSCULAR | Status: DC | PRN

## 2023-10-21 NOTE — Plan of Care (Signed)

## 2023-10-21 NOTE — Progress Notes (Signed)
PROGRESS NOTE        PATIENT DETAILS Name: Brian Graham Age: 72 y.o. Sex: male Date of Birth: [birth year] Admit Date: 10/17/2023 Admitting Physician Joycelyn Das, MD ZOX:WRUEAV, Aldine Contes, MD  Brief Summary: Patient is a 72 y.o.  male with history of CKD stage IV, s/p solitary kidney-s/p left nephrectomy, CAD s/p CABG, COPD, HLD, hypothyroidism who presented to the hospital following a mechanical fall-he was found to have left hip fracture-underwent left hip total arthroplasty-unfortunately further hospital course complicated by oliguric AKI.  Significant events: 11/25>> admit to [facility]-left hip fracture-underwent left total hip arthroplasty 11/26>> worsening renal function-nephrology consult 11/29>> oliguric-transitioning to full comfort measures.  Significant studies: 11/25>> CXR: No PNA 11/25>> x-ray left hip: Left femoral neck fracture 11/25>> CT head: No acute intracranial malady 11/25>> CT C-spine: No fracture/subluxation 11/26>> renal ultrasound: Negative for hydronephrosis-s/p left nephrectomy.  Significant microbiology data: None  Procedures: None  Consults: Nephrology Orthopedics Palliative care  Subjective: Somewhat confused-but not in any distress.  Objective: Vitals: Blood pressure (!) 88/56, pulse 70, temperature 99.3 F (37.4 C), temperature source Oral, resp. rate 20, height 5\' 9"  (1.753 m), weight 70.2 kg, SpO2 100%.   Exam: Gen Exam:Alert awake-not in any distress HEENT:atraumatic, normocephalic Chest: B/L clear to auscultation anteriorly CVS:S1S2 regular Abdomen:soft non tender, non distended Extremities:no edema Neurology: Non focal Skin: no rash  Pertinent Labs/Radiology:    Latest Ref Rng & Units 10/21/2023    4:34 AM 10/20/2023    5:21 AM 10/19/2023    6:08 AM  CBC  WBC 4.0 - 10.5 K/uL 10.8  11.9  8.5   Hemoglobin 13.0 - 17.0 g/dL 7.4  8.1  8.1   Hematocrit 39.0 - 52.0 % 23.8  26.4  26.9   Platelets  150 - 400 K/uL 192  213  216     Lab Results  Component Value Date   NA 137 10/21/2023   K 5.2 (H) 10/21/2023   CL 103 10/21/2023   CO2 20 (L) 10/21/2023      Assessment/Plan: Oliguric AKI on CKD 4 (solitary kidney-s/p left nephrectomy) AKI felt to be hemodynamically mediated-likely from hypotension/prerenal insults. Worsening renal function overnight-somewhat encephalopathic this morning. No HD per patient wishes After discussion with spouse-full comfort measures  Hyperkalemia Secondary to worsening renal function Comfort measures now in place-no plans to repeat labs.  Acute metabolic encephalopathy Somewhat confused this morning Suspect secondary to AKI Supportive care-comfort measures now in place.  Acute urinary retention Continue Foley for comfort No plans for voiding trial/Foley removal  Mechanical fall with left femoral neck fracture s/p left hip arthroplasty 11/25 Supportive care  History of dysphagia Pharyngoesophageal etiology per prior notes Followed by GI in the outpatient setting Supportive care-comfort feedings now that he is comfort care.  COPD with chronic hypoxic respiratory failure on home O2 Not in exacerbation Bronchodilators for comfort  HLD Stop statin comfort measures  Hypothyroidism Stop Synthroid Comfort measures  Goals of care/palliative care Worsening AKI-now encephalopathic-oliguric Per family-never wanted hemodialysis Discussed with spouse 11/29-transitioning to full comfort measures Family discussion in progress regarding whether to pursue home hospice versus residential hospice  BMI: Estimated body mass index is 22.85 kg/m as calculated from the following:   Height as of this encounter: 5\' 9"  (1.753 m).   Weight as of this encounter: 70.2 kg.   Code status:   Code Status:  Do not attempt resuscitation (DNR) - Comfort care   DVT Prophylaxis: Not needed-comfort measures   Family Communication: Spouse at  bedside   Disposition Plan: Status is: Inpatient Remains inpatient appropriate because: Severity of illness   Planned Discharge Destination:Hospice care   Diet: Diet Order             Diet renal with fluid restriction Fluid restriction: 1200 mL Fluid; Room service appropriate? Yes; Fluid consistency: Thin  Diet effective now                     Antimicrobial agents: Anti-infectives (From admission, onward)    Start     Dose/Rate Route Frequency Ordered Stop   10/18/23 0200  vancomycin (VANCOCIN) IVPB 1000 mg/200 mL premix        1,000 mg 200 mL/hr over 60 Minutes Intravenous  Once 10/17/23 1722 10/18/23 0300   10/17/23 1300  gentamicin (GARAMYCIN) 340 mg in dextrose 5 % 100 mL IVPB        5 mg/kg  68 kg 108.5 mL/hr over 60 Minutes Intravenous On call to O.R. 10/17/23 1235 10/17/23 1343   10/17/23 1300  vancomycin (VANCOCIN) IVPB 1000 mg/200 mL premix        1,000 mg 200 mL/hr over 60 Minutes Intravenous On call to O.R. 10/17/23 1235 10/17/23 1900        MEDICATIONS: Scheduled Meds:  mometasone-formoterol  2 puff Inhalation BID   sodium chloride flush  3 mL Intravenous Q12H   umeclidinium bromide  1 puff Inhalation Daily   Continuous Infusions:  sodium chloride     PRN Meds:.sodium chloride, acetaminophen **OR** acetaminophen, albuterol, diphenhydrAMINE, haloperidol **OR** haloperidol **OR** haloperidol lactate, HYDROmorphone (DILAUDID) injection, LORazepam **OR** LORazepam **OR** LORazepam, ondansetron **OR** ondansetron (ZOFRAN) IV, oxyCODONE, sodium chloride flush   I have personally reviewed following labs and imaging studies  LABORATORY DATA: CBC: Recent Labs  Lab 10/17/23 0843 10/17/23 0859 10/18/23 0605 10/19/23 0608 10/20/23 0521 10/21/23 0434  WBC 6.5  --  8.4 8.5 11.9* 10.8*  HGB 10.3* 11.2* 8.9* 8.1* 8.1* 7.4*  HCT 34.5* 33.0* 29.2* 26.9* 26.4* 23.8*  MCV 101.5*  --  102.1* 99.6 100.0 100.8*  PLT 217  --  215 216 213 192    Basic  Metabolic Panel: Recent Labs  Lab 10/18/23 0605 10/19/23 0608 10/20/23 0521 10/20/23 1317 10/21/23 0434  NA 136 136 132* 134* 137  K 5.4* 5.0 5.7* 5.1 5.2*  CL 101 97* 102 101 103  CO2 24 23 22  19* 20*  GLUCOSE 132* 127* 98 77 61*  BUN 49* 65* 71* 75* 83*  CREATININE 5.16* 6.71* 8.05* 8.34* 9.38*  CALCIUM 7.8* 8.0* 7.8* 7.9* 7.6*  MG 2.4  --   --   --   --   PHOS  --  7.5*  --   --   --     GFR: Estimated Creatinine Clearance: 7 mL/min (A) (by C-G formula based on SCr of 9.38 mg/dL (H)).  Liver Function Tests: Recent Labs  Lab 10/17/23 0843 10/19/23 0608 10/20/23 1059  AST 21  --  38  ALT 12  --  27  ALKPHOS 63  --  62  BILITOT 0.8  --  1.0  PROT 6.9  --  6.4*  ALBUMIN 2.6* 2.3* 2.3*   No results for input(s): "LIPASE", "AMYLASE" in the last 168 hours. Recent Labs  Lab 10/20/23 1059  AMMONIA <10    Coagulation Profile: Recent Labs  Lab 10/17/23 432 103 1841  INR 1.1    Cardiac Enzymes: Recent Labs  Lab 10/17/23 1816 10/20/23 1059  CKTOTAL 751* 1,142*    BNP (last 3 results) No results for input(s): "PROBNP" in the last 8760 hours.  Lipid Profile: No results for input(s): "CHOL", "HDL", "LDLCALC", "TRIG", "CHOLHDL", "LDLDIRECT" in the last 72 hours.  Thyroid Function Tests: No results for input(s): "TSH", "T4TOTAL", "FREET4", "T3FREE", "THYROIDAB" in the last 72 hours.  Anemia Panel: No results for input(s): "VITAMINB12", "FOLATE", "FERRITIN", "TIBC", "IRON", "RETICCTPCT" in the last 72 hours.  Urine analysis:    Component Value Date/Time   COLORURINE LESS THAN 10 mL OF URINE SUBMITTED (A) 10/20/2023 1258   APPEARANCEUR CLOUDY (A) 10/20/2023 1258   APPEARANCEUR Clear 08/23/2016 1141   LABSPEC 1.015 10/20/2023 1258   PHURINE 6.0 10/20/2023 1258   GLUCOSEU NEGATIVE 10/20/2023 1258   GLUCOSEU NEGATIVE 11/28/2018 1212   HGBUR LARGE (A) 10/20/2023 1258   BILIRUBINUR NEGATIVE 10/20/2023 1258   BILIRUBINUR negative 09/03/2017 1155   BILIRUBINUR  Negative 08/23/2016 1141   KETONESUR NEGATIVE 10/20/2023 1258   PROTEINUR 100 (A) 10/20/2023 1258   UROBILINOGEN 0.2 09/19/2019 1203   NITRITE NEGATIVE 10/20/2023 1258   LEUKOCYTESUR MODERATE (A) 10/20/2023 1258    Sepsis Labs: Lactic Acid, Venous    Component Value Date/Time   LATICACIDVEN 1.0 10/17/2023 0900    MICROBIOLOGY: Recent Results (from the past 240 hour(s))  Surgical pcr screen     Status: Abnormal   Collection Time: 10/17/23 11:43 AM   Specimen: Nasal Mucosa; Nasal Swab  Result Value Ref Range Status   MRSA, PCR NEGATIVE NEGATIVE Final   Staphylococcus aureus POSITIVE (A) NEGATIVE Final    Comment: (NOTE) The Xpert SA Assay (FDA approved for NASAL specimens in patients 67 years of age and older), is one component of a comprehensive surveillance program. It is not intended to diagnose infection nor to guide or monitor treatment. Performed at [facility], [address], [place], [place] [address]     RADIOLOGY STUDIES/RESULTS: DG CHEST PORT 1 VIEW  Result Date: 10/19/2023 CLINICAL DATA:  Dyspnea EXAM: PORTABLE CHEST 1 VIEW COMPARISON:  10/17/2023, CT 01/19/2023 FINDINGS: Hyperinflation with emphysema. No acute airspace disease, pleural effusion or pneumothorax. Normal cardiac size. IMPRESSION: No active disease. Hyperinflation with emphysema. Electronically Signed   By: Jasmine Pang M.D.   On: 10/19/2023 20:39     LOS: 4 days   Jeoffrey Massed, MD  [facility]    To contact the attending provider between 7A-7P or the covering provider during after hours 7P-7A, please log into the web site [email or web address] and access using universal [place] password for that web site. If you do not have the password, please call the hospital operator.  10/21/2023, 10:52 AM

## 2023-10-21 NOTE — Plan of Care (Signed)
  Problem: Education: Goal: Knowledge of General Education information will improve Description: Including pain rating scale, medication(s)/side effects and non-pharmacologic comfort measures Outcome: Progressing   Problem: Health Behavior/Discharge Planning: Goal: Ability to manage health-related needs will improve Outcome: Progressing   Problem: Clinical Measurements: Goal: Ability to maintain clinical measurements within normal limits will improve Outcome: Progressing Goal: Will remain free from infection Outcome: Progressing Goal: Diagnostic test results will improve Outcome: Progressing Goal: Respiratory complications will improve Outcome: Progressing Goal: Cardiovascular complication will be avoided Outcome: Progressing   Problem: Activity: Goal: Risk for activity intolerance will decrease Outcome: Progressing   Problem: Coping: Goal: Level of anxiety will decrease Outcome: Progressing   Problem: Elimination: Goal: Will not experience complications related to bowel motility Outcome: Progressing Goal: Will not experience complications related to urinary retention Outcome: Progressing   Problem: Pain Management: Goal: General experience of comfort will improve Outcome: Progressing   Problem: Safety: Goal: Ability to remain free from injury will improve Outcome: Progressing   Problem: Skin Integrity: Goal: Risk for impaired skin integrity will decrease Outcome: Progressing   Problem: Education: Goal: Knowledge of the prescribed therapeutic regimen will improve Outcome: Progressing   Problem: Coping: Goal: Ability to identify and develop effective coping behavior will improve Outcome: Progressing   Problem: Clinical Measurements: Goal: Quality of life will improve Outcome: Progressing   Problem: Respiratory: Goal: Verbalizations of increased ease of respirations will increase Outcome: Progressing   Problem: Role Relationship: Goal: Family's ability to  cope with current situation will improve Outcome: Progressing Goal: Ability to verbalize concerns, feelings, and thoughts to partner or family member will improve Outcome: Progressing   Problem: Pain Management: Goal: Satisfaction with pain management regimen will improve Outcome: Progressing

## 2023-10-21 NOTE — Progress Notes (Signed)
[facility] has arrived to take pt to hospice house. Pt in NAD at this time. Pt will be leaving with peripheral IV and Foley catheter, per provider order.

## 2023-10-21 NOTE — Discharge Summary (Signed)
PATIENT DETAILS Name: Brian Graham Age: 72 y.o. Sex: male Date of Birth: 1951/02/06 MRN: [id number]. Admitting Physician: Joycelyn Das, MD ZDG:LOVFIE, Aldine Contes, MD  Admit Date: 10/17/2023 Discharge date: 10/21/2023  Recommendations for Outpatient Follow-up:  Optimize comfort measures  Admitted From:  Home  Disposition: Hospice care   Discharge Condition: poor  CODE STATUS:   Code Status: Do not attempt resuscitation (DNR) - Comfort care   Diet recommendation:  Diet Order             Diet general           Diet renal with fluid restriction Fluid restriction: 1200 mL Fluid; Room service appropriate? Yes; Fluid consistency: Thin  Diet effective now                    Brief Summary: Patient is a 72 y.o.  male with history of CKD stage IV, s/p solitary kidney-s/p left nephrectomy, CAD s/p CABG, COPD, HLD, hypothyroidism who presented to the hospital following a mechanical fall-he was found to have left hip fracture-underwent left hip total arthroplasty-unfortunately further hospital course complicated by oliguric AKI.   Significant events: 11/25>> admit to [facility]-left hip fracture-underwent left total hip arthroplasty 11/26>> worsening renal function-nephrology consult 11/29>> oliguric-transitioning to full comfort measures.   Significant studies: 11/25>> CXR: No PNA 11/25>> x-ray left hip: Left femoral neck fracture 11/25>> CT head: No acute intracranial malady 11/25>> CT C-spine: No fracture/subluxation 11/26>> renal ultrasound: Negative for hydronephrosis-s/p left nephrectomy.   Significant microbiology data: None   Procedures: None   Consults: Nephrology Orthopedics Palliative care  Brief Hospital Course: Oliguric AKI on CKD 4 (solitary kidney-s/p left nephrectomy) AKI felt to be hemodynamically mediated-likely from hypotension/prerenal insults. Worsening renal function overnight-somewhat encephalopathic this morning. No HD per patient  wishes After discussion with spouse-full comfort measures   Hyperkalemia Secondary to worsening renal function Comfort measures now in place-no plans to repeat labs.   Acute metabolic encephalopathy Somewhat confused this morning Suspect secondary to AKI Supportive care-comfort measures now in place.   Acute urinary retention Continue Foley for comfort No plans for voiding trial/Foley removal   Mechanical fall with left femoral neck fracture s/p left hip arthroplasty 11/25 Supportive care   History of dysphagia Pharyngoesophageal etiology per prior notes Followed by GI in the outpatient setting Supportive care-comfort feedings now that he is comfort care.   COPD with chronic hypoxic respiratory failure on home O2 Not in exacerbation Bronchodilators for comfort   HLD Stop statin comfort measures   Hypothyroidism Stop Synthroid Comfort measures   Goals of care/palliative care Worsening AKI-now encephalopathic-oliguric Per family-never wanted hemodialysis Discussed with spouse 11/29-transitioning to full comfort measures Family has elected residential hospice.   BMI: Estimated body mass index is 22.85 kg/m as calculated from the following:   Height as of this encounter: 5\' 9"  (1.753 m).   Weight as of this encounter: 70.2 k  Discharge Diagnoses:  Principal Problem:   Hip fracture (HCC) Active Problems:   Hx of CABG   HTN (hypertension)   Hyperlipidemia   Hypothyroidism   MGUS (monoclonal gammopathy of unknown significance)   Chronic kidney disease   Discharge Instructions:  Activity:  As tolerated   Discharge Instructions     Diet general   Complete by: As directed    Increase activity slowly   Complete by: As directed       Allergies as of 10/21/2023       Reactions   Contrast Media [iodinated  Contrast Media] Other (See Comments)   NOT ALLOWED DUE TO KIDNEY ISSUES.   Other Itching, Rash   Gel used for ultrasound SEVERELY broke out the skin     Keflex [cephalexin] Diarrhea, Nausea Only, Other (See Comments)   Insomnia   Sulfa Antibiotics Other (See Comments)   Insomnia   Ciprofloxacin Diarrhea   Levaquin [levofloxacin] Other (See Comments)   Insomnia   Omeprazole Diarrhea        Medication List     STOP taking these medications    acetaminophen 500 MG tablet Commonly known as: TYLENOL   aspirin EC 81 MG tablet   atenolol 50 MG tablet Commonly known as: TENORMIN   desonide 0.05 % cream Commonly known as: DesOwen   levothyroxine 88 MCG tablet Commonly known as: SYNTHROID   mirtazapine 15 MG tablet Commonly known as: REMERON   rosuvastatin 40 MG tablet Commonly known as: CRESTOR   traMADol 50 MG tablet Commonly known as: ULTRAM       TAKE these medications    albuterol (2.5 MG/3ML) 0.083% nebulizer solution Commonly known as: PROVENTIL Take 3 mLs (2.5 mg total) by nebulization every 2 (two) hours as needed for wheezing. What changed:  See the new instructions. Another medication with the same name was removed. Continue taking this medication, and follow the directions you see here.   budesonide-formoterol 160-4.5 MCG/ACT inhaler Commonly known as: Breyna Inhale 2 puffs into the lungs 2 (two) times daily.   enoxaparin 40 MG/0.4ML injection Commonly known as: LOVENOX Inject 0.4 mLs (40 mg total) into the skin daily for 28 days.   HYDROmorphone 1 MG/ML injection Commonly known as: DILAUDID Inject 0.5-2 mLs (0.5-2 mg total) into the vein every 30 (thirty) minutes as needed for severe pain (pain score 7-10) (To alleviate signs and symptoms of distress).   LORazepam 1 MG tablet Commonly known as: ATIVAN Take 1 tablet (1 mg total) by mouth every 4 (four) hours as needed for anxiety.   ondansetron 4 MG disintegrating tablet Commonly known as: ZOFRAN-ODT Take 1 tablet (4 mg total) by mouth every 6 (six) hours as needed for nausea.   oxyCODONE 5 MG immediate release tablet Commonly known as:  Oxy IR/ROXICODONE Take 1 tablet (5 mg total) by mouth every 4 (four) hours as needed for moderate pain (pain score 4-6) (or dyspnea).   Spiriva Respimat 2.5 MCG/ACT Aers Generic drug: Tiotropium Bromide Monohydrate USE 2 INHALATIONS DAILY (NEED APPOINTMENT FOR FURTHER REFILLS)        Follow-up Information     Joen Laura, MD Follow up in 2 week(s).   Specialty: Orthopedic Surgery Why: As needed Contact information: [address] [place] [address] [phone]         Shelva Majestic, MD. Schedule an appointment as soon as possible for a visit.   Specialty: Family Medicine Why: As needed Contact information: [address] [place] [address] [phone]                Allergies  Allergen Reactions   Contrast Media [Iodinated Contrast Media] Other (See Comments)    NOT ALLOWED DUE TO KIDNEY ISSUES.   Other Itching and Rash    Gel used for ultrasound SEVERELY broke out the skin    Keflex [Cephalexin] Diarrhea, Nausea Only and Other (See Comments)    Insomnia   Sulfa Antibiotics Other (See Comments)    Insomnia   Ciprofloxacin Diarrhea   Levaquin [Levofloxacin] Other (See Comments)  Insomnia    Omeprazole Diarrhea     Other Procedures/Studies: DG CHEST PORT 1 VIEW  Result Date: 10/19/2023 CLINICAL DATA:  Dyspnea EXAM: PORTABLE CHEST 1 VIEW COMPARISON:  10/17/2023, CT 01/19/2023 FINDINGS: Hyperinflation with emphysema. No acute airspace disease, pleural effusion or pneumothorax. Normal cardiac size. IMPRESSION: No active disease. Hyperinflation with emphysema. Electronically Signed   By: Jasmine Pang M.D.   On: 10/19/2023 20:39   US RENAL  Result Date: 10/18/2023 CLINICAL DATA:  Acute kidney injury EXAM: RENAL / URINARY TRACT ULTRASOUND COMPLETE COMPARISON:  CT 08/30/2020 FINDINGS: Right Kidney: Renal measurements: 9.8 x 5.1 x 4.4 cm = volume: 114.6 mL. Echogenicity within normal limits. No mass or hydronephrosis  visualized. Left Kidney: Nephrectomy Bladder: Appears normal for degree of bladder distention. Other: None. IMPRESSION: Negative for right hydronephrosis. Left nephrectomy. Electronically Signed   By: Jasmine Pang M.D.   On: 10/18/2023 23:35   DG HIP PORT UNILAT WITH PELVIS 1V LEFT  Result Date: 10/17/2023 CLINICAL DATA:  Postoperative state. EXAM: DG HIP (WITH OR WITHOUT PELVIS) 1V PORT LEFT COMPARISON:  Left hip intraoperative fluoroscopy 10/17/2023, same-day FINDINGS: Interval total left hip arthroplasty. No perihardware lucency is seen to indicate hardware failure or loosening. Expected lateral left hip subcutaneous air. Mild-to-moderate superomedial right femoroacetabular joint space narrowing. Moderate atherosclerotic calcifications. IMPRESSION: Interval total left hip arthroplasty without evidence of hardware failure. Electronically Signed   By: Neita Garnet M.D.   On: 10/17/2023 17:23   DG HIP UNILAT WITH PELVIS 1V LEFT  Result Date: 10/17/2023 CLINICAL DATA:  Elective surgery.  Total left hip arthroplasty. EXAM: DG HIP (WITH OR WITHOUT PELVIS) 1V*L* COMPARISON:  Pelvis and left hip radiographs 10/17/2023 FINDINGS: Single frontal intraoperative radiograph of the bilateral femoroacetabular joints. The patient is undergoing total left hip arthroplasty with acetabular cup and left femoral stem seen. No hardware complication is seen. Expected lateral left hip intraoperative subcutaneous air. IMPRESSION: Intraoperative radiograph during total left hip arthroplasty. No hardware complication is seen. Electronically Signed   By: Neita Garnet M.D.   On: 10/17/2023 15:55   CT Cervical Spine Wo Contrast  Result Date: 10/17/2023 CLINICAL DATA:  Neck trauma (Age >= 65y); Head trauma, moderate-severe EXAM: CT HEAD WITHOUT CONTRAST CT CERVICAL SPINE WITHOUT CONTRAST TECHNIQUE: Multidetector CT imaging of the head and cervical spine was performed following the standard protocol without intravenous  contrast. Multiplanar CT image reconstructions of the cervical spine were also generated. RADIATION DOSE REDUCTION: This exam was performed according to the departmental dose-optimization program which includes automated exposure control, adjustment of the mA and/or kV according to patient size and/or use of iterative reconstruction technique. COMPARISON:  None Available. FINDINGS: CT HEAD FINDINGS Brain: No hemorrhage. No hydrocephalus. No extra-axial fluid collection. No CT evidence of an acute cortical infarct. No mass effect. No mass lesion. Vascular: No hyperdense vessel or unexpected calcification. Skull: Normal. Negative for fracture or focal lesion. Sinuses/Orbits: No middle ear or mastoid effusion. Paranasal sinuses are notable for mucosal thickening in the right sphenoid bilateral ethmoid sinuses. Orbits are unremarkable. Other: None. CT CERVICAL SPINE FINDINGS Alignment: Normal. Skull base and vertebrae: No acute fracture. No primary bone lesion or focal pathologic process. Soft tissues and spinal canal: No prevertebral fluid or swelling. No visible canal hematoma. Disc levels: There is likely moderate spinal canal narrowing at C5-C6 Upper chest: Biapical emphysema and pleuroparenchymal scarring. Other: Severe atherosclerotic calcification of the carotid bifurcation on the right IMPRESSION: 1. No acute intracranial abnormality. 2. No acute fracture or traumatic subluxation  of the cervical spine. 3. Severe atherosclerotic calcification of the carotid bifurcation on the right. Electronically Signed   By: Lorenza Cambridge M.D.   On: 10/17/2023 09:54   CT Head Wo Contrast  Result Date: 10/17/2023 CLINICAL DATA:  Neck trauma (Age >= 65y); Head trauma, moderate-severe EXAM: CT HEAD WITHOUT CONTRAST CT CERVICAL SPINE WITHOUT CONTRAST TECHNIQUE: Multidetector CT imaging of the head and cervical spine was performed following the standard protocol without intravenous contrast. Multiplanar CT image reconstructions  of the cervical spine were also generated. RADIATION DOSE REDUCTION: This exam was performed according to the departmental dose-optimization program which includes automated exposure control, adjustment of the mA and/or kV according to patient size and/or use of iterative reconstruction technique. COMPARISON:  None Available. FINDINGS: CT HEAD FINDINGS Brain: No hemorrhage. No hydrocephalus. No extra-axial fluid collection. No CT evidence of an acute cortical infarct. No mass effect. No mass lesion. Vascular: No hyperdense vessel or unexpected calcification. Skull: Normal. Negative for fracture or focal lesion. Sinuses/Orbits: No middle ear or mastoid effusion. Paranasal sinuses are notable for mucosal thickening in the right sphenoid bilateral ethmoid sinuses. Orbits are unremarkable. Other: None. CT CERVICAL SPINE FINDINGS Alignment: Normal. Skull base and vertebrae: No acute fracture. No primary bone lesion or focal pathologic process. Soft tissues and spinal canal: No prevertebral fluid or swelling. No visible canal hematoma. Disc levels: There is likely moderate spinal canal narrowing at C5-C6 Upper chest: Biapical emphysema and pleuroparenchymal scarring. Other: Severe atherosclerotic calcification of the carotid bifurcation on the right IMPRESSION: 1. No acute intracranial abnormality. 2. No acute fracture or traumatic subluxation of the cervical spine. 3. Severe atherosclerotic calcification of the carotid bifurcation on the right. Electronically Signed   By: Lorenza Cambridge M.D.   On: 10/17/2023 09:54   DG Hip Unilat W or Wo Pelvis 2-3 Views Left  Result Date: 10/17/2023 CLINICAL DATA:  72 year old male status post fall with lower extremity foreshortening. EXAM: DG HIP (WITH OR WITHOUT PELVIS) 2-3V LEFT COMPARISON:  CT Abdomen and Pelvis 08/31/2019. FINDINGS: AP portable supine views of the pelvis and left hip at 0841 hours. Left femoral neck fracture with varus impaction. The intertrochanteric segment  appears to remain intact on these images. Left femoral head appears to remain normally located. No superimposed pelvis fracture identified. Grossly intact proximal right femur. Nonobstructed bowel gas pattern. Aortoiliac calcified atherosclerosis. Left iliac artery vascular stent is chronic. IMPRESSION: Acute Left femoral neck fracture with varus impaction. No superimposed pelvis fracture identified. Electronically Signed   By: Odessa Fleming M.D.   On: 10/17/2023 09:01   DG Chest Portable 1 View  Result Date: 10/17/2023 CLINICAL DATA:  72 year old male status post fall with left hip fracture. EXAM: PORTABLE CHEST 1 VIEW COMPARISON:  Chest CT 01/19/2023 and earlier. FINDINGS: Portable AP semi upright view at 0838 hours. Chronic large lung volumes. Chronic sternotomy. Mediastinal contours remain within normal limits. Stable lung markings. No pneumothorax, pulmonary edema, acute lung opacity. Stable visualized osseous structures. IMPRESSION: COPD.  No acute cardiopulmonary abnormality. Electronically Signed   By: Odessa Fleming M.D.   On: 10/17/2023 09:00     TODAY-DAY OF DISCHARGE:  Subjective:   Brian Graham today appears stable and pleasantly confused.  Objective:   Blood pressure (!) 88/56, pulse 70, temperature 99.3 F (37.4 C), temperature source Oral, resp. rate 20, height 5\' 9"  (1.753 m), weight 70.2 kg, SpO2 100%.  Intake/Output Summary (Last 24 hours) at 10/21/2023 1144 Last data filed at 10/21/2023 0700 Gross per 24 hour  Intake 763.75 ml  Output 75 ml  Net 688.75 ml   Filed Weights   10/17/23 0845 10/17/23 1300 10/19/23 0900  Weight: 68 kg 68 kg 70.2 kg    Exam:  No new F.N deficits, Normal affect [place].AT,PERRAL Supple Neck,No JVD, No cervical lymphadenopathy appriciated.  Symmetrical Chest wall movement, Good air movement bilaterally, CTAB RRR,No Gallops,Rubs or new Murmurs, No Parasternal Heave +ve B.Sounds, Abd Soft, Non tender, No organomegaly appriciated, No rebound -guarding or  rigidity. No Cyanosis, Clubbing or edema, No new Rash or bruise   PERTINENT RADIOLOGIC STUDIES: DG CHEST PORT 1 VIEW  Result Date: 10/19/2023 CLINICAL DATA:  Dyspnea EXAM: PORTABLE CHEST 1 VIEW COMPARISON:  10/17/2023, CT 01/19/2023 FINDINGS: Hyperinflation with emphysema. No acute airspace disease, pleural effusion or pneumothorax. Normal cardiac size. IMPRESSION: No active disease. Hyperinflation with emphysema. Electronically Signed   By: Jasmine Pang M.D.   On: 10/19/2023 20:39     PERTINENT LAB RESULTS: CBC: Recent Labs    10/20/23 0521 10/21/23 0434  WBC 11.9* 10.8*  HGB 8.1* 7.4*  HCT 26.4* 23.8*  PLT 213 192   CMET CMP     Component Value Date/Time   NA 137 10/21/2023 0434   NA 141 01/13/2022 0000   K 5.2 (H) 10/21/2023 0434   CL 103 10/21/2023 0434   CO2 20 (L) 10/21/2023 0434   GLUCOSE 61 (L) 10/21/2023 0434   BUN 83 (H) 10/21/2023 0434   BUN 32 (A) 01/13/2022 0000   CREATININE 9.38 (H) 10/21/2023 0434   CREATININE 2.63 (H) 01/24/2020 1057   CALCIUM 7.6 (L) 10/21/2023 0434   PROT 6.4 (L) 10/20/2023 1059   PROT 6.7 01/13/2021 0917   ALBUMIN 2.3 (L) 10/20/2023 1059   ALBUMIN 4.2 05/06/2017 0923   AST 38 10/20/2023 1059   AST 18 01/24/2020 1057   ALT 27 10/20/2023 1059   ALT 16 01/24/2020 1057   ALKPHOS 62 10/20/2023 1059   BILITOT 1.0 10/20/2023 1059   BILITOT 0.6 01/24/2020 1057   GFR 16.40 (L) 05/09/2023 0952   EGFR 22.0 06/27/2023 1147   GFRNONAA 5 (L) 10/21/2023 0434   GFRNONAA 24 (L) 01/24/2020 1057    GFR Estimated Creatinine Clearance: 7 mL/min (A) (by C-G formula based on SCr of 9.38 mg/dL (H)). No results for input(s): "LIPASE", "AMYLASE" in the last 72 hours. Recent Labs    10/20/23 1059  CKTOTAL 1,142*   Invalid input(s): "POCBNP" No results for input(s): "DDIMER" in the last 72 hours. No results for input(s): "HGBA1C" in the last 72 hours. No results for input(s): "CHOL", "HDL", "LDLCALC", "TRIG", "CHOLHDL", "LDLDIRECT" in the last  72 hours. No results for input(s): "TSH", "T4TOTAL", "T3FREE", "THYROIDAB" in the last 72 hours.  Invalid input(s): "FREET3" No results for input(s): "VITAMINB12", "FOLATE", "FERRITIN", "TIBC", "IRON", "RETICCTPCT" in the last 72 hours. Coags: No results for input(s): "INR" in the last 72 hours.  Invalid input(s): "PT" Microbiology: Recent Results (from the past 240 hour(s))  Surgical pcr screen     Status: Abnormal   Collection Time: 10/17/23 11:43 AM   Specimen: Nasal Mucosa; Nasal Swab  Result Value Ref Range Status   MRSA, PCR NEGATIVE NEGATIVE Final   Staphylococcus aureus POSITIVE (A) NEGATIVE Final    Comment: (NOTE) The Xpert SA Assay (FDA approved for NASAL specimens in patients 72 years of age and older), is one component of a comprehensive surveillance program. It is not intended to diagnose infection nor to guide or monitor treatment. Performed at [facility], [address], [place], [place] [address]     FURTHER DISCHARGE INSTRUCTIONS:  Get Medicines reviewed and adjusted: Please take all your medications with you for your next visit with your Primary MD  Laboratory/radiological data: Please request your Primary MD to go over all hospital tests and procedure/radiological results at the follow up, please ask your Primary MD to get all Hospital records sent to his/her office.  In some cases, they will be blood work, cultures and biopsy results pending at the time of your discharge. Please request that your primary care M.D. goes through all the records of your hospital data and follows up on these results.  Also Note the following: If you experience worsening of your admission symptoms, develop shortness of breath, life threatening emergency, suicidal or homicidal thoughts you must seek medical attention immediately by calling 911 or calling your MD immediately  if symptoms less severe.  You must read complete instructions/literature along with all the  possible adverse reactions/side effects for all the Medicines you take and that have been prescribed to you. Take any new Medicines after you have completely understood and accpet all the possible adverse reactions/side effects.   Do not drive when taking Pain medications or sleeping medications (Benzodaizepines)  Do not take more than prescribed Pain, Sleep and Anxiety Medications. It is not advisable to combine anxiety,sleep and pain medications without talking with your primary care practitioner  Special Instructions: If you have smoked or chewed Tobacco  in the last 2 yrs please stop smoking, stop any regular Alcohol  and or any Recreational drug use.  Wear Seat belts while driving.  Please note: You were cared for by a hospitalist during your hospital stay. Once you are discharged, your primary care physician will handle any further medical issues. Please note that NO REFILLS for any discharge medications will be authorized once you are discharged, as it is imperative that you return to your primary care physician (or establish a relationship with a primary care physician if you do not have one) for your post hospital discharge needs so that they can reassess your need for medications and monitor your lab values.  Total Time spent coordinating discharge including counseling, education and face to face time equals greater than 30 minutes.  SignedJeoffrey Massed 10/21/2023 11:44 AM

## 2023-10-21 NOTE — Plan of Care (Signed)
Problem: Education: Goal: Knowledge of General Education information will improve Description: Including pain rating scale, medication(s)/side effects and non-pharmacologic comfort measures 10/21/2023 1708 by Rosalio Macadamia, RN Outcome: Adequate for Discharge 10/21/2023 1707 by Rosalio Macadamia, RN Outcome: Progressing   Problem: Health Behavior/Discharge Planning: Goal: Ability to manage health-related needs will improve 10/21/2023 1708 by Rosalio Macadamia, RN Outcome: Adequate for Discharge 10/21/2023 1707 by Rosalio Macadamia, RN Outcome: Progressing   Problem: Clinical Measurements: Goal: Ability to maintain clinical measurements within normal limits will improve 10/21/2023 1708 by Rosalio Macadamia, RN Outcome: Adequate for Discharge 10/21/2023 1707 by Rosalio Macadamia, RN Outcome: Progressing Goal: Will remain free from infection 10/21/2023 1708 by Rosalio Macadamia, RN Outcome: Adequate for Discharge 10/21/2023 1707 by Rosalio Macadamia, RN Outcome: Progressing Goal: Diagnostic test results will improve 10/21/2023 1708 by Rosalio Macadamia, RN Outcome: Adequate for Discharge 10/21/2023 1707 by Rosalio Macadamia, RN Outcome: Progressing Goal: Respiratory complications will improve 10/21/2023 1708 by Rosalio Macadamia, RN Outcome: Adequate for Discharge 10/21/2023 1707 by Rosalio Macadamia, RN Outcome: Progressing Goal: Cardiovascular complication will be avoided 10/21/2023 1708 by Rosalio Macadamia, RN Outcome: Adequate for Discharge 10/21/2023 1707 by Rosalio Macadamia, RN Outcome: Progressing   Problem: Activity: Goal: Risk for activity intolerance will decrease 10/21/2023 1708 by Rosalio Macadamia, RN Outcome: Adequate for Discharge 10/21/2023 1707 by Rosalio Macadamia, RN Outcome: Progressing   Problem: Nutrition: Goal: Adequate nutrition will be maintained 10/21/2023 1708 by Rosalio Macadamia, RN Outcome: Adequate for Discharge 10/21/2023 1707 by Rosalio Macadamia, RN Outcome:  Not Progressing   Problem: Coping: Goal: Level of anxiety will decrease 10/21/2023 1708 by Rosalio Macadamia, RN Outcome: Adequate for Discharge 10/21/2023 1707 by Rosalio Macadamia, RN Outcome: Progressing   Problem: Elimination: Goal: Will not experience complications related to bowel motility 10/21/2023 1708 by Rosalio Macadamia, RN Outcome: Adequate for Discharge 10/21/2023 1707 by Rosalio Macadamia, RN Outcome: Progressing Goal: Will not experience complications related to urinary retention 10/21/2023 1708 by Rosalio Macadamia, RN Outcome: Adequate for Discharge 10/21/2023 1707 by Rosalio Macadamia, RN Outcome: Progressing   Problem: Pain Management: Goal: General experience of comfort will improve 10/21/2023 1708 by Rosalio Macadamia, RN Outcome: Adequate for Discharge 10/21/2023 1707 by Rosalio Macadamia, RN Outcome: Progressing   Problem: Safety: Goal: Ability to remain free from injury will improve 10/21/2023 1708 by Rosalio Macadamia, RN Outcome: Adequate for Discharge 10/21/2023 1707 by Rosalio Macadamia, RN Outcome: Progressing   Problem: Skin Integrity: Goal: Risk for impaired skin integrity will decrease 10/21/2023 1708 by Rosalio Macadamia, RN Outcome: Adequate for Discharge 10/21/2023 1707 by Rosalio Macadamia, RN Outcome: Progressing   Problem: Education: Goal: Knowledge of the prescribed therapeutic regimen will improve 10/21/2023 1708 by Rosalio Macadamia, RN Outcome: Adequate for Discharge 10/21/2023 1707 by Rosalio Macadamia, RN Outcome: Progressing   Problem: Coping: Goal: Ability to identify and develop effective coping behavior will improve 10/21/2023 1708 by Rosalio Macadamia, RN Outcome: Adequate for Discharge 10/21/2023 1707 by Rosalio Macadamia, RN Outcome: Progressing   Problem: Clinical Measurements: Goal: Quality of life will improve 10/21/2023 1708 by Rosalio Macadamia, RN Outcome: Adequate for Discharge 10/21/2023 1707 by Rosalio Macadamia, RN Outcome:  Progressing   Problem: Respiratory: Goal: Verbalizations of increased ease of respirations will increase 10/21/2023 1708 by Rosalio Macadamia, RN Outcome: Adequate for Discharge 10/21/2023 1707 by Rosalio Macadamia, RN Outcome: Progressing  Problem: Role Relationship: Goal: Family's ability to cope with current situation will improve 10/21/2023 1708 by Rosalio Macadamia, RN Outcome: Adequate for Discharge 10/21/2023 1707 by Rosalio Macadamia, RN Outcome: Progressing Goal: Ability to verbalize concerns, feelings, and thoughts to partner or family member will improve 10/21/2023 1708 by Rosalio Macadamia, RN Outcome: Adequate for Discharge 10/21/2023 1707 by Rosalio Macadamia, RN Outcome: Progressing   Problem: Pain Management: Goal: Satisfaction with pain management regimen will improve 10/21/2023 1708 by Rosalio Macadamia, RN Outcome: Adequate for Discharge 10/21/2023 1707 by Rosalio Macadamia, RN Outcome: Progressing

## 2023-10-21 NOTE — Progress Notes (Signed)
[facility] Progress Note   Subjective:  Only 75 mL urine recorded  over 11/28.  Renal function continues to worsen-  I see note form hospitalist who indicates patient will be transitioned to comfort care given his stance of not wanting dialysis   --------------- Background on consult:   72 y.o. male with a history of CKD, prior left nephrectomy, CAD, COPD, hypertension, and MGUS who presented to the ER after a fall at home.  He felt dizzy before the fall.   He has an acute left femoral neck fracture.  He underwent left total hip arthroplasty on 11/25 with orthopedics.  Nephrology is consulted for assistance with management of AKI on CKD.  He follows with Dr. Allena Katz at [facility]. Earlier this afternoon, he had in/out cath with 800 mL at about 1:30pm - he had no urge to void.  He has previously required a foley.  He is ok with getting again if needed. He denies any NSAID use.Marland Kitchen  Has refused dialysis      Intake/Output Summary (Last 24 hours) at 10/21/2023 1027 Last data filed at 10/21/2023 0700 Gross per 24 hour  Intake 763.75 ml  Output 75 ml  Net 688.75 ml    Vitals:  Vitals:   10/20/23 2050 10/21/23 0057 10/21/23 0400 10/21/23 0800  BP: 110/62 112/64  (!) 88/56  Pulse: 74 72  70  Resp: 17 16  20   Temp:  98.1 F (36.7 C) 99.2 F (37.3 C) 99.3 F (37.4 C)  TempSrc: Oral Oral Oral Oral  SpO2: 97% 100%    Weight:      Height:         Physical Exam:   General: elderly male in bed altered  HEENT: NCAT Eyes: EOMI sclera anicteric Neck: supple trachea midline  Heart: S1S2 no rub Lungs: tachypnea; he improves to being able to speak after a nebulizer  Abdomen: soft/nt/nd Extremities: no edema; no cyanosis or clubbing Skin: no rash on extremities exposed Neuro: altered provides his name and intermittently answers questions  GU: has foley catheter with minimal urine   Medications reviewed   Labs:     Latest Ref Rng & Units 10/21/2023    4:34 AM 10/20/2023    1:17  PM 10/20/2023    5:21 AM  BMP  Glucose 70 - 99 mg/dL 61  77  98   BUN 8 - 23 mg/dL 83  75  71   Creatinine 0.61 - 1.24 mg/dL 2.13  0.86  5.78   Sodium 135 - 145 mmol/L 137  134  132   Potassium 3.5 - 5.1 mmol/L 5.2  5.1  5.7   Chloride 98 - 111 mmol/L 103  101  102   CO2 22 - 32 mmol/L 20  19  22    Calcium 8.9 - 10.3 mg/dL 7.6  7.9  7.8      Assessment/Plan:   # AKI  - Multifactorial.  Urinary retention noted.  Given his history he may also have pre-renal as well as ischemic insults, too - he had perioperative hypotension noted on 11/25.  Note dizziness preceded his fall.  Renal US with left nephrectomy and negative for right hydro - unable to obtain bladder scan on last attempt per charting  - Worsening.  He has indicated that he would never want dialysis and I spoke with his wife at bedside and his daughter via phone.  Would recommend palliative care consult and would recommend a transition to comfort-focused measures   -  has ben done per hospitalist  - Avoid hypotension  - Caution with fluids and oligoanuric AKI - spoke with primary team. Would discontinue if he does not tolerate from a respiratory standpoint  - Continue foley catheter.     # CKD stage IV - baseline Cr 3.55 on labs this summer at [facility] - he is also s/p left nephrectomy so has decreased renal reserve - Follows with [facility] - Dr. Allena Katz   # HTN - hx of such  - Note hypotension on 11/25    # Hyperkalemia - Changed to renal diet - s/p lokelma once  - sodium bicarbonate 1 amp IV once now  - can give lokelma if alert enough to do so  PRN   # Acute left femoral neck fracture   - s/p left total hip arthroplasty on 11/25 with orthopedics. - Please avoid NSAID's   # Acute urinary retention - on flomax  - Continue foley catheter   # Anemia of acute blood loss - suspect some surgical losses.  Also in setting of CKD - IV iron once    I see transition to comfort care in the works-  renal will sign off,  call with any questions   Cecille Aver, MD 10/21/2023 10:27 AM

## 2023-10-21 NOTE — TOC Initial Note (Signed)
Transition of Care [facility]) - Initial/Assessment Note    Patient Details  Name: Brian Graham MRN: [id number] Date of Birth: 11/03/1951  [facility]) CM/SW Contact:    Mearl Latin, LCSW Phone Number: 10/21/2023, 10:15 AM  Clinical Narrative:                 CSW received consult for hospice facility placement. CSW met with spouse and daughter to offers choice and discuss locations. Family has decided on [facility] as first choice (then [place] or [place]). CSW sent referral to [facility] for review and Dennard Nip will review.   Expected Discharge Plan: Hospice Medical Facility Barriers to Discharge: Hospice Bed not available   Patient Goals and CMS Choice Patient states their goals for this hospitalization and ongoing recovery are:: Comfort CMS Medicare.gov Compare Post Acute Care list provided to:: Patient Represenative (must comment) (wife Claris Gower) Choice offered to / list presented to : Spouse      Expected Discharge Plan and Services In-house Referral: Clinical Social Work, Hospice / Palliative Care   Post Acute Care Choice: Residential Hospice Bed Living arrangements for the past 2 months: Single Family Home                                      Prior Living Arrangements/Services Living arrangements for the past 2 months: Single Family Home Lives with:: Spouse Patient language and need for interpreter reviewed:: Yes Do you feel safe going back to the place where you live?: Yes      Need for Family Participation in Patient Care: Yes (Comment) Care giver support system in place?: Yes (comment) Current home services: Other (comment) (none) Criminal Activity/Legal Involvement Pertinent to Current Situation/Hospitalization: No - Comment as needed  Activities of Daily Living   ADL Screening (condition at time of admission) Independently performs ADLs?: No Does the patient have a NEW difficulty with  bathing/dressing/toileting/self-feeding that is expected to last >3 days?: Yes (Initiates electronic notice to provider for possible OT consult) (broke left hip) Does the patient have a NEW difficulty with getting in/out of bed, walking, or climbing stairs that is expected to last >3 days?: Yes (Initiates electronic notice to provider for possible PT consult) (broke left hip) Does the patient have a NEW difficulty with communication that is expected to last >3 days?: No Is the patient deaf or have difficulty hearing?: Yes (can only hear out of right ear) Does the patient have difficulty seeing, even when wearing glasses/contacts?: No Does the patient have difficulty concentrating, remembering, or making decisions?: No  Permission Sought/Granted Permission sought to share information with : Family Supports Permission granted to share information with : Yes, Verbal Permission Granted  Share Information with NAME: wife Claris Gower  Permission granted to share info w AGENCY: SNF        Emotional Assessment Appearance:: Appears stated age Attitude/Demeanor/Rapport: Engaged Affect (typically observed): Appropriate, Pleasant Orientation: : Oriented to Self, Oriented to Place Alcohol / Substance Use: Not Applicable Psych Involvement: No (comment)  Admission diagnosis:  Hip fracture (HCC) [S72.009A] Patient Active Problem List   Diagnosis Date Noted   Hip fracture (HCC) 10/17/2023   Senile purpura (HCC) 05/03/2022   Vitamin D deficiency 01/20/2021   Vitamin B12 deficiency 01/20/2021   Hearing abnormally acute, left 04/03/2020   Healthcare maintenance 04/03/2020   Chronic kidney disease 08/13/2019   S/p nephrectomy  12/05/2018   MGUS (monoclonal gammopathy of unknown significance) 08/02/2018   Osteoporosis 06/26/2018   Perennial allergic rhinitis 01/24/2018   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   Constipation 09/03/2017   Anxiety 07/24/2017   Atherosclerosis of native arteries of  extremity with intermittent claudication (HCC) 07/06/2017   Pyelonephritis 04/23/2017   Weight loss 04/23/2017   History of adenomatous polyp of colon 04/22/2017   Aortic atherosclerosis (HCC) 04/13/2017   Hyperlipidemia 04/04/2017   Hypothyroidism 04/04/2017   Seasonal allergies 04/04/2017   Myalgia 10/04/2016   Former smoker 09/01/2016   COPD, 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   Dysuria 08/23/2016   Chronic pain of both knees 08/23/2016   Hx of CABG 08/05/2016   CAD (coronary artery disease) of artery bypass graft 08/05/2016   HTN (hypertension) 08/05/2016   Essential tremor 06/11/2016   Tension headache 06/11/2016   PCP:  Shelva Majestic, MD Pharmacy:   [facility] [phone] - [address], [place] - [address] [place] [address] Phone: [phone] Fax: [phone]  [facility] - [place], [place] - [address] [place] [address] Phone: [phone] Fax: [phone]  [facility] - [place] - [address] [place] [address] Phone: [phone] Fax: [phone]     Social Determinants of Health (SDOH) Social History: SDOH Screenings   Food Insecurity: No Food Insecurity (10/17/2023)  Housing: Low Risk  (10/17/2023)  Transportation Needs: No Transportation Needs (10/17/2023)  Utilities: Not At Risk (10/17/2023)  Depression (PHQ2-9): Low Risk  (05/09/2023)  Financial Resource Strain: Low Risk  (04/12/2022)  Physical Activity: Inactive (04/12/2022)  Social Connections: Moderately Isolated (04/12/2022)  Stress: No Stress Concern Present (04/12/2022)  Tobacco Use: Medium Risk (10/17/2023)   SDOH Interventions:     Readmission Risk Interventions     No data to display

## 2023-10-21 NOTE — Consult Note (Addendum)
Palliative Care Consult Note                                  Date: 10/21/2023   Patient Name: Brian Graham  DOB: [birth year]  MRN: [id number]  Age / Sex: 72 y.o., male  PCP: Shelva Majestic, MD Referring Physician: Maretta Bees, MD  Reason for Consultation: Establishing goals of care  HPI/Patient Profile: 72 y.o. male  with past medical history of CAD s/p bypass, CKD with history of nephrectomy, COPD, GERD, HTN, HLD, and hypothyroidism admitted on 10/17/2023 after a fall. He was found to have left hip fracture-underwent left hip total arthroplasty-unfortunately further hospital course complicated by oliguric AKI.   Past Medical History:  Diagnosis Date   Allergy 1980   Anxiety    Arthritis    "left knee" (08/05/2016)   Bruises easily    CAD (coronary artery disease) of artery bypass graft 08/05/2016   Around age 56. CABG - 3 vessel.    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 Arbutus Leas   Former smoker 09/01/2016   Quit 2012. 40 pack years at least.    GERD (gastroesophageal reflux disease)    Headache    History of blood transfusion 08/1997   "when he had his heart surgery"   History of shingles 1970-2013 X 3   HTN (hypertension) 08/05/2016   Amlodipine 5mg , atenolol 50mg  BID, benazepril 20mg    Hyperlipemia    Hypothyroidism    Lumbar disc disease    MGUS (monoclonal gammopathy of unknown significance)    Myocardial infarction (HCC) 08/1997   Peripheral vascular disease (HCC)    PONV (postoperative nausea and vomiting)     Urinary tract infection 09/03/2017   Wears glasses     Subjective:   I have reviewed medical records including EPIC notes, labs and imaging, assessed the patient and then met with the patient's wife and daughter at bedside to discuss diagnosis prognosis, GOC, EOL wishes, disposition and options. Patient did not participate in the discussion.  I introduced Palliative Medicine as specialized medical care for people living with serious illness. It focuses on providing relief from symptoms and stress of a serious illness. The goal is to improve quality of life for both the patient and the family.  Today's Discussion: Patient's wife and daughter at bedside. They have a good understanding of the patient's medical history and current prognosis. Although the patient has had a history of CKD, his progression seems quick to his daughter. We discussed his comorbidities and that his reserve has been low for some time. We also discussed the natural disease trajectory. The patient has always been very open with his family that he would not want dialysis. Quality of life was more important to him than quantity of life. He also told his wife that he is "tired of being sick."  A discussion was had today regarding advanced directives. Discussed concepts specific to code status and scope of care. This morning  while speaking with Dr. Jerral Ralph the patient's wife agreed to transitioning the patient to full comfort care. Explained that the patient would no longer receive aggressive medical interventions such as continuous vital signs, lab work, radiology testing, or medications not focused on comfort. All care would focus on how the patient looks and feeling. This would include management of any symptoms that may cause discomfort, pain, shortness of breath, cough, nausea, agitation, anxiety, and/or secretions etc. Symptoms would be managed with medications and other non-pharmacological interventions. Family verbalized  understanding and appreciation. Emotional support provided. They have already spoken with [facility] and are interested in inpatient hospice. We further discussed what hospice would look like for this patient and the process for transition to inpatient hospice including evaluation by hospice liaison.  Discussed the importance of continued conversation with family and the medical providers regarding overall plan of care and treatment options, ensuring decisions are within the context of the patient's values and GOCs.  Questions and concerns were addressed. Hard Choices booklet left for review. The family was encouraged to call with questions or concerns. PMT will continue to support holistically.  Addendum: Patient has been accepted to [facility]. Plan is to transfer this evening. Answered questions for patient's wife and sister. Checked on patient. He has no questions or concerns. Reports no symptoms at this time.  Review of Systems  Unable to perform ROS   Objective:   Primary Diagnoses: Present on Admission:  Hip fracture (HCC)  Hyperlipidemia  MGUS (monoclonal gammopathy of unknown significance)   Physical Exam Vitals reviewed.  Constitutional:      General: He is sleeping.     Appearance: He is ill-appearing.     Interventions: Nasal cannula in place.  HENT:     Head: Normocephalic and atraumatic. Head contusion: cough.  Cardiovascular:     Rate and Rhythm: Normal rate.  Pulmonary:     Effort: Pulmonary effort is normal.  Skin:    General: Skin is warm and dry.  Neurological:     Mental Status: He is easily aroused.     Vital Signs:  BP (!) 88/56 (BP Location: Left Arm)   Pulse 70   Temp 99.3 F (37.4 C) (Oral)   Resp 20   Ht 5\' 9"  (1.753 m)   Wt 70.2 kg   SpO2 100%   BMI 22.85 kg/m    Advanced Care Planning:   Existing Vynca/ACP Documentation: None  Primary Decision Maker: Patient's wife Saige Angell  Code Status/Advance Care  Planning: DNR   Assessment & Plan:   SUMMARY OF RECOMMENDATIONS   DNR- Full comfort care Evaluation for inpatient hospice Comfort medications Continued PMT support  Symptom Management:  Added prn robinul for secretions  Prognosis:  < 2 weeks  Discharge Planning:  To Be Determined: Family would like inpatient hospice  Discussed with: Dr. Jerral Ralph   Thank you for allowing [place] to participate in the care of Raith Salway PMT will continue to support holistically.  Time Total: 75 minutes   Signed by: Sarina Ser, NP Palliative Medicine Team  Team Phone # [phone] (Nights/Weekends)  10/21/2023, 10:54 AM

## 2023-10-21 NOTE — Progress Notes (Signed)
Report given to nurse Annice Pih at [facility], [phone].

## 2023-10-21 NOTE — Progress Notes (Signed)
Brief Note  Goals of care discussion done with spouse-renal function continues to worsen-only 75 cc of urine output overnight. Plan is to transition to full comfort measures Daughter on the way to the hospital-family will decide whether to do residential hospice at home hospice-they want to talk with the social worker regarding hospice options/cost etc.  I have talked with our [facility] team.   While disposition discussions are underway-plan is to start comfort measures in the hospital.  Full note to follow

## 2023-10-21 NOTE — TOC Transition Note (Signed)
Transition of Care [facility]) - CM/SW Discharge Note   Patient Details  Name: Brian Graham MRN: [id number] Date of Birth: 1951/11/19  [facility] CM/SW Contact:  Mearl Latin, LCSW Phone Number: 10/21/2023, 2:55 PM   Clinical Narrative:    [facility] Anticipated DC date: 10/21/23 Family notified: Spouse Transport by: Sharin Mons    Per MD patient ready for DC to [facility]. RN to call report prior to discharge [phone]). RN, patient, patient's family, and facility notified of DC. Discharge Summary sent to facility. DC packet on chart including signed DNR. Ambulance transport requested for patient.   CSW will sign off for now as social work intervention is no longer needed. Please consult [place] again if new needs arise.     Final next level of care: Hospice Medical Facility Barriers to Discharge: Hospice Bed not available   Patient Goals and CMS Choice CMS Medicare.gov Compare Post Acute Care list provided to:: Patient Represenative (must comment) (wife Claris Gower) Choice offered to / list presented to : Spouse  Discharge Placement                  Patient to be transferred to facility by: [facility] Name of family member notified: Spouse Patient and family notified of of transfer: 10/21/23  Discharge Plan and Services Additional resources added to the After Visit Summary for   In-house Referral: Clinical Social Work, Hospice / Palliative Care   Post Acute Care Choice: Residential Hospice Bed                               Social Determinants of Health (SDOH) Interventions SDOH Screenings   Food Insecurity: No Food Insecurity (10/17/2023)  Housing: Low Risk  (10/17/2023)  Transportation Needs: No Transportation Needs (10/17/2023)  Utilities: Not At Risk (10/17/2023)  Depression (PHQ2-9): Low Risk  (05/09/2023)  Financial Resource Strain: Low Risk  (04/12/2022)  Physical Activity: Inactive (04/12/2022)   Social Connections: Moderately Isolated (04/12/2022)  Stress: No Stress Concern Present (04/12/2022)  Tobacco Use: Medium Risk (10/17/2023)     Readmission Risk Interventions     No data to display

## 2023-10-23 DEATH — deceased

## 2023-10-24 ENCOUNTER — Telehealth: Payer: Self-pay | Admitting: Family Medicine

## 2023-10-24 NOTE — Telephone Encounter (Signed)
FYI

## 2023-10-24 NOTE — Telephone Encounter (Signed)
Can you have him marked as deceased  Called wife and offered condolences

## 2023-10-24 NOTE — Telephone Encounter (Signed)
Pt has been marked deceased

## 2023-10-24 NOTE — Telephone Encounter (Signed)
Patient's wife called to let Dr. Durene Cal know that Patient passed away 11-11-23

## 2023-10-25 NOTE — Addendum Note (Signed)
Addendum  created 10/25/23 [id number] by Mariann Barter, MD   Intraprocedure Staff edited

## 2023-10-31 ENCOUNTER — Ambulatory Visit (HOSPITAL_COMMUNITY): Admit: 2023-10-31 | Payer: Medicare HMO | Admitting: Internal Medicine

## 2023-10-31 ENCOUNTER — Encounter (HOSPITAL_COMMUNITY): Payer: Self-pay

## 2023-10-31 SURGERY — ESOPHAGOGASTRODUODENOSCOPY (EGD) WITH PROPOFOL
Anesthesia: Monitor Anesthesia Care

## 2023-11-08 ENCOUNTER — Ambulatory Visit: Payer: Medicare HMO | Admitting: Family Medicine
